# Patient Record
Sex: Female | Born: 1940
Health system: Southern US, Community
[De-identification: ages and names within clinical notes are randomized; demographics above are authoritative.]

## PROBLEM LIST (undated history)

## (undated) DIAGNOSIS — B259 Cytomegaloviral disease, unspecified: Secondary | ICD-10-CM

## (undated) DIAGNOSIS — K589 Irritable bowel syndrome without diarrhea: Secondary | ICD-10-CM

## (undated) DIAGNOSIS — K760 Fatty (change of) liver, not elsewhere classified: Secondary | ICD-10-CM

## (undated) DIAGNOSIS — E785 Hyperlipidemia, unspecified: Secondary | ICD-10-CM

## (undated) DIAGNOSIS — I499 Cardiac arrhythmia, unspecified: Secondary | ICD-10-CM

## (undated) DIAGNOSIS — K635 Polyp of colon: Secondary | ICD-10-CM

## (undated) DIAGNOSIS — E119 Type 2 diabetes mellitus without complications: Secondary | ICD-10-CM

## (undated) DIAGNOSIS — C4491 Basal cell carcinoma of skin, unspecified: Secondary | ICD-10-CM

## (undated) DIAGNOSIS — K859 Acute pancreatitis without necrosis or infection, unspecified: Secondary | ICD-10-CM

## (undated) DIAGNOSIS — K649 Unspecified hemorrhoids: Secondary | ICD-10-CM

## (undated) DIAGNOSIS — M810 Age-related osteoporosis without current pathological fracture: Secondary | ICD-10-CM

## (undated) DIAGNOSIS — Z83719 Family history of colon polyps, unspecified: Secondary | ICD-10-CM

## (undated) DIAGNOSIS — C801 Malignant (primary) neoplasm, unspecified: Secondary | ICD-10-CM

## (undated) DIAGNOSIS — Z8371 Family history of colonic polyps: Secondary | ICD-10-CM

## (undated) DIAGNOSIS — I10 Essential (primary) hypertension: Secondary | ICD-10-CM

## (undated) DIAGNOSIS — R011 Cardiac murmur, unspecified: Secondary | ICD-10-CM

## (undated) DIAGNOSIS — K2980 Duodenitis without bleeding: Secondary | ICD-10-CM

## (undated) DIAGNOSIS — K219 Gastro-esophageal reflux disease without esophagitis: Secondary | ICD-10-CM

## (undated) DIAGNOSIS — M1990 Unspecified osteoarthritis, unspecified site: Secondary | ICD-10-CM

## (undated) HISTORY — DX: Gastro-esophageal reflux disease without esophagitis: K21.9

## (undated) HISTORY — DX: Family history of colon polyps, unspecified: Z83.719

## (undated) HISTORY — PX: COLONOSCOPY: SHX174

## (undated) HISTORY — PX: ABDOMINAL HYSTERECTOMY: SHX81

## (undated) HISTORY — DX: Type 2 diabetes mellitus without complications: E11.9

## (undated) HISTORY — DX: Family history of colonic polyps: Z83.71

## (undated) HISTORY — DX: Irritable bowel syndrome, unspecified: K58.9

## (undated) HISTORY — PX: EYE SURGERY: SHX253

## (undated) HISTORY — DX: Hyperlipidemia, unspecified: E78.5

## (undated) HISTORY — PX: ESOPHAGOGASTRODUODENOSCOPY: SHX1529

## (undated) HISTORY — PX: OTHER SURGICAL HISTORY: SHX169

## (undated) HISTORY — DX: Basal cell carcinoma of skin, unspecified: C44.91

---

## 1976-11-10 HISTORY — PX: TUBAL LIGATION: SHX77

## 1978-11-10 HISTORY — PX: OTHER SURGICAL HISTORY: SHX169

## 1986-10-10 HISTORY — PX: OTHER SURGICAL HISTORY: SHX169

## 2004-12-20 ENCOUNTER — Ambulatory Visit: Payer: Self-pay | Admitting: Internal Medicine

## 2005-02-11 ENCOUNTER — Ambulatory Visit: Payer: Self-pay | Admitting: Internal Medicine

## 2005-02-17 ENCOUNTER — Ambulatory Visit: Payer: Self-pay | Admitting: Internal Medicine

## 2005-06-19 ENCOUNTER — Ambulatory Visit: Payer: Self-pay | Admitting: Internal Medicine

## 2005-10-21 ENCOUNTER — Ambulatory Visit: Payer: Self-pay | Admitting: Gastroenterology

## 2006-01-28 ENCOUNTER — Ambulatory Visit: Payer: Self-pay | Admitting: Internal Medicine

## 2006-02-18 ENCOUNTER — Ambulatory Visit: Payer: Self-pay | Admitting: Internal Medicine

## 2006-07-31 ENCOUNTER — Ambulatory Visit: Payer: Self-pay | Admitting: Internal Medicine

## 2006-09-15 ENCOUNTER — Ambulatory Visit: Payer: Self-pay | Admitting: Internal Medicine

## 2006-12-30 ENCOUNTER — Ambulatory Visit: Payer: Self-pay | Admitting: Internal Medicine

## 2006-12-31 LAB — CONVERTED CEMR LAB: Hgb A1c MFr Bld: 6.4 % — ABNORMAL HIGH (ref 4.6–6.0)

## 2007-01-09 HISTORY — PX: OTHER SURGICAL HISTORY: SHX169

## 2007-01-26 ENCOUNTER — Ambulatory Visit: Payer: Self-pay | Admitting: Internal Medicine

## 2007-01-26 LAB — CONVERTED CEMR LAB
Albumin: 4.1 g/dL (ref 3.5–5.2)
BUN: 12 mg/dL (ref 6–23)
Creatinine,U: 137.4 mg/dL
GFR calc non Af Amer: 89 mL/min
LDL Cholesterol: 90 mg/dL (ref 0–99)
Phosphorus: 4 mg/dL (ref 2.3–4.6)
Potassium: 3.9 meq/L (ref 3.5–5.1)
Total CHOL/HDL Ratio: 3.4

## 2007-01-27 ENCOUNTER — Ambulatory Visit: Payer: Self-pay

## 2007-02-24 ENCOUNTER — Ambulatory Visit: Payer: Self-pay | Admitting: Internal Medicine

## 2007-07-20 DIAGNOSIS — E119 Type 2 diabetes mellitus without complications: Secondary | ICD-10-CM | POA: Insufficient documentation

## 2007-07-20 DIAGNOSIS — E785 Hyperlipidemia, unspecified: Secondary | ICD-10-CM | POA: Insufficient documentation

## 2007-07-20 DIAGNOSIS — K589 Irritable bowel syndrome without diarrhea: Secondary | ICD-10-CM | POA: Insufficient documentation

## 2007-07-29 ENCOUNTER — Ambulatory Visit: Payer: Self-pay | Admitting: Internal Medicine

## 2007-07-29 LAB — CONVERTED CEMR LAB
Albumin: 4.2 g/dL (ref 3.5–5.2)
GFR calc Af Amer: 129 mL/min
GFR calc non Af Amer: 106 mL/min
LDL Cholesterol: 90 mg/dL (ref 0–99)
Phosphorus: 5.4 mg/dL — ABNORMAL HIGH (ref 2.3–4.6)
Sodium: 141 meq/L (ref 135–145)
Total CHOL/HDL Ratio: 3.3
Triglycerides: 142 mg/dL (ref 0–149)
VLDL: 28 mg/dL (ref 0–40)

## 2007-09-10 ENCOUNTER — Ambulatory Visit: Payer: Self-pay | Admitting: Internal Medicine

## 2007-12-14 ENCOUNTER — Ambulatory Visit: Payer: Self-pay | Admitting: Internal Medicine

## 2007-12-14 DIAGNOSIS — R002 Palpitations: Secondary | ICD-10-CM | POA: Insufficient documentation

## 2007-12-27 ENCOUNTER — Ambulatory Visit: Payer: Self-pay

## 2007-12-28 ENCOUNTER — Encounter: Payer: Self-pay | Admitting: Internal Medicine

## 2008-02-03 ENCOUNTER — Ambulatory Visit: Payer: Self-pay | Admitting: Internal Medicine

## 2008-02-04 LAB — CONVERTED CEMR LAB
AST: 32 units/L (ref 0–37)
Albumin: 4.3 g/dL (ref 3.5–5.2)
Alkaline Phosphatase: 56 units/L (ref 39–117)
BUN: 12 mg/dL (ref 6–23)
Basophils Absolute: 0.1 10*3/uL (ref 0.0–0.1)
Basophils Relative: 0.6 % (ref 0.0–1.0)
Bilirubin, Direct: 0.1 mg/dL (ref 0.0–0.3)
CO2: 31 meq/L (ref 19–32)
Calcium: 9.9 mg/dL (ref 8.4–10.5)
Cholesterol: 179 mg/dL (ref 0–200)
Creatinine, Ser: 0.7 mg/dL (ref 0.4–1.2)
Glucose, Bld: 186 mg/dL — ABNORMAL HIGH (ref 70–99)
LDL Cholesterol: 89 mg/dL (ref 0–99)
Lymphocytes Relative: 22.6 % (ref 12.0–46.0)
MCHC: 34.5 g/dL (ref 30.0–36.0)
Neutrophils Relative %: 68.1 % (ref 43.0–77.0)
RBC: 4.99 M/uL (ref 3.87–5.11)
Total Bilirubin: 1 mg/dL (ref 0.3–1.2)
VLDL: 39 mg/dL (ref 0–40)

## 2008-02-17 ENCOUNTER — Ambulatory Visit: Payer: Self-pay | Admitting: Internal Medicine

## 2008-02-25 ENCOUNTER — Encounter: Payer: Self-pay | Admitting: Internal Medicine

## 2008-07-10 ENCOUNTER — Ambulatory Visit: Payer: Self-pay | Admitting: Internal Medicine

## 2008-09-08 ENCOUNTER — Ambulatory Visit: Payer: Self-pay | Admitting: Internal Medicine

## 2008-09-08 DIAGNOSIS — K219 Gastro-esophageal reflux disease without esophagitis: Secondary | ICD-10-CM | POA: Insufficient documentation

## 2008-09-12 LAB — CONVERTED CEMR LAB
ALT: 40 units/L — ABNORMAL HIGH (ref 0–35)
AST: 44 units/L — ABNORMAL HIGH (ref 0–37)
Albumin: 4.4 g/dL (ref 3.5–5.2)
Alkaline Phosphatase: 57 units/L (ref 39–117)
BUN: 11 mg/dL (ref 6–23)
Bilirubin, Direct: 0.1 mg/dL (ref 0.0–0.3)
CO2: 31 meq/L (ref 19–32)
Calcium: 9.9 mg/dL (ref 8.4–10.5)
Chloride: 100 meq/L (ref 96–112)
Cholesterol: 171 mg/dL (ref 0–200)
Creatinine, Ser: 0.6 mg/dL (ref 0.4–1.2)
Creatinine,U: 40.2 mg/dL
GFR calc Af Amer: 128 mL/min
GFR calc non Af Amer: 106 mL/min
Glucose, Bld: 182 mg/dL — ABNORMAL HIGH (ref 70–99)
HDL: 47.6 mg/dL (ref >39.0)
Hgb A1c MFr Bld: 8.8 % — ABNORMAL HIGH (ref 4.6–6.0)
LDL Cholesterol: 90 mg/dL (ref 0–99)
Microalb Creat Ratio: 14.9 mg/g (ref 0.0–30.0)
Microalb, Ur: 0.6 mg/dL (ref 0.0–1.9)
Phosphorus: 5.3 mg/dL — ABNORMAL HIGH (ref 2.3–4.6)
Potassium: 4.2 meq/L (ref 3.5–5.1)
Sodium: 141 meq/L (ref 135–145)
Total Bilirubin: 1.1 mg/dL (ref 0.3–1.2)
Total CHOL/HDL Ratio: 3.6
Total Protein: 7.3 g/dL (ref 6.0–8.3)
Triglycerides: 168 mg/dL — ABNORMAL HIGH (ref 0–149)
VLDL: 34 mg/dL (ref 0–40)

## 2008-09-25 ENCOUNTER — Ambulatory Visit: Payer: Self-pay | Admitting: Internal Medicine

## 2009-02-12 ENCOUNTER — Ambulatory Visit: Payer: Self-pay | Admitting: Internal Medicine

## 2009-02-14 LAB — CONVERTED CEMR LAB
ALT: 16 units/L (ref 0–35)
BUN: 17 mg/dL (ref 6–23)
Basophils Absolute: 0 10*3/uL (ref 0.0–0.1)
Bilirubin, Direct: 0.1 mg/dL (ref 0.0–0.3)
CO2: 30 meq/L (ref 19–32)
Eosinophils Absolute: 0.2 10*3/uL (ref 0.0–0.7)
GFR calc non Af Amer: 88.5 mL/min (ref >60)
Glucose, Bld: 96 mg/dL (ref 70–99)
HCT: 44.4 % (ref 36.0–46.0)
Hemoglobin: 15.7 g/dL — ABNORMAL HIGH (ref 12.0–15.0)
Hgb A1c MFr Bld: 6.1 % (ref 4.6–6.5)
Lymphs Abs: 2.4 10*3/uL (ref 0.7–4.0)
MCHC: 35.3 g/dL (ref 30.0–36.0)
MCV: 92.4 fL (ref 78.0–100.0)
Monocytes Absolute: 0.7 10*3/uL (ref 0.1–1.0)
Monocytes Relative: 8.5 % (ref 3.0–12.0)
Neutro Abs: 5.4 10*3/uL (ref 1.4–7.7)
Platelets: 251 10*3/uL (ref 150.0–400.0)
Potassium: 4.8 meq/L (ref 3.5–5.1)
RDW: 12.8 % (ref 11.5–14.6)
Sodium: 141 meq/L (ref 135–145)
Total Bilirubin: 0.9 mg/dL (ref 0.3–1.2)
Total CHOL/HDL Ratio: 3
VLDL: 22.6 mg/dL (ref 0.0–40.0)

## 2009-03-01 ENCOUNTER — Encounter: Payer: Self-pay | Admitting: Internal Medicine

## 2009-03-07 ENCOUNTER — Encounter: Payer: Self-pay | Admitting: Internal Medicine

## 2009-04-03 ENCOUNTER — Ambulatory Visit: Payer: Self-pay | Admitting: Family Medicine

## 2009-04-03 DIAGNOSIS — M109 Gout, unspecified: Secondary | ICD-10-CM | POA: Insufficient documentation

## 2009-04-23 ENCOUNTER — Telehealth: Payer: Self-pay | Admitting: Family Medicine

## 2009-05-04 ENCOUNTER — Telehealth (INDEPENDENT_AMBULATORY_CARE_PROVIDER_SITE_OTHER): Payer: Self-pay | Admitting: Internal Medicine

## 2009-05-16 ENCOUNTER — Telehealth (INDEPENDENT_AMBULATORY_CARE_PROVIDER_SITE_OTHER): Payer: Self-pay | Admitting: Internal Medicine

## 2009-08-15 ENCOUNTER — Ambulatory Visit: Payer: Self-pay | Admitting: Internal Medicine

## 2009-08-16 LAB — CONVERTED CEMR LAB
Alkaline Phosphatase: 48 units/L (ref 39–117)
BUN: 14 mg/dL (ref 6–23)
Basophils Absolute: 0 10*3/uL (ref 0.0–0.1)
Bilirubin, Direct: 0 mg/dL (ref 0.0–0.3)
CO2: 27 meq/L (ref 19–32)
Chloride: 98 meq/L (ref 96–112)
Cholesterol: 157 mg/dL (ref 0–200)
Creatinine, Ser: 0.6 mg/dL (ref 0.4–1.2)
Eosinophils Relative: 1.8 % (ref 0.0–5.0)
GFR calc non Af Amer: 105.58 mL/min (ref >60)
HCT: 46.6 % — ABNORMAL HIGH (ref 36.0–46.0)
Hemoglobin: 16 g/dL — ABNORMAL HIGH (ref 12.0–15.0)
LDL Cholesterol: 73 mg/dL (ref 0–99)
Lymphs Abs: 2.4 10*3/uL (ref 0.7–4.0)
MCV: 92.4 fL (ref 78.0–100.0)
Monocytes Absolute: 0.7 10*3/uL (ref 0.1–1.0)
Monocytes Relative: 8.1 % (ref 3.0–12.0)
Neutro Abs: 5.7 10*3/uL (ref 1.4–7.7)
Platelets: 248 10*3/uL (ref 150.0–400.0)
Potassium: 4.3 meq/L (ref 3.5–5.1)
RDW: 12.7 % (ref 11.5–14.6)
Total Bilirubin: 0.7 mg/dL (ref 0.3–1.2)
Total CHOL/HDL Ratio: 3
Total Protein: 7.4 g/dL (ref 6.0–8.3)
Uric Acid, Serum: 6.5 mg/dL (ref 2.4–7.0)
VLDL: 33.8 mg/dL (ref 0.0–40.0)

## 2009-08-30 ENCOUNTER — Ambulatory Visit: Payer: Self-pay | Admitting: Internal Medicine

## 2009-08-30 DIAGNOSIS — J019 Acute sinusitis, unspecified: Secondary | ICD-10-CM | POA: Insufficient documentation

## 2009-08-30 DIAGNOSIS — G479 Sleep disorder, unspecified: Secondary | ICD-10-CM | POA: Insufficient documentation

## 2009-12-05 ENCOUNTER — Ambulatory Visit: Payer: Self-pay | Admitting: Family Medicine

## 2010-02-05 ENCOUNTER — Telehealth: Payer: Self-pay | Admitting: Family Medicine

## 2010-03-26 ENCOUNTER — Telehealth: Payer: Self-pay | Admitting: Family Medicine

## 2010-03-26 ENCOUNTER — Ambulatory Visit: Payer: Self-pay | Admitting: Family Medicine

## 2010-03-29 ENCOUNTER — Encounter: Payer: Self-pay | Admitting: Family Medicine

## 2010-04-10 ENCOUNTER — Encounter: Payer: Self-pay | Admitting: Family Medicine

## 2010-04-26 ENCOUNTER — Telehealth: Payer: Self-pay | Admitting: Family Medicine

## 2010-05-24 ENCOUNTER — Telehealth: Payer: Self-pay | Admitting: Family Medicine

## 2010-06-24 ENCOUNTER — Telehealth: Payer: Self-pay | Admitting: Family Medicine

## 2010-07-04 ENCOUNTER — Ambulatory Visit: Payer: Self-pay | Admitting: Internal Medicine

## 2010-07-05 ENCOUNTER — Ambulatory Visit: Payer: Self-pay | Admitting: Family Medicine

## 2010-07-22 ENCOUNTER — Telehealth: Payer: Self-pay | Admitting: Family Medicine

## 2010-07-24 ENCOUNTER — Ambulatory Visit: Payer: Self-pay | Admitting: Family Medicine

## 2010-07-29 ENCOUNTER — Telehealth: Payer: Self-pay | Admitting: Family Medicine

## 2010-08-21 ENCOUNTER — Telehealth: Payer: Self-pay | Admitting: Family Medicine

## 2010-09-23 ENCOUNTER — Telehealth: Payer: Self-pay | Admitting: Family Medicine

## 2010-10-01 ENCOUNTER — Ambulatory Visit: Payer: Self-pay | Admitting: Family Medicine

## 2010-10-01 DIAGNOSIS — M546 Pain in thoracic spine: Secondary | ICD-10-CM | POA: Insufficient documentation

## 2010-10-01 DIAGNOSIS — G8929 Other chronic pain: Secondary | ICD-10-CM | POA: Insufficient documentation

## 2010-10-16 ENCOUNTER — Telehealth: Payer: Self-pay | Admitting: Internal Medicine

## 2010-10-29 ENCOUNTER — Telehealth: Payer: Self-pay | Admitting: Family Medicine

## 2010-11-14 ENCOUNTER — Encounter: Payer: Self-pay | Admitting: Family Medicine

## 2010-11-26 ENCOUNTER — Telehealth: Payer: Self-pay | Admitting: Family Medicine

## 2010-11-28 ENCOUNTER — Telehealth: Payer: Self-pay | Admitting: Family Medicine

## 2010-12-10 NOTE — Assessment & Plan Note (Signed)
Summary: END OF NOV FOLLOW UP/[facility]   Vital Signs:  Patient profile:   70 year old female Height:      67 inches Weight:      180 pounds BMI:     28.29 Temp:     97.6 degrees F oral Pulse rate:   64 / minute Pulse rhythm:   regular BP sitting:   140 / 62  (right arm) Cuff size:   regular  Vitals Entered By: Linde Gillis CMA Duncan Dull) (October 01, 2010 9:46 AM) CC: follow up   History of Present Illness: 70 yo here to follow DM.    DM- CBGs have been much better..  has really been working on diet and exercise, going to the gym up to five times a week now.  a1c in August was 7.4 (improved from 7.8).  Fasting has not been above 120 lately.  Taking Metformin 1000 mg two times a day and Glipizide 10 mg daily ( increased from 5).  Denies any episodes of hypoglycemia.   Going to the gym with her daughter every day now until 3 weeks ago when shoulder blade started hurting. Pain is in mid upper back- radiates toward left shoulder blade.  Worse with movement. No problem lifting arm over head, no muscle weakness or radiculopathy. Unsure when last bone density scan was. No trauma.    Current Medications (verified): 1)  Lovastatin 40 Mg  Tabs (Lovastatin) .... Take One By Mouth Once A Day 2)  Multivitamins   Tabs (Multiple Vitamin) .... Take One By Mouth Once A Day 3)  Prilosec Otc 20 Mg  Tbec (Omeprazole Magnesium) .... Take One By Mouth Once A Day As Needed 4)  Fish Oil Concentrate 1000 Mg  Caps (Omega-3 Fatty Acids) .... Take 3 By Mouth Once Daily 5)  Caltrate 600+d 600-400 Mg-Unit  Tabs (Calcium Carbonate-Vitamin D) .... Take 3 By Mouth Once Daily 6)  Metoprolol Succinate 100 Mg Xr24h-Tab (Metoprolol Succinate) .... Take 1 By Mouth Once Daily 7)  Accu-Chek Softclix Lancet Dev  Misc (Lancet Devices) .... Use As Directed 8)  Accu-Chek Compact  Strp (Glucose Blood) .... Use As Directed 9)  Trazodone Hcl 50 Mg Tabs (Trazodone Hcl) .Marland Kitchen.. 1-2 Tabs At Bedtime To Help Sleep 10)  Glipizide Xl 10  Mg Tb24 (Glipizide) .Marland Kitchen.. 1 Tablet By Mouth Daily  Allergies: 1)  ! * Prevacid 2)  ! Codeine 3)  Sulfa  Past History:  Past Medical History: Last updated: 09/08/2008 Colonic polyps, hx of--adenomatous Diabetes mellitus, type II Hyperlipidemia Osteoporosis IBS GERD  Past Surgical History: Last updated: 08/17/07 Hysterectomy 1980 Tubal ligation 1978 Vaginal deliveries x 2 Cystocele, cystourethropexy 12/87 DEXA- increase T - 3.1 spine, normal femur 01/04 Carotids- mild only 03/08  Family History: Last updated: 08-17-2007 Father: Died at age 44, CHF, CAD Mother: Died at age 52, CHF, CAD (MI at 52) Siblings: One brother deceased ?? 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  Regular Exercise - no Drug Use - no  Risk Factors: Exercise: no (10/12/2009)  Risk Factors: Smoking Status: quit (10/12/2009)  Review of Systems      See HPI General:  Denies malaise. Eyes:  Denies blurring. CV:  Denies chest pain or discomfort. Resp:  Denies shortness of breath. GI:  Denies abdominal  pain and change in bowel habits. MS:  Complains of thoracic pain; denies loss of strength, low back pain, and muscle weakness.  Physical Exam  General:  alert.  NAD Mouth:  good dentition.   Lungs:  normal respiratory effort, normal breath sounds, no crackles, and no wheezes.   Heart:  normal rate, regular rhythm, no murmur, and no gallop.   Msk:  neg empty can. neg arch,left Psych:  normally interactive, good eye contact, not anxious appearing, and not depressed appearing.     Detailed Back/Spine Exam  Cervical Exam:  Inspection-deformity:    Normal Palpation-spinal tenderness:  Normal Range of Motion:    Forward Flexion:   60 degrees    Hyperextension:   75  degrees    Right Lat. Flexion:   45 degrees    Left Lat. Flexion:   45 degrees    Right Lat. Rotation:   80 degrees    Left Lat. Rotation:   80 degrees Spurling Maneuver:    negative Hoffman's Sign:    Right:  negative    Left:  negative  Thoracic Exam:  Inspection-deformity:    Normal Palpation-spinal tenderness:  Abnormal    some Tenderness of mid thoracic vertebra Sensory Exam/Pinprick:    Right:       T1:       normal       T2:       normal       T3:       normal       T4:       normal       T5:       normal       T6:       normal       T7:       normal       T8:       normal       T9:       normal       T10:      normal       T11:      normal       T12:      normal    Left:       T1:       normal       T2:       normal       T3:       normal       T4:       normal       T5:       normal       T6:       normal       T7:       normal       T8:       normal       T9:       normal       T10:      normal       T11:      normal       T12:      normal   Impression & Recommendations:  Problem # 1:  DIABETES MELLITUS, TYPE II (ICD-250.00) Assessment Unchanged recheck a1c today. The following medications were removed from the medication list:    Glucophage 1000 Mg Tabs (Metformin hcl) .Marland Kitchen... Take one by mouth two times a day Her updated medication list  for this problem includes:    Glipizide Xl 10 Mg Tb24 (Glipizide) .Marland Kitchen... 1 tablet by mouth daily    Metformin Hcl 1000 Mg Tabs (Metformin hcl) .Marland Kitchen... Take 1 tablet by mouth two times a day  Orders: Fingerstick (36416) TLB-A1C / Hgb A1C (Glycohemoglobin) (83036-A1C) Prescription Created Electronically [id number])  Problem # 2:  BACK PAIN, THORACIC REGION, LEFT (ICD-724.1) Assessment: New ?possible compression fracture.  Will get thoracic films today, if she does have a fracture, will get bone density scan. Orders: T-Thoracic Spine 2 Views [id number])  Complete Medication List: 1)  Lovastatin 40 Mg Tabs (Lovastatin)  .... Take one by mouth once a day 2)  Multivitamins Tabs (Multiple vitamin) .... Take one by mouth once a day 3)  Prilosec Otc 20 Mg Tbec (Omeprazole magnesium) .... Take one by mouth once a day as needed 4)  Fish Oil Concentrate 1000 Mg Caps (Omega-3 fatty acids) .... Take 3 by mouth once daily 5)  Caltrate 600+d 600-400 Mg-unit Tabs (Calcium carbonate-vitamin d) .... Take 3 by mouth once daily 6)  Metoprolol Succinate 100 Mg Xr24h-tab (Metoprolol succinate) .... Take 1 by mouth once daily 7)  Accu-chek Softclix Lancet Dev Misc (Lancet devices) .... Use as directed 8)  Accu-chek Compact Strp (Glucose blood) .... Use as directed 9)  Trazodone Hcl 50 Mg Tabs (Trazodone hcl) .Marland Kitchen.. 1-2 tabs at bedtime to help sleep 10)  Glipizide Xl 10 Mg Tb24 (Glipizide) .Marland Kitchen.. 1 tablet by mouth daily 11)  Metformin Hcl 1000 Mg Tabs (Metformin hcl) .... Take 1 tablet by mouth two times a day Prescriptions: METFORMIN HCL 1000 MG TABS (METFORMIN HCL) Take 1 tablet by mouth two times a day  #180 x 3   Entered and Authorized by:   Ruthe Mannan MD   Signed by:   Ruthe Mannan MD on 10/01/2010   Method used:   Electronically to        [facility]. [address]* (retail)       [address]       [place], [place]  [address]       Ph: [phone]       Fax: [phone]   RxID:   [id number]    Orders Added: 1)  Fingerstick [36416] 2)  TLB-A1C / Hgb A1C (Glycohemoglobin) [83036-A1C] 3)  T-Thoracic Spine 2 Views [72070TC] 4)  Prescription Created Electronically [[id number]] 5)  Est. Patient Level IV [56387]    Current Allergies (reviewed today): ! * PREVACID ! CODEINE SULFA

## 2010-12-10 NOTE — Progress Notes (Signed)
Summary: prior Berkley Harvey is needed for name brand glucophage  Phone Note From Pharmacy   Caller: [facility] [address]. #[id number]*/ [facility] Summary of Call: Prior Berkley Harvey is needed for name brand glucophage, form is on your desk. Initial call taken by: Lowella Petties CMA,  July 29, 2010 10:27 AM  Follow-up for Phone Call        In my box. Ruthe Mannan MD  July 29, 2010 10:31 AM   Additional Follow-up for Phone Call Additional follow up Details #1::        Form faxed. Additional Follow-up by: Lowella Petties CMA,  July 29, 2010 11:17 AM     Appended Document: prior Berkley Harvey is needed for name brand glucophage Prior auth given for glucophage, approval letter placed on doctors desk for signature and scanning.

## 2010-12-10 NOTE — Progress Notes (Signed)
Summary: Trazodone  Phone Note Refill Request Message from:  [facility] on April 26, 2010 8:19 AM  Refills Requested: Medication #1:  TRAZODONE HCL 50 MG TABS 1-2 tabs at bedtime to help sleep [facility] [address] #[id number]*   Last Fill Date:  03/26/2010   Pharmacy Phone:  [phone]   Method Requested: Telephone to Pharmacy Initial call taken by: Delilah Shan CMA (AAMA),  April 26, 2010 8:20 AM    Prescriptions: TRAZODONE HCL 50 MG TABS (TRAZODONE HCL) 1-2 tabs at bedtime to help sleep  #60.0 Each x 0   Entered and Authorized by:   Ruthe Mannan MD   Signed by:   Ruthe Mannan MD on 04/26/2010   Method used:   Electronically to        [facility]. [address] [phone]* (retail)       [address], [place]  [address]       Ph: [phone]       Fax: [phone]   RxID:   [id number]

## 2010-12-10 NOTE — Progress Notes (Signed)
Summary: Rx Trazodone  Phone Note Refill Request Call back at [phone] Message from:  [facility] on May 24, 2010 8:04 AM  Refills Requested: Medication #1:  TRAZODONE HCL 50 MG TABS 1-2 tabs at bedtime to help sleep   Last Refilled: 04/26/2010 Received E-script request please advise.   Method Requested: Telephone to Pharmacy Initial call taken by: Linde Gillis CMA Duncan Dull),  May 24, 2010 8:05 AM    Prescriptions: TRAZODONE HCL 50 MG TABS (TRAZODONE HCL) 1-2 tabs at bedtime to help sleep  #60.0 Each x 0   Entered and Authorized by:   Ruthe Mannan MD   Signed by:   Ruthe Mannan MD on 05/24/2010   Method used:   Electronically to        [facility]. [address] [phone]* (retail)       [address] [place]  [address]       Ph: [phone]       Fax: [phone]   RxID:   [id number]

## 2010-12-10 NOTE — Progress Notes (Signed)
Summary: needs order for mammogram  Phone Note Call from Patient Call back at Home Phone [phone]   Caller: Patient Summary of Call: Pt needs order for mammogram, last one was at [facility] 4/10. Initial call taken by: Lowella Petties CMA,  Mar 26, 2010 12:49 PM

## 2010-12-10 NOTE — Letter (Signed)
Summary: Generic Letter  [place] [facility]  [address] [place], [place] [address]   Phone: [phone]  Fax: [phone]    03/29/2010  Molly [name] [address] [place], [place]  [address]  Dear [name],    We have been unable to reach you by telephone.  Please call our office when you receive this letter and ask to speak with Dr. Elmer Sow nurse.            Sincerely,   Ruthe Mannan, MD

## 2010-12-10 NOTE — Assessment & Plan Note (Signed)
Summary: F/U PER DR ARON/CLE   Vital Signs:  Patient profile:   70 year old female Height:      67 inches Weight:      183 pounds BMI:     28.77 Temp:     97.6 degrees F oral Pulse rate:   67 / minute Pulse rhythm:   regular BP sitting:   124 / 60  (right arm) Cuff size:   regular  Vitals Entered By: Linde Gillis CMA Duncan Dull) (July 24, 2010 11:51 AM) CC: follow-up visit per Dr. Dayton Martes   History of Present Illness: 70 yo here to follow DM.    DM- CBGs at home have not been well controlled, checks once daily.  has really been working on diet and exercise, going to the gym up to five times a week now.  a1c last month was 7.4 (improved from 7.8).  Fasting has been as high as 200 lately.  Taking Glucophage 1000 mg two times a day and Glipizide 10 mg daily ( increased from 5).  Denies any episodes of hypoglycemia.  Last a1c in emr was in 10/10 was 7.0 (6.1 prior to that).   Current Medications (verified): 1)  Lovastatin 40 Mg  Tabs (Lovastatin) .... Take One By Mouth Once A Day 2)  Glucophage 1000 Mg  Tabs (Metformin Hcl) .... Take One By Mouth Two Times A Day 3)  Multivitamins   Tabs (Multiple Vitamin) .... Take One By Mouth Once A Day 4)  Prilosec Otc 20 Mg  Tbec (Omeprazole Magnesium) .... Take One By Mouth Once A Day As Needed 5)  Fish Oil Concentrate 1000 Mg  Caps (Omega-3 Fatty Acids) .... Take 3 By Mouth Once Daily 6)  Caltrate 600+d 600-400 Mg-Unit  Tabs (Calcium Carbonate-Vitamin D) .... Take 3 By Mouth Once Daily 7)  Metoprolol Succinate 100 Mg Xr24h-Tab (Metoprolol Succinate) .... Take 1 By Mouth Once Daily 8)  Accu-Chek Softclix Lancet Dev  Misc (Lancet Devices) .... Use As Directed 9)  Accu-Chek Compact  Strp (Glucose Blood) .... Use As Directed 10)  Trazodone Hcl 50 Mg Tabs (Trazodone Hcl) .Marland Kitchen.. 1-2 Tabs At Bedtime To Help Sleep 11)  Glipizide Xl 10 Mg Tb24 (Glipizide) .Marland Kitchen.. 1 Tablet By Mouth Daily  Allergies: 1)  ! * Prevacid 2)  ! Codeine 3)  Sulfa  Past  History:  Past Medical History: Last updated: 09/08/2008 Colonic polyps, hx of--adenomatous Diabetes mellitus, type II Hyperlipidemia Osteoporosis IBS GERD  Past Surgical History: Last updated: 08/06/2007 Hysterectomy 1980 Tubal ligation 1978 Vaginal deliveries x 2 Cystocele, cystourethropexy 12/87 DEXA- increase T - 3.1 spine, normal femur 01/04 Carotids- mild only 03/08  Family History: Last updated: 08-06-2007 Father: Died at age 46, CHF, CAD Mother: Died at age 65, CHF, CAD (MI at 66) Siblings: One brother deceased ?? 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  Regular Exercise - no Drug Use - no  Risk Factors: Exercise: no (10/12/2009)  Risk Factors: Smoking Status: quit (10/12/2009)  Review of Systems      See HPI General:  Denies malaise. Eyes:  Denies blurring. GI:  Denies nausea and vomiting. Endo:  Denies excessive thirst, excessive urination, and polyuria.  Physical Exam  General:  alert.  NAD Psych:  normally interactive, good eye contact, not anxious  appearing, and not depressed appearing.     Impression & Recommendations:  Problem # 1:  DIABETES MELLITUS, TYPE II (ICD-250.00) Assessment Unchanged  Time spent with patient 25 minutes, more than 50% of this time was spent counseling patient on didabetes.  Next step would be adding insulin but pt does not want to do that yet.  She wants to try trade name Glucophage and continue higher dose Glipizide with life style changes.  Will follow up in 2 months.    Her updated medication list for this problem includes:    Glucophage 1000 Mg Tabs (Metformin hcl) .Marland Kitchen... Take one by mouth two times a day    Glipizide Xl 10 Mg Tb24 (Glipizide) .Marland Kitchen... 1 tablet by mouth  daily  Orders: Prescription Created Electronically ([id number])  Complete Medication List: 1)  Lovastatin 40 Mg Tabs (Lovastatin) .... Take one by mouth once a day 2)  Glucophage 1000 Mg Tabs (Metformin hcl) .... Take one by mouth two times a day 3)  Multivitamins Tabs (Multiple vitamin) .... Take one by mouth once a day 4)  Prilosec Otc 20 Mg Tbec (Omeprazole magnesium) .... Take one by mouth once a day as needed 5)  Fish Oil Concentrate 1000 Mg Caps (Omega-3 fatty acids) .... Take 3 by mouth once daily 6)  Caltrate 600+d 600-400 Mg-unit Tabs (Calcium carbonate-vitamin d) .... Take 3 by mouth once daily 7)  Metoprolol Succinate 100 Mg Xr24h-tab (Metoprolol succinate) .... Take 1 by mouth once daily 8)  Accu-chek Softclix Lancet Dev Misc (Lancet devices) .... Use as directed 9)  Accu-chek Compact Strp (Glucose blood) .... Use as directed 10)  Trazodone Hcl 50 Mg Tabs (Trazodone hcl) .Marland Kitchen.. 1-2 tabs at bedtime to help sleep 11)  Glipizide Xl 10 Mg Tb24 (Glipizide) .Marland Kitchen.. 1 tablet by mouth daily  Patient Instructions: 1)  please come see me at end of November.   Prescriptions: GLUCOPHAGE 1000 MG  TABS (METFORMIN HCL) Take one by mouth two times a day  #60.0 Each x 11   Entered and Authorized by:   Ruthe Mannan MD   Signed by:   Ruthe Mannan MD on 07/24/2010   Method used:   Electronically to        [facility]. [address] [phone]* (retail)       [address] [place]  [address]       Ph: [phone]       Fax: [phone]   RxID:   [id number]   Current Allergies (reviewed today): ! * PREVACID ! CODEINE SULFA

## 2010-12-10 NOTE — Assessment & Plan Note (Signed)
Summary: 30 MIN F/U/CLE   Vital Signs:  Patient profile:   70 year old female Height:      67 inches Weight:      188.50 pounds BMI:     29.63 Temp:     98.0 degrees F oral Pulse rate:   76 / minute Pulse rhythm:   regular BP sitting:   140 / 70  (left arm) Cuff size:   regular  Vitals Entered By: Linde Gillis CMA Duncan Dull) (Mar 26, 2010 9:12 AM) CC: 30 minute follow up   History of Present Illness: 70 yo here to establish care with me Molly [name] pt).  DM- CBGs at home have not been well controlled, checks once daily.  Fasting has been as high as 200 lately.  Taking Glucophage 1000 mg two times a day and Glipizide 5 mg daily.  Denies any episodes of hypoglycemia.  Last a1c in emr was in 10/10 was 7.0 (6.1 prior to that). No increased stressors, feels like she is compliant with her diet. She and her daughter are joining the [employer]. Has lost 2 pounds since last office visit.  HTN- gets white coat HTN.  BP at home this morning was 128/72.  Prior BPs reviewed in emr.  She does have several normotensive readings as well. On metoprolol 100 mg daily.  No CP, SOB, LE edema, or blurred vision.  HLD- on lovastatin 40 mg with no issues.  Lipids well controlled at last check in 10/10.  Osteporosis- took fosamax for years, cannot remember when last DEXA scan was.  Taking Caltrate- 3 tabs daily.  Current Medications (verified): 1)  Lovastatin 40 Mg  Tabs (Lovastatin) .... Take One By Mouth Once A Day 2)  Glucophage 1000 Mg  Tabs (Metformin Hcl) .... Take One By Mouth Two Times A Day 3)  Multivitamins   Tabs (Multiple Vitamin) .... Take One By Mouth Once A Day 4)  Prilosec Otc 20 Mg  Tbec (Omeprazole Magnesium) .... Take One By Mouth Once A Day As Needed 5)  Fish Oil Concentrate 1000 Mg  Caps (Omega-3 Fatty Acids) .... Take 3 By Mouth Once Daily 6)  Caltrate 600+d 600-400 Mg-Unit  Tabs (Calcium Carbonate-Vitamin D) .... Take 3 By Mouth Once Daily 7)  Metoprolol Succinate 100 Mg Xr24h-Tab  (Metoprolol Succinate) .... Take 1 By Mouth Once Daily 8)  Glipizide 5 Mg Xr24h-Tab (Glipizide) .... Take 1 By Mouth Once Daily 9)  Accu-Chek Softclix Lancet Dev  Misc (Lancet Devices) .... Use As Directed 10)  Accu-Chek Compact  Strp (Glucose Blood) .... Use As Directed 11)  Trazodone Hcl 50 Mg Tabs (Trazodone Hcl) .Marland Kitchen.. 1-2 Tabs At Bedtime To Help Sleep  Allergies: 1)  ! * Prevacid 2)  ! Codeine 3)  Sulfa  Review of Systems      See HPI General:  Denies malaise. Eyes:  Denies discharge. ENT:  Denies difficulty swallowing. CV:  Denies chest pain or discomfort and difficulty breathing at night. Resp:  Denies shortness of breath. GI:  Denies abdominal pain and change in bowel habits. GU:  Denies incontinence and nocturia. MS:  Denies loss of strength. Derm:  Denies rash. Psych:  Denies anxiety and depression. Endo:  Denies cold intolerance, excessive hunger, excessive thirst, excessive urination, heat intolerance, and polyuria.  Physical Exam  General:  alert.  NAD Head:  normocephalic and atraumatic.   Mouth:  good dentition.   Lungs:  normal respiratory effort, normal breath sounds, no crackles, and no wheezes.   Heart:  normal  rate, regular rhythm, no murmur, and no gallop.   Abdomen:  soft and non-tender.   Extremities:  no edema Neurologic:  alert & oriented X3.   Psych:  normally interactive, good eye contact, not anxious appearing, and not depressed appearing.     Impression & Recommendations:  Problem # 1:  DIABETES MELLITUS, TYPE II (ICD-250.00) Assessment Deteriorated recheck a1c today.  Making good lifestyle changes, may need to increase change meds if a1c deteriorated. Her updated medication list for this problem includes:    Glucophage 1000 Mg Tabs (Metformin hcl) .Marland Kitchen... Take one by mouth two times a day    Glipizide 5 Mg Xr24h-tab (Glipizide) .Marland Kitchen... Take 1 by mouth once daily  Orders: Venipuncture (04540) TLB-A1C / Hgb A1C (Glycohemoglobin)  (83036-A1C)  Problem # 2:  OSTEOPOROSIS (SPINE ONLY) (ICD-733.00) Assessment: Unchanged Offered scheduling repeat DEXA today.  She wants to wait.  Will check Vit D today. Her updated medication list for this problem includes:    Caltrate 600+d 600-400 Mg-unit Tabs (Calcium carbonate-vitamin d) .Marland Kitchen... Take 3 by mouth once daily  Orders: T-Vitamin D (25-Hydroxy) ([id number])  Problem # 3:  HYPERLIPIDEMIA (ICD-272.4) Assessment: Unchanged Stable.  REcheck in October, continue current dose of Lovastatin. Her updated medication list for this problem includes:    Lovastatin 40 Mg Tabs (Lovastatin) .Marland Kitchen... Take one by mouth once a day  Complete Medication List: 1)  Lovastatin 40 Mg Tabs (Lovastatin) .... Take one by mouth once a day 2)  Glucophage 1000 Mg Tabs (Metformin hcl) .... Take one by mouth two times a day 3)  Multivitamins Tabs (Multiple vitamin) .... Take one by mouth once a day 4)  Prilosec Otc 20 Mg Tbec (Omeprazole magnesium) .... Take one by mouth once a day as needed 5)  Fish Oil Concentrate 1000 Mg Caps (Omega-3 fatty acids) .... Take 3 by mouth once daily 6)  Caltrate 600+d 600-400 Mg-unit Tabs (Calcium carbonate-vitamin d) .... Take 3 by mouth once daily 7)  Metoprolol Succinate 100 Mg Xr24h-tab (Metoprolol succinate) .... Take 1 by mouth once daily 8)  Glipizide 5 Mg Xr24h-tab (Glipizide) .... Take 1 by mouth once daily 9)  Accu-chek Softclix Lancet Dev Misc (Lancet devices) .... Use as directed 10)  Accu-chek Compact Strp (Glucose blood) .... Use as directed 11)  Trazodone Hcl 50 Mg Tabs (Trazodone hcl) .Marland Kitchen.. 1-2 tabs at bedtime to help sleep  Current Allergies (reviewed today): ! * PREVACID ! CODEINE SULFA  Appended Document: 30 MIN F/U/CLE

## 2010-12-10 NOTE — Progress Notes (Signed)
  Phone Note Call from Patient Call back at [facility] Phone [phone]   Caller: Patient Call For: Dr.Aron Summary of Call: Pt. would like to switch to you from Dr.Letvak.  She saw you at her last visit and she was very impressed. Initial call taken by: Beau Fanny,  February 05, 2010 9:11 AM  Follow-up for Phone Call        Please ask Dr. Alphonsus Sias. Follow-up by: Ruthe Mannan MD,  February 05, 2010 10:17 AM  Additional Follow-up for Phone Call Additional follow up Details #1::        that is fine with me Glad she is happy with the care Additional Follow-up by: Cindee Salt MD,  February 05, 2010 1:47 PM    Additional Follow-up for Phone Call Additional follow up Details #2::    Pt. scheduled 30 min. appt. w/ Dr.Aron on 03/26/10 @ 9:15. Follow-up by: Beau Fanny,  February 06, 2010 8:15 AM

## 2010-12-10 NOTE — Progress Notes (Signed)
Summary: Rx Trazodone  Phone Note Refill Request Call back at [phone] Message from:  [facility] on June 24, 2010 8:43 AM  Refills Requested: Medication #1:  TRAZODONE HCL 50 MG TABS 1-2 tabs at bedtime to help sleep   Last Refilled: 05/24/2010 Received E-script request please advise.   Method Requested: Telephone to Pharmacy Initial call taken by: Linde Gillis CMA Duncan Dull),  June 24, 2010 8:44 AM    Prescriptions: TRAZODONE HCL 50 MG TABS (TRAZODONE HCL) 1-2 tabs at bedtime to help sleep  #60.0 Each x 0   Entered and Authorized by:   Ruthe Mannan MD   Signed by:   Ruthe Mannan MD on 06/24/2010   Method used:   Electronically to        [facility]. [address] [phone]* (retail)       [address] [place]  [address]       Ph: [phone]       Fax: [phone]   RxID:   [id number]

## 2010-12-10 NOTE — Assessment & Plan Note (Signed)
Summary: F/U 1 YEAR      Allergies Added:   Visit Type:  Follow-up Primary Molly [name]:  Molly Mannan MD  CC:  c/o PVC's that lasted about two weeks but not had any more since.Marland Kitchen  History of Present Illness: Molly [name] returns today after a long absence from our EP clinic.  She is a pleasant 70 yo woman with a h/o atrial arrythmias and associated palpitations.  She has been controlled nicely with fairly high dose metoprolol (low dose did not work) over the past couple of years.  About 2 weeks ago she began to experience increasingly frequent palpitations and took some additional fruit and her symptoms resolved and have not come back.  She has otherwise been stable.    Current Medications (verified): 1)  Lovastatin 40 Mg  Tabs (Lovastatin) .... Take One By Mouth Once A Day 2)  Glucophage 1000 Mg  Tabs (Metformin Hcl) .... Take One By Mouth Two Times A Day 3)  Multivitamins   Tabs (Multiple Vitamin) .... Take One By Mouth Once A Day 4)  Prilosec Otc 20 Mg  Tbec (Omeprazole Magnesium) .... Take One By Mouth Once A Day As Needed 5)  Fish Oil Concentrate 1000 Mg  Caps (Omega-3 Fatty Acids) .... Take 3 By Mouth Once Daily 6)  Caltrate 600+d 600-400 Mg-Unit  Tabs (Calcium Carbonate-Vitamin D) .... Take 3 By Mouth Once Daily 7)  Metoprolol Succinate 100 Mg Xr24h-Tab (Metoprolol Succinate) .... Take 1 By Mouth Once Daily 8)  Accu-Chek Softclix Lancet Dev  Misc (Lancet Devices) .... Use As Directed 9)  Accu-Chek Compact  Strp (Glucose Blood) .... Use As Directed 10)  Trazodone Hcl 50 Mg Tabs (Trazodone Hcl) .Marland Kitchen.. 1-2 Tabs At Bedtime To Help Sleep 11)  Glipizide Xl 10 Mg Tb24 (Glipizide) .Marland Kitchen.. 1 Tablet By Mouth Daily  Allergies (verified): 1)  ! * Prevacid 2)  ! Codeine 3)  Sulfa  Past History:  Past Medical History: Last updated: 09/08/2008 Colonic polyps, hx of--adenomatous Diabetes mellitus, type II Hyperlipidemia Osteoporosis IBS GERD  Past Surgical History: Last updated:  August 17, 2007 Hysterectomy 1980 Tubal ligation 1978 Vaginal deliveries x 2 Cystocele, cystourethropexy 12/87 DEXA- increase T - 3.1 spine, normal femur 01/04 Carotids- mild only 03/08  Family History: Last updated: 08/17/07 Father: Died at age 36, CHF, CAD Mother: Died at age 70, CHF, CAD (MI at 79) Siblings: One brother deceased ?? 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  Regular Exercise - no Drug Use - no  Risk Factors: Exercise: no (10/12/2009)  Risk Factors: Smoking Status: quit (10/12/2009)  Review of Systems  The patient denies chest pain, syncope, dyspnea on exertion, and peripheral edema.    Vital Signs:  Patient profile:   70 year old female Height:      67 inches Weight:      183 pounds BMI:     28.77 Pulse rate:   67 / minute BP sitting:   126 / 62  (left arm) Cuff size:   large  Vitals Entered By: Bishop Dublin, CMA (July 04, 2010 11:29 AM)  Physical Exam  General:  alert.  NAD Head:  normocephalic and atraumatic.   Mouth:  good dentition.   Neck:  supple, no masses, and no cervical lymphadenopathy.   Lungs:  normal respiratory effort, normal breath sounds, no crackles, and no wheezes.   Heart:  normal rate, regular rhythm, no murmur, and no gallop.   Abdomen:  soft and non-tender.   Msk:  refuses to move R great toe Pulses:  1+ in feet Extremities:  no edema Neurologic:  alert & oriented X3.     EKG  Procedure date:  07/04/2010  Findings:      Normal sinus rhythm with rate of:  67.  Impression & Recommendations:  Problem # 1:  PALPITATIONS (ICD-785.1) Her symptoms appear to be well controlled.  I have asked that she continue with her current dose of beta blocker but if her symptoms return, then she could consider  taking an additional dose or increasing her potassium intake. Her updated medication list for this problem includes:    Metoprolol Succinate 100 Mg Xr24h-tab (Metoprolol succinate) .Marland Kitchen... Take 1 by mouth once daily  Orders: EKG w/ Interpretation (93000)  Problem # 2:  HYPERLIPIDEMIA (ICD-272.4) She will continue her current meds and maintain a low sodium diet. Her updated medication list for this problem includes:    Lovastatin 40 Mg Tabs (Lovastatin) .Marland Kitchen... Take one by mouth once a day  Patient Instructions: 1)  Your physician recommends that you continue on your current medications as directed. Please refer to the Current Medication list given to you today. 2)  Your physician wants you to follow-up in:   1 YEAR You will receive a reminder letter in the mail two months in advance. If you don't receive a letter, please call our office to schedule the follow-up appointment.

## 2010-12-10 NOTE — Progress Notes (Signed)
Summary: Rx Trazodone  Phone Note Refill Request Call back at [phone] Message from:  [facility] on August 21, 2010 10:07 AM  Refills Requested: Medication #1:  TRAZODONE HCL 50 MG TABS 1-2 tabs at bedtime to help sleep   Last Refilled: 07/22/2010 Received E-script request please advise.   Method Requested: Telephone to Pharmacy Initial call taken by: Linde Gillis CMA Duncan Dull),  August 21, 2010 10:07 AM    Prescriptions: TRAZODONE HCL 50 MG TABS (TRAZODONE HCL) 1-2 tabs at bedtime to help sleep  #60.0 Each x 0   Entered and Authorized by:   Ruthe Mannan MD   Signed by:   Ruthe Mannan MD on 08/21/2010   Method used:   Electronically to        [facility]. [address] (retail)       [address]       [place], [place]  [address]       Ph: [phone]       Fax: [phone]   RxID:   [id number]

## 2010-12-10 NOTE — Progress Notes (Signed)
Summary: refill request for trazodone  Phone Note Refill Request   Refills Requested: Medication #1:  TRAZODONE HCL 50 MG TABS 1-2 tabs at bedtime to help sleep   Last Refilled: 08/21/2010 [facility]ed [facility] from [facility] [address], [phone]  Initial call taken by: Lowella Petties CMA, AAMA,  September 23, 2010 10:46 AM    Prescriptions: TRAZODONE HCL 50 MG TABS (TRAZODONE HCL) 1-2 tabs at bedtime to help sleep  #60.0 Each x 0   Entered and Authorized by:   Ruthe Mannan MD   Signed by:   Ruthe Mannan MD on 09/23/2010   Method used:   Electronically to        [facility] [phone]* (retail)       [address] [place], [place]  [address]       Ph: [phone] or [phone]       Fax: [phone]   RxID:   [id number]

## 2010-12-10 NOTE — Progress Notes (Signed)
Summary: Rx Trazodone  Phone Note Refill Request Call back at [phone] Message from:  [facility] on July 22, 2010 8:21 AM  Refills Requested: Medication #1:  TRAZODONE HCL 50 MG TABS 1-2 tabs at bedtime to help sleep   Last Refilled: 06/24/2010 Received E-script request please advise.   Method Requested: Telephone to Pharmacy Initial call taken by: Linde Gillis CMA Duncan Dull),  July 22, 2010 8:22 AM    Prescriptions: TRAZODONE HCL 50 MG TABS (TRAZODONE HCL) 1-2 tabs at bedtime to help sleep  #60.0 Each x 0   Entered and Authorized by:   Ruthe Mannan MD   Signed by:   Ruthe Mannan MD on 07/22/2010   Method used:   Electronically to        [facility]. [address] [phone]* (retail)       [address], [place]  [address]       Ph: [phone]       Fax: [phone]   RxID:   [id number]

## 2010-12-10 NOTE — Assessment & Plan Note (Signed)
Summary: CONGESTION/CLE   Vital Signs:  Patient profile:   70 year old female Height:      67 inches Weight:      190.38 pounds BMI:     29.93 Temp:     97.5 degrees F oral Pulse rate:   76 / minute Pulse rhythm:   regular BP sitting:   142 / 70  (left arm) Cuff size:   large  Vitals Entered By: Delilah Shan CMA Duncan Dull) (December 05, 2009 9:11 AM) CC: Congestion   History of Present Illness: 70 yo female with over one month of nasal congestion ,sinus pressure. Felt she was getting better until last few days, started feeling more pressure, fevers, and chills. Mildy productive cough. No wheezing or shortness of breath. No sore throat or ear pain.  Had Augmentin over a month ago that did not seem to help.  Current Medications (verified): 1)  Lovastatin 40 Mg  Tabs (Lovastatin) .... Take One By Mouth Once A Day 2)  Glucophage 1000 Mg  Tabs (Metformin Hcl) .... Take One By Mouth Two Times A Day 3)  Multivitamins   Tabs (Multiple Vitamin) .... Take One By Mouth Once A Day 4)  Prilosec Otc 20 Mg  Tbec (Omeprazole Magnesium) .... Take One By Mouth Once A Day As Needed 5)  Fish Oil Concentrate 1000 Mg  Caps (Omega-3 Fatty Acids) .... Take 3 By Mouth Once Daily 6)  Caltrate 600+d 600-400 Mg-Unit  Tabs (Calcium Carbonate-Vitamin D) .... Take 3 By Mouth Once Daily 7)  Metoprolol Succinate 100 Mg Xr24h-Tab (Metoprolol Succinate) .... Take 1 By Mouth Once Daily 8)  Glipizide 5 Mg Xr24h-Tab (Glipizide) .... Take 1 By Mouth Once Daily 9)  Accu-Chek Softclix Lancet Dev  Misc (Lancet Devices) .... Use As Directed 10)  Accu-Chek Compact  Strp (Glucose Blood) .... Use As Directed 11)  Trazodone Hcl 50 Mg Tabs (Trazodone Hcl) .Marland Kitchen.. 1-2 Tabs At Bedtime To Help Sleep 12)  Azithromycin 250 Mg  Tabs (Azithromycin) .... 2 By  Mouth Today and Then 1 Daily For 4 Days  Allergies: 1)  ! * Prevacid 2)  ! Codeine 3)  Sulfa  Review of Systems      See HPI General:  Complains of chills; denies  fever. ENT:  Complains of nasal congestion, postnasal drainage, and sinus pressure; denies earache and sore throat. CV:  Denies chest pain or discomfort. Resp:  Complains of cough and sputum productive; denies shortness of breath and wheezing. GI:  Denies diarrhea, nausea, and vomiting.  Physical Exam  General:  alert.  NAD Ears:  clear fluid TMs bilaterally. Nose:  nasal dischargemucosal pallor.   +sinuses Mouth:  no erythema and no exudates.   Lungs:  normal respiratory effort, normal breath sounds, no crackles, and no wheezes.   Heart:  normal rate, regular rhythm, no murmur, and no gallop.   Psych:  normally interactive, good eye contact, not anxious appearing, and not depressed appearing.     Impression & Recommendations:  Problem # 1:  SINUSITIS - ACUTE-NOS (ICD-461.9) Assessment New Failed amoxicillin.  Likely bacterial at this point, will try Zpack. Cont supportive care.  See pt instructions. The following medications were removed from the medication list:    Amoxicillin 500 Mg Tabs (Amoxicillin) .Marland Kitchen... 2 tabs by mouth two times a day for sinus infection Her updated medication list for this problem includes:    Azithromycin 250 Mg Tabs (Azithromycin) .Marland Kitchen... 2 by  mouth today and then 1 daily for 4 days  Complete Medication List: 1)  Lovastatin 40 Mg Tabs (Lovastatin) .... Take one by mouth once a day 2)  Glucophage 1000 Mg Tabs (Metformin hcl) .... Take one by mouth two times a day 3)  Multivitamins Tabs (Multiple vitamin) .... Take one by mouth once a day 4)  Prilosec Otc 20 Mg Tbec (Omeprazole magnesium) .... Take one by mouth once a day as needed 5)  Fish Oil Concentrate 1000 Mg Caps (Omega-3 fatty acids) .... Take 3 by mouth once daily 6)  Caltrate 600+d 600-400 Mg-unit Tabs (Calcium carbonate-vitamin d) .... Take 3 by mouth once daily 7)  Metoprolol Succinate 100 Mg Xr24h-tab (Metoprolol succinate) .... Take 1 by mouth once daily 8)  Glipizide 5 Mg Xr24h-tab  (Glipizide) .... Take 1 by mouth once daily 9)  Accu-chek Softclix Lancet Dev Misc (Lancet devices) .... Use as directed 10)  Accu-chek Compact Strp (Glucose blood) .... Use as directed 11)  Trazodone Hcl 50 Mg Tabs (Trazodone hcl) .Marland Kitchen.. 1-2 tabs at bedtime to help sleep 12)  Azithromycin 250 Mg Tabs (Azithromycin) .... 2 by  mouth today and then 1 daily for 4 days  Patient Instructions: 1)  Take antibiotic as directed.  Drink lots of fluids.  Treat sympotmatically with Mucinex, nasal saline irrigation, and Tylenol/Ibuprofen. You can also try using warm compresses.  Cough suppressant at night. Call if not improving as expected in 5-7 days.  Prescriptions: AZITHROMYCIN 250 MG  TABS (AZITHROMYCIN) 2 by  mouth today and then 1 daily for 4 days  #6 x 0   Entered and Authorized by:   Ruthe Mannan MD   Signed by:   Ruthe Mannan MD on 12/05/2009   Method used:   Electronically to        [facility]. [address] [phone]* (retail)       [address], [place]  [address]       Ph: [phone]       Fax: [phone]   RxID:   [id number]   Current Allergies (reviewed today): ! * PREVACID ! CODEINE SULFA

## 2010-12-12 NOTE — Progress Notes (Signed)
Summary: trazodone  Phone Note Refill Request Message from:  Fax from Pharmacy on October 29, 2010 10:30 AM  Refills Requested: Medication #1:  TRAZODONE HCL 50 MG TABS 1-2 tabs at bedtime to help sleep   Last Refilled: 10/01/2010 [facility] [phone]  Initial call taken by: Melody Comas,  October 29, 2010 10:31 AM    Prescriptions: TRAZODONE HCL 50 MG TABS (TRAZODONE HCL) 1-2 tabs at bedtime to help sleep  #60.0 Each x 0   Entered and Authorized by:   Ruthe Mannan MD   Signed by:   Ruthe Mannan MD on 10/30/2010   Method used:   Electronically to        [facility]. [address]* (retail)       [address]       [place], [place]  [address]       Ph: [phone]       Fax: [phone]   RxID:   [id number]

## 2010-12-12 NOTE — Letter (Signed)
Summary: [facility]  [facility]   Imported By: Lanelle Bal 11/25/2010 14:15:26  _____________________________________________________________________  External Attachment:    Type:   Image     Comment:   External Document

## 2010-12-12 NOTE — Progress Notes (Signed)
Summary: Rx Trazodone  Phone Note Refill Request Call back at [phone] Message from:  [facility] on October 29, 2010 8:35 AM  Refills Requested: Medication #1:  TRAZODONE HCL 50 MG TABS 1-2 tabs at bedtime to help sleep   Last Refilled: 10/01/2010 Received E-script request please advise.   Method Requested: Telephone to Pharmacy Initial call taken by: Linde Gillis CMA Duncan Dull),  October 29, 2010 8:35 AM    Prescriptions: TRAZODONE HCL 50 MG TABS (TRAZODONE HCL) 1-2 tabs at bedtime to help sleep  #60.0 Each x 0   Entered and Authorized by:   Ruthe Mannan MD   Signed by:   Ruthe Mannan MD on 10/29/2010   Method used:   Electronically to        [facility]. [address] (retail)       [address]       [place], [place]  [address]       Ph: [phone]       Fax: [phone]   RxID:   [id number]

## 2010-12-12 NOTE — Progress Notes (Signed)
Summary: Rx Trazodone  Phone Note Refill Request Call back at [phone] Message from:  [facility] on November 28, 2010 8:47 AM  Refills Requested: Medication #1:  TRAZODONE HCL 50 MG TABS 1-2 tabs at bedtime to help sleep   Last Refilled: 10/29/2010 Received E-script request please advise.   Method Requested: Electronic Initial call taken by: Linde Gillis CMA Duncan Dull),  November 28, 2010 8:47 AM    Prescriptions: TRAZODONE HCL 50 MG TABS (TRAZODONE HCL) 1-2 tabs at bedtime to help sleep  #60.0 Each x 0   Entered and Authorized by:   Ruthe Mannan MD   Signed by:   Ruthe Mannan MD on 11/28/2010   Method used:   Electronically to        [facility]. [address]* (retail)       [address]       [place], [place]  [address]       Ph: [phone]       Fax: [phone]   RxID:   [id number]

## 2010-12-12 NOTE — Progress Notes (Signed)
Summary: insurance wont cover lovastatin  Phone Note From Pharmacy   Caller: [facility] [address] [phone] Summary of Call: Insurance will not cover lovastatin.  They prefer simvastatin.  Do you want to change or I can call for a prior auth form.                 Lowella Petties CMA, AAMA  November 26, 2010 8:04 AM   Follow-up for Phone Call        please call pt to see if she can tolerate simvastatin (has she tried it before). Ruthe Mannan MD  November 26, 2010 8:13 AM  Pt says she has never tried simvastatin and is willing to. Follow-up by: Lowella Petties CMA, AAMA,  November 26, 2010 8:57 AM    New/Updated Medications: SIMVASTATIN 10 MG TABS (SIMVASTATIN) 1 by mouth at bedtime Prescriptions: SIMVASTATIN 10 MG TABS (SIMVASTATIN) 1 by mouth at bedtime  #90 x 3   Entered and Authorized by:   Ruthe Mannan MD   Signed by:   Ruthe Mannan MD on 11/26/2010   Method used:   Electronically to        [facility]. [address]* (retail)       [address]       [place], [place]  [address]       Ph: [phone]       Fax: [phone]   RxID:   [id number]

## 2010-12-12 NOTE — Progress Notes (Signed)
Summary: Metoprolol XR rx  Medications Added METOPROLOL TARTRATE 50 MG TABS (METOPROLOL TARTRATE) Take one tablet by mouth twice a day       Phone Note Call from Patient   Caller: Patient Call For: Molly [name] Summary of Call: Pt called her Metoprolol XR rx copay has gone up to $41 per month, pt wants to know if there is a cheaper generic alternative she can be rx.  Please advise.  Thanks Initial call taken by: Lanny Hurst RN,  October 16, 2010 4:50 PM  Follow-up for Phone Call        ok to go to Metoprolol Tartrate 50mg  two times a day per Dr Russ Halo, RN, BSN  October 29, 2010 11:06 AM    Additional Follow-up for Phone Call Additional follow up Details #1::        pt notified Metoprolol Tartrate 50mg  two times a day called in to pharmacy. pt ok with this. Additional Follow-up by: Lanny Hurst RN,  October 29, 2010 4:39 PM    New/Updated Medications: METOPROLOL TARTRATE 50 MG TABS (METOPROLOL TARTRATE) Take one tablet by mouth twice a day Prescriptions: METOPROLOL TARTRATE 50 MG TABS (METOPROLOL TARTRATE) Take one tablet by mouth twice a day  #30 x 6   Entered by:   Lanny Hurst RN   Authorized by:   Laren Boom, MD, [facility]   Signed by:   Lanny Hurst RN on 10/29/2010   Method used:   Faxed to ...       [facility] (retail)       [address]       [place], [place]    [place]       Ph: [phone]       Fax: [phone]   RxID:   [id number]

## 2010-12-24 ENCOUNTER — Encounter: Payer: Self-pay | Admitting: Family Medicine

## 2010-12-24 ENCOUNTER — Ambulatory Visit: Payer: Self-pay | Admitting: Unknown Physician Specialty

## 2010-12-25 LAB — PATHOLOGY REPORT

## 2011-01-16 NOTE — Procedures (Signed)
Summary: Upper GI Endoscopy/[facility]  Upper GI Endoscopy/[facility]   Imported By: Sherian Rein 01/06/2011 09:12:51  _____________________________________________________________________  External Attachment:    Type:   Image     Comment:   External Document  Appended Document: Upper GI Endoscopy/[facility] hiatal hernia

## 2011-01-16 NOTE — Procedures (Signed)
Summary: Colonoscopy/[facility]  Colonoscopy/[facility]   Imported By: Sherian Rein 01/06/2011 09:12:16  _____________________________________________________________________  External Attachment:    Type:   Image     Comment:   External Document  Appended Document: Colonoscopy/[facility] polyps, awaiting path

## 2011-02-26 ENCOUNTER — Other Ambulatory Visit: Payer: Self-pay | Admitting: Internal Medicine

## 2011-03-23 ENCOUNTER — Other Ambulatory Visit: Payer: Self-pay | Admitting: Family Medicine

## 2011-03-25 NOTE — Progress Notes (Signed)
[facility] OFF[facility] NOTE   Molly, [name]                          MRN:          [id number]  DATE:09/25/2008                            DOB:          January 25, 1941    Molly [name] returns today for followup.  She is a very pleasant woman with  a history of palpitations and documented atrial arrhythmias, who I  initially saw in consultation after referral from Dr. Alphonsus Sias back in  April of this year.  At that time, we put on a low-dose beta-blocker as  her palpitations seem to have worsened and she had saw Dr. Alphonsus Sias about  1 month ago and her dose of beta-blocker was increased from 25 to 100 mg  daily.  She notes that since then her palpitations are much improved (it  did not get much better with just 25 a day of beta-blockers).  The  patient had no chest pain.  She notes that her blood pressure if  anything is better and she is sleeping a little bit better than usual.   MEDICINES:  1. Lovastatin 40 a day.  2. Glucophage 1 g twice daily.  3. Multivitamin.  4. Lopressor 100 a day (it is uncertain whether this is the long-      acting version or not).   PHYSICAL EXAMINATION:  GENERAL:  She is a pleasant, well-appearing woman  of distress.  VITAL SIGNS:  The blood pressure is 136/78, the pulse 58 and regular,  the respirations 18, and the weight is 181 pounds.  NECK:  No jugular venous distention.  LUNGS:  Clear bilaterally auscultation.  No wheezes, rales, or rhonchi  are present.  There is no increased work of breathing.  CARDIOVASCULAR:  Regular rate.  Bradycardia.  Normal S1 and S2.  ABDOMEN:  Soft and nontender.  EXTREMITIES:  No cyanosis, clubbing, or edema.  The pulses are 2+ and  symmetric.   EKG demonstrates sinus bradycardia.   IMPRESSION:  1. Symptomatic palpitations.  2. Hypertension.  3. Sinus bradycardia (asymptomatic).   DISCUSSION:  Overall, Molly [name] is stable.  Her palpitations  are  improved on the higher dose of beta-blocker.  Her blood pressure is also  improved on the higher dose of beta-blocker.  Her  sleep is also improved on the higher dose of beta-blocker.  I will plan  to see the patient back in the office in 1 year for followup, sooner  should she have any additional symptoms.     Doylene Canning. Ladona Ridgel, MD  Electronically Signed    GWT/MedQ  DD: 09/25/2008  DT: 09/26/2008  Job #: [id number]   cc:   Karie Schwalbe, MD

## 2011-03-25 NOTE — Progress Notes (Signed)
[facility] OFFICE NOTE   Molly [name], Molly [name]                          MRN:          [id number]  DATE:07/10/2008                            DOB:          10-09-1941    Molly [name] returns today for followup.  She is a very pleasant middle-  aged woman with a history of symptomatic palpitations who had been  referred initially by Dr. Alphonsus Sias for evaluation of these palpitations.  When I saw her back in April, she was just mildly symptomatic and was  not particularly interested in additional medical therapy except that  she had noted some improvement with Valium.  Since I saw her, she states  that she has only taken Valium 3 or 4 times, but has had worsening  palpitations and sensation of irregular heartbeats and heart racing.  She returns today for followup.   Her medications include lovastatin 40 a day, Glucophage 1 g twice daily,  multiple vitamins, fish oil 1 g 3 times daily, and Caltrate 600 mg  t.i.d.   PHYSICAL EXAMINATION:  GENERAL:  She is a pleasant well-appearing woman  in no distress.  VITAL SIGNS:  Blood pressure was 139/80, the pulse 79 and regular, the  respirations were 18, and the weight was 190 pounds.  NECK:  Revealed no jugular venous distention.  LUNGS:  Clear bilaterally to auscultation.  No wheezes, rales, or  rhonchi are present.  CARDIOVASCULAR:  Revealed a regular rate and rhythm with normal S1 and  S2.  ABDOMEN:  Soft and nontender.  EXTREMITIES:  Demonstrated no edema.   IMPRESSION:  1. Symptomatic palpitations with worsening symptoms.  2. Dyslipidemia.   DISCUSSION:  I have recommended the patient start taking beta-blockers  for her palpitations.  I have given her a prescription today for 25 mg  daily as needed of Lopressor.  If she has worsening symptoms, she can  increase the dose and I have instructed her on this.  As far as her  dyslipidemia goes, the patient also had  questions today about the use of  her fish oil.  She states that she has a hard time taking it 3 times a  day.  I have asked that she double up on her dose at lunch and then take  1 tablet in the evening.  I will plan to see her back in 3-4 months to  see how the Lopressor has done with control of her symptoms.  She may  ultimately require daily beta-blockers for control of her irregular  heartbeats.     Doylene Canning. Ladona Ridgel, MD  Electronically Signed   GWT/MedQ  DD: 07/10/2008  DT: 07/11/2008  Job #: [id number]   cc:   Karie Schwalbe, MD

## 2011-03-25 NOTE — Progress Notes (Signed)
[facility] OFFICE NOTE   Molly [name], Molly [name]                          MRN:          [id number]  DATE:02/17/2008                            DOB:          05/25/1941    Molly [name] is referred today by Dr. Alphonsus Sias for evaluation of irregular  heartbeats and PVCs and nonsustained atrial tachycardia.  The patient is  a very pleasant 70 year old woman who gives a history of symptomatic  palpitations nearly 20 years ago.  At that time evaluation was basically  unremarkable.  She notes that over the last several months, perhaps  longer, she has rare episodes of palpitations, feeling like her heart is  beating irregularly.  There is have been no sustained episodes but these  are bothersome to her and make her quite anxious.  Years ago the patient  was given a prescription for Valium and she noted that her symptoms  markedly improved if she was able to take Valium on a p.r.n. basis.  She  had no other specific complaints today.   Her medications include:  1. Lovastatin 40 a day.  2. Glucophage 1 gram twice daily.  3. Multiple vitamins.  4. Fish oil 1 gram three times a day.   FAMILY HISTORY:  Noncontributory.   SOCIAL HISTORY:  The patient denies tobacco or ethanol abuse at the  present.  She is married.   ADDITIONAL PAST MEDICAL HISTORY:  Notable for:  1. Dyslipidemia.  2. Diabetes type 2 history of colonic polyps.  3. History of irritable bowel syndrome.  4. Osteoporosis.   ADDITIONAL SURGICAL HISTORY:  Notable for:  1. Two spontaneous vaginal deliveries.  2. Hysterectomy in 1980.  3. Tubal ligation in 1978.  4. Repair of a cystocele in 1987.   REVIEW OF SYSTEMS:  Otherwise negative except as noted in the HPI.  She  has not had any syncope.   EXAMINATION:  She is a pleasant, well-appearing, middle-aged woman in no  acute distress.  The blood pressure was 156/60, the pulse was 75 and  regular, the  respirations were 18, the weight was 182 pounds.  HEENT:  Normocephalic, atraumatic.  She has some alopecia.  NECK:  Revealed no jugular venous distention, no thyromegaly.  Trachea  was midline.  The carotids were 2+ and symmetric.  LUNGS:  Clear bilaterally to auscultation.  No wheezes, rales or rhonchi  are present.  There is no increased work of breathing.  CARDIOVASCULAR EXAM:  Reveals a regular rate and rhythm with normal S1  and S2.  There are no skipped beats when I examined her.  PMI was not  enlarged or laterally displaced.  ABDOMINAL EXAM:  Obese, nontender, nondistended.  There was no  organomegaly.  The bowel sounds are present.  There is no rebound or  guarding.  EXTREMITIES:  Demonstrated no cyanosis, clubbing or edema.  The pulses  are 2+ and symmetric.  NEUROLOGIC:  Alert and oriented x3 with cranial nerves intact.  Strength  is 5/5 and symmetric.   The EKG demonstrates sinus rhythm with normal axis and intervals.  Prior  cardiac monitoring demonstrates  sinus rhythm with infrequent PVCs and  one episode of nonsustained atrial tachycardia.   IMPRESSION:  1. Symptomatic palpitations.  2. History of documented premature ventricular contractions and      nonsustained atrial tachycardia.  3. Hypertension.  4. Dyslipidemia.  5. Diabetes.   DISCUSSION:  Molly [name] is stable.  I have reassured her that her  palpitations are not secondary to anything dangerous.  She is still  quite anxious and has requested a prescription for Valium which I will  oblige her, though I have indicated this is not to be used for sleeping  problems and is only be used if her palpitations are really severe.  If  they become increasingly severe, then consideration of a beta blocker  would be warranted.  Will plan to see her back in several months.     Doylene Canning. Ladona Ridgel, MD  Electronically Signed    GWT/MedQ  DD: 02/17/2008  DT: 02/18/2008  Job #: [id number]   cc:   Karie Schwalbe, MD

## 2011-04-24 ENCOUNTER — Encounter: Payer: Self-pay | Admitting: Internal Medicine

## 2011-05-23 ENCOUNTER — Other Ambulatory Visit: Payer: Self-pay | Admitting: Internal Medicine

## 2011-05-23 ENCOUNTER — Other Ambulatory Visit: Payer: Self-pay | Admitting: Cardiovascular Disease

## 2011-08-20 ENCOUNTER — Other Ambulatory Visit: Payer: Self-pay | Admitting: Internal Medicine

## 2011-08-21 ENCOUNTER — Other Ambulatory Visit: Payer: Self-pay | Admitting: Family Medicine

## 2011-08-21 NOTE — Telephone Encounter (Signed)
Ok to refill but needs a physical.

## 2011-08-21 NOTE — Telephone Encounter (Signed)
Last OV was 09/2010 please advise.

## 2011-08-21 NOTE — Telephone Encounter (Signed)
Patient advised as instructed via telephone.  She will call back to schedule a CPX.

## 2011-11-18 ENCOUNTER — Other Ambulatory Visit: Payer: Self-pay | Admitting: Family Medicine

## 2011-12-16 ENCOUNTER — Other Ambulatory Visit: Payer: Self-pay | Admitting: *Deleted

## 2011-12-16 MED ORDER — SIMVASTATIN 10 MG PO TABS: 10.0000 mg | ORAL_TABLET | Freq: Every day | ORAL | Status: DC

## 2011-12-16 NOTE — Telephone Encounter (Signed)
Patient request refill but hasnt been seen in over 1 year

## 2012-01-12 ENCOUNTER — Other Ambulatory Visit: Payer: Self-pay | Admitting: *Deleted

## 2012-02-14 ENCOUNTER — Other Ambulatory Visit: Payer: Self-pay | Admitting: Internal Medicine

## 2012-04-14 ENCOUNTER — Ambulatory Visit: Payer: Self-pay | Admitting: Internal Medicine

## 2012-05-13 ENCOUNTER — Other Ambulatory Visit: Payer: Self-pay | Admitting: Internal Medicine

## 2012-06-20 ENCOUNTER — Other Ambulatory Visit: Payer: Self-pay | Admitting: Internal Medicine

## 2012-09-04 ENCOUNTER — Other Ambulatory Visit: Payer: Self-pay | Admitting: Family Medicine

## 2012-12-14 ENCOUNTER — Other Ambulatory Visit: Payer: Self-pay | Admitting: Internal Medicine

## 2012-12-15 ENCOUNTER — Other Ambulatory Visit: Payer: Self-pay | Admitting: Internal Medicine

## 2013-05-24 ENCOUNTER — Ambulatory Visit: Payer: Self-pay | Admitting: Internal Medicine

## 2014-05-18 ENCOUNTER — Ambulatory Visit: Payer: Self-pay | Admitting: Unknown Physician Specialty

## 2014-05-19 LAB — PATHOLOGY REPORT

## 2014-06-06 DIAGNOSIS — M8580 Other specified disorders of bone density and structure, unspecified site: Secondary | ICD-10-CM | POA: Insufficient documentation

## 2014-06-06 DIAGNOSIS — I10 Essential (primary) hypertension: Secondary | ICD-10-CM | POA: Insufficient documentation

## 2014-06-06 DIAGNOSIS — E785 Hyperlipidemia, unspecified: Secondary | ICD-10-CM | POA: Diagnosis present

## 2014-06-06 DIAGNOSIS — E119 Type 2 diabetes mellitus without complications: Secondary | ICD-10-CM

## 2014-07-04 ENCOUNTER — Ambulatory Visit: Payer: Self-pay | Admitting: Internal Medicine

## 2015-05-23 ENCOUNTER — Other Ambulatory Visit: Payer: Self-pay | Admitting: Internal Medicine

## 2015-05-23 DIAGNOSIS — Z1231 Encounter for screening mammogram for malignant neoplasm of breast: Secondary | ICD-10-CM

## 2015-05-28 ENCOUNTER — Other Ambulatory Visit: Payer: Self-pay | Admitting: Internal Medicine

## 2015-05-28 ENCOUNTER — Ambulatory Visit
Admission: RE | Admit: 2015-05-28 | Discharge: 2015-05-28 | Disposition: A | Discharge status: Home / Self Care | Payer: Medicare Other | Source: Ambulatory Visit | Attending: Internal Medicine | Admitting: Internal Medicine

## 2015-05-28 DIAGNOSIS — Z1231 Encounter for screening mammogram for malignant neoplasm of breast: Secondary | ICD-10-CM | POA: Insufficient documentation

## 2015-06-29 DIAGNOSIS — R7989 Other specified abnormal findings of blood chemistry: Secondary | ICD-10-CM | POA: Insufficient documentation

## 2016-05-01 ENCOUNTER — Other Ambulatory Visit: Payer: Self-pay | Admitting: Internal Medicine

## 2016-05-01 DIAGNOSIS — Z1231 Encounter for screening mammogram for malignant neoplasm of breast: Secondary | ICD-10-CM

## 2016-08-22 ENCOUNTER — Ambulatory Visit
Admission: RE | Admit: 2016-08-22 | Discharge: 2016-08-22 | Disposition: A | Discharge status: Home / Self Care | Payer: Medicare Other | Source: Ambulatory Visit | Attending: Internal Medicine | Admitting: Internal Medicine

## 2016-08-22 ENCOUNTER — Other Ambulatory Visit: Payer: Self-pay | Admitting: Internal Medicine

## 2016-08-22 DIAGNOSIS — Z1231 Encounter for screening mammogram for malignant neoplasm of breast: Secondary | ICD-10-CM

## 2016-12-24 DIAGNOSIS — E119 Type 2 diabetes mellitus without complications: Secondary | ICD-10-CM | POA: Insufficient documentation

## 2017-06-22 ENCOUNTER — Encounter: Payer: Self-pay | Admitting: *Deleted

## 2017-06-26 NOTE — Discharge Instructions (Signed)

## 2017-06-29 ENCOUNTER — Encounter
Admission: RE | Disposition: A | Discharge status: Home / Self Care | Payer: Self-pay | Source: Ambulatory Visit | Attending: Ophthalmology

## 2017-06-29 ENCOUNTER — Ambulatory Visit: Payer: Medicare Other | Admitting: Anesthesiology

## 2017-06-29 ENCOUNTER — Ambulatory Visit
Admission: RE | Admit: 2017-06-29 | Discharge: 2017-06-29 | Disposition: A | Discharge status: Home / Self Care | Payer: Medicare Other | Source: Ambulatory Visit | Attending: Ophthalmology | Admitting: Ophthalmology

## 2017-06-29 DIAGNOSIS — I10 Essential (primary) hypertension: Secondary | ICD-10-CM | POA: Diagnosis not present

## 2017-06-29 DIAGNOSIS — Z87891 Personal history of nicotine dependence: Secondary | ICD-10-CM | POA: Insufficient documentation

## 2017-06-29 DIAGNOSIS — E1136 Type 2 diabetes mellitus with diabetic cataract: Secondary | ICD-10-CM | POA: Insufficient documentation

## 2017-06-29 DIAGNOSIS — M109 Gout, unspecified: Secondary | ICD-10-CM | POA: Diagnosis not present

## 2017-06-29 DIAGNOSIS — K219 Gastro-esophageal reflux disease without esophagitis: Secondary | ICD-10-CM | POA: Insufficient documentation

## 2017-06-29 DIAGNOSIS — H52221 Regular astigmatism, right eye: Secondary | ICD-10-CM | POA: Insufficient documentation

## 2017-06-29 DIAGNOSIS — M1990 Unspecified osteoarthritis, unspecified site: Secondary | ICD-10-CM | POA: Diagnosis not present

## 2017-06-29 HISTORY — DX: Essential (primary) hypertension: I10

## 2017-06-29 HISTORY — DX: Cardiac murmur, unspecified: R01.1

## 2017-06-29 HISTORY — PX: CATARACT EXTRACTION W/PHACO: SHX586

## 2017-06-29 HISTORY — DX: Unspecified osteoarthritis, unspecified site: M19.90

## 2017-06-29 LAB — GLUCOSE, CAPILLARY
Glucose-Capillary: 151 mg/dL — ABNORMAL HIGH (ref 65–99)
Glucose-Capillary: 135 mg/dL — ABNORMAL HIGH (ref 65–99)

## 2017-06-29 SURGERY — PHACOEMULSIFICATION, CATARACT, WITH IOL INSERTION
Anesthesia: Monitor Anesthesia Care | Site: Eye | Laterality: Right | Wound class: Clean

## 2017-06-29 MED ORDER — ONDANSETRON HCL 4 MG/2ML IJ SOLN: 4.0000 mg | Freq: Once | INTRAMUSCULAR | Status: DC | PRN

## 2017-06-29 MED ORDER — SODIUM HYALURONATE 10 MG/ML IO SOLN
INTRAOCULAR | Status: DC | PRN
  Administered 2017-06-29: 0.55 mL via INTRAOCULAR

## 2017-06-29 MED ORDER — SODIUM HYALURONATE 23 MG/ML IO SOLN
INTRAOCULAR | Status: DC | PRN
  Administered 2017-06-29: 0.6 mL via INTRAOCULAR

## 2017-06-29 MED ORDER — MOXIFLOXACIN HCL 0.5 % OP SOLN
OPHTHALMIC | Status: DC | PRN
  Administered 2017-06-29: 0.2 mL via OPHTHALMIC

## 2017-06-29 MED ORDER — EPINEPHRINE PF 1 MG/ML IJ SOLN
INTRAOCULAR | Status: DC | PRN
  Administered 2017-06-29: 77 mL via OPHTHALMIC

## 2017-06-29 MED ORDER — ARMC OPHTHALMIC DILATING DROPS
1.0000 "application " | OPHTHALMIC | Status: DC | PRN
  Administered 2017-06-29 (×3): 1 via OPHTHALMIC

## 2017-06-29 MED ORDER — MIDAZOLAM HCL 2 MG/2ML IJ SOLN
INTRAMUSCULAR | Status: DC | PRN
  Administered 2017-06-29 (×2): 1 mg via INTRAVENOUS

## 2017-06-29 MED ORDER — FENTANYL CITRATE (PF) 100 MCG/2ML IJ SOLN
INTRAMUSCULAR | Status: DC | PRN
  Administered 2017-06-29: 25 ug via INTRAVENOUS
  Administered 2017-06-29: 50 ug via INTRAVENOUS
  Administered 2017-06-29: 25 ug via INTRAVENOUS

## 2017-06-29 MED ORDER — LIDOCAINE HCL (PF) 2 % IJ SOLN
INTRAMUSCULAR | Status: DC | PRN
  Administered 2017-06-29: .5 mL

## 2017-06-29 MED ORDER — LACTATED RINGERS IV SOLN: INTRAVENOUS | Status: DC

## 2017-06-29 SURGICAL SUPPLY — 17 items
ACRYSofIQ astigmatism IOL 19.5 D (Intraocular Lens) ×1 IMPLANT
CANNULA ANT/CHMB 27G (MISCELLANEOUS) ×1 IMPLANT
CANNULA ANT/CHMB 27GA (MISCELLANEOUS) ×2 IMPLANT
DISSECTOR HYDRO NUCLEUS 50X22 (MISCELLANEOUS) ×2 IMPLANT
GLOVE BIO SURGEON STRL SZ8 (GLOVE) ×2 IMPLANT
GLOVE SURG LX 7.5 STRW (GLOVE) ×1
GLOVE SURG LX STRL 7.5 STRW (GLOVE) ×1 IMPLANT
GOWN STRL REUS W/ TWL LRG LVL3 (GOWN DISPOSABLE) ×2 IMPLANT
GOWN STRL REUS W/TWL LRG LVL3 (GOWN DISPOSABLE) ×4
MARKER SKIN DUAL TIP RULER LAB (MISCELLANEOUS) ×2 IMPLANT
PACK CATARACT (MISCELLANEOUS) ×2 IMPLANT
PACK DR. KING ARMS (PACKS) ×2 IMPLANT
PACK EYE AFTER SURG (MISCELLANEOUS) ×2 IMPLANT
SYR 3ML LL SCALE MARK (SYRINGE) ×2 IMPLANT
SYR TB 1ML LUER SLIP (SYRINGE) ×2 IMPLANT
WATER STERILE IRR 500ML POUR (IV SOLUTION) ×2 IMPLANT
WIPE NON LINTING 3.25X3.25 (MISCELLANEOUS) ×2 IMPLANT

## 2017-06-29 NOTE — Transfer of Care (Signed)
Immediate Anesthesia Transfer of Care Note  Patient: Molly [name]  Procedure(s) Performed: Procedure(s) with comments: CATARACT EXTRACTION PHACO AND INTRAOCULAR LENS PLACEMENT (IOC)  Toric Diabetic Right (Right) - diabetic - oral meds  Patient Location: PACU  Anesthesia Type: MAC  Level of Consciousness: awake, alert  and patient cooperative  Airway and Oxygen Therapy: Patient Spontanous Breathing and Patient connected to supplemental oxygen  Post-op Assessment: Post-op Vital signs reviewed, Patient's Cardiovascular Status Stable, Respiratory Function Stable, Patent Airway and No signs of Nausea or vomiting  Post-op Vital Signs: Reviewed and stable  Complications: No apparent anesthesia complications

## 2017-06-29 NOTE — Anesthesia Procedure Notes (Signed)
Procedure Name: MAC Date/Time: 06/29/2017 8:59 AM Performed by: Cameron Ali Pre-anesthesia Checklist: Patient identified, Emergency Drugs available, Suction available, Timeout performed and Patient being monitored Patient Re-evaluated:Patient Re-evaluated prior to induction Oxygen Delivery Method: Nasal cannula Placement Confirmation: positive ETCO2

## 2017-06-29 NOTE — Anesthesia Postprocedure Evaluation (Signed)
Anesthesia Post Note  Patient: Molly [name]  Procedure(s) Performed: Procedure(s) (LRB): CATARACT EXTRACTION PHACO AND INTRAOCULAR LENS PLACEMENT (IOC)  Toric Diabetic Right (Right)  Patient location during evaluation: PACU Anesthesia Type: MAC Level of consciousness: awake and alert, oriented and patient cooperative Pain management: pain level controlled Vital Signs Assessment: post-procedure vital signs reviewed and stable Respiratory status: spontaneous breathing, nonlabored ventilation and respiratory function stable Cardiovascular status: blood pressure returned to baseline and stable Postop Assessment: adequate PO intake Anesthetic complications: no    Darrin Nipper

## 2017-06-29 NOTE — Anesthesia Preprocedure Evaluation (Signed)
Anesthesia Evaluation  Patient identified by MRN, date of birth, ID band Patient awake    Reviewed: Allergy & Precautions, NPO status , Patient's Chart, lab work & pertinent test results  History of Anesthesia Complications Negative for: history of anesthetic complications  Airway Mallampati: I  TM Distance: >3 FB Neck ROM: Full    Dental no notable dental hx.    Pulmonary former smoker (quit 1992),    Pulmonary exam normal breath sounds clear to auscultation       Cardiovascular Exercise Tolerance: Good hypertension, Normal cardiovascular exam Rhythm:Regular Rate:Normal     Neuro/Psych negative neurological ROS     GI/Hepatic GERD  Controlled,IBS   Endo/Other  diabetes, Type 2  Renal/GU negative Renal ROS     Musculoskeletal  (+) Arthritis , Osteoarthritis,  Gout    Abdominal   Peds  Hematology negative hematology ROS (+)   Anesthesia Other Findings   Reproductive/Obstetrics                             Anesthesia Physical Anesthesia Plan  ASA: II  Anesthesia Plan: MAC   Post-op Pain Management:    Induction: Intravenous  PONV Risk Score and Plan: 0  Airway Management Planned:   Additional Equipment:   Intra-op Plan:   Post-operative Plan:   Informed Consent: I have reviewed the patients History and Physical, chart, labs and discussed the procedure including the risks, benefits and alternatives for the proposed anesthesia with the patient or authorized representative who has indicated his/her understanding and acceptance.     Plan Discussed with: CRNA  Anesthesia Plan Comments:         Anesthesia Quick Evaluation

## 2017-06-29 NOTE — H&P (Signed)
The History and Physical notes are on paper, have been signed, and are to be scanned.   I have examined the patient and there are no changes to the H&P.   Molly [name] 06/29/2017 8:48 AM

## 2017-06-29 NOTE — Op Note (Signed)
OPERATIVE NOTE  Molly [name] [id number] 06/29/2017   PREOPERATIVE DIAGNOSIS:   1.  Nuclear sclerotic cataract right eye.  H25.11 2.  Regular corneal astigmatism, right eye.   POSTOPERATIVE DIAGNOSIS:    Nuclear sclerotic cataract right eye.     PROCEDURE:  Phacoemusification with posterior chamber intraocular lens placement of the right eye   LENS:   Implant Name Type Inv. Item Serial No. Manufacturer Lot No. LRB No. Used  ACRYSofIQ astigmatism IOL 19.5 D Intraocular Lens   [id number] ALCON   Right 1       SN6AT6 +19.5 (+3.75 D cyl) oriented to 086   ULTRASOUND TIME: 0 minutes 37 seconds.  CDE 5.64   SURGEON:  [name], MD, MPH  ANESTHESIOLOGIST: Anesthesiologist: Darrin Nipper, MD CRNA: Cameron Ali, CRNA   ANESTHESIA:  Topical with tetracaine drops augmented with 1% preservative-free intracameral lidocaine.  ESTIMATED BLOOD LOSS: less than 1 mL.   COMPLICATIONS:  None.   DESCRIPTION OF PROCEDURE:  The patient was identified in the holding room and transported to the operating room and placed in the supine position under the operating microscope.  The right eye was identified as the operative eye and it was prepped and draped in the usual sterile ophthalmic fashion.  The verion system was registered.   A 1.0 millimeter clear-corneal paracentesis was made at the 10:30 position. 0.5 ml of preservative-free 1% lidocaine with epinephrine was injected into the anterior chamber.  The anterior chamber was filled with Healon 5 viscoelastic.  A 2.4 millimeter keratome was used to make a near-clear corneal incision at the 8:00 position.  A curvilinear capsulorrhexis was made with a cystotome and capsulorrhexis forceps.  Balanced salt solution was used to hydrodissect and hydrodelineate the nucleus.   Phacoemulsification was then used in stop and chop fashion to remove the lens nucleus and epinucleus.  The remaining cortex was then removed using the irrigation and aspiration  handpiece. Healon was then placed into the capsular bag to distend it for lens placement.  A lens was then injected into the capsular bag and rotated into position.  The remaining viscoelastic was aspirated.  The rotation of the toric lens was fine tuned to 086 degrees and was well centered and stable.   Wounds were hydrated with balanced salt solution.  The anterior chamber was inflated to a physiologic pressure with balanced salt solution.    Intracameral vigamox 0.1 mL undiluted was injected into the eye and a drop placed onto the ocular surface.  No wound leaks were noted.  The patient was taken to the recovery room in stable condition without complications of anesthesia or surgery  [name] 06/29/2017, 9:25 AM

## 2017-06-30 ENCOUNTER — Encounter: Payer: Self-pay | Admitting: Ophthalmology

## 2017-07-21 ENCOUNTER — Encounter: Payer: Self-pay | Admitting: *Deleted

## 2017-07-22 NOTE — Discharge Instructions (Signed)

## 2017-07-26 NOTE — Anesthesia Preprocedure Evaluation (Addendum)
Anesthesia Evaluation  Patient identified by MRN, date of birth, ID band Patient awake    Reviewed: Allergy & Precautions, NPO status , Patient's Chart, lab work & pertinent test results  History of Anesthesia Complications Negative for: history of anesthetic complications  Airway Mallampati: I  TM Distance: >3 FB Neck ROM: Full    Dental no notable dental hx.    Pulmonary neg pulmonary ROS, former smoker,    Pulmonary exam normal breath sounds clear to auscultation       Cardiovascular Exercise Tolerance: Good hypertension, Normal cardiovascular exam Rhythm:Regular Rate:Normal     Neuro/Psych negative neurological ROS  negative psych ROS   GI/Hepatic Neg liver ROS, GERD  Medicated,IBS   Endo/Other  diabetes, Type 2  Renal/GU negative Renal ROS  negative genitourinary   Musculoskeletal  (+) Arthritis , Osteoarthritis,  Gout    Abdominal   Peds  Hematology negative hematology ROS (+)   Anesthesia Other Findings   Reproductive/Obstetrics                            Anesthesia Physical  Anesthesia Plan  ASA: II  Anesthesia Plan: MAC   Post-op Pain Management:    Induction: Intravenous  PONV Risk Score and Plan: 0 and Ondansetron, Dexamethasone and Treatment may vary due to age  Airway Management Planned:   Additional Equipment:   Intra-op Plan:   Post-operative Plan:   Informed Consent: I have reviewed the patients History and Physical, chart, labs and discussed the procedure including the risks, benefits and alternatives for the proposed anesthesia with the patient or authorized representative who has indicated his/her understanding and acceptance.     Plan Discussed with: CRNA  Anesthesia Plan Comments:        Anesthesia Quick Evaluation

## 2017-07-27 ENCOUNTER — Ambulatory Visit
Admission: RE | Admit: 2017-07-27 | Discharge: 2017-07-27 | Disposition: A | Discharge status: Home / Self Care | Payer: Medicare Other | Source: Ambulatory Visit | Attending: Ophthalmology | Admitting: Ophthalmology

## 2017-07-27 ENCOUNTER — Ambulatory Visit: Payer: Medicare Other | Admitting: Anesthesiology

## 2017-07-27 ENCOUNTER — Encounter
Admission: RE | Disposition: A | Discharge status: Home / Self Care | Payer: Self-pay | Source: Ambulatory Visit | Attending: Ophthalmology

## 2017-07-27 DIAGNOSIS — K219 Gastro-esophageal reflux disease without esophagitis: Secondary | ICD-10-CM | POA: Diagnosis not present

## 2017-07-27 DIAGNOSIS — R002 Palpitations: Secondary | ICD-10-CM | POA: Diagnosis not present

## 2017-07-27 DIAGNOSIS — I499 Cardiac arrhythmia, unspecified: Secondary | ICD-10-CM | POA: Insufficient documentation

## 2017-07-27 DIAGNOSIS — K449 Diaphragmatic hernia without obstruction or gangrene: Secondary | ICD-10-CM | POA: Diagnosis not present

## 2017-07-27 DIAGNOSIS — I10 Essential (primary) hypertension: Secondary | ICD-10-CM | POA: Insufficient documentation

## 2017-07-27 DIAGNOSIS — M1990 Unspecified osteoarthritis, unspecified site: Secondary | ICD-10-CM | POA: Insufficient documentation

## 2017-07-27 DIAGNOSIS — I340 Nonrheumatic mitral (valve) insufficiency: Secondary | ICD-10-CM | POA: Insufficient documentation

## 2017-07-27 DIAGNOSIS — Z882 Allergy status to sulfonamides status: Secondary | ICD-10-CM | POA: Diagnosis not present

## 2017-07-27 DIAGNOSIS — Z90710 Acquired absence of both cervix and uterus: Secondary | ICD-10-CM | POA: Diagnosis not present

## 2017-07-27 DIAGNOSIS — K589 Irritable bowel syndrome without diarrhea: Secondary | ICD-10-CM | POA: Diagnosis not present

## 2017-07-27 DIAGNOSIS — Z91040 Latex allergy status: Secondary | ICD-10-CM | POA: Diagnosis not present

## 2017-07-27 DIAGNOSIS — Z885 Allergy status to narcotic agent status: Secondary | ICD-10-CM | POA: Diagnosis not present

## 2017-07-27 DIAGNOSIS — M109 Gout, unspecified: Secondary | ICD-10-CM | POA: Diagnosis not present

## 2017-07-27 DIAGNOSIS — H2512 Age-related nuclear cataract, left eye: Secondary | ICD-10-CM | POA: Diagnosis present

## 2017-07-27 DIAGNOSIS — E1136 Type 2 diabetes mellitus with diabetic cataract: Secondary | ICD-10-CM | POA: Diagnosis not present

## 2017-07-27 DIAGNOSIS — Z9849 Cataract extraction status, unspecified eye: Secondary | ICD-10-CM | POA: Insufficient documentation

## 2017-07-27 DIAGNOSIS — Z87891 Personal history of nicotine dependence: Secondary | ICD-10-CM | POA: Diagnosis not present

## 2017-07-27 HISTORY — PX: CATARACT EXTRACTION W/PHACO: SHX586

## 2017-07-27 LAB — GLUCOSE, CAPILLARY
Glucose-Capillary: 125 mg/dL — ABNORMAL HIGH (ref 65–99)
Glucose-Capillary: 130 mg/dL — ABNORMAL HIGH (ref 65–99)

## 2017-07-27 SURGERY — PHACOEMULSIFICATION, CATARACT, WITH IOL INSERTION
Anesthesia: Monitor Anesthesia Care | Laterality: Right | Wound class: Clean

## 2017-07-27 MED ORDER — MOXIFLOXACIN HCL 0.5 % OP SOLN
OPHTHALMIC | Status: DC | PRN
  Administered 2017-07-27: 0.2 mL via OPHTHALMIC

## 2017-07-27 MED ORDER — EPINEPHRINE PF 1 MG/ML IJ SOLN
INTRAOCULAR | Status: DC | PRN
  Administered 2017-07-27: 71 mL via OPHTHALMIC

## 2017-07-27 MED ORDER — LIDOCAINE HCL (PF) 4 % IJ SOLN
INTRAOCULAR | Status: DC | PRN
  Administered 2017-07-27: 1 mL via OPHTHALMIC

## 2017-07-27 MED ORDER — ONDANSETRON HCL 4 MG/2ML IJ SOLN: 4.0000 mg | Freq: Once | INTRAMUSCULAR | Status: DC | PRN

## 2017-07-27 MED ORDER — FENTANYL CITRATE (PF) 100 MCG/2ML IJ SOLN
INTRAMUSCULAR | Status: DC | PRN
Start: 2017-07-27 — End: 2017-07-27
  Administered 2017-07-27: 50 ug via INTRAVENOUS

## 2017-07-27 MED ORDER — MIDAZOLAM HCL 2 MG/2ML IJ SOLN
INTRAMUSCULAR | Status: DC | PRN
  Administered 2017-07-27: 2 mg via INTRAVENOUS

## 2017-07-27 MED ORDER — SODIUM HYALURONATE 10 MG/ML IO SOLN
INTRAOCULAR | Status: DC | PRN
  Administered 2017-07-27: 0.55 mL via INTRAOCULAR

## 2017-07-27 MED ORDER — ARMC OPHTHALMIC DILATING DROPS
1.0000 "application " | OPHTHALMIC | Status: DC | PRN
  Administered 2017-07-27 (×4): 1 via OPHTHALMIC

## 2017-07-27 MED ORDER — OXYCODONE HCL 5 MG PO TABS: 5.0000 mg | ORAL_TABLET | Freq: Once | ORAL | Status: DC | PRN

## 2017-07-27 MED ORDER — OXYCODONE HCL 5 MG/5ML PO SOLN: 5.0000 mg | Freq: Once | ORAL | Status: DC | PRN

## 2017-07-27 MED ORDER — LACTATED RINGERS IV SOLN: INTRAVENOUS | Status: DC

## 2017-07-27 MED ORDER — ACETAMINOPHEN 325 MG PO TABS
325.0000 mg | ORAL_TABLET | ORAL | Status: DC | PRN
  Administered 2017-07-27: 650 mg via ORAL

## 2017-07-27 MED ORDER — LACTATED RINGERS IV SOLN: 10.0000 mL/h | INTRAVENOUS | Status: DC

## 2017-07-27 MED ORDER — ACETAMINOPHEN 160 MG/5ML PO SOLN: 325.0000 mg | ORAL | Status: DC | PRN

## 2017-07-27 MED ORDER — SODIUM HYALURONATE 23 MG/ML IO SOLN
INTRAOCULAR | Status: DC | PRN
  Administered 2017-07-27: 0.6 mL via INTRAOCULAR

## 2017-07-27 SURGICAL SUPPLY — 17 items
AcrySof IQ Toric IOL (Intraocular Lens) ×1 IMPLANT
CANNULA ANT/CHMB 27G (MISCELLANEOUS) ×1 IMPLANT
CANNULA ANT/CHMB 27GA (MISCELLANEOUS) ×2 IMPLANT
DISSECTOR HYDRO NUCLEUS 50X22 (MISCELLANEOUS) ×2 IMPLANT
GLOVE BIO SURGEON STRL SZ8 (GLOVE) ×2 IMPLANT
GLOVE SURG LX 7.5 STRW (GLOVE) ×1
GLOVE SURG LX STRL 7.5 STRW (GLOVE) ×1 IMPLANT
GOWN STRL REUS W/ TWL LRG LVL3 (GOWN DISPOSABLE) ×2 IMPLANT
GOWN STRL REUS W/TWL LRG LVL3 (GOWN DISPOSABLE) ×4
MARKER SKIN DUAL TIP RULER LAB (MISCELLANEOUS) ×2 IMPLANT
PACK CATARACT (MISCELLANEOUS) ×2 IMPLANT
PACK DR. KING ARMS (PACKS) ×2 IMPLANT
PACK EYE AFTER SURG (MISCELLANEOUS) ×2 IMPLANT
SYR 3ML LL SCALE MARK (SYRINGE) ×2 IMPLANT
SYR TB 1ML LUER SLIP (SYRINGE) ×2 IMPLANT
WATER STERILE IRR 500ML POUR (IV SOLUTION) ×2 IMPLANT
WIPE NON LINTING 3.25X3.25 (MISCELLANEOUS) ×2 IMPLANT

## 2017-07-27 NOTE — H&P (Signed)
The History and Physical notes are on paper, have been signed, and are to be scanned.   I have examined the patient and there are no changes to the H&P.   Molly [name] 07/27/2017 7:25 AM

## 2017-07-27 NOTE — Transfer of Care (Signed)
Immediate Anesthesia Transfer of Care Note  Patient: Molly [name]  Procedure(s) Performed: Procedure(s) with comments: CATARACT EXTRACTION PHACO AND INTRAOCULAR LENS PLACEMENT (IOC) LEFT DIABETIC TORIC (Right) - Diabetic - oral meds  Patient Location: PACU  Anesthesia Type: MAC  Level of Consciousness: awake, alert  and patient cooperative  Airway and Oxygen Therapy: Patient Spontanous Breathing and Patient connected to supplemental oxygen  Post-op Assessment: Post-op Vital signs reviewed, Patient's Cardiovascular Status Stable, Respiratory Function Stable, Patent Airway and No signs of Nausea or vomiting  Post-op Vital Signs: Reviewed and stable  Complications: No apparent anesthesia complications

## 2017-07-27 NOTE — Interval H&P Note (Signed)
History and Physical Interval Note:  There was a delay in starting the case, since the toric lens intended was not on hand and had to be procured.  07/27/2017 9:20 AM  Molly [name]  has presented today for surgery, with the diagnosis of H25.12 CATARACT  The various methods of treatment have been discussed with the patient and family. After consideration of risks, benefits and other options for treatment, the patient has consented to  Procedure(s) with comments: CATARACT EXTRACTION PHACO AND INTRAOCULAR LENS PLACEMENT (IOC) LEFT DIABETIC TORIC (Right) - Diabetic - oral meds as a surgical intervention .  The patient's history has been reviewed, patient examined, no change in status, stable for surgery.  I have reviewed the patient's chart and labs.  Questions were answered to the patient's satisfaction.     [name]

## 2017-07-27 NOTE — Anesthesia Postprocedure Evaluation (Signed)
Anesthesia Post Note  Patient: Molly [name]  Procedure(s) Performed: Procedure(s) (LRB): CATARACT EXTRACTION PHACO AND INTRAOCULAR LENS PLACEMENT (IOC) LEFT DIABETIC TORIC (Right)  Patient location during evaluation: PACU Anesthesia Type: MAC Level of consciousness: awake and alert Pain management: pain level controlled Vital Signs Assessment: post-procedure vital signs reviewed and stable Respiratory status: spontaneous breathing, nonlabored ventilation, respiratory function stable and patient connected to nasal cannula oxygen Cardiovascular status: stable and blood pressure returned to baseline Postop Assessment: no apparent nausea or vomiting Anesthetic complications: no    Zaydon Kinser

## 2017-07-27 NOTE — Op Note (Signed)
OPERATIVE NOTE  BRYAN OMURA [id number] 07/27/2017   PREOPERATIVE DIAGNOSIS:  Nuclear sclerotic cataract left eye.  H25.12   POSTOPERATIVE DIAGNOSIS:    Nuclear sclerotic cataract left eye.     PROCEDURE:  Phacoemusification with posterior chamber intraocular lens placement of the left eye   LENS:  * No implants in log *    SN6AT6 TORIC lens was not on hand, brought from [place].  SN6AT6 +19.0 D lens rotated to 093 degrees with aid of VERION system.   ULTRASOUND TIME: 0 minutes 39.3 seconds.  CDE 7.52   SURGEON:  [name], MD, MPH   ANESTHESIA:  Topical with tetracaine drops augmented with 1% preservative-free intracameral lidocaine.  ESTIMATED BLOOD LOSS: <1 mL   COMPLICATIONS:  None.   DESCRIPTION OF PROCEDURE:  The patient was identified in the holding room and transported to the operating room and placed in the supine position under the operating microscope.  The left eye was identified as the operative eye and it was prepped and draped in the usual sterile ophthalmic fashion.  The verion system was registered.   A 1.0 millimeter clear-corneal paracentesis was made at the 5:00 position. 0.5 ml of preservative-free 1% lidocaine with epinephrine was injected into the anterior chamber.  The anterior chamber was filled with Healon 5 viscoelastic.  A 2.4 millimeter keratome was used to make a near-clear corneal incision at the 2:00 position.  A curvilinear capsulorrhexis was made with a cystotome and capsulorrhexis forceps.  Balanced salt solution was used to hydrodissect and hydrodelineate the nucleus.   Phacoemulsification was then used in stop and chop fashion to remove the lens nucleus and epinucleus.  The remaining cortex was then removed using the irrigation and aspiration handpiece. Healon was then placed into the capsular bag to distend it for lens placement.  A lens was then injected into the capsular bag.  The verion system was used to rotate the lens to the  approximate position. The remaining viscoelastic was aspirated and then the lens was rotated to precisely 093 without difficulty.  Good centration and alignment was achieved.   Wounds were hydrated with balanced salt solution.  The anterior chamber was inflated to a physiologic pressure with balanced salt solution.  Intracameral vigamox 0.1 mL undiltued was injected into the eye and a drop placed onto the ocular surface. No wound leaks were noted.  The patient was taken to the recovery room in stable condition without complications of anesthesia or surgery  [name] 07/27/2017, 10:03 AM

## 2017-07-27 NOTE — Anesthesia Procedure Notes (Addendum)
Procedure Name: MAC Performed by: Lanora Reveron Pre-anesthesia Checklist: Patient identified, Emergency Drugs available, Suction available, Timeout performed and Patient being monitored Patient Re-evaluated:Patient Re-evaluated prior to inductionOxygen Delivery Method: Nasal cannula Placement Confirmation: positive ETCO2     

## 2017-07-28 ENCOUNTER — Encounter: Payer: Self-pay | Admitting: Ophthalmology

## 2017-08-07 ENCOUNTER — Other Ambulatory Visit: Payer: Self-pay | Admitting: Internal Medicine

## 2017-08-07 DIAGNOSIS — Z1231 Encounter for screening mammogram for malignant neoplasm of breast: Secondary | ICD-10-CM

## 2017-08-28 ENCOUNTER — Ambulatory Visit
Admission: RE | Admit: 2017-08-28 | Discharge: 2017-08-28 | Disposition: A | Discharge status: Home / Self Care | Payer: Medicare Other | Source: Ambulatory Visit | Attending: Internal Medicine | Admitting: Internal Medicine

## 2017-08-28 DIAGNOSIS — Z1231 Encounter for screening mammogram for malignant neoplasm of breast: Secondary | ICD-10-CM

## 2017-08-28 HISTORY — DX: Malignant (primary) neoplasm, unspecified: C80.1

## 2017-11-27 DIAGNOSIS — R69 Illness, unspecified: Secondary | ICD-10-CM | POA: Diagnosis not present

## 2017-12-31 DIAGNOSIS — L304 Erythema intertrigo: Secondary | ICD-10-CM | POA: Diagnosis not present

## 2017-12-31 DIAGNOSIS — L821 Other seborrheic keratosis: Secondary | ICD-10-CM | POA: Diagnosis not present

## 2017-12-31 DIAGNOSIS — L538 Other specified erythematous conditions: Secondary | ICD-10-CM | POA: Diagnosis not present

## 2017-12-31 DIAGNOSIS — D1801 Hemangioma of skin and subcutaneous tissue: Secondary | ICD-10-CM | POA: Diagnosis not present

## 2017-12-31 DIAGNOSIS — B078 Other viral warts: Secondary | ICD-10-CM | POA: Diagnosis not present

## 2017-12-31 DIAGNOSIS — Z85828 Personal history of other malignant neoplasm of skin: Secondary | ICD-10-CM | POA: Diagnosis not present

## 2018-01-05 DIAGNOSIS — E1129 Type 2 diabetes mellitus with other diabetic kidney complication: Secondary | ICD-10-CM | POA: Diagnosis not present

## 2018-01-05 DIAGNOSIS — M810 Age-related osteoporosis without current pathological fracture: Secondary | ICD-10-CM | POA: Diagnosis not present

## 2018-01-05 DIAGNOSIS — R809 Proteinuria, unspecified: Secondary | ICD-10-CM | POA: Diagnosis not present

## 2018-01-12 DIAGNOSIS — R809 Proteinuria, unspecified: Secondary | ICD-10-CM | POA: Diagnosis not present

## 2018-01-12 DIAGNOSIS — I10 Essential (primary) hypertension: Secondary | ICD-10-CM | POA: Diagnosis not present

## 2018-01-12 DIAGNOSIS — R7989 Other specified abnormal findings of blood chemistry: Secondary | ICD-10-CM | POA: Diagnosis not present

## 2018-01-12 DIAGNOSIS — E113293 Type 2 diabetes mellitus with mild nonproliferative diabetic retinopathy without macular edema, bilateral: Secondary | ICD-10-CM | POA: Diagnosis not present

## 2018-01-12 DIAGNOSIS — Z79899 Other long term (current) drug therapy: Secondary | ICD-10-CM | POA: Diagnosis not present

## 2018-01-12 DIAGNOSIS — E1121 Type 2 diabetes mellitus with diabetic nephropathy: Secondary | ICD-10-CM | POA: Diagnosis not present

## 2018-01-12 DIAGNOSIS — M810 Age-related osteoporosis without current pathological fracture: Secondary | ICD-10-CM | POA: Diagnosis not present

## 2018-01-12 DIAGNOSIS — E1129 Type 2 diabetes mellitus with other diabetic kidney complication: Secondary | ICD-10-CM | POA: Diagnosis not present

## 2018-01-12 DIAGNOSIS — Z0000 Encounter for general adult medical examination without abnormal findings: Secondary | ICD-10-CM | POA: Diagnosis not present

## 2018-01-12 DIAGNOSIS — E7800 Pure hypercholesterolemia, unspecified: Secondary | ICD-10-CM | POA: Diagnosis not present

## 2018-02-08 DIAGNOSIS — R69 Illness, unspecified: Secondary | ICD-10-CM | POA: Diagnosis not present

## 2018-02-10 DIAGNOSIS — M8588 Other specified disorders of bone density and structure, other site: Secondary | ICD-10-CM | POA: Diagnosis not present

## 2018-02-25 DIAGNOSIS — R69 Illness, unspecified: Secondary | ICD-10-CM | POA: Diagnosis not present

## 2018-03-18 ENCOUNTER — Encounter: Payer: Self-pay | Admitting: Emergency Medicine

## 2018-03-18 ENCOUNTER — Inpatient Hospital Stay
Admission: EM | Admit: 2018-03-18 | Discharge: 2018-03-22 | DRG: 440 | Disposition: A | Discharge status: Home Health | Payer: Medicare HMO | Attending: Internal Medicine | Admitting: Internal Medicine

## 2018-03-18 ENCOUNTER — Other Ambulatory Visit: Payer: Self-pay

## 2018-03-18 ENCOUNTER — Emergency Department: Payer: Medicare HMO

## 2018-03-18 DIAGNOSIS — T383X5A Adverse effect of insulin and oral hypoglycemic [antidiabetic] drugs, initial encounter: Secondary | ICD-10-CM | POA: Diagnosis present

## 2018-03-18 DIAGNOSIS — Z7982 Long term (current) use of aspirin: Secondary | ICD-10-CM

## 2018-03-18 DIAGNOSIS — Z91048 Other nonmedicinal substance allergy status: Secondary | ICD-10-CM | POA: Diagnosis not present

## 2018-03-18 DIAGNOSIS — K589 Irritable bowel syndrome without diarrhea: Secondary | ICD-10-CM | POA: Diagnosis present

## 2018-03-18 DIAGNOSIS — E119 Type 2 diabetes mellitus without complications: Secondary | ICD-10-CM | POA: Diagnosis present

## 2018-03-18 DIAGNOSIS — K859 Acute pancreatitis without necrosis or infection, unspecified: Principal | ICD-10-CM | POA: Diagnosis present

## 2018-03-18 DIAGNOSIS — Z885 Allergy status to narcotic agent status: Secondary | ICD-10-CM | POA: Diagnosis not present

## 2018-03-18 DIAGNOSIS — Z85828 Personal history of other malignant neoplasm of skin: Secondary | ICD-10-CM

## 2018-03-18 DIAGNOSIS — K2980 Duodenitis without bleeding: Secondary | ICD-10-CM | POA: Diagnosis not present

## 2018-03-18 DIAGNOSIS — D72829 Elevated white blood cell count, unspecified: Secondary | ICD-10-CM | POA: Diagnosis not present

## 2018-03-18 DIAGNOSIS — M1990 Unspecified osteoarthritis, unspecified site: Secondary | ICD-10-CM | POA: Diagnosis present

## 2018-03-18 DIAGNOSIS — M810 Age-related osteoporosis without current pathological fracture: Secondary | ICD-10-CM | POA: Diagnosis present

## 2018-03-18 DIAGNOSIS — R109 Unspecified abdominal pain: Secondary | ICD-10-CM | POA: Diagnosis not present

## 2018-03-18 DIAGNOSIS — R112 Nausea with vomiting, unspecified: Secondary | ICD-10-CM | POA: Diagnosis not present

## 2018-03-18 DIAGNOSIS — R011 Cardiac murmur, unspecified: Secondary | ICD-10-CM | POA: Diagnosis present

## 2018-03-18 DIAGNOSIS — Z87891 Personal history of nicotine dependence: Secondary | ICD-10-CM | POA: Diagnosis not present

## 2018-03-18 DIAGNOSIS — Z7984 Long term (current) use of oral hypoglycemic drugs: Secondary | ICD-10-CM | POA: Diagnosis not present

## 2018-03-18 DIAGNOSIS — K219 Gastro-esophageal reflux disease without esophagitis: Secondary | ICD-10-CM | POA: Diagnosis present

## 2018-03-18 DIAGNOSIS — R69 Illness, unspecified: Secondary | ICD-10-CM | POA: Diagnosis not present

## 2018-03-18 DIAGNOSIS — Z79899 Other long term (current) drug therapy: Secondary | ICD-10-CM | POA: Diagnosis not present

## 2018-03-18 DIAGNOSIS — Z888 Allergy status to other drugs, medicaments and biological substances status: Secondary | ICD-10-CM | POA: Diagnosis not present

## 2018-03-18 DIAGNOSIS — Z882 Allergy status to sulfonamides status: Secondary | ICD-10-CM

## 2018-03-18 DIAGNOSIS — D126 Benign neoplasm of colon, unspecified: Secondary | ICD-10-CM | POA: Diagnosis present

## 2018-03-18 DIAGNOSIS — I10 Essential (primary) hypertension: Secondary | ICD-10-CM | POA: Diagnosis present

## 2018-03-18 DIAGNOSIS — K853 Drug induced acute pancreatitis without necrosis or infection: Secondary | ICD-10-CM | POA: Diagnosis not present

## 2018-03-18 DIAGNOSIS — K760 Fatty (change of) liver, not elsewhere classified: Secondary | ICD-10-CM | POA: Diagnosis present

## 2018-03-18 DIAGNOSIS — E785 Hyperlipidemia, unspecified: Secondary | ICD-10-CM | POA: Diagnosis present

## 2018-03-18 DIAGNOSIS — R1110 Vomiting, unspecified: Secondary | ICD-10-CM | POA: Diagnosis not present

## 2018-03-18 LAB — URINALYSIS, COMPLETE (UACMP) WITH MICROSCOPIC
Bilirubin Urine: NEGATIVE
HGB URINE DIPSTICK: NEGATIVE
KETONES UR: 80 mg/dL — AB
LEUKOCYTES UA: NEGATIVE
NITRITE: NEGATIVE
PH: 5 (ref 5.0–8.0)
Protein, ur: NEGATIVE mg/dL
SQUAMOUS EPITHELIAL / LPF: NONE SEEN (ref 0–5)
Specific Gravity, Urine: 1.025 (ref 1.005–1.030)

## 2018-03-18 LAB — CBC
HEMATOCRIT: 44.1 % (ref 35.0–47.0)
HEMOGLOBIN: 14.3 g/dL (ref 12.0–16.0)
MCH: 26.2 pg (ref 26.0–34.0)
MCHC: 32.5 g/dL (ref 32.0–36.0)
MCV: 80.6 fL (ref 80.0–100.0)
Platelets: 303 10*3/uL (ref 150–440)
RBC: 5.47 MIL/uL — AB (ref 3.80–5.20)
RDW: 18.1 % — AB (ref 11.5–14.5)
WBC: 18.2 10*3/uL — AB (ref 3.6–11.0)

## 2018-03-18 LAB — COMPREHENSIVE METABOLIC PANEL
ALBUMIN: 4.6 g/dL (ref 3.5–5.0)
ALK PHOS: 36 U/L — AB (ref 38–126)
ALT: 15 U/L (ref 14–54)
ANION GAP: 13 (ref 5–15)
AST: 22 U/L (ref 15–41)
BUN: 16 mg/dL (ref 6–20)
CHLORIDE: 104 mmol/L (ref 101–111)
CO2: 24 mmol/L (ref 22–32)
Calcium: 9.4 mg/dL (ref 8.9–10.3)
Creatinine, Ser: 0.54 mg/dL (ref 0.44–1.00)
Glucose, Bld: 281 mg/dL — ABNORMAL HIGH (ref 65–99)
POTASSIUM: 4.2 mmol/L (ref 3.5–5.1)
SODIUM: 141 mmol/L (ref 135–145)
TOTAL PROTEIN: 7.7 g/dL (ref 6.5–8.1)
Total Bilirubin: 0.8 mg/dL (ref 0.3–1.2)

## 2018-03-18 LAB — LIPASE, BLOOD: Lipase: 1182 U/L — ABNORMAL HIGH (ref 11–51)

## 2018-03-18 LAB — GLUCOSE, CAPILLARY
GLUCOSE-CAPILLARY: 160 mg/dL — AB (ref 65–99)
Glucose-Capillary: 301 mg/dL — ABNORMAL HIGH (ref 65–99)

## 2018-03-18 MED ORDER — MORPHINE SULFATE (PF) 4 MG/ML IV SOLN
4.0000 mg | Freq: Once | INTRAVENOUS | Status: AC
  Administered 2018-03-18: 4 mg via INTRAVENOUS
  Filled 2018-03-18: qty 1

## 2018-03-18 MED ORDER — HYDROMORPHONE HCL 1 MG/ML IJ SOLN
1.0000 mg | INTRAMUSCULAR | Status: DC | PRN
  Administered 2018-03-18 – 2018-03-22 (×19): 1 mg via INTRAVENOUS
  Filled 2018-03-18 (×18): qty 1

## 2018-03-18 MED ORDER — FENTANYL CITRATE (PF) 100 MCG/2ML IJ SOLN
25.0000 ug | Freq: Once | INTRAMUSCULAR | Status: AC
  Administered 2018-03-18: 25 ug via INTRAVENOUS
  Filled 2018-03-18: qty 2

## 2018-03-18 MED ORDER — HYDROMORPHONE HCL 1 MG/ML IJ SOLN
1.0000 mg | INTRAMUSCULAR | Status: DC | PRN
  Administered 2018-03-18: 1 mg via INTRAVENOUS
  Filled 2018-03-18 (×2): qty 1

## 2018-03-18 MED ORDER — SODIUM CHLORIDE 0.9 % IV SOLN
Freq: Once | INTRAVENOUS | Status: AC
  Administered 2018-03-18: 14:00:00 via INTRAVENOUS

## 2018-03-18 MED ORDER — FAMOTIDINE IN NACL 20-0.9 MG/50ML-% IV SOLN
20.0000 mg | Freq: Two times a day (BID) | INTRAVENOUS | Status: DC
  Administered 2018-03-18 – 2018-03-22 (×8): 20 mg via INTRAVENOUS
  Filled 2018-03-18 (×8): qty 50

## 2018-03-18 MED ORDER — ENOXAPARIN SODIUM 40 MG/0.4ML ~~LOC~~ SOLN
40.0000 mg | SUBCUTANEOUS | Status: DC
  Administered 2018-03-18 – 2018-03-21 (×4): 40 mg via SUBCUTANEOUS
  Filled 2018-03-18 (×4): qty 0.4

## 2018-03-18 MED ORDER — ONDANSETRON HCL 4 MG PO TABS: 4.0000 mg | ORAL_TABLET | Freq: Four times a day (QID) | ORAL | Status: DC | PRN

## 2018-03-18 MED ORDER — ONDANSETRON HCL 4 MG/2ML IJ SOLN
4.0000 mg | Freq: Once | INTRAMUSCULAR | Status: AC
  Administered 2018-03-18: 4 mg via INTRAVENOUS
  Filled 2018-03-18: qty 2

## 2018-03-18 MED ORDER — ONDANSETRON HCL 4 MG/2ML IJ SOLN
4.0000 mg | Freq: Four times a day (QID) | INTRAMUSCULAR | Status: DC | PRN
  Administered 2018-03-18 – 2018-03-21 (×4): 4 mg via INTRAVENOUS
  Filled 2018-03-18 (×4): qty 2

## 2018-03-18 MED ORDER — ACETAMINOPHEN 325 MG PO TABS
650.0000 mg | ORAL_TABLET | Freq: Four times a day (QID) | ORAL | Status: DC | PRN
  Administered 2018-03-19: 650 mg via ORAL
  Filled 2018-03-18: qty 2

## 2018-03-18 MED ORDER — ONDANSETRON 4 MG PO TBDP
4.0000 mg | ORAL_TABLET | Freq: Once | ORAL | Status: AC | PRN
  Administered 2018-03-18: 4 mg via ORAL
  Filled 2018-03-18: qty 1

## 2018-03-18 MED ORDER — IOPAMIDOL (ISOVUE-370) INJECTION 76%
75.0000 mL | Freq: Once | INTRAVENOUS | Status: AC | PRN
  Administered 2018-03-18: 75 mL via INTRAVENOUS
  Filled 2018-03-18: qty 75

## 2018-03-18 MED ORDER — FENTANYL CITRATE (PF) 100 MCG/2ML IJ SOLN
50.0000 ug | Freq: Once | INTRAMUSCULAR | Status: AC
  Administered 2018-03-18: 50 ug via INTRAVENOUS
  Filled 2018-03-18: qty 2

## 2018-03-18 MED ORDER — ACETAMINOPHEN 650 MG RE SUPP: 650.0000 mg | Freq: Four times a day (QID) | RECTAL | Status: DC | PRN

## 2018-03-18 MED ORDER — LACTATED RINGERS IV SOLN
INTRAVENOUS | Status: DC
  Administered 2018-03-18 – 2018-03-20 (×5): via INTRAVENOUS

## 2018-03-18 MED ORDER — DEXTROSE 5 % AND 0.9 % NACL IV BOLUS
1000.0000 mL | Freq: Once | INTRAVENOUS | Status: AC
  Administered 2018-03-18: 1000 mL via INTRAVENOUS
  Filled 2018-03-18: qty 1000

## 2018-03-18 MED ORDER — SODIUM CHLORIDE 0.9 % IV BOLUS
1000.0000 mL | Freq: Once | INTRAVENOUS | Status: AC
Start: 2018-03-18 — End: 2018-03-18
  Administered 2018-03-18: 1000 mL via INTRAVENOUS

## 2018-03-18 MED ORDER — INSULIN ASPART 100 UNIT/ML ~~LOC~~ SOLN
0.0000 [IU] | Freq: Three times a day (TID) | SUBCUTANEOUS | Status: DC
  Administered 2018-03-18: 7 [IU] via SUBCUTANEOUS
  Administered 2018-03-21 – 2018-03-22 (×2): 2 [IU] via SUBCUTANEOUS
  Filled 2018-03-18 (×3): qty 1

## 2018-03-18 NOTE — ED Notes (Signed)
ED Provider at bedside. 

## 2018-03-18 NOTE — ED Provider Notes (Signed)
[facility] Emergency Department Provider Note  ____________________________________________  Time seen: Approximately 3:48 PM  I have reviewed the triage vital signs and the nursing notes.   HISTORY  Chief Complaint Abdominal Pain    HPI Molly [name] is a 77 y.o. female who is in her usual state of health until this morning around 8:00 AM when she developed rapid onset diffuse upper abdominal pain which is sharp, severe, constant, no aggravating or alleviating factors. Nonradiating. Associated with nausea and vomiting. Never had anything like this before. No history of gallstones, heavy drinking, trauma. No steroids use. No recent viral illness.  Patient does relate that she has had intermittent nausea after eating for the past few months but no other significant symptoms, has not needed workup previously.      Past Medical History:  Diagnosis Date  . Arthritis   . Cancer ([place])    skin  . Diabetes mellitus, type 2 ([place])   . FH: colonic polyps    hx of-adenomatous  . GERD (gastroesophageal reflux disease)   . Heart murmur    dx'd in 1960s. followed by PCP  . Hyperlipidemia   . Hypertension   . IBS (irritable bowel syndrome)   . Osteoporosis      Patient Active Problem List   Diagnosis Date Noted  . BACK PAIN, THORACIC REGION, LEFT 10/01/2010  . SINUSITIS - ACUTE-NOS 08/30/2009  . SLEEP DISORDER, CHRONIC 08/30/2009  . GOUT 04/03/2009  . GERD 09/08/2008  . PALPITATIONS 12/14/2007  . DIABETES MELLITUS, TYPE II 07/20/2007  . HYPERLIPIDEMIA 07/20/2007  . IBS 07/20/2007     Past Surgical History:  Procedure Laterality Date  . ABDOMINAL HYSTERECTOMY    . Carotids  3/08   mild only  . CATARACT EXTRACTION W/PHACO Right 06/29/2017   Procedure: CATARACT EXTRACTION PHACO AND INTRAOCULAR LENS PLACEMENT ([place])  Toric Diabetic Right;  Surgeon: Eulogio Bear, MD;  Location: [place];  Service: Ophthalmology;  Laterality: Right;   diabetic - oral meds  . CATARACT EXTRACTION W/PHACO Right 07/27/2017   Procedure: CATARACT EXTRACTION PHACO AND INTRAOCULAR LENS PLACEMENT ([place]) LEFT DIABETIC TORIC;  Surgeon: Eulogio Bear, MD;  Location: [place];  Service: Ophthalmology;  Laterality: Right;  Diabetic - oral meds  . cystocele, cystourethropexy  12/87  . dexa     increase T - 3.1 spine, normal femur 1/04  . hysterectomy (other)  1980  . TUBAL LIGATION  1978  . vaginal deliveries     x2     Prior to Admission medications   Medication Sig Start Date End Date Taking? Authorizing Provider  aspirin 81 MG tablet Take 81 mg by mouth daily.    [provider]  Biotin 2500 MCG CAPS Take by mouth 2 (two) times daily.    [provider]  CALCIUM PO Take 750 mg by mouth 2 (two) times daily.    [provider]  canagliflozin (INVOKANA) 300 MG TABS tablet Take 300 mg by mouth daily.    [provider]  Cholecalciferol (VITAMIN D PO) Take 500 Units by mouth 2 (two) times daily.    [provider]  glipiZIDE (GLUCOTROL) 10 MG 24 hr tablet TAKE 1 TABLET BY MOUTH EVERY DAY Patient taking differently: TAKE 1 TABLET BY MOUTH EVERY DAY (Pt takes BID) 03/23/11   Lucille Passy, MD  glucose blood (ACCU-CHEK COMPACT STRIPS) test strip 1 each by Other route as directed. Use as instructed     [provider]  Lancet Devices [facility]) lancets 1 each by Other route as needed. Use as instructed     [provider]  lisinopril (PRINIVIL,ZESTRIL) 10 MG tablet Take 10 mg by mouth daily.    [provider]  metFORMIN (GLUCOPHAGE) 1000 MG tablet TAKE 1 TABLET BY MOUTH TWICE DAILY 11/18/11   Lucille Passy, MD  metoprolol (LOPRESSOR) 50 MG tablet TAKE 1 TABLET BY MOUTH TWICE DAILY 12/14/12   Evans Lance, MD  Multiple Vitamins-Minerals (MULTIVITAL) tablet Take 1 tablet by mouth daily.      [provider]  Omega-3 Fatty Acids (FISH OIL CONCENTRATE) 1000  MG CAPS Take by mouth daily. Take 3     [provider]  omeprazole (PRILOSEC) 20 MG capsule TAKE ONE CAPSULE BY MOUTH EVERY DAY 02/26/11   Lucille Passy, MD  simvastatin (ZOCOR) 10 MG tablet Take 1 tablet (10 mg total) by mouth at bedtime. 12/16/11   Lucille Passy, MD  traZODone (DESYREL) 50 MG tablet Take 50 mg by mouth at bedtime. Take 1-2     [provider]  VITAMIN K PO Take 40 mcg by mouth 2 (two) times daily.    [provider]     Allergies Alendronate; Codeine; Lansoprazole; Sulfonamide derivatives; and Adhesive [tape]   Family History  Problem Relation Age of Onset  . Heart failure Mother   . Coronary artery disease Mother   . Heart attack Mother 41  . Diabetes Mother   . Heart failure Father   . Coronary artery disease Father   . Breast cancer Neg Hx     Social History Social History   Tobacco Use  . Smoking status: Former Smoker    Last attempt to quit: 1992    Years since quitting: 27.3  . Smokeless tobacco: Never Used  Substance Use Topics  . Alcohol use: Yes    Comment: Occ -1x/mo  . Drug use: No    Review of Systems  Constitutional:   No fever or chills.  ENT:   No sore throat. No rhinorrhea. Cardiovascular:   No chest pain or syncope. Respiratory:   No dyspnea or cough. Gastrointestinal:  severe upper abdominal pain as above with vomiting. No constipation. Musculoskeletal:   Negative for focal pain or swelling All other systems reviewed and are negative except as documented above in ROS and HPI.  ____________________________________________   PHYSICAL EXAM:  VITAL SIGNS: ED Triage Vitals  Enc Vitals Group     BP 03/18/18 1208 (!) 144/53     Pulse Rate 03/18/18 1208 65     Resp 03/18/18 1208 20     Temp 03/18/18 1208 97.6 F (36.4 C)     Temp Source 03/18/18 1208 Oral     SpO2 03/18/18 1208 98 %     Weight 03/18/18 1209 160 lb (72.6 kg)     Height 03/18/18 1209 5\' 6"  (1.676 m)     Head Circumference --      Peak  Flow --      Pain Score 03/18/18 1209 10     Pain Loc --      Pain Edu? --      Excl. in [place]? --     Vital signs reviewed, nursing assessments reviewed.   Constitutional:   Alert and oriented. ill-appearing, very uncomfortable not in distress. Eyes:   Conjunctivae are normal. EOMI. PERRL. ENT      Head:   Normocephalic and atraumatic.  Nose:   No congestion/rhinnorhea.       Mouth/Throat:   MMM, no pharyngeal erythema. No peritonsillar mass.       Neck:   No meningismus. Full ROM. Hematological/Lymphatic/Immunilogical:   No cervical lymphadenopathy. Cardiovascular:   RRR. Symmetric bilateral radial and DP pulses.  No murmurs.  Respiratory:   Normal respiratory effort without tachypnea/retractions. Breath sounds are clear and equal bilaterally. No wheezes/rales/rhonchi. Gastrointestinal:   Soft with diffuse upper abdominal tenderness. Negative Murphy sign. There is fullness in the epigastrium without pulsatile mass or bruit.. Non distended. There is no CVA tenderness.  No rebound, rigidity, or guarding.  Musculoskeletal:   Normal range of motion in all extremities. No joint effusions.  No lower extremity tenderness.  No edema. Neurologic:   Normal speech and language.  Motor grossly intact. No acute focal neurologic deficits are appreciated.  Skin:    Skin is warm, dry and intact. No rash noted.  No petechiae, purpura, or bullae.  ____________________________________________    LABS (pertinent positives/negatives) (all labs ordered are listed, but only abnormal results are displayed) Labs Reviewed  LIPASE, BLOOD - Abnormal; Notable for the following components:      Result Value   Lipase 1,182 (*)    All other components within normal limits  COMPREHENSIVE METABOLIC PANEL - Abnormal; Notable for the following components:   Glucose, Bld 281 (*)    Alkaline Phosphatase 36 (*)    All other components within normal limits  CBC - Abnormal; Notable for the following components:    WBC 18.2 (*)    RBC 5.47 (*)    RDW 18.1 (*)    All other components within normal limits  URINALYSIS, COMPLETE (UACMP) WITH MICROSCOPIC - Abnormal; Notable for the following components:   Color, Urine STRAW (*)    APPearance CLEAR (*)    Glucose, UA >=500 (*)    Ketones, ur 80 (*)    Bacteria, UA RARE (*)    All other components within normal limits   ____________________________________________   EKG  interpreted by me Normal sinus rhythm rate of 62, normal axis and intervals. Normal QRS ST segments and T waves. No acute ischemic changes.  ____________________________________________    RADIOLOGY  Ct Abdomen Pelvis W Contrast  Result Date: 03/18/2018 CLINICAL DATA:  Abdominal pain with nausea and vomiting. EXAM: CT ABDOMEN AND PELVIS WITH CONTRAST TECHNIQUE: Multidetector CT imaging of the abdomen and pelvis was performed using the standard protocol following bolus administration of intravenous contrast. CONTRAST:  69mL ISOVUE-370 IOPAMIDOL (ISOVUE-370) INJECTION 76% COMPARISON:  None. FINDINGS: Lower chest: Dependent atelectasis noted in the lung bases. Dense calcification evident in the mitral annulus. Hepatobiliary: No focal abnormality within the liver parenchyma. There is no evidence for gallstones, gallbladder wall thickening, or pericholecystic fluid. No intrahepatic or extrahepatic biliary dilation. Pancreas: Edema/inflammation is identified in the anterior pararenal space of the retroperitoneal tissues. This surrounds the pancreas diffusely. Pancreas enhances throughout. No dilatation of the main pancreatic duct. Spleen: No splenomegaly. No focal mass lesion. Adrenals/Urinary Tract: No adrenal nodule or mass. 2 cm cyst identified in the interpolar left kidney. Another 1.5 cm probable cyst is identified towards the lower pole left kidney. Additional tiny hypoattenuating lesions in each kidney are too small to characterize but are likely cysts. No evidence for hydroureter. The  urinary bladder appears normal for the degree of distention. Stomach/Bowel: Stomach is nondistended. No gastric wall thickening. No evidence of outlet obstruction. There is edema around the duodenum with a focal area of  proximal duodenal wall thickening (axial image 36/series 2 and visible on coronal image 37 / series 5). Duodenum otherwise unremarkable. No small bowel wall thickening. No small bowel dilatation. The terminal ileum is normal. The appendix is normal. Left colon is decompressed with minimal diverticular change. Vascular/Lymphatic: There is abdominal aortic atherosclerosis without aneurysm. There is no gastrohepatic or hepatoduodenal ligament lymphadenopathy. No intraperitoneal or retroperitoneal lymphadenopathy. 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 No pelvic sidewall lymphadenopathy. Reproductive: Uterus surgically absent.  There is no adnexal mass. Other: No intraperitoneal free fluid. Musculoskeletal: Bone windows reveal no worrisome lytic or sclerotic osseous lesions. IMPRESSION: 1. Diffuse edema/inflammation in the retroperitoneal space of the abdomen. This surrounds the pancreas and although the pancreas does not appear substantially edematous, imaging features are felt to be most likely related to pancreatitis given the distribution. Pancreatic parenchyma enhances throughout and there is no dilatation of the main pancreatic duct. 2. The edema/inflammation tracks up medially along the first and proximal second portions of the duodenum. There is a focal area of wall thickening along the lateral wall of the proximal duodenum which may represent a fold although ulcer or other duodenal lesion cannot be excluded. This does not appear to represent the epicenter of the edema/inflammation. Follow-up upper endoscopy may be warranted to further evaluate. 3. No evidence for gallstones by CT imaging. No intra or extrahepatic biliary duct dilatation. 4.  Aortic Atherosclerois  (ICD10-170.0) Electronically Signed   By: Misty Stanley M.D.   On: 03/18/2018 15:43    ____________________________________________   PROCEDURES Procedures  ____________________________________________  DIFFERENTIAL DIAGNOSIS   cholecystitis, choledocholithiasis, abdominal aortic aneurysm, pancreatitis, bowel perforation, bowel obstruction  CLINICAL IMPRESSION / ASSESSMENT AND PLAN / ED COURSE  Pertinent labs & imaging results that were available during my care of the patient were reviewed by me and considered in my medical decision making (see chart for details).      Clinical Course as of Mar 19 1547  Thu Mar 18, 2018  1458 Diffuse upper abd pain. Negative murphys.  Concern for cholecystitis vs AAA vs bowel perf or obstruction. Will get CT a/p to further evaluate. IV morphine and zofran for symptom control.   [PS]  5038 CT images reviewed. Negative for AAA, dissection or SMA occlusion. Good arterial phase of abd. Vasculature. Lipase resulted, severely high c/w pancreatitis.    [PS]    Clinical Course User Index [PS] Carrie Mew, MD     ----------------------------------------- 3:50 PM on 03/18/2018 -----------------------------------------  CT report confirms pancreatitis and no other identifiable pathology. I will discuss the case with the hospitalist for further management. She has ketones in her urine so even though she is hyperglycemic I will give her dextrose containing fluid. She may require insulin supplementation in the setting of acute pancreatic dysfunction.  ____________________________________________   FINAL CLINICAL IMPRESSION(S) / ED DIAGNOSES    Final diagnoses:  Acute pancreatitis without infection or necrosis, unspecified pancreatitis type     ED Discharge Orders    None      Portions of this note were generated with dragon dictation software. Dictation errors may occur despite best attempts at proofreading.    Carrie Mew,  MD 03/18/18 [phone]

## 2018-03-18 NOTE — ED Triage Notes (Signed)
Pt comes into the ED via POV c/o abdominal pain and nausea that started this morning at 8 am. Patient states she has vomited over 10 times and there is no relief with her abdominal pain after vomiting.  Patient denies any chest pain, shortness of breath, or fevers at home.  Patient denies being around anyone sick recently.  Patient in NAD at this time with even and unlabored respirations.

## 2018-03-18 NOTE — ED Notes (Signed)
Patient transported to CT 

## 2018-03-18 NOTE — Progress Notes (Signed)
Advanced care plan.  Purpose of the Encounter: CODE STATUS  Parties in Attendance:Patient and family Patient's Decision Capacity:Good Subjective/Patient's story: Presented to the emergency room with abdominal pain, nausea and vomiting Objective/Medical story Has acute pancreatitis and duodenitis Needs IV fluids, IV pain medication and gastroenterology evaluation Goals of care determination:  Advanced directives discussed with patient and family For now they want everything to be done which includes cardiac resuscitation, intubation and ventilator if the need arises CODE STATUS: Full code Time spent discussing advanced care planning: 16 minutes

## 2018-03-18 NOTE — ED Triage Notes (Signed)
FIRST NURSE NOTE-vomiting since 8 am this morning per family. Unable to keep anything down. Alert and this time. In wheelchair.

## 2018-03-18 NOTE — H&P (Signed)
[facility] [place] NAME: Molly [name]    MR#:  [id number]  DATE OF BIRTH:  21-Jun-1941  DATE OF ADMISSION:  03/18/2018  PRIMARY CARE PHYSICIAN: Idelle Crouch, MD   REQUESTING/REFERRING PHYSICIAN:   CHIEF COMPLAINT:   Chief Complaint  Patient presents with  . Abdominal Pain    HISTORY OF PRESENT ILLNESS: Molly [name]  is a 77 y.o. female with a known history of skin cancer, diabetes mellitus type 2, familial adenomatous polyposis of colon, GERD, hyperlipidemia, osteoporosis, irritable bowel syndrome presented to the emergency room with abdominal pain.  Abdominal pain started 8 AM this morning.  Is located in the epigastrium as well as extends like a band across the abdomen.  The pain initially was 10 out of 10 on a scale of 1-10.  Patient was evaluated in the emergency room her lipase level was elevated.  She never has history of pancreatitis.  Drinks alcohol occasionally.  She was worked up with CT abdomen in the emergency room which showed inflammation around the pancreas and also questionable duodenal ulcer with edema.  No vomiting of blood.  Had nausea and vomiting since morning.  Hospitalist service was consulted for further care.  PAST MEDICAL HISTORY:   Past Medical History:  Diagnosis Date  . Arthritis   . Cancer ([place])    skin  . Diabetes mellitus, type 2 ([place])   . FH: colonic polyps    hx of-adenomatous  . GERD (gastroesophageal reflux disease)   . Heart murmur    dx'd in 1960s. followed by PCP  . Hyperlipidemia   . Hypertension   . IBS (irritable bowel syndrome)   . Osteoporosis     PAST SURGICAL HISTORY:  Past Surgical History:  Procedure Laterality Date  . ABDOMINAL HYSTERECTOMY    . Carotids  3/08   mild only  . CATARACT EXTRACTION W/PHACO Right 06/29/2017   Procedure: CATARACT EXTRACTION PHACO AND INTRAOCULAR LENS PLACEMENT ([place])  Toric Diabetic Right;  Surgeon: Eulogio Bear, MD;  Location: [facility];  Service: Ophthalmology;  Laterality: Right;  diabetic - oral meds  . CATARACT EXTRACTION W/PHACO Right 07/27/2017   Procedure: CATARACT EXTRACTION PHACO AND INTRAOCULAR LENS PLACEMENT ([place]) LEFT DIABETIC TORIC;  Surgeon: Eulogio Bear, MD;  Location: [facility];  Service: Ophthalmology;  Laterality: Right;  Diabetic - oral meds  . cystocele, cystourethropexy  12/87  . dexa     increase T - 3.1 spine, normal femur 1/04  . hysterectomy (other)  1980  . TUBAL LIGATION  1978  . vaginal deliveries     x2    SOCIAL HISTORY:  Social History   Tobacco Use  . Smoking status: Former Smoker    Last attempt to quit: 1992    Years since quitting: 27.3  . Smokeless tobacco: Never Used  Substance Use Topics  . Alcohol use: Yes    Comment: Occ -1x/mo    FAMILY HISTORY:  Family History  Problem Relation Age of Onset  . Heart failure Mother   . Coronary artery disease Mother   . Heart attack Mother 98  . Diabetes Mother   . Heart failure Father   . Coronary artery disease Father   . Breast cancer Neg Hx     DRUG ALLERGIES:  Allergies  Allergen Reactions  . Alendronate Nausea Only  . Codeine Nausea Only  . Lansoprazole     REACTION: hurts stomach  . Sulfonamide Derivatives  Childhood reaction (doesn't remember)  . Adhesive [Tape] Rash    Blisters if on too long    REVIEW OF SYSTEMS:   CONSTITUTIONAL: No fever, fatigue or weakness.  EYES: No blurred or double vision.  EARS, NOSE, AND THROAT: No tinnitus or ear pain.  RESPIRATORY: No cough, shortness of breath, wheezing or hemoptysis.  CARDIOVASCULAR: No chest pain, orthopnea, edema.  GASTROINTESTINAL: Has nausea, vomiting, abdominal pain.  No diarrhea. GENITOURINARY: No dysuria, hematuria.  ENDOCRINE: No polyuria, nocturia,  HEMATOLOGY: No anemia, easy bruising or bleeding SKIN: No rash or lesion. MUSCULOSKELETAL: No joint pain or arthritis.   NEUROLOGIC: No tingling, numbness, weakness.   PSYCHIATRY: No anxiety or depression.   MEDICATIONS AT HOME:  Prior to Admission medications   Medication Sig Start Date End Date Taking? Authorizing Provider  aspirin 81 MG tablet Take 81 mg by mouth daily.    [provider]  Biotin 2500 MCG CAPS Take by mouth 2 (two) times daily.    [provider]  CALCIUM PO Take 750 mg by mouth 2 (two) times daily.    [provider]  canagliflozin (INVOKANA) 300 MG TABS tablet Take 300 mg by mouth daily.    [provider]  Cholecalciferol (VITAMIN D PO) Take 500 Units by mouth 2 (two) times daily.    [provider]  glipiZIDE (GLUCOTROL) 10 MG 24 hr tablet TAKE 1 TABLET BY MOUTH EVERY DAY Patient taking differently: TAKE 1 TABLET BY MOUTH EVERY DAY (Pt takes BID) 03/23/11   Lucille Passy, MD  glucose blood (ACCU-CHEK COMPACT STRIPS) test strip 1 each by Other route as directed. Use as instructed     [provider]  Lancet Devices [facility]) lancets 1 each by Other route as needed. Use as instructed     [provider]  lisinopril (PRINIVIL,ZESTRIL) 10 MG tablet Take 10 mg by mouth daily.    [provider]  metFORMIN (GLUCOPHAGE) 1000 MG tablet TAKE 1 TABLET BY MOUTH TWICE DAILY 11/18/11   Lucille Passy, MD  metoprolol (LOPRESSOR) 50 MG tablet TAKE 1 TABLET BY MOUTH TWICE DAILY 12/14/12   Evans Lance, MD  Multiple Vitamins-Minerals (MULTIVITAL) tablet Take 1 tablet by mouth daily.      [provider]  Omega-3 Fatty Acids (FISH OIL CONCENTRATE) 1000 MG CAPS Take by mouth daily. Take 3     [provider]  omeprazole (PRILOSEC) 20 MG capsule TAKE ONE CAPSULE BY MOUTH EVERY DAY 02/26/11   Lucille Passy, MD  simvastatin (ZOCOR) 10 MG tablet Take 1 tablet (10 mg total) by mouth at bedtime. 12/16/11   Lucille Passy, MD  traZODone (DESYREL) 50 MG tablet Take 50 mg by mouth at bedtime. Take 1-2     [provider]  VITAMIN K PO Take 40 mcg by mouth 2  (two) times daily.    [provider]      PHYSICAL EXAMINATION:   VITAL SIGNS: Blood pressure (!) 168/54, pulse 68, temperature 97.6 F (36.4 C), temperature source Oral, resp. rate 16, height 5\' 6"  (1.676 m), weight 72.6 kg (160 lb), SpO2 99 %.  GENERAL:  77 y.o.-year-old patient lying in the bed with no acute distress.  EYES: Pupils equal, round, reactive to light and accommodation. No scleral icterus. Extraocular muscles intact.  HEENT: Head atraumatic, normocephalic. Oropharynx dry and nasopharynx clear.  NECK:  Supple, no jugular venous distention. No thyroid enlargement, no tenderness.  LUNGS: Normal breath sounds bilaterally, no wheezing,  rales,rhonchi or crepitation. No use of accessory muscles of respiration.  CARDIOVASCULAR: S1, S2 normal. No murmurs, rubs, or gallops.  ABDOMEN: Soft, tenderness epigastrium and right upper quadrant, nondistended. Bowel sounds present. No organomegaly or mass.  EXTREMITIES: No pedal edema, cyanosis, or clubbing.  NEUROLOGIC: Cranial nerves II through XII are intact. Muscle strength 5/5 in all extremities. Sensation intact. Gait not checked.  PSYCHIATRIC: The patient is alert and oriented x 3.  SKIN: No obvious rash, lesion, or ulcer.   LABORATORY PANEL:   CBC Recent Labs  Lab 03/18/18 1210  WBC 18.2*  HGB 14.3  HCT 44.1  PLT 303  MCV 80.6  MCH 26.2  MCHC 32.5  RDW 18.1*   ------------------------------------------------------------------------------------------------------------------  Chemistries  Recent Labs  Lab 03/18/18 1210  NA 141  K 4.2  CL 104  CO2 24  GLUCOSE 281*  BUN 16  CREATININE 0.54  CALCIUM 9.4  AST 22  ALT 15  ALKPHOS 36*  BILITOT 0.8   ------------------------------------------------------------------------------------------------------------------ estimated creatinine clearance is 61 mL/min (by C-G formula based on SCr of 0.54  mg/dL). ------------------------------------------------------------------------------------------------------------------ No results for input(s): TSH, T4TOTAL, T3FREE, THYROIDAB in the last 72 hours.  Invalid input(s): FREET3   Coagulation profile No results for input(s): INR, PROTIME in the last 168 hours. ------------------------------------------------------------------------------------------------------------------- No results for input(s): DDIMER in the last 72 hours. -------------------------------------------------------------------------------------------------------------------  Cardiac Enzymes No results for input(s): CKMB, TROPONINI, MYOGLOBIN in the last 168 hours.  Invalid input(s): CK ------------------------------------------------------------------------------------------------------------------ Invalid input(s): POCBNP  ---------------------------------------------------------------------------------------------------------------  Urinalysis    Component Value Date/Time   COLORURINE STRAW (A) 03/18/2018 1450   APPEARANCEUR CLEAR (A) 03/18/2018 1450   LABSPEC 1.025 03/18/2018 1450   PHURINE 5.0 03/18/2018 1450   GLUCOSEU >=500 (A) 03/18/2018 1450   HGBUR NEGATIVE 03/18/2018 1450   BILIRUBINUR NEGATIVE 03/18/2018 1450   KETONESUR 80 (A) 03/18/2018 1450   PROTEINUR NEGATIVE 03/18/2018 1450   NITRITE NEGATIVE 03/18/2018 1450   LEUKOCYTESUR NEGATIVE 03/18/2018 1450     RADIOLOGY: Ct Abdomen Pelvis W Contrast  Result Date: 03/18/2018 CLINICAL DATA:  Abdominal pain with nausea and vomiting. EXAM: CT ABDOMEN AND PELVIS WITH CONTRAST TECHNIQUE: Multidetector CT imaging of the abdomen and pelvis was performed using the standard protocol following bolus administration of intravenous contrast. CONTRAST:  45mL ISOVUE-370 IOPAMIDOL (ISOVUE-370) INJECTION 76% COMPARISON:  None. FINDINGS: Lower chest: Dependent atelectasis noted in the lung bases. Dense calcification  evident in the mitral annulus. Hepatobiliary: No focal abnormality within the liver parenchyma. There is no evidence for gallstones, gallbladder wall thickening, or pericholecystic fluid. No intrahepatic or extrahepatic biliary dilation. Pancreas: Edema/inflammation is identified in the anterior pararenal space of the retroperitoneal tissues. This surrounds the pancreas diffusely. Pancreas enhances throughout. No dilatation of the main pancreatic duct. Spleen: No splenomegaly. No focal mass lesion. Adrenals/Urinary Tract: No adrenal nodule or mass. 2 cm cyst identified in the interpolar left kidney. Another 1.5 cm probable cyst is identified towards the lower pole left kidney. Additional tiny hypoattenuating lesions in each kidney are too small to characterize but are likely cysts. No evidence for hydroureter. The urinary bladder appears normal for the degree of distention. Stomach/Bowel: Stomach is nondistended. No gastric wall thickening. No evidence of outlet obstruction. There is edema around the duodenum with a focal area of proximal duodenal wall thickening (axial image 36/series 2 and visible on coronal image 37 / series 5). Duodenum otherwise unremarkable. No small bowel wall thickening. No small bowel dilatation. The terminal ileum is normal. The appendix is normal. Left colon is  decompressed with minimal diverticular change. Vascular/Lymphatic: There is abdominal aortic atherosclerosis without aneurysm. There is no gastrohepatic or hepatoduodenal ligament lymphadenopathy. No intraperitoneal or retroperitoneal lymphadenopathy. 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 No pelvic sidewall lymphadenopathy. Reproductive: Uterus surgically absent.  There is no adnexal mass. Other: No intraperitoneal free fluid. Musculoskeletal: Bone windows reveal no worrisome lytic or sclerotic osseous lesions. IMPRESSION: 1. Diffuse edema/inflammation in the retroperitoneal space of the abdomen. This  surrounds the pancreas and although the pancreas does not appear substantially edematous, imaging features are felt to be most likely related to pancreatitis given the distribution. Pancreatic parenchyma enhances throughout and there is no dilatation of the main pancreatic duct. 2. The edema/inflammation tracks up medially along the first and proximal second portions of the duodenum. There is a focal area of wall thickening along the lateral wall of the proximal duodenum which may represent a fold although ulcer or other duodenal lesion cannot be excluded. This does not appear to represent the epicenter of the edema/inflammation. Follow-up upper endoscopy may be warranted to further evaluate. 3. No evidence for gallstones by CT imaging. No intra or extrahepatic biliary duct dilatation. 4.  Aortic Atherosclerois (ICD10-170.0) Electronically Signed   By: Misty Stanley M.D.   On: 03/18/2018 15:43    EKG: Orders placed or performed during the hospital encounter of 03/18/18  . ED EKG  . ED EKG    IMPRESSION AND PLAN:  Molly [name]  is a 77 y.o. female with a known history of skin cancer, diabetes mellitus type 2, familial adenomatous polyposis of colon, GERD, hyperlipidemia, osteoporosis, irritable bowel syndrome presented to the emergency room with abdominal pain.  -Acute pancreatitis Keep patient n.p.o. IV fluid hydration with Ringer lactate Gastroenterology consultation Repeat lipase level Check triglyceride levels  -Acute duodenitis with ulceration IV Protonix 40 mg every 12 hourly Expert gastroenterology evaluation  -Retroperitoneal inflammation secondary to pancreatitis  -Acute abdominal pain secondary to pancreatitis Control pain with IV Dilaudid as needed  -DVT prophylaxis subcu Lovenox daily  -Diabetes mellitus type 2 Sliding scale coverage with insulin      All the records are reviewed and case discussed with ED provider. Management plans discussed with the patient, family and  they are in agreement.  CODE STATUS:Full code    TOTAL TIME TAKING CARE OF THIS PATIENT: 53 minutes.    Saundra Shelling M.D on 03/18/2018 at 4:32 PM  Between 7am to 6pm - Pager - [phone]  After 6pm go to [email or web address] - password [email or web address] [facility]  Office  [phone]  CC: Primary care physician; Idelle Crouch, MD

## 2018-03-19 DIAGNOSIS — K853 Drug induced acute pancreatitis without necrosis or infection: Secondary | ICD-10-CM

## 2018-03-19 LAB — LIPID PANEL
Cholesterol: 169 mg/dL (ref 0–200)
HDL: 59 mg/dL (ref >40)
LDL CALC: 93 mg/dL (ref 0–99)
Total CHOL/HDL Ratio: 2.9 RATIO
Triglycerides: 83 mg/dL (ref <150)
VLDL: 17 mg/dL (ref 0–40)

## 2018-03-19 LAB — COMPREHENSIVE METABOLIC PANEL
ALT: 13 U/L — AB (ref 14–54)
AST: 20 U/L (ref 15–41)
Albumin: 4 g/dL (ref 3.5–5.0)
Alkaline Phosphatase: 34 U/L — ABNORMAL LOW (ref 38–126)
Anion gap: 13 (ref 5–15)
BILIRUBIN TOTAL: 0.9 mg/dL (ref 0.3–1.2)
BUN: 15 mg/dL (ref 6–20)
CHLORIDE: 110 mmol/L (ref 101–111)
CO2: 22 mmol/L (ref 22–32)
CREATININE: 0.43 mg/dL — AB (ref 0.44–1.00)
Calcium: 8 mg/dL — ABNORMAL LOW (ref 8.9–10.3)
GFR calc Af Amer: >60 mL/min (ref >60)
GLUCOSE: 136 mg/dL — AB (ref 65–99)
Potassium: 3.9 mmol/L (ref 3.5–5.1)
Sodium: 145 mmol/L (ref 135–145)
TOTAL PROTEIN: 7.1 g/dL (ref 6.5–8.1)

## 2018-03-19 LAB — PROTIME-INR
INR: 1.13
Prothrombin Time: 14.4 seconds (ref 11.4–15.2)

## 2018-03-19 LAB — CBC
HCT: 42.9 % (ref 35.0–47.0)
Hemoglobin: 13.7 g/dL (ref 12.0–16.0)
MCH: 26 pg (ref 26.0–34.0)
MCHC: 32.1 g/dL (ref 32.0–36.0)
MCV: 81 fL (ref 80.0–100.0)
PLATELETS: 275 10*3/uL (ref 150–440)
RBC: 5.29 MIL/uL — ABNORMAL HIGH (ref 3.80–5.20)
RDW: 17.9 % — AB (ref 11.5–14.5)
WBC: 19.4 10*3/uL — ABNORMAL HIGH (ref 3.6–11.0)

## 2018-03-19 LAB — GLUCOSE, CAPILLARY
GLUCOSE-CAPILLARY: 123 mg/dL — AB (ref 65–99)
GLUCOSE-CAPILLARY: 113 mg/dL — AB (ref 65–99)
GLUCOSE-CAPILLARY: 134 mg/dL — AB (ref 65–99)
Glucose-Capillary: 99 mg/dL (ref 65–99)

## 2018-03-19 MED ORDER — SODIUM CHLORIDE 0.9 % IV SOLN: INTRAVENOUS | Status: DC

## 2018-03-19 MED ORDER — PROMETHAZINE HCL 25 MG/ML IJ SOLN
12.5000 mg | Freq: Four times a day (QID) | INTRAMUSCULAR | Status: DC | PRN
  Administered 2018-03-19 – 2018-03-20 (×3): 12.5 mg via INTRAVENOUS
  Filled 2018-03-19 (×3): qty 1

## 2018-03-19 NOTE — Progress Notes (Addendum)
[place] at [facility]                                                                                                                                                                                  Patient Demographics   Molly [name], is a 77 y.o. female, DOB - 1941/06/27, YBO:[id number]  Admit date - 03/18/2018   Admitting Physician Saundra Shelling, MD  Outpatient Primary MD for the patient is Idelle Crouch, MD   LOS - 1  Subjective: Patient admitted with acute pancreatitis, He continues to have a lot of epigastric pain On further questioning patient reports that she used to be on invokana and she was recently changed to [place] in the past 1 month. Patient otherwise denies any alcohol use  Review of Systems:   CONSTITUTIONAL: No documented fever. No fatigue, weakness. No weight gain, no weight loss.  EYES: No blurry or double vision.  ENT: No tinnitus. No postnasal drip. No redness of the oropharynx.  RESPIRATORY: No cough, no wheeze, no hemoptysis. No dyspnea.  CARDIOVASCULAR: No chest pain. No orthopnea. No palpitations. No syncope.  GASTROINTESTINAL: No nausea, no vomiting or diarrhea.  Positive abdominal pain. No melena or hematochezia.  GENITOURINARY: No dysuria or hematuria.  ENDOCRINE: No polyuria or nocturia. No heat or cold intolerance.  HEMATOLOGY: No anemia. No bruising. No bleeding.  INTEGUMENTARY: No rashes. No lesions.  MUSCULOSKELETAL: No arthritis. No swelling. No gout.  NEUROLOGIC: No numbness, tingling, or ataxia. No seizure-type activity.  PSYCHIATRIC: No anxiety. No insomnia. No ADD.    Vitals:   Vitals:   03/18/18 1755 03/18/18 1950 03/19/18 0545 03/19/18 1247  BP: (!) 165/62 (!) 150/61 (!) 146/56 (!) 162/63  Pulse: 68 66 79 80  Resp:  20 18 10   Temp: 97.7 F (36.5 C) 98.5 F (36.9 C) 98.9 F (37.2 C) 97.9 F (36.6 C)  TempSrc: Oral Oral Oral Oral  SpO2: 95% 95% 95% 96%  Weight:      Height:        Wt Readings  from Last 3 Encounters:  03/18/18 72.6 kg (160 lb)  07/27/17 73.9 kg (163 lb)  06/29/17 73.5 kg (162 lb)     Intake/Output Summary (Last 24 hours) at 03/19/2018 1450 Last data filed at 03/19/2018 1300 Gross per 24 hour  Intake 1881.25 ml  Output 1525 ml  Net 356.25 ml    Physical Exam:   GENERAL: Pleasant-appearing in no apparent distress.  HEAD, EYES, EARS, NOSE AND THROAT: Atraumatic, normocephalic. Extraocular muscles are intact. Pupils equal and reactive to light. Sclerae anicteric. No conjunctival injection. No oro-pharyngeal erythema.  NECK: Supple. There is no jugular venous distention. No  bruits, no lymphadenopathy, no thyromegaly.  HEART: Regular rate and rhythm,. No murmurs, no rubs, no clicks.  LUNGS: Clear to auscultation bilaterally. No rales or rhonchi. No wheezes.  ABDOMEN: Soft, flat, epigastric tenderness, nondistended. Has good bowel sounds. No hepatosplenomegaly appreciated.  EXTREMITIES: No evidence of any cyanosis, clubbing, or peripheral edema.  +2 pedal and radial pulses bilaterally.  NEUROLOGIC: The patient is alert, awake, and oriented x3 with no focal motor or sensory deficits appreciated bilaterally.  SKIN: Moist and warm with no rashes appreciated.  Psych: Not anxious, depressed LN: No inguinal LN enlargement    Antibiotics   Anti-infectives (From admission, onward)   None      Medications   Scheduled Meds: . enoxaparin (LOVENOX) injection  40 mg Subcutaneous Q24H  . insulin aspart  0-9 Units Subcutaneous TID WC   Continuous Infusions: . famotidine (PEPCID) IV Stopped (03/19/18 0930)  . lactated ringers 125 mL/hr at 03/19/18 0215   PRN Meds:.acetaminophen **OR** acetaminophen, HYDROmorphone (DILAUDID) injection, ondansetron **OR** ondansetron (ZOFRAN) IV, promethazine   Data Review:   Micro Results No results found for this or any previous visit (from the past 240 hour(s)).  Radiology Reports Ct Abdomen Pelvis W Contrast  Result  Date: 03/18/2018 CLINICAL DATA:  Abdominal pain with nausea and vomiting. EXAM: CT ABDOMEN AND PELVIS WITH CONTRAST TECHNIQUE: Multidetector CT imaging of the abdomen and pelvis was performed using the standard protocol following bolus administration of intravenous contrast. CONTRAST:  51mL ISOVUE-370 IOPAMIDOL (ISOVUE-370) INJECTION 76% COMPARISON:  None. FINDINGS: Lower chest: Dependent atelectasis noted in the lung bases. Dense calcification evident in the mitral annulus. Hepatobiliary: No focal abnormality within the liver parenchyma. There is no evidence for gallstones, gallbladder wall thickening, or pericholecystic fluid. No intrahepatic or extrahepatic biliary dilation. Pancreas: Edema/inflammation is identified in the anterior pararenal space of the retroperitoneal tissues. This surrounds the pancreas diffusely. Pancreas enhances throughout. No dilatation of the main pancreatic duct. Spleen: No splenomegaly. No focal mass lesion. Adrenals/Urinary Tract: No adrenal nodule or mass. 2 cm cyst identified in the interpolar left kidney. Another 1.5 cm probable cyst is identified towards the lower pole left kidney. Additional tiny hypoattenuating lesions in each kidney are too small to characterize but are likely cysts. No evidence for hydroureter. The urinary bladder appears normal for the degree of distention. Stomach/Bowel: Stomach is nondistended. No gastric wall thickening. No evidence of outlet obstruction. There is edema around the duodenum with a focal area of proximal duodenal wall thickening (axial image 36/series 2 and visible on coronal image 37 / series 5). Duodenum otherwise unremarkable. No small bowel wall thickening. No small bowel dilatation. The terminal ileum is normal. The appendix is normal. Left colon is decompressed with minimal diverticular change. Vascular/Lymphatic: There is abdominal aortic atherosclerosis without aneurysm. There is no gastrohepatic or hepatoduodenal ligament  lymphadenopathy. No intraperitoneal or retroperitoneal lymphadenopathy. 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 No pelvic sidewall lymphadenopathy. Reproductive: Uterus surgically absent.  There is no adnexal mass. Other: No intraperitoneal free fluid. Musculoskeletal: Bone windows reveal no worrisome lytic or sclerotic osseous lesions. IMPRESSION: 1. Diffuse edema/inflammation in the retroperitoneal space of the abdomen. This surrounds the pancreas and although the pancreas does not appear substantially edematous, imaging features are felt to be most likely related to pancreatitis given the distribution. Pancreatic parenchyma enhances throughout and there is no dilatation of the main pancreatic duct. 2. The edema/inflammation tracks up medially along the first and proximal second portions of the duodenum. There is a focal  area of wall thickening along the lateral wall of the proximal duodenum which may represent a fold although ulcer or other duodenal lesion cannot be excluded. This does not appear to represent the epicenter of the edema/inflammation. Follow-up upper endoscopy may be warranted to further evaluate. 3. No evidence for gallstones by CT imaging. No intra or extrahepatic biliary duct dilatation. 4.  Aortic Atherosclerois (ICD10-170.0) Electronically Signed   By: Misty Stanley M.D.   On: 03/18/2018 15:43     CBC Recent Labs  Lab 03/18/18 1210 03/19/18 0352  WBC 18.2* 19.4*  HGB 14.3 13.7  HCT 44.1 42.9  PLT 303 275  MCV 80.6 81.0  MCH 26.2 26.0  MCHC 32.5 32.1  RDW 18.1* 17.9*    Chemistries  Recent Labs  Lab 03/18/18 1210 03/19/18 0352  NA 141 145  K 4.2 3.9  CL 104 110  CO2 24 22  GLUCOSE 281* 136*  BUN 16 15  CREATININE 0.54 0.43*  CALCIUM 9.4 8.0*  AST 22 20  ALT 15 13*  ALKPHOS 36* 34*  BILITOT 0.8 0.9   ------------------------------------------------------------------------------------------------------------------ estimated creatinine  clearance is 61 mL/min (A) (by C-G formula based on SCr of 0.43 mg/dL (L)). ------------------------------------------------------------------------------------------------------------------ No results for input(s): HGBA1C in the last 72 hours. ------------------------------------------------------------------------------------------------------------------ Recent Labs    03/19/18 0352  CHOL 169  HDL 59  LDLCALC 93  TRIG 83  CHOLHDL 2.9   ------------------------------------------------------------------------------------------------------------------ No results for input(s): TSH, T4TOTAL, T3FREE, THYROIDAB in the last 72 hours.  Invalid input(s): FREET3 ------------------------------------------------------------------------------------------------------------------ No results for input(s): VITAMINB12, FOLATE, FERRITIN, TIBC, IRON, RETICCTPCT in the last 72 hours.  Coagulation profile Recent Labs  Lab 03/19/18 0352  INR 1.13    No results for input(s): DDIMER in the last 72 hours.  Cardiac Enzymes No results for input(s): CKMB, TROPONINI, MYOGLOBIN in the last 168 hours.  Invalid input(s): CK ------------------------------------------------------------------------------------------------------------------ Invalid input(s): POCBNP    Assessment & Plan   IMPRESSION AND PLAN:  Molly [name]  is a 77 y.o. female with a known history of skin cancer, diabetes mellitus type 2, familial adenomatous polyposis of colon, GERD, hyperlipidemia, osteoporosis, irritable bowel syndrome presented to the emergency room with abdominal pain.  -Acute pancreatitis This is related to [place],  She does not drink LFTs are normal and common bile duct size is normal all suggestive of gallstone is not being the cause Keep patient n.p.o. IV fluid hydration with Ringer lactate Appreciate gastroenterology consultation Repeat lipase level in the a.m. Check triglyceride levels  -Acute duodenitis  suspect related to acute pancreatitis continue V Protonix 40 mg every 12 hourly Expert gastroenterology evaluation  -Leukocytosis due to acute pancreatitis continue to follow WBC  -Acute abdominal pain secondary to pancreatitis Continue IV Dilaudid as needed  -Diabetes mellitus type 2 Sliding scale coverage with insulin Patient will need alternative medication then Jardiance        Code Status Orders  (From admission, onward)        Start     Ordered   03/18/18 1747  Full code  Continuous     03/18/18 1746    Code Status History    This patient has a current code status but no historical code status.    Advance Directive Documentation     Most Recent Value  Type of Advance Directive  Healthcare Power of Attorney, Living will  Pre-existing out of facility DNR order (yellow form or pink MOST form)  -  "MOST" Form in Place?  -  Consults gastroenterology  DVT Prophylaxis  Lovenox  Lab Results  Component Value Date   PLT 275 03/19/2018     Time Spent in minutes  93min  Greater than 50% of time spent in care coordination and counseling patient regarding the condition and plan of care.   Dustin Flock M.D on 03/19/2018 at 2:50 PM  Between 7am to 6pm - Pager - [phone]  After 6pm go to [email or web address] - Proofreader  [employer]   Office  [phone]

## 2018-03-19 NOTE — Consult Note (Signed)
Molly [name] , MD [address] [place], [place], [place], [address] [id number] [place], [place], [place], [address] Phone: [phone]  Fax: [phone]  Consultation  Referring Provider: DR Posey Pronto  Primary Care Physician:  Molly Crouch, MD Primary Gastroenterologist: None          Reason for Consultation:     Pancreatitis   Date of Admission:  03/18/2018 Date of Consultation:  03/19/2018         HPI:   Molly [name] is a 77 y.o. female diabetes, familial adenomatous polyposis of the colon, GERD, hyperlipidemia, osteoporosis presented emergency room yesterday with abdominal pain.  No history of prior pancreatitis.  In the emergency room she underwent some lab work which demonstrated a lipase of 1182.  Her liver function tests were completely normal.  She had an elevated white cell count of 18,000 and a hemoglobin of 14.3 g.  Her triglycerides on admission were only 83.  She had a CT scan of the abdomen which demonstrated diffuse edema and inflammation in the retroperitoneal space, surrounding the pancreas felt likely secondary to pancreatitis.  There is no dilation of the pancreatic duct.  There was inflammation along the first and second portions of the duodenum.  No evidence of gallstones seen on CT scan.  She says the abdominal pain began all of a sudden yesterday with central abdominal pain , nausea and vomiting . Denies any alcohol use, no issues with gall stones in the past . No family history of gall stones. Only new medication has been invokana started a few months back .    Past Medical History:  Diagnosis Date  . Arthritis   . Cancer ([place])    skin  . Diabetes mellitus, type 2 ([place])   . FH: colonic polyps    hx of-adenomatous  . GERD (gastroesophageal reflux disease)   . Heart murmur    dx'd in 1960s. followed by PCP  . Hyperlipidemia   . Hypertension   . IBS (irritable bowel syndrome)   . Osteoporosis     Past Surgical History:  Procedure Laterality Date    . ABDOMINAL HYSTERECTOMY    . Carotids  3/08   mild only  . CATARACT EXTRACTION W/PHACO Right 06/29/2017   Procedure: CATARACT EXTRACTION PHACO AND INTRAOCULAR LENS PLACEMENT ([place])  [place] Diabetic Right;  Surgeon: Eulogio Bear, MD;  Location: [facility];  Service: Ophthalmology;  Laterality: Right;  diabetic - oral meds  . CATARACT EXTRACTION W/PHACO Right 07/27/2017   Procedure: CATARACT EXTRACTION PHACO AND INTRAOCULAR LENS PLACEMENT ([place]) LEFT DIABETIC TORIC;  Surgeon: Eulogio Bear, MD;  Location: [facility];  Service: Ophthalmology;  Laterality: Right;  Diabetic - oral meds  . cystocele, cystourethropexy  12/87  . dexa     increase T - 3.1 spine, normal femur 1/04  . hysterectomy (other)  1980  . TUBAL LIGATION  1978  . vaginal deliveries     x2    Prior to Admission medications   Medication Sig Start Date End Date Taking? Authorizing Provider  aspirin 81 MG tablet Take 81 mg by mouth daily.   Yes [provider]  Biotin 2500 MCG CAPS Take by mouth 2 (two) times daily.   Yes [provider]  CALCIUM PO Take 750 mg by mouth 2 (two) times daily.   Yes [provider]  Cholecalciferol (VITAMIN D PO) Take 500 Units by mouth 2 (two) times daily.   Yes [provider]  glipiZIDE (GLUCOTROL) 10 MG 24 hr tablet TAKE 1 TABLET BY MOUTH EVERY DAY Patient taking differently: TAKE 1 TABLET BY MOUTH EVERY DAY (Pt takes BID) 03/23/11  Yes Molly Passy, MD  JARDIANCE 25 MG TABS tablet Take 25 mg by mouth daily. 02/26/18  Yes [provider]  lisinopril (PRINIVIL,ZESTRIL) 10 MG tablet Take 10 mg by mouth daily.   Yes [provider]  metFORMIN (GLUCOPHAGE) 1000 MG tablet TAKE 1 TABLET BY MOUTH TWICE DAILY 11/18/11  Yes Molly Passy, MD  metFORMIN (GLUCOPHAGE-XR) 500 MG 24 hr tablet Take 1,000 mg by mouth 2 (two) times daily. 02/25/18  Yes [provider]  metoprolol (LOPRESSOR) 50 MG tablet TAKE 1 TABLET BY MOUTH  TWICE DAILY 12/14/12  Yes Evans Lance, MD  Multiple Vitamins-Minerals (MULTIVITAL) tablet Take 1 tablet by mouth daily.     Yes [provider]  Omega-3 Fatty Acids (FISH OIL CONCENTRATE) 1000 MG CAPS Take 2 capsules by mouth daily.    Yes [provider]  omeprazole (PRILOSEC) 20 MG capsule TAKE ONE CAPSULE BY MOUTH EVERY DAY 02/26/11  Yes Molly Passy, MD  simvastatin (ZOCOR) 10 MG tablet Take 1 tablet (10 mg total) by mouth at bedtime. 12/16/11  Yes Molly Passy, MD  VITAMIN K PO Take 40 mcg by mouth 2 (two) times daily.   Yes [provider]  glucose blood (ACCU-CHEK COMPACT STRIPS) test strip 1 each by Other route as directed. Use as instructed     [provider]  Lancet Devices [facility]) lancets 1 each by Other route as needed. Use as instructed     [provider]    Family History  Problem Relation Age of Onset  . Heart failure Mother   . Coronary artery disease Mother   . Heart attack Mother 75  . Diabetes Mother   . Heart failure Father   . Coronary artery disease Father   . Breast cancer Neg Hx      Social History   Tobacco Use  . Smoking status: Former Smoker    Last attempt to quit: 1992    Years since quitting: 27.3  . Smokeless tobacco: Never Used  Substance Use Topics  . Alcohol use: Yes    Comment: Occ -1x/mo  . Drug use: No    Allergies as of 03/18/2018 - Review Complete 03/18/2018  Allergen Reaction Noted  . Alendronate Nausea Only 06/22/2017  . Codeine Nausea Only   . Lansoprazole  09/08/2008  . Sulfonamide derivatives    . Adhesive [tape] Rash 06/22/2017    Review of Systems:    All systems reviewed and negative except where noted in HPI.   Physical Exam:  Vital signs in last 24 hours: Temp:  [97.7 F (36.5 C)-98.9 F (37.2 C)] 97.9 F (36.6 C) (05/10 1247) Pulse Rate:  [66-80] 80 (05/10 1247) Resp:  [10-20] 10 (05/10 1247) BP: (146-168)/(54-63) 162/63 (05/10 1247) SpO2:  [95 %-99 %]  96 % (05/10 1247) Last BM Date: 03/18/18 General:   Pleasant, cooperative in NAD Head:  Normocephalic and atraumatic. Eyes:   No icterus.   Conjunctiva pink. PERRLA. Ears:  Normal auditory acuity. Neck:  Supple; no masses or thyroidomegaly Lungs: Respirations even and unlabored. Lungs clear to auscultation bilaterally.   No wheezes, crackles, or rhonchi.  Heart:  Regular rate and rhythm;  Without murmur, clicks, rubs or gallops Abdomen:  Mild distension , mild epigastric tenderness, no organomegaly, no guarding or rigidity BS+ Neurologic:  Alert and oriented x3;  grossly normal neurologically. Skin:  Intact without significant lesions or rashes. Cervical Nodes:  No significant cervical adenopathy. Psych:  Alert and cooperative. Normal affect.  LAB RESULTS: Recent Labs    03/18/18 1210 03/19/18 0352  WBC 18.2* 19.4*  HGB 14.3 13.7  HCT 44.1 42.9  PLT 303 275   BMET Recent Labs    03/18/18 1210 03/19/18 0352  NA 141 145  K 4.2 3.9  CL 104 110  CO2 24 22  GLUCOSE 281* 136*  BUN 16 15  CREATININE 0.54 0.43*  CALCIUM 9.4 8.0*   LFT Recent Labs    03/19/18 0352  PROT 7.1  ALBUMIN 4.0  AST 20  ALT 13*  ALKPHOS 34*  BILITOT 0.9   PT/INR Recent Labs    03/19/18 0352  LABPROT 14.4  INR 1.13    STUDIES: Ct Abdomen Pelvis W Contrast  Result Date: 03/18/2018 CLINICAL DATA:  Abdominal pain with nausea and vomiting. EXAM: CT ABDOMEN AND PELVIS WITH CONTRAST TECHNIQUE: Multidetector CT imaging of the abdomen and pelvis was performed using the standard protocol following bolus administration of intravenous contrast. CONTRAST:  5mL ISOVUE-370 IOPAMIDOL (ISOVUE-370) INJECTION 76% COMPARISON:  None. FINDINGS: Lower chest: Dependent atelectasis noted in the lung bases. Dense calcification evident in the mitral annulus. Hepatobiliary: No focal abnormality within the liver parenchyma. There is no evidence for gallstones, gallbladder wall thickening, or pericholecystic fluid. No  intrahepatic or extrahepatic biliary dilation. Pancreas: Edema/inflammation is identified in the anterior pararenal space of the retroperitoneal tissues. This surrounds the pancreas diffusely. Pancreas enhances throughout. No dilatation of the main pancreatic duct. Spleen: No splenomegaly. No focal mass lesion. Adrenals/Urinary Tract: No adrenal nodule or mass. 2 cm cyst identified in the interpolar left kidney. Another 1.5 cm probable cyst is identified towards the lower pole left kidney. Additional tiny hypoattenuating lesions in each kidney are too small to characterize but are likely cysts. No evidence for hydroureter. The urinary bladder appears normal for the degree of distention. Stomach/Bowel: Stomach is nondistended. No gastric wall thickening. No evidence of outlet obstruction. There is edema around the duodenum with a focal area of proximal duodenal wall thickening (axial image 36/series 2 and visible on coronal image 37 / series 5). Duodenum otherwise unremarkable. No small bowel wall thickening. No small bowel dilatation. The terminal ileum is normal. The appendix is normal. Left colon is decompressed with minimal diverticular change. Vascular/Lymphatic: There is abdominal aortic atherosclerosis without aneurysm. There is no gastrohepatic or hepatoduodenal ligament lymphadenopathy. No intraperitoneal or retroperitoneal lymphadenopathy. 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 No pelvic sidewall lymphadenopathy. Reproductive: Uterus surgically absent.  There is no adnexal mass. Other: No intraperitoneal free fluid. Musculoskeletal: Bone windows reveal no worrisome lytic or sclerotic osseous lesions. IMPRESSION: 1. Diffuse edema/inflammation in the retroperitoneal space of the abdomen. This surrounds the pancreas and although the pancreas does not appear substantially edematous, imaging features are felt to be most likely related to pancreatitis given the distribution. Pancreatic  parenchyma enhances throughout and there is no dilatation of the main pancreatic duct. 2. The edema/inflammation tracks up medially along the first and proximal second portions of the duodenum. There is a focal area of wall thickening along the lateral wall of the proximal duodenum which may represent a fold although ulcer or other duodenal lesion cannot be excluded. This does not appear to represent the epicenter of the edema/inflammation. Follow-up upper endoscopy may be warranted to further evaluate. 3. No evidence for gallstones  by CT imaging. No intra or extrahepatic biliary duct dilatation. 4.  Aortic Atherosclerois (ICD10-170.0) Electronically Signed   By: Misty Stanley M.D.   On: 03/18/2018 15:43      Impression / Plan:   MALAINA MORTELLARO is a 77 y.o. y/o female admitted with acute pancreatitis.  There is no evidence of biliary pancreatitis based on the lab work.  Is no prior history of alcohol abuse which he can attribute to worse the pancreatitis.  Triglyceride levels are not elevated.  CT scan does not demonstrate any abnormal lesions in the pancreas that could have precipitated the pancreatitis.  She has started on Invokana recently which is a new medication and was rightly pointed out by Dr. Posey Pronto that it could have been a cause for her acute pancreatitis. It has particularly been associated with pancreatitis when used with metformin .   Plan 1.  Stop Invokana 2.  IV fluids recommend Ringer lactate.  She will require a few liters of fluid to help treat the pancreatitis acutely.PResently still appears a bit dry over her mucus membranes.  3.  We can plan to commence her on clears when she feels up to it.  And gradually advance her diet as she can tolerate.  Early feeding has been shown to improve outcomes and acute pancreatitis. 4.  As an outpatient may want to consider an MRI of her abdomen to rule out any pancreatic lesions once all the inflammation has subsided as a precipitant for her  pancreatitis.   Thank you for involving me in the care of this patient.      LOS: 1 day   Molly Bellows, MD  03/19/2018, 1:12 PM

## 2018-03-20 ENCOUNTER — Inpatient Hospital Stay: Payer: Medicare HMO

## 2018-03-20 LAB — BASIC METABOLIC PANEL
Anion gap: 10 (ref 5–15)
BUN: 15 mg/dL (ref 6–20)
CHLORIDE: 108 mmol/L (ref 101–111)
CO2: 20 mmol/L — AB (ref 22–32)
Calcium: 7.4 mg/dL — ABNORMAL LOW (ref 8.9–10.3)
Creatinine, Ser: 0.45 mg/dL (ref 0.44–1.00)
GFR calc non Af Amer: >60 mL/min (ref >60)
Glucose, Bld: 106 mg/dL — ABNORMAL HIGH (ref 65–99)
Potassium: 3.2 mmol/L — ABNORMAL LOW (ref 3.5–5.1)
Sodium: 138 mmol/L (ref 135–145)

## 2018-03-20 LAB — CBC
HEMATOCRIT: 37.6 % (ref 35.0–47.0)
Hemoglobin: 12.1 g/dL (ref 12.0–16.0)
MCH: 26 pg (ref 26.0–34.0)
MCHC: 32.2 g/dL (ref 32.0–36.0)
MCV: 80.7 fL (ref 80.0–100.0)
PLATELETS: 228 10*3/uL (ref 150–440)
RBC: 4.66 MIL/uL (ref 3.80–5.20)
RDW: 17.8 % — ABNORMAL HIGH (ref 11.5–14.5)
WBC: 17.5 10*3/uL — ABNORMAL HIGH (ref 3.6–11.0)

## 2018-03-20 LAB — LIPASE, BLOOD: Lipase: 63 U/L — ABNORMAL HIGH (ref 11–51)

## 2018-03-20 LAB — GLUCOSE, CAPILLARY
Glucose-Capillary: 79 mg/dL (ref 65–99)
Glucose-Capillary: 72 mg/dL (ref 65–99)
Glucose-Capillary: 103 mg/dL — ABNORMAL HIGH (ref 65–99)
Glucose-Capillary: 122 mg/dL — ABNORMAL HIGH (ref 65–99)

## 2018-03-20 MED ORDER — LACTATED RINGERS IV SOLN
INTRAVENOUS | Status: DC
  Administered 2018-03-21: 03:00:00 via INTRAVENOUS

## 2018-03-20 MED ORDER — POTASSIUM CHLORIDE CRYS ER 20 MEQ PO TBCR
40.0000 meq | EXTENDED_RELEASE_TABLET | Freq: Once | ORAL | Status: AC
Start: 2018-03-20 — End: 2018-03-20
  Administered 2018-03-20: 40 meq via ORAL
  Filled 2018-03-20: qty 2

## 2018-03-20 NOTE — Progress Notes (Signed)
Patient ID: Molly [name], female   DOB: July 01, 1941, 77 y.o.   MRN: [id number]  [facility] PROGRESS NOTE  LY WASS PYK:[id number] DOB: [birth year] DOA: 03/18/2018 PCP: Idelle Crouch, MD  HPI/Subjective: Patient still having a lot of pain in her abdomen.  Hardly able to sit up on her own without pain.  Still taking pain medications.  Lipase has come down quite a bit.  Objective: Vitals:   03/20/18 0510 03/20/18 1448  BP: (!) 136/51 (!) 151/61  Pulse: 73 80  Resp: 20 14  Temp: 98.8 F (37.1 C) 98.5 F (36.9 C)  SpO2: 94% 92%    Filed Weights   03/18/18 1209  Weight: 72.6 kg (160 lb)    ROS: Review of Systems  Constitutional: Negative for chills and fever.  Eyes: Negative for blurred vision.  Respiratory: Negative for cough and shortness of breath.   Cardiovascular: Negative for chest pain.  Gastrointestinal: Positive for abdominal pain. Negative for constipation, diarrhea, nausea and vomiting.  Genitourinary: Negative for dysuria.  Musculoskeletal: Negative for joint pain.  Neurological: Negative for dizziness and headaches.   Exam: Physical Exam  HENT:  Nose: No mucosal edema.  Mouth/Throat: No oropharyngeal exudate or posterior oropharyngeal edema.  Eyes: Pupils are equal, round, and reactive to light. Conjunctivae, EOM and lids are normal.  Neck: No JVD present. Carotid bruit is not present. No edema present. No thyroid mass and no thyromegaly present.  Cardiovascular: S1 normal and S2 normal. Exam reveals no gallop.  No murmur heard. Pulses:      Dorsalis pedis pulses are 2+ on the right side, and 2+ on the left side.  Respiratory: No respiratory distress. She has no wheezes. She has no rhonchi. She has no rales.  GI: Soft. Bowel sounds are normal. There is tenderness in the right upper quadrant and epigastric area.  Musculoskeletal:       Right ankle: She exhibits no swelling.       Left ankle: She exhibits no swelling.  Lymphadenopathy:    She has no  cervical adenopathy.  Neurological: She is alert. No cranial nerve deficit.  Skin: Skin is warm. No rash noted. Nails show no clubbing.  Psychiatric: She has a normal mood and affect.      Data Reviewed: Basic Metabolic Panel: Recent Labs  Lab 03/18/18 1210 03/19/18 0352 03/20/18 0553  NA 141 145 138  K 4.2 3.9 3.2*  CL 104 110 108  CO2 24 22 20*  GLUCOSE 281* 136* 106*  BUN 16 15 15   CREATININE 0.54 0.43* 0.45  CALCIUM 9.4 8.0* 7.4*   Liver Function Tests: Recent Labs  Lab 03/18/18 1210 03/19/18 0352  AST 22 20  ALT 15 13*  ALKPHOS 36* 34*  BILITOT 0.8 0.9  PROT 7.7 7.1  ALBUMIN 4.6 4.0   Recent Labs  Lab 03/18/18 1210 03/20/18 0553  LIPASE 1,182* 63*   CBC: Recent Labs  Lab 03/18/18 1210 03/19/18 0352 03/20/18 0553  WBC 18.2* 19.4* 17.5*  HGB 14.3 13.7 12.1  HCT 44.1 42.9 37.6  MCV 80.6 81.0 80.7  PLT 303 275 228    CBG: Recent Labs  Lab 03/19/18 1213 03/19/18 1705 03/19/18 2139 03/20/18 0800 03/20/18 1159  GLUCAP 113* 99 134* 79 72     Studies: Ct Abdomen Pelvis W Contrast  Result Date: 03/18/2018 CLINICAL DATA:  Abdominal pain with nausea and vomiting. EXAM: CT ABDOMEN AND PELVIS WITH CONTRAST TECHNIQUE: Multidetector CT imaging of the abdomen and pelvis was performed  using the standard protocol following bolus administration of intravenous contrast. CONTRAST:  12mL ISOVUE-370 IOPAMIDOL (ISOVUE-370) INJECTION 76% COMPARISON:  None. FINDINGS: Lower chest: Dependent atelectasis noted in the lung bases. Dense calcification evident in the mitral annulus. Hepatobiliary: No focal abnormality within the liver parenchyma. There is no evidence for gallstones, gallbladder wall thickening, or pericholecystic fluid. No intrahepatic or extrahepatic biliary dilation. Pancreas: Edema/inflammation is identified in the anterior pararenal space of the retroperitoneal tissues. This surrounds the pancreas diffusely. Pancreas enhances throughout. No dilatation of  the main pancreatic duct. Spleen: No splenomegaly. No focal mass lesion. Adrenals/Urinary Tract: No adrenal nodule or mass. 2 cm cyst identified in the interpolar left kidney. Another 1.5 cm probable cyst is identified towards the lower pole left kidney. Additional tiny hypoattenuating lesions in each kidney are too small to characterize but are likely cysts. No evidence for hydroureter. The urinary bladder appears normal for the degree of distention. Stomach/Bowel: Stomach is nondistended. No gastric wall thickening. No evidence of outlet obstruction. There is edema around the duodenum with a focal area of proximal duodenal wall thickening (axial image 36/series 2 and visible on coronal image 37 / series 5). Duodenum otherwise unremarkable. No small bowel wall thickening. No small bowel dilatation. The terminal ileum is normal. The appendix is normal. Left colon is decompressed with minimal diverticular change. Vascular/Lymphatic: There is abdominal aortic atherosclerosis without aneurysm. There is no gastrohepatic or hepatoduodenal ligament lymphadenopathy. No intraperitoneal or retroperitoneal lymphadenopathy. 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 No pelvic sidewall lymphadenopathy. Reproductive: Uterus surgically absent.  There is no adnexal mass. Other: No intraperitoneal free fluid. Musculoskeletal: Bone windows reveal no worrisome lytic or sclerotic osseous lesions. IMPRESSION: 1. Diffuse edema/inflammation in the retroperitoneal space of the abdomen. This surrounds the pancreas and although the pancreas does not appear substantially edematous, imaging features are felt to be most likely related to pancreatitis given the distribution. Pancreatic parenchyma enhances throughout and there is no dilatation of the main pancreatic duct. 2. The edema/inflammation tracks up medially along the first and proximal second portions of the duodenum. There is a focal area of wall thickening along the  lateral wall of the proximal duodenum which may represent a fold although ulcer or other duodenal lesion cannot be excluded. This does not appear to represent the epicenter of the edema/inflammation. Follow-up upper endoscopy may be warranted to further evaluate. 3. No evidence for gallstones by CT imaging. No intra or extrahepatic biliary duct dilatation. 4.  Aortic Atherosclerois (ICD10-170.0) Electronically Signed   By: Misty Stanley M.D.   On: 03/18/2018 15:43   US Abdomen Limited Ruq  Result Date: 03/20/2018 CLINICAL DATA:  Pancreatitis.  Assess gallbladder. EXAM: ULTRASOUND ABDOMEN LIMITED RIGHT UPPER QUADRANT COMPARISON:  None. FINDINGS: Gallbladder: No gallstones or wall thickening visualized. No sonographic Murphy sign noted by sonographer. Common bile duct: Diameter: 4.7 mm Liver: No focal lesion identified. There is diffuse increased echotexture of the liver. Portal vein is patent on color Doppler imaging with normal direction of blood flow towards the liver. Trace ascites is identified. IMPRESSION: Normal gallbladder. Fatty infiltration of liver. Electronically Signed   By: Abelardo Diesel M.D.   On: 03/20/2018 12:39    Scheduled Meds: . enoxaparin (LOVENOX) injection  40 mg Subcutaneous Q24H  . insulin aspart  0-9 Units Subcutaneous TID WC   Continuous Infusions: . famotidine (PEPCID) IV Stopped (03/20/18 0955)  . lactated ringers 50 mL/hr at 03/20/18 0935    Assessment/Plan:  1. Acute pancreatitis likely related  to [place].  I ordered an ultrasound of the abdomen this morning to make sure it is not the gallbladder.  Gallbladder looks normal.  Advance to full liquid diet.  Monitor another day and see how she does tomorrow. 2. Acute duodenitis likely related to the pancreatitis on Protonix 3. Leukocytosis secondary to pancreatitis 4. Type 2 diabetes mellitus on sliding scale 5. Hypertension holding oral medications 6. Hyperlipidemia unspecified holding Zocor  Code Status:      Code Status Orders  (From admission, onward)        Start     Ordered   03/18/18 1747  Full code  Continuous     03/18/18 1746    Code Status History    This patient has a current code status but no historical code status.    Advance Directive Documentation     Most Recent Value  Type of Advance Directive  Healthcare Power of Attorney, Living will  Pre-existing out of facility DNR order (yellow form or pink MOST form)  -  "MOST" Form in Place?  -     Disposition Plan: Home once pain-free and tolerating solid food  Consultants:  Gastroenterology  Time spent: 28 minutes  [place]

## 2018-03-20 NOTE — Progress Notes (Signed)
Lucilla Lame, MD [facility]   [address], [place], [place] [address] Phone: [phone] Fax : [phone]   Subjective: This patient was admitted with pancreatitis and had a significantly elevated lipase of 1182 that came down to 63 today.  The patient had a CT scan consistent with pancreatitis.  The patient continues to have abdominal pain and requires pain medication.  Since the patient was having continued abdominal pain the patient was set up for a ultrasound for today.  The patient's white cell count had gone down from 19.4 yesterday to 17.5 today.   Objective: Vital signs in last 24 hours: Vitals:   03/19/18 0545 03/19/18 1247 03/19/18 2130 03/20/18 0510  BP: (!) 146/56 (!) 162/63 (!) 142/62 (!) 136/51  Pulse: 79 80 92 73  Resp: 18 10 20 20   Temp: 98.9 F (37.2 C) 97.9 F (36.6 C) 98.7 F (37.1 C) 98.8 F (37.1 C)  TempSrc: Oral Oral Oral Oral  SpO2: 95% 96% 93% 94%  Weight:      Height:       Weight change:   Intake/Output Summary (Last 24 hours) at 03/20/2018 1237 Last data filed at 03/20/2018 1036 Gross per 24 hour  Intake 2198.08 ml  Output 1700 ml  Net 498.08 ml     Exam: Heart:: Regular rate and rhythm, S1S2 present or without murmur or extra heart sounds Lungs: normal and clear to auscultation and percussion Abdomen: Diffuse abdominal tenderness more pronounced in the epigastric area   Lab Results: @LABTEST2 @ Micro Results: No results found for this or any previous visit (from the past 240 hour(s)). Studies/Results: Ct Abdomen Pelvis W Contrast  Result Date: 03/18/2018 CLINICAL DATA:  Abdominal pain with nausea and vomiting. EXAM: CT ABDOMEN AND PELVIS WITH CONTRAST TECHNIQUE: Multidetector CT imaging of the abdomen and pelvis was performed using the standard protocol following bolus administration of intravenous contrast. CONTRAST:  67mL ISOVUE-370 IOPAMIDOL (ISOVUE-370) INJECTION 76% COMPARISON:  None. FINDINGS: Lower chest: Dependent atelectasis  noted in the lung bases. Dense calcification evident in the mitral annulus. Hepatobiliary: No focal abnormality within the liver parenchyma. There is no evidence for gallstones, gallbladder wall thickening, or pericholecystic fluid. No intrahepatic or extrahepatic biliary dilation. Pancreas: Edema/inflammation is identified in the anterior pararenal space of the retroperitoneal tissues. This surrounds the pancreas diffusely. Pancreas enhances throughout. No dilatation of the main pancreatic duct. Spleen: No splenomegaly. No focal mass lesion. Adrenals/Urinary Tract: No adrenal nodule or mass. 2 cm cyst identified in the interpolar left kidney. Another 1.5 cm probable cyst is identified towards the lower pole left kidney. Additional tiny hypoattenuating lesions in each kidney are too small to characterize but are likely cysts. No evidence for hydroureter. The urinary bladder appears normal for the degree of distention. Stomach/Bowel: Stomach is nondistended. No gastric wall thickening. No evidence of outlet obstruction. There is edema around the duodenum with a focal area of proximal duodenal wall thickening (axial image 36/series 2 and visible on coronal image 37 / series 5). Duodenum otherwise unremarkable. No small bowel wall thickening. No small bowel dilatation. The terminal ileum is normal. The appendix is normal. Left colon is decompressed with minimal diverticular change. Vascular/Lymphatic: There is abdominal aortic atherosclerosis without aneurysm. There is no gastrohepatic or hepatoduodenal ligament lymphadenopathy. No intraperitoneal or retroperitoneal lymphadenopathy. 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 No pelvic sidewall lymphadenopathy. Reproductive: Uterus surgically absent.  There is no adnexal mass. Other: No intraperitoneal free fluid. Musculoskeletal: Bone windows reveal no worrisome lytic or  sclerotic osseous lesions. IMPRESSION: 1. Diffuse edema/inflammation in the  retroperitoneal space of the abdomen. This surrounds the pancreas and although the pancreas does not appear substantially edematous, imaging features are felt to be most likely related to pancreatitis given the distribution. Pancreatic parenchyma enhances throughout and there is no dilatation of the main pancreatic duct. 2. The edema/inflammation tracks up medially along the first and proximal second portions of the duodenum. There is a focal area of wall thickening along the lateral wall of the proximal duodenum which may represent a fold although ulcer or other duodenal lesion cannot be excluded. This does not appear to represent the epicenter of the edema/inflammation. Follow-up upper endoscopy may be warranted to further evaluate. 3. No evidence for gallstones by CT imaging. No intra or extrahepatic biliary duct dilatation. 4.  Aortic Atherosclerois (ICD10-170.0) Electronically Signed   By: Misty Stanley M.D.   On: 03/18/2018 15:43   Medications: I have reviewed the patient's current medications. Scheduled Meds: . enoxaparin (LOVENOX) injection  40 mg Subcutaneous Q24H  . insulin aspart  0-9 Units Subcutaneous TID WC   Continuous Infusions: . famotidine (PEPCID) IV Stopped (03/20/18 0955)  . lactated ringers 50 mL/hr at 03/20/18 0935   PRN Meds:.acetaminophen **OR** acetaminophen, HYDROmorphone (DILAUDID) injection, ondansetron **OR** ondansetron (ZOFRAN) IV, promethazine   Assessment: Active Problems:   Acute pancreatitis    Plan: This patient has acute pancreatitis that has improved biochemically but continues to cause the patient to have abdominal discomfort.  The patient has been requiring pain medication.  The patient has a ultrasound of the abdomen pending for today.  The results are not back.  There is nothing further to do from a GI point of view at this point.  The ultrasound showed the patient to have a normal gallbladder with some fatty liver.  I will sign off.  Please call if  any further GI concerns or questions.  We would like to thank you for the opportunity to participate in the care of Molly [name].    LOS: 2 days   Lucilla Lame 03/20/2018, 12:37 PM

## 2018-03-21 LAB — GLUCOSE, CAPILLARY
GLUCOSE-CAPILLARY: 112 mg/dL — AB (ref 65–99)
GLUCOSE-CAPILLARY: 172 mg/dL — AB (ref 65–99)
GLUCOSE-CAPILLARY: 108 mg/dL — AB (ref 65–99)
GLUCOSE-CAPILLARY: 99 mg/dL (ref 65–99)

## 2018-03-21 MED ORDER — METOPROLOL TARTRATE 25 MG PO TABS
25.0000 mg | ORAL_TABLET | Freq: Two times a day (BID) | ORAL | Status: DC
  Administered 2018-03-21 – 2018-03-22 (×3): 25 mg via ORAL
  Filled 2018-03-21 (×3): qty 1

## 2018-03-21 MED ORDER — POLYETHYLENE GLYCOL 3350 17 G PO PACK
17.0000 g | PACK | Freq: Every day | ORAL | Status: DC
  Administered 2018-03-21 – 2018-03-22 (×2): 17 g via ORAL
  Filled 2018-03-21 (×2): qty 1

## 2018-03-21 NOTE — Progress Notes (Signed)
Patient ID: Molly [name], female   DOB: Jul 21, 1941, 77 y.o.   MRN: [id number]  [facility] PROGRESS NOTE  Molly [name] RCV:[id number] DOB: Jun 23, 1941 DOA: 03/18/2018 PCP: Molly Crouch, MD  HPI/Subjective: Patient feeling abdominal pain 6 out of 10 in intensity.  Some constipation.  She is feeling better than yesterday but still not well enough at this point.  She is interested in trying solid food.  Objective: Vitals:   03/21/18 0438 03/21/18 1348  BP: (!) 150/61 (!) 156/63  Pulse: 80 80  Resp: 16 16  Temp: 97.9 F (36.6 C) 98.4 F (36.9 C)  SpO2: 94% 94%    Filed Weights   03/18/18 1209  Weight: 72.6 kg (160 lb)    ROS: Review of Systems  Constitutional: Negative for chills and fever.  Eyes: Negative for blurred vision.  Respiratory: Negative for cough and shortness of breath.   Cardiovascular: Negative for chest pain.  Gastrointestinal: Positive for abdominal pain and constipation. Negative for diarrhea, nausea and vomiting.  Genitourinary: Negative for dysuria.  Musculoskeletal: Negative for joint pain.  Neurological: Negative for dizziness and headaches.   Exam: Physical Exam  HENT:  Nose: No mucosal edema.  Mouth/Throat: No oropharyngeal exudate or posterior oropharyngeal edema.  Eyes: Pupils are equal, round, and reactive to light. Conjunctivae, EOM and lids are normal.  Neck: No JVD present. Carotid bruit is not present. No edema present. No thyroid mass and no thyromegaly present.  Cardiovascular: S1 normal and S2 normal. Exam reveals no gallop.  No murmur heard. Pulses:      Dorsalis pedis pulses are 2+ on the right side, and 2+ on the left side.  Respiratory: No respiratory distress. She has no wheezes. She has no rhonchi. She has no rales.  GI: Soft. Bowel sounds are normal. There is tenderness in the right upper quadrant and epigastric area.  Musculoskeletal:       Right ankle: She exhibits no swelling.       Left ankle: She exhibits no swelling.   Lymphadenopathy:    She has no cervical adenopathy.  Neurological: She is alert. No cranial nerve deficit.  Skin: Skin is warm. No rash noted. Nails show no clubbing.  Psychiatric: She has a normal mood and affect.      Data Reviewed: Basic Metabolic Panel: Recent Labs  Lab 03/18/18 1210 03/19/18 0352 03/20/18 0553  NA 141 145 138  K 4.2 3.9 3.2*  CL 104 110 108  CO2 24 22 20*  GLUCOSE 281* 136* 106*  BUN 16 15 15   CREATININE 0.54 0.43* 0.45  CALCIUM 9.4 8.0* 7.4*   Liver Function Tests: Recent Labs  Lab 03/18/18 1210 03/19/18 0352  AST 22 20  ALT 15 13*  ALKPHOS 36* 34*  BILITOT 0.8 0.9  PROT 7.7 7.1  ALBUMIN 4.6 4.0   Recent Labs  Lab 03/18/18 1210 03/20/18 0553  LIPASE 1,182* 63*   CBC: Recent Labs  Lab 03/18/18 1210 03/19/18 0352 03/20/18 0553  WBC 18.2* 19.4* 17.5*  HGB 14.3 13.7 12.1  HCT 44.1 42.9 37.6  MCV 80.6 81.0 80.7  PLT 303 275 228    CBG: Recent Labs  Lab 03/20/18 1159 03/20/18 1713 03/20/18 2107 03/21/18 0756 03/21/18 1140  GLUCAP 72 103* 122* 112* 172*     Studies: US Abdomen Limited Ruq  Result Date: 03/20/2018 CLINICAL DATA:  Pancreatitis.  Assess gallbladder. EXAM: ULTRASOUND ABDOMEN LIMITED RIGHT UPPER QUADRANT COMPARISON:  None. FINDINGS: Gallbladder: No gallstones or wall thickening visualized.  No sonographic Murphy sign noted by sonographer. Common bile duct: Diameter: 4.7 mm Liver: No focal lesion identified. There is diffuse increased echotexture of the liver. Portal vein is patent on color Doppler imaging with normal direction of blood flow towards the liver. Trace ascites is identified. IMPRESSION: Normal gallbladder. Fatty infiltration of liver. Electronically Signed   By: Abelardo Diesel M.D.   On: 03/20/2018 12:39    Scheduled Meds: . enoxaparin (LOVENOX) injection  40 mg Subcutaneous Q24H  . insulin aspart  0-9 Units Subcutaneous TID WC  . polyethylene glycol  17 g Oral Daily   Continuous Infusions: .  famotidine (PEPCID) IV Stopped (03/21/18 1000)  . lactated ringers 30 mL/hr at 03/21/18 0900    Assessment/Plan:  1. Acute pancreatitis likely related to [place].  Gallbladder looks normal on CT scan and ultrasound.  Advance to low-fat today.  Monitor another day and see how she does tomorrow.  Recheck labs tomorrow. 2. Acute duodenitis likely related to the pancreatitis on Protonix 3. Leukocytosis secondary to pancreatitis 4. Type 2 diabetes mellitus on sliding scale 5. Hypertension.  Can restart metoprolol 6. Hyperlipidemia unspecified holding Zocor  Code Status:     Code Status Orders  (From admission, onward)        Start     Ordered   03/18/18 1747  Full code  Continuous     03/18/18 1746    Code Status History    This patient has a current code status but no historical code status.    Advance Directive Documentation     Most Recent Value  Type of Advance Directive  Healthcare Power of Attorney, Living will  Pre-existing out of facility DNR order (yellow form or pink MOST form)  -  "MOST" Form in Place?  -     Disposition Plan: Home once pain-free and tolerating solid food  Consultants:  Gastroenterology  Time spent: 27 minutes.  Spoke with husband at the bedside  [employer]

## 2018-03-22 LAB — COMPREHENSIVE METABOLIC PANEL
ALT: 10 U/L — AB (ref 14–54)
AST: 13 U/L — ABNORMAL LOW (ref 15–41)
Albumin: 2.7 g/dL — ABNORMAL LOW (ref 3.5–5.0)
Alkaline Phosphatase: 48 U/L (ref 38–126)
Anion gap: 11 (ref 5–15)
BUN: 10 mg/dL (ref 6–20)
CHLORIDE: 106 mmol/L (ref 101–111)
CO2: 18 mmol/L — ABNORMAL LOW (ref 22–32)
Calcium: 7.5 mg/dL — ABNORMAL LOW (ref 8.9–10.3)
Glucose, Bld: 112 mg/dL — ABNORMAL HIGH (ref 65–99)
POTASSIUM: 3.1 mmol/L — AB (ref 3.5–5.1)
SODIUM: 135 mmol/L (ref 135–145)
Total Bilirubin: 1.2 mg/dL (ref 0.3–1.2)
Total Protein: 5.7 g/dL — ABNORMAL LOW (ref 6.5–8.1)

## 2018-03-22 LAB — GLUCOSE, CAPILLARY
GLUCOSE-CAPILLARY: 96 mg/dL (ref 65–99)
GLUCOSE-CAPILLARY: 162 mg/dL — AB (ref 65–99)

## 2018-03-22 LAB — LIPASE, BLOOD: LIPASE: 21 U/L (ref 11–51)

## 2018-03-22 MED ORDER — POLYETHYLENE GLYCOL 3350 17 G PO PACK: 17.0000 g | PACK | Freq: Every day | ORAL | 0 refills | Status: DC | PRN

## 2018-03-22 MED ORDER — ACETAMINOPHEN 325 MG PO TABS: 650.0000 mg | ORAL_TABLET | Freq: Four times a day (QID) | ORAL | Status: DC | PRN

## 2018-03-22 MED ORDER — HYDROMORPHONE HCL 2 MG PO TABS: 2.0000 mg | ORAL_TABLET | Freq: Four times a day (QID) | ORAL | 0 refills | Status: AC | PRN

## 2018-03-22 MED ORDER — POTASSIUM CHLORIDE CRYS ER 20 MEQ PO TBCR
40.0000 meq | EXTENDED_RELEASE_TABLET | Freq: Once | ORAL | Status: AC
  Administered 2018-03-22: 40 meq via ORAL
  Filled 2018-03-22: qty 2

## 2018-03-22 MED ORDER — METOPROLOL TARTRATE 25 MG PO TABS: 25.0000 mg | ORAL_TABLET | Freq: Two times a day (BID) | ORAL | 0 refills | Status: DC

## 2018-03-22 NOTE — Care Management Note (Signed)
Case Management Note  Patient Details  Name: Molly [name] MRN: [id number] Date of Birth: [birth year]   Patient to discharge home today.  PT has assessed patient and recommended home health. Patient agreeable to services and did not have a preference of agency.  Referral was made to [facility] with [facility].  Rw was delivered to room, however patient declined as she did not want to pay co pay   Subjective/Objective:                    Action/Plan:   Expected Discharge Date:  03/22/18               Expected Discharge Plan:  [place]  In-House Referral:     Discharge planning Services  CM Consult  Post Acute Care Choice:  Home Health Choice offered to:  Patient  DME Arranged:    DME Agency:     HH Arranged:  PT [place]:  [place]  Status of Service:  Completed, signed off  If discussed at [place] of Stay Meetings, dates discussed:    Additional Comments:  Beverly Sessions, RN 03/22/2018, 11:30 AM

## 2018-03-22 NOTE — Discharge Instructions (Signed)
Acute Pancreatitis Acute pancreatitis is a condition in which the pancreas suddenly gets irritated and swollen (has inflammation). The pancreas is a large gland behind the stomach. It makes enzymes that help to digest food. The pancreas also makes hormones that help to control your blood sugar. Acute pancreatitis happens when the enzymes attack the pancreas and damage it. Most attacks last a couple of days and can cause serious problems. Follow these instructions at home: Eating and drinking  Follow instructions from your doctor about diet. You may need to: ? Avoid alcohol. ? Limit how much fat is in your diet.  Eat small meals often. Avoid eating big meals.  Drink enough fluid to keep your pee (urine) clear or pale yellow.  Do not drink alcohol if it caused your condition. General instructions  Take over-the-counter and prescription medicines only as told by your doctor.  Do not use any tobacco products. These include cigarettes, chewing tobacco, and e-cigarettes. If you need help quitting, ask your doctor.  Get plenty of rest.  If directed, check your blood sugar at home as told by your doctor.  Keep all follow-up visits as told by your doctor. This is important. Contact a doctor if:  You do not get better as quickly as expected.  You have new symptoms.  Your symptoms get worse.  You have lasting pain or weakness.  You continue to feel sick to your stomach (nauseous).  You get better and then you have another pain attack.  You have a fever. Get help right away if:  You cannot eat or keep fluids down.  Your pain becomes very bad.  Your skin or the white part of your eyes turns yellow (jaundice).  You throw up (vomit).  You feel dizzy or you pass out (faint).  Your blood sugar is high (over 300 mg/dL). This information is not intended to replace advice given to you by your health care provider. Make sure you discuss any questions you have with your health care  provider. Document Released: 04/14/2008 Document Revised: 04/03/2016 Document Reviewed: 07/31/2015 Elsevier Interactive Patient Education  2018 [employer]  

## 2018-03-22 NOTE — Discharge Summary (Signed)
[facility] NAME: Molly [name]    MR#:  [id number]  DATE OF BIRTH:  17-May-1941  DATE OF ADMISSION:  03/18/2018 ADMITTING PHYSICIAN: Saundra Shelling, MD  DATE OF DISCHARGE: 03/22/2018  1:06 PM  PRIMARY CARE PHYSICIAN: Idelle Crouch, MD    ADMISSION DIAGNOSIS:  Acute pancreatitis without infection or necrosis, unspecified pancreatitis type [K85.90]  DISCHARGE DIAGNOSIS:  Active Problems:   Acute pancreatitis   SECONDARY DIAGNOSIS:   Past Medical History:  Diagnosis Date  . Arthritis   . Cancer ([place])    skin  . Diabetes mellitus, type 2 ([place])   . FH: colonic polyps    hx of-adenomatous  . GERD (gastroesophageal reflux disease)   . Heart murmur    dx'd in 1960s. followed by PCP  . Hyperlipidemia   . Hypertension   . IBS (irritable bowel syndrome)   . Osteoporosis     HOSPITAL COURSE:   1.  Acute pancreatitis likely related to Jardiance.  Gallbladder looks normal on CT scan and ultrasound.  The patient does not drink alcohol.  Patient's triglycerides were normal.  The patient's lipase when she came in was 1182.  This has normalized to 21 upon discharge home.  She was tolerating solid food.  She was complaining about more with her back pain upon discharge rather than any abdominal pain.  She did have quite a bit of abdominal pain when she came into the hospital. 2.  Acute duodenitis could be related to the pancreatitis.  Patient was given Protonix while here in the hospital and can go back on her omeprazole as outpatient 3.  Leukocytosis likely secondary to pancreatitis.  Recheck CBC as outpatient 4.  Type 2 diabetes mellitus on sliding scale while here in the hospital.  Jardiance was discontinued secondary to pancreatitis.  Since all of her sugars have been good here and her last hemoglobin A1c was 7.3.  I am nervous about prescribing glipizide at this time.  Can go back on her metformin. 5.  Essential hypertension.  All of her  blood pressure medications were held initially.  Can restart metoprolol at the lower dose.  Recheck blood pressure as outpatient and consider restarting medications at that time. 6.   hyperlipidemia unspecified on Zocor as outpatient 7.  Physical therapy recommended home with home health and rolling walker.  DISCHARGE CONDITIONS:   Satisfactory  CONSULTS OBTAINED:  None  DRUG ALLERGIES:   Allergies  Allergen Reactions  . Alendronate Nausea Only  . Codeine Nausea Only  . Lansoprazole     REACTION: hurts stomach  . Sulfonamide Derivatives     Childhood reaction (doesn't remember)  . Adhesive [Tape] Rash    Blisters if on too long    DISCHARGE MEDICATIONS:   Allergies as of 03/22/2018      Reactions   Alendronate Nausea Only   Codeine Nausea Only   Lansoprazole    REACTION: hurts stomach   Sulfonamide Derivatives    Childhood reaction (doesn't remember)   Adhesive [tape] Rash   Blisters if on too long      Medication List    STOP taking these medications   aspirin 81 MG tablet   FISH OIL CONCENTRATE 1000 MG Caps   glipiZIDE 10 MG 24 hr tablet Commonly known as:  GLUCOTROL XL   JARDIANCE 25 MG Tabs tablet Generic drug:  empagliflozin   lisinopril 10 MG tablet Commonly known as:  PRINIVIL,ZESTRIL   VITAMIN K PO  TAKE these medications   ACCU-CHEK COMPACT STRIPS test strip Generic drug:  glucose blood 1 each by Other route as directed. Use as instructed   accu-chek softclix lancets 1 each by Other route as needed. Use as instructed   acetaminophen 325 MG tablet Commonly known as:  TYLENOL Take 2 tablets (650 mg total) by mouth every 6 (six) hours as needed for mild pain or moderate pain (or Fever >/= 101).   Biotin 2500 MCG Caps Take by mouth 2 (two) times daily.   CALCIUM PO Take 750 mg by mouth 2 (two) times daily.   HYDROmorphone 2 MG tablet Commonly known as:  DILAUDID Take 1 tablet (2 mg total) by mouth every 6 (six) hours as needed for  up to 3 days for severe pain.   metFORMIN 500 MG 24 hr tablet Commonly known as:  GLUCOPHAGE-XR Take 1,000 mg by mouth 2 (two) times daily. What changed:  Another medication with the same name was removed. Continue taking this medication, and follow the directions you see here.   metoprolol tartrate 25 MG tablet Commonly known as:  LOPRESSOR Take 1 tablet (25 mg total) by mouth 2 (two) times daily. What changed:    medication strength  how much to take   MULTIVITAL tablet Take 1 tablet by mouth daily.   omeprazole 20 MG capsule Commonly known as:  PRILOSEC TAKE ONE CAPSULE BY MOUTH EVERY DAY   polyethylene glycol packet Commonly known as:  MIRALAX / GLYCOLAX Take 17 g by mouth daily as needed.   simvastatin 10 MG tablet Commonly known as:  ZOCOR Take 1 tablet (10 mg total) by mouth at bedtime.   VITAMIN D PO Take 500 Units by mouth 2 (two) times daily.        DISCHARGE INSTRUCTIONS:   Follow-up PMD 6 days  If you experience worsening of your admission symptoms, develop shortness of breath, life threatening emergency, suicidal or homicidal thoughts you must seek medical attention immediately by calling 911 or calling your MD immediately  if symptoms less severe.  You Must read complete instructions/literature along with all the possible adverse reactions/side effects for all the Medicines you take and that have been prescribed to you. Take any new Medicines after you have completely understood and accept all the possible adverse reactions/side effects.   Please note  You were cared for by a hospitalist during your hospital stay. If you have any questions about your discharge medications or the care you received while you were in the hospital after you are discharged, you can call the unit and asked to speak with the hospitalist on call if the hospitalist that took care of you is not available. Once you are discharged, your primary care physician will handle any further  medical issues. Please note that NO REFILLS for any discharge medications will be authorized once you are discharged, as it is imperative that you return to your primary care physician (or establish a relationship with a primary care physician if you do not have one) for your aftercare needs so that they can reassess your need for medications and monitor your lab values.    Today   CHIEF COMPLAINT:   Chief Complaint  Patient presents with  . Abdominal Pain    HISTORY OF PRESENT ILLNESS:  Molly [name]  is a 77 y.o. female  came in with abdominal pain and found to have pancreatitis   VITAL SIGNS:  Blood pressure (!) 143/56, pulse 84, temperature 98.4 F (36.9 C),  temperature source Oral, resp. rate 16, height 5\' 6"  (1.676 m), weight 72.6 kg (160 lb), SpO2 95 %.    PHYSICAL EXAMINATION:  GENERAL:  77 y.o.-year-old patient lying in the bed with no acute distress.  EYES: Pupils equal, round, reactive to light and accommodation. No scleral icterus. Extraocular muscles intact.  HEENT: Head atraumatic, normocephalic. Oropharynx and nasopharynx clear.  NECK:  Supple, no jugular venous distention. No thyroid enlargement, no tenderness.  LUNGS: Normal breath sounds bilaterally, no wheezing, rales,rhonchi or crepitation. No use of accessory muscles of respiration.  CARDIOVASCULAR: S1, S2 normal. No murmurs, rubs, or gallops.  ABDOMEN: Soft, non-tender, non-distended. Bowel sounds present. No organomegaly or mass.  EXTREMITIES: No pedal edema, cyanosis, or clubbing.  NEUROLOGIC: Cranial nerves II through XII are intact. Muscle strength 5/5 in all extremities. Sensation intact. Gait not checked.  PSYCHIATRIC: The patient is alert and oriented x 3.  SKIN: No obvious rash, lesion, or ulcer.   DATA REVIEW:   CBC Recent Labs  Lab 03/20/18 0553  WBC 17.5*  HGB 12.1  HCT 37.6  PLT 228    Chemistries  Recent Labs  Lab 03/22/18 0503  NA 135  K 3.1*  CL 106  CO2 18*  GLUCOSE 112*  BUN  10  CREATININE <0.30*  CALCIUM 7.5*  AST 13*  ALT 10*  ALKPHOS 48  BILITOT 1.2      Management plans discussed with the patient, family and they are in agreement.  CODE STATUS:  Code Status History    Date Active Date Inactive Code Status Order ID Comments User Context   03/18/2018 1746 03/22/2018 1611 Full Code [id number]  Saundra Shelling, MD Inpatient    Advance Directive Documentation     Most Recent Value  Type of Advance Directive  Healthcare Power of [place], Living will  Pre-existing out of facility DNR order (yellow form or pink MOST form)  -  "MOST" Form in Place?  -      TOTAL TIME TAKING CARE OF THIS PATIENT: 35 minutes.    Loletha Grayer M.D on 03/22/2018 at 4:45 PM  Between 7am to 6pm - Pager - [phone]  After 6pm go to [email or web address] - password [email or web address] [facility] Office  [phone]  CC: Primary care physician; Idelle Crouch, MD

## 2018-03-22 NOTE — Progress Notes (Signed)
Discharge instructions and medication details reviewed with patient and husband. All questions answered, patient verbalized understanding. Printed prescriptions and printed AVS given to patient. IV removed. Pt escorted out via wheelchair.

## 2018-03-22 NOTE — Evaluation (Signed)
Physical Therapy Evaluation Patient Details Name: Molly [name] MRN: [id number] DOB: 1941/01/02 Today's Date: 03/22/2018   History of Present Illness  Pt is a76 y.o.femalewith a known history of skin cancer, diabetes mellitus type 2, familial adenomatous polyposis of colon, GERD, hyperlipidemia, osteoporosis, irritable bowel syndrome presented to the emergency room with abdominal pain.Pain was located in the epigastrium as well as extendedlike a band across the abdomen.Patient was evaluated in the emergency room her lipase level was elevated.She was worked up with CT abdomen in the emergency room which showed inflammation around the pancreas and also questionable duodenal ulcer with edema.Hospitalist service was consulted for further care.  Assessment includes: acute pancreatitis, acute duodenitis, leukocytosis, DM II, and HTN.     Clinical Impression  Pt presents with deficits in strength, gait, balance, and activity tolerance.  Of note pt's Ka was last measured at 3.1 with nursing stating pt had received Ka replacement since that reading was taken and OK to see pt.  Pt was able to amb 1 x 80' with a RW and 1 x 120' without an AD with noted decrease in stability when walking without an AD.  With a RW pt was steady without any signs of instability but when ambulating without the walker the pt drifted moderately from L to R with significant variations in cadence as well as frequent reaching out to the desk or rail for support.  Pt's SpO2 and HR were WNL during the session with no adverse symptoms reported by the pt.  Pt will benefit from HHPT services upon discharge to safely address the above deficits for decreased caregiver assistance and eventual return to PLOF.      Follow Up Recommendations Home health PT;Supervision for mobility/OOB    Equipment Recommendations  Rolling walker with 5" wheels    Recommendations for Other Services       Precautions / Restrictions  Precautions Precautions: Fall Restrictions Weight Bearing Restrictions: No      Mobility  Bed Mobility Overal bed mobility: Independent                Transfers Overall transfer level: Independent Equipment used: None             General transfer comment: Good eccentric and concentric control and good stability upon initial stand  Ambulation/Gait Ambulation/Gait assistance: Min guard Ambulation Distance (Feet): 120 Feet Assistive device: None;Rolling walker (2 wheeled) Gait Pattern/deviations: Step-through pattern;Decreased step length - right;Decreased step length - left;Drifts right/left Gait velocity: Decreased   General Gait Details: Pt ambulated 1 x 80' with RW and 1 x 120' without AD with good stability with the RW but with min to mod instability without AD.  Stairs            Wheelchair Mobility    Modified Rankin (Stroke Patients Only)       Balance Overall balance assessment: Needs assistance   Sitting balance-Leahy Scale: Normal     Standing balance support: No upper extremity supported;During functional activity Standing balance-Leahy Scale: Poor Standing balance comment: Pt with min to mod instability during amb without AD that improved significantly with a RW                             Pertinent Vitals/Pain Pain Assessment: 0-10 Pain Score: 2  Pain Location: Mid back Pain Descriptors / Indicators: Sore Pain Intervention(s): Premedicated before session;Monitored during session    Home Living Family/patient expects to be discharged to::  Private residence Living Arrangements: Spouse/significant other Available Help at Discharge: Family;Available 24 hours/day Type of Home: House Home Access: Stairs to enter Entrance Stairs-Rails: Right;Left;Can reach both Entrance Stairs-Number of Steps: 2 Home Layout: One level Home Equipment: Cane - single point      Prior Function Level of Independence: Independent          Comments: Ind amb community distances without AD, one fall in the last year slipping on wet sandal, Ind with ADLs     Hand Dominance   Dominant Hand: Right    Extremity/Trunk Assessment   Upper Extremity Assessment Upper Extremity Assessment: Overall WFL for tasks assessed    Lower Extremity Assessment Lower Extremity Assessment: Generalized weakness       Communication   Communication: No difficulties  Cognition Arousal/Alertness: Awake/alert Behavior During Therapy: WFL for tasks assessed/performed Overall Cognitive Status: Within Functional Limits for tasks assessed                                        General Comments      Exercises Total Joint Exercises Ankle Circles/Pumps: Strengthening;Both;10 reps Quad Sets: Strengthening;Both;10 reps Gluteal Sets: Strengthening;Both;10 reps Hip ABduction/ADduction: AROM;Both;5 reps Straight Leg Raises: AROM;Both;5 reps Long Arc Quad: AROM;Both;10 reps Knee Flexion: AROM;Both;10 reps Marching in Standing: AROM;Both;10 reps;Seated;Standing   Assessment/Plan    PT Assessment Patient needs continued PT services  PT Problem List Decreased strength;Decreased activity tolerance;Decreased balance;Decreased knowledge of use of DME       PT Treatment Interventions DME instruction;Gait training;Stair training;Balance training;Therapeutic exercise;Therapeutic activities;Patient/family education    PT Goals (Current goals can be found in the Care Plan section)  Acute Rehab PT Goals Patient Stated Goal: To get back to swimming for exercise again PT Goal Formulation: With patient Time For Goal Achievement: 04/04/18 Potential to Achieve Goals: Good    Frequency Min 2X/week   Barriers to discharge        Co-evaluation               AM-PAC PT "6 Clicks" Daily Activity  Outcome Measure Difficulty turning over in bed (including adjusting bedclothes, sheets and blankets)?: None Difficulty moving from  lying on back to sitting on the side of the bed? : None Difficulty sitting down on and standing up from a chair with arms (e.g., wheelchair, bedside commode, etc,.)?: None Help needed moving to and from a bed to chair (including a wheelchair)?: None Help needed walking in hospital room?: A Little Help needed climbing 3-5 steps with a railing? : A Little 6 Click Score: 22    End of Session Equipment Utilized During Treatment: Gait belt Activity Tolerance: Patient tolerated treatment well Patient left: in bed;with bed alarm set;with family/visitor present;with call bell/phone within reach Nurse Communication: Mobility status PT Visit Diagnosis: Unsteadiness on feet (R26.81);Muscle weakness (generalized) (M62.81);Difficulty in walking, not elsewhere classified (R26.2)    Time: 1610-9604 PT Time Calculation (min) (ACUTE ONLY): 31 min   Charges:   PT Evaluation $PT Eval Low Complexity: 1 Low PT Treatments $Therapeutic Exercise: 8-22 mins   PT G Codes:        DRoyetta Asal PT, DPT 03/22/18, 11:23 AM

## 2018-03-22 NOTE — Care Management Important Message (Signed)
Copy of signed IM left in patient's room.    

## 2018-03-25 DIAGNOSIS — E1129 Type 2 diabetes mellitus with other diabetic kidney complication: Secondary | ICD-10-CM | POA: Diagnosis not present

## 2018-03-25 DIAGNOSIS — R809 Proteinuria, unspecified: Secondary | ICD-10-CM | POA: Diagnosis not present

## 2018-03-25 DIAGNOSIS — K858 Other acute pancreatitis without necrosis or infection: Secondary | ICD-10-CM | POA: Diagnosis not present

## 2018-03-25 DIAGNOSIS — I10 Essential (primary) hypertension: Secondary | ICD-10-CM | POA: Diagnosis not present

## 2018-03-25 DIAGNOSIS — Z79899 Other long term (current) drug therapy: Secondary | ICD-10-CM | POA: Diagnosis not present

## 2018-03-26 DIAGNOSIS — I10 Essential (primary) hypertension: Secondary | ICD-10-CM | POA: Diagnosis not present

## 2018-03-26 DIAGNOSIS — R69 Illness, unspecified: Secondary | ICD-10-CM | POA: Diagnosis not present

## 2018-03-26 DIAGNOSIS — K2980 Duodenitis without bleeding: Secondary | ICD-10-CM | POA: Diagnosis not present

## 2018-03-26 DIAGNOSIS — R809 Proteinuria, unspecified: Secondary | ICD-10-CM | POA: Diagnosis not present

## 2018-03-26 DIAGNOSIS — K853 Drug induced acute pancreatitis without necrosis or infection: Secondary | ICD-10-CM | POA: Insufficient documentation

## 2018-03-26 DIAGNOSIS — E1129 Type 2 diabetes mellitus with other diabetic kidney complication: Secondary | ICD-10-CM | POA: Diagnosis not present

## 2018-03-26 DIAGNOSIS — K858 Other acute pancreatitis without necrosis or infection: Secondary | ICD-10-CM | POA: Diagnosis not present

## 2018-03-26 DIAGNOSIS — Z79899 Other long term (current) drug therapy: Secondary | ICD-10-CM | POA: Diagnosis not present

## 2018-03-29 ENCOUNTER — Telehealth: Payer: Self-pay

## 2018-03-29 NOTE — Telephone Encounter (Signed)
EMMI Follow-up: Noted on the report that patient had other questions/problems.  I talked with Molly [name] and she does not concerns at this time.

## 2018-03-31 DIAGNOSIS — E113293 Type 2 diabetes mellitus with mild nonproliferative diabetic retinopathy without macular edema, bilateral: Secondary | ICD-10-CM | POA: Diagnosis not present

## 2018-03-31 DIAGNOSIS — Z8719 Personal history of other diseases of the digestive system: Secondary | ICD-10-CM | POA: Diagnosis not present

## 2018-03-31 DIAGNOSIS — R809 Proteinuria, unspecified: Secondary | ICD-10-CM | POA: Diagnosis not present

## 2018-03-31 DIAGNOSIS — M810 Age-related osteoporosis without current pathological fracture: Secondary | ICD-10-CM | POA: Diagnosis not present

## 2018-03-31 DIAGNOSIS — E1165 Type 2 diabetes mellitus with hyperglycemia: Secondary | ICD-10-CM | POA: Diagnosis not present

## 2018-03-31 DIAGNOSIS — Z9289 Personal history of other medical treatment: Secondary | ICD-10-CM | POA: Diagnosis not present

## 2018-03-31 DIAGNOSIS — E1129 Type 2 diabetes mellitus with other diabetic kidney complication: Secondary | ICD-10-CM | POA: Diagnosis not present

## 2018-04-01 DIAGNOSIS — R197 Diarrhea, unspecified: Secondary | ICD-10-CM | POA: Diagnosis not present

## 2018-04-01 DIAGNOSIS — Z8719 Personal history of other diseases of the digestive system: Secondary | ICD-10-CM | POA: Insufficient documentation

## 2018-04-01 DIAGNOSIS — K219 Gastro-esophageal reflux disease without esophagitis: Secondary | ICD-10-CM | POA: Diagnosis not present

## 2018-04-01 DIAGNOSIS — K2980 Duodenitis without bleeding: Secondary | ICD-10-CM | POA: Diagnosis not present

## 2018-04-15 ENCOUNTER — Encounter: Payer: Self-pay | Admitting: *Deleted

## 2018-04-16 ENCOUNTER — Encounter: Payer: Self-pay | Admitting: Anesthesiology

## 2018-04-16 ENCOUNTER — Ambulatory Visit: Payer: Medicare HMO | Admitting: Anesthesiology

## 2018-04-16 ENCOUNTER — Encounter
Admission: RE | Disposition: A | Discharge status: Home / Self Care | Payer: Self-pay | Source: Ambulatory Visit | Attending: Unknown Physician Specialty

## 2018-04-16 ENCOUNTER — Ambulatory Visit
Admission: RE | Admit: 2018-04-16 | Discharge: 2018-04-16 | Disposition: A | Discharge status: Home / Self Care | Payer: Medicare HMO | Source: Ambulatory Visit | Attending: Unknown Physician Specialty | Admitting: Unknown Physician Specialty

## 2018-04-16 DIAGNOSIS — Z87891 Personal history of nicotine dependence: Secondary | ICD-10-CM | POA: Insufficient documentation

## 2018-04-16 DIAGNOSIS — R69 Illness, unspecified: Secondary | ICD-10-CM | POA: Diagnosis not present

## 2018-04-16 DIAGNOSIS — K219 Gastro-esophageal reflux disease without esophagitis: Secondary | ICD-10-CM | POA: Insufficient documentation

## 2018-04-16 DIAGNOSIS — Z85828 Personal history of other malignant neoplasm of skin: Secondary | ICD-10-CM | POA: Diagnosis not present

## 2018-04-16 DIAGNOSIS — Z885 Allergy status to narcotic agent status: Secondary | ICD-10-CM | POA: Diagnosis not present

## 2018-04-16 DIAGNOSIS — K859 Acute pancreatitis without necrosis or infection, unspecified: Secondary | ICD-10-CM | POA: Insufficient documentation

## 2018-04-16 DIAGNOSIS — Z86010 Personal history of colonic polyps: Secondary | ICD-10-CM | POA: Insufficient documentation

## 2018-04-16 DIAGNOSIS — Z79899 Other long term (current) drug therapy: Secondary | ICD-10-CM | POA: Diagnosis not present

## 2018-04-16 DIAGNOSIS — K589 Irritable bowel syndrome without diarrhea: Secondary | ICD-10-CM | POA: Insufficient documentation

## 2018-04-16 DIAGNOSIS — E785 Hyperlipidemia, unspecified: Secondary | ICD-10-CM | POA: Insufficient documentation

## 2018-04-16 DIAGNOSIS — R1013 Epigastric pain: Secondary | ICD-10-CM | POA: Diagnosis not present

## 2018-04-16 DIAGNOSIS — Z794 Long term (current) use of insulin: Secondary | ICD-10-CM | POA: Diagnosis not present

## 2018-04-16 DIAGNOSIS — Z882 Allergy status to sulfonamides status: Secondary | ICD-10-CM | POA: Diagnosis not present

## 2018-04-16 DIAGNOSIS — I10 Essential (primary) hypertension: Secondary | ICD-10-CM | POA: Diagnosis not present

## 2018-04-16 DIAGNOSIS — E119 Type 2 diabetes mellitus without complications: Secondary | ICD-10-CM | POA: Diagnosis not present

## 2018-04-16 DIAGNOSIS — Z888 Allergy status to other drugs, medicaments and biological substances status: Secondary | ICD-10-CM | POA: Diagnosis not present

## 2018-04-16 HISTORY — DX: Cytomegaloviral disease, unspecified: B25.9

## 2018-04-16 HISTORY — DX: Duodenitis without bleeding: K29.80

## 2018-04-16 HISTORY — PX: ESOPHAGOGASTRODUODENOSCOPY (EGD) WITH PROPOFOL: SHX5813

## 2018-04-16 HISTORY — DX: Unspecified hemorrhoids: K64.9

## 2018-04-16 HISTORY — DX: Age-related osteoporosis without current pathological fracture: M81.0

## 2018-04-16 HISTORY — DX: Cardiac arrhythmia, unspecified: I49.9

## 2018-04-16 HISTORY — DX: Acute pancreatitis without necrosis or infection, unspecified: K85.90

## 2018-04-16 HISTORY — DX: Polyp of colon: K63.5

## 2018-04-16 LAB — GLUCOSE, CAPILLARY: GLUCOSE-CAPILLARY: 148 mg/dL — AB (ref 65–99)

## 2018-04-16 SURGERY — ESOPHAGOGASTRODUODENOSCOPY (EGD) WITH PROPOFOL
Anesthesia: General

## 2018-04-16 MED ORDER — LIDOCAINE 2% (20 MG/ML) 5 ML SYRINGE
INTRAMUSCULAR | Status: DC | PRN
  Administered 2018-04-16: 30 mg via INTRAVENOUS

## 2018-04-16 MED ORDER — PROPOFOL 10 MG/ML IV BOLUS
INTRAVENOUS | Status: AC
  Filled 2018-04-16: qty 20

## 2018-04-16 MED ORDER — SODIUM CHLORIDE 0.9 % IV SOLN
INTRAVENOUS | Status: DC | PRN
  Administered 2018-04-16: 07:00:00 via INTRAVENOUS

## 2018-04-16 MED ORDER — PHENYLEPHRINE HCL 10 MG/ML IJ SOLN
INTRAMUSCULAR | Status: AC
  Filled 2018-04-16: qty 1

## 2018-04-16 MED ORDER — FENTANYL CITRATE (PF) 100 MCG/2ML IJ SOLN
INTRAMUSCULAR | Status: AC
  Filled 2018-04-16: qty 2

## 2018-04-16 MED ORDER — PROPOFOL 500 MG/50ML IV EMUL
INTRAVENOUS | Status: AC
  Filled 2018-04-16: qty 50

## 2018-04-16 MED ORDER — PROPOFOL 10 MG/ML IV BOLUS
INTRAVENOUS | Status: DC | PRN
  Administered 2018-04-16: 100 mg via INTRAVENOUS

## 2018-04-16 MED ORDER — PROPOFOL 500 MG/50ML IV EMUL
INTRAVENOUS | Status: DC | PRN
  Administered 2018-04-16: 140 ug/kg/min via INTRAVENOUS

## 2018-04-16 MED ORDER — LIDOCAINE HCL (PF) 2 % IJ SOLN
INTRAMUSCULAR | Status: AC
  Filled 2018-04-16: qty 10

## 2018-04-16 MED ORDER — LIDOCAINE HCL (PF) 1 % IJ SOLN: 2.0000 mL | Freq: Once | INTRAMUSCULAR | Status: DC

## 2018-04-16 MED ORDER — EPHEDRINE SULFATE 50 MG/ML IJ SOLN
INTRAMUSCULAR | Status: AC
  Filled 2018-04-16: qty 1

## 2018-04-16 MED ORDER — SODIUM CHLORIDE 0.9 % IV SOLN: INTRAVENOUS | Status: DC

## 2018-04-16 MED ORDER — LIDOCAINE HCL (PF) 1 % IJ SOLN
INTRAMUSCULAR | Status: AC
  Filled 2018-04-16: qty 2

## 2018-04-16 MED ORDER — FENTANYL CITRATE (PF) 100 MCG/2ML IJ SOLN
INTRAMUSCULAR | Status: DC | PRN
  Administered 2018-04-16: 50 ug via INTRAVENOUS

## 2018-04-16 MED ORDER — SODIUM CHLORIDE 0.9 % IJ SOLN
INTRAMUSCULAR | Status: AC
  Filled 2018-04-16: qty 10

## 2018-04-16 NOTE — Anesthesia Preprocedure Evaluation (Signed)
Anesthesia Evaluation  Patient identified by MRN, date of birth, ID band Patient awake    Reviewed: Allergy & Precautions, H&P , NPO status , Patient's Chart, lab work & pertinent test results, reviewed documented beta blocker date and time   History of Anesthesia Complications Negative for: history of anesthetic complications  Airway Mallampati: II  TM Distance: >3 FB Neck ROM: full    Dental  (+) Caps, Dental Advidsory Given, Teeth Intact   Pulmonary neg pulmonary ROS, former smoker,           Cardiovascular Exercise Tolerance: Good hypertension, (-) angina(-) CAD, (-) Past MI, (-) Cardiac Stents and (-) CABG + dysrhythmias + Valvular Problems/Murmurs      Neuro/Psych negative neurological ROS  negative psych ROS   GI/Hepatic Neg liver ROS, GERD  ,  Endo/Other  diabetes  Renal/GU negative Renal ROS  negative genitourinary   Musculoskeletal   Abdominal   Peds  Hematology negative hematology ROS (+)   Anesthesia Other Findings Past Medical History: No date: Arrhythmia No date: Arthritis No date: Cancer ([place])     Comment:  skin No date: CMV (cytomegalovirus infection) ([place]) No date: Colon polyps No date: Diabetes mellitus, type 2 ([facility]) No date: Duodenitis No date: FH: colonic polyps     Comment:  hx of-adenomatous No date: GERD (gastroesophageal reflux disease) No date: Heart murmur     Comment:  dx'd in 24s. followed by PCP .: Hemorrhoid No date: Hyperlipidemia No date: Hypertension No date: IBS (irritable bowel syndrome) No date: Osteoporosis No date: Osteoporosis No date: Pancreatitis     Comment:  drug induced   Reproductive/Obstetrics negative OB ROS                             Anesthesia Physical Anesthesia Plan  ASA: III  Anesthesia Plan: General   Post-op Pain Management:    Induction: Intravenous  PONV Risk Score and Plan: 3 and Propofol infusion  Airway  Management Planned: Nasal Cannula  Additional Equipment:   Intra-op Plan:   Post-operative Plan:   Informed Consent: I have reviewed the patients History and Physical, chart, labs and discussed the procedure including the risks, benefits and alternatives for the proposed anesthesia with the patient or authorized representative who has indicated his/her understanding and acceptance.   Dental Advisory Given  Plan Discussed with: Anesthesiologist, CRNA and Surgeon  Anesthesia Plan Comments:         Anesthesia Quick Evaluation

## 2018-04-16 NOTE — Op Note (Signed)
[facility] Patient Name: Molly [name] Procedure Date: 04/16/2018 7:36 AM MRN: [id number] Account #: [id number] Date of Birth: 19-Mar-1941 Admit Type: Outpatient Age: 77 Room: [facility] ENDO ROOM 1 Gender: Female Note Status: Finalized Procedure:            Upper GI endoscopy Indications:          Epigastric abdominal pain Providers:            Manya Silvas, MD Referring MD:         Leonie Douglas. Doy Hutching, MD (Referring MD) Medicines:            Propofol per Anesthesia Complications:        No immediate complications. Procedure:            Pre-Anesthesia Assessment:                       - After reviewing the risks and benefits, the patient                        was deemed in satisfactory condition to undergo the                        procedure.                       After obtaining informed consent, the endoscope was                        passed under direct vision. Throughout the procedure,                        the patient's blood pressure, pulse, and oxygen                        saturations were monitored continuously. The                        Colonoscope was introduced through the mouth, and                        advanced to the second part of duodenum. The upper GI                        endoscopy was accomplished without difficulty. The                        patient tolerated the procedure. Findings:      The examined esophagus was normal. GEJ 39-40cm from teeth.      The stomach was normal.      The examined duodenum was normal. Impression:           - Normal esophagus.                       - Normal stomach.                       - Normal examined duodenum.                       - No specimens collected. Recommendation:       - The findings and recommendations were discussed with  the patient's family. Manya Silvas, MD 04/16/2018 7:46:05 AM This report has been signed electronically. Number of Addenda:  0 Note Initiated On: 04/16/2018 7:36 AM      [facility]

## 2018-04-16 NOTE — Transfer of Care (Signed)
Immediate Anesthesia Transfer of Care Note  Patient: Molly [name]  Procedure(s) Performed: ESOPHAGOGASTRODUODENOSCOPY (EGD) WITH PROPOFOL (N/A )  Patient Location: PACU and Endoscopy Unit  Anesthesia Type:General  Level of Consciousness: awake and patient cooperative  Airway & Oxygen Therapy: Patient Spontanous Breathing and Patient connected to nasal cannula oxygen  Post-op Assessment: Report given to RN and Post -op Vital signs reviewed and stable  Post vital signs: Reviewed and stable  Last Vitals:  Vitals Value Taken Time  BP    Temp    Pulse    Resp    SpO2      Last Pain:  Vitals:   04/16/18 0720  TempSrc: Tympanic  PainSc: 0-No pain         Complications: No apparent anesthesia complications

## 2018-04-16 NOTE — Anesthesia Post-op Follow-up Note (Signed)
Anesthesia QCDR form completed.        

## 2018-04-16 NOTE — H&P (Signed)
Primary Care Physician:  Idelle Crouch, MD Primary Gastroenterologist:  Dr. Vira Agar  Pre-Procedure History & Physical: HPI:  Molly [name] is a 77 y.o. female is here for an endoscopy.  Done for evaluation of epigastric abd pain.   Past Medical History:  Diagnosis Date  . Arrhythmia   . Arthritis   . Cancer ([place])    skin  . CMV (cytomegalovirus infection) ([place])   . Colon polyps   . Diabetes mellitus, type 2 ([place])   . Duodenitis   . FH: colonic polyps    hx of-adenomatous  . GERD (gastroesophageal reflux disease)   . Heart murmur    dx'd in 1960s. followed by PCP  . Hemorrhoid .  Marland Kitchen Hyperlipidemia   . Hypertension   . IBS (irritable bowel syndrome)   . Osteoporosis   . Osteoporosis   . Pancreatitis    drug induced    Past Surgical History:  Procedure Laterality Date  . ABDOMINAL HYSTERECTOMY    . Carotids  3/08   mild only  . CATARACT EXTRACTION W/PHACO Right 06/29/2017   Procedure: CATARACT EXTRACTION PHACO AND INTRAOCULAR LENS PLACEMENT ([place])  Toric Diabetic Right;  Surgeon: Eulogio Bear, MD;  Location: [facility];  Service: Ophthalmology;  Laterality: Right;  diabetic - oral meds  . CATARACT EXTRACTION W/PHACO Right 07/27/2017   Procedure: CATARACT EXTRACTION PHACO AND INTRAOCULAR LENS PLACEMENT ([place]) LEFT DIABETIC TORIC;  Surgeon: Eulogio Bear, MD;  Location: [facility];  Service: Ophthalmology;  Laterality: Right;  Diabetic - oral meds  . COLONOSCOPY    . cystocele, cystourethropexy  12/87  . dexa     increase T - 3.1 spine, normal femur 1/04  . ESOPHAGOGASTRODUODENOSCOPY    . hysterectomy (other)  1980  . TUBAL LIGATION  1978  . vaginal deliveries     x2    Prior to Admission medications   Medication Sig Start Date End Date Taking? Authorizing Provider  glipiZIDE (GLUCOTROL) 10 MG tablet Take 10 mg by mouth 2 (two) times daily before a meal.   Yes [provider]  insulin glargine (LANTUS) 100 UNIT/ML injection  Inject 14 Units into the skin daily.   Yes [provider]  potassium chloride (KLOR-CON) 20 MEQ packet Take by mouth 2 (two) times daily.   Yes [provider]  acetaminophen (TYLENOL) 325 MG tablet Take 2 tablets (650 mg total) by mouth every 6 (six) hours as needed for mild pain or moderate pain (or Fever >/= 101). 03/22/18   Loletha Grayer, MD  Biotin 2500 MCG CAPS Take by mouth 2 (two) times daily.    [provider]  CALCIUM PO Take 750 mg by mouth 2 (two) times daily.    [provider]  Cholecalciferol (VITAMIN D PO) Take 500 Units by mouth 2 (two) times daily.    [provider]  glucose blood (ACCU-CHEK COMPACT STRIPS) test strip 1 each by Other route as directed. Use as instructed     [provider]  Lancet Devices [facility]) lancets 1 each by Other route as needed. Use as instructed     [provider]  metFORMIN (GLUCOPHAGE-XR) 500 MG 24 hr tablet Take 1,000 mg by mouth 2 (two) times daily. 02/25/18   [provider]  metoprolol tartrate (LOPRESSOR) 25 MG tablet Take 1 tablet (25 mg total) by mouth 2 (two) times daily. 03/22/18   Loletha Grayer, MD  Multiple Vitamins-Minerals (MULTIVITAL) tablet Take 1 tablet by mouth  daily.      [provider]  omeprazole (PRILOSEC) 20 MG capsule TAKE ONE CAPSULE BY MOUTH EVERY DAY 02/26/11   Lucille Passy, MD  polyethylene glycol [facility] / GLYCOLAX) packet Take 17 g by mouth daily as needed. 03/22/18   Loletha Grayer, MD  simvastatin (ZOCOR) 10 MG tablet Take 1 tablet (10 mg total) by mouth at bedtime. 12/16/11   Lucille Passy, MD    Allergies as of 04/15/2018 - Review Complete 03/18/2018  Allergen Reaction Noted  . Alendronate Nausea Only 06/22/2017  . Codeine Nausea Only   . Lansoprazole  09/08/2008  . Sulfonamide derivatives    . Adhesive [tape] Rash 06/22/2017    Family History  Problem Relation Age of Onset  . Heart failure Mother   . Coronary  artery disease Mother   . Heart attack Mother 73  . Diabetes Mother   . Heart failure Father   . Coronary artery disease Father   . Breast cancer Neg Hx     Social History   Socioeconomic History  . Marital status: Married    Spouse name: Not on file  . Number of children: Not on file  . Years of education: Not on file  . Highest education level: Not on file  Occupational History  . Occupation: retired  Scientific laboratory technician  . Financial resource strain: Not on file  . Food insecurity:    Worry: Not on file    Inability: Not on file  . Transportation needs:    Medical: Not on file    Non-medical: Not on file  Tobacco Use  . Smoking status: Former Smoker    Last attempt to quit: 1992    Years since quitting: 27.4  . Smokeless tobacco: Never Used  Substance and Sexual Activity  . Alcohol use: Yes    Comment: Occ -1x/mo  . Drug use: No  . Sexual activity: Not on file  Lifestyle  . Physical activity:    Days per week: Not on file    Minutes per session: Not on file  . Stress: Not on file  Relationships  . Social connections:    Talks on phone: Not on file    Gets together: Not on file    Attends religious service: Not on file    Active member of club or organization: Not on file    Attends meetings of clubs or organizations: Not on file    Relationship status: Not on file  . Intimate partner violence:    Fear of current or ex partner: Not on file    Emotionally abused: Not on file    Physically abused: Not on file    Forced sexual activity: Not on file  Other Topics Concern  . Not on file  Social History Narrative  . Not on file    Review of Systems: See HPI, otherwise negative ROS  Physical Exam: BP (!) 153/58   Pulse 63   Temp (!) 96.3 F (35.7 C) (Tympanic)   Resp 17   Ht 5\' 6"  (1.676 m)   Wt 68 kg (150 lb)   SpO2 100%   BMI 24.21 kg/m  General:   Alert,  pleasant and cooperative in NAD Head:  Normocephalic and atraumatic. Neck:  Supple; no masses or  thyromegaly. Lungs:  Clear throughout to auscultation.    Heart:  Regular rate and rhythm. Abdomen:  Soft, nontender and nondistended. Normal bowel sounds, without guarding, and without rebound.   Neurologic:  Alert  and  oriented x4;  grossly normal neurologically.  Impression/Plan: Molly [name] is here for an endoscopy to be performed for epigastric abd pain.  Risks, benefits, limitations, and alternatives regarding  endoscopy have been reviewed with the patient.  Questions have been answered.  All parties agreeable.   Gaylyn Cheers, MD  04/16/2018, 7:33 AM

## 2018-04-19 ENCOUNTER — Encounter: Payer: Self-pay | Admitting: Unknown Physician Specialty

## 2018-04-19 NOTE — Anesthesia Postprocedure Evaluation (Signed)
Anesthesia Post Note  Patient: Molly [name]  Procedure(s) Performed: ESOPHAGOGASTRODUODENOSCOPY (EGD) WITH PROPOFOL (N/A )  Patient location during evaluation: Endoscopy Anesthesia Type: General Level of consciousness: awake and alert Pain management: pain level controlled Vital Signs Assessment: post-procedure vital signs reviewed and stable Respiratory status: spontaneous breathing, nonlabored ventilation, respiratory function stable and patient connected to nasal cannula oxygen Cardiovascular status: blood pressure returned to baseline and stable Postop Assessment: no apparent nausea or vomiting Anesthetic complications: no     Last Vitals:  Vitals:   04/16/18 0800 04/16/18 0810  BP: 138/64 (!) 155/67  Pulse: 64 63  Resp: 18 17  Temp:    SpO2: 100% 99%    Last Pain:  Vitals:   04/17/18 1103  TempSrc:   PainSc: 0-No pain                 Martha Clan

## 2018-04-21 DIAGNOSIS — K2980 Duodenitis without bleeding: Secondary | ICD-10-CM | POA: Diagnosis not present

## 2018-04-21 DIAGNOSIS — K219 Gastro-esophageal reflux disease without esophagitis: Secondary | ICD-10-CM | POA: Diagnosis not present

## 2018-04-21 DIAGNOSIS — Z8719 Personal history of other diseases of the digestive system: Secondary | ICD-10-CM | POA: Diagnosis not present

## 2018-04-21 DIAGNOSIS — D649 Anemia, unspecified: Secondary | ICD-10-CM | POA: Diagnosis not present

## 2018-04-28 ENCOUNTER — Ambulatory Visit: Admit: 2018-04-28 | Payer: Medicare HMO | Admitting: Internal Medicine

## 2018-05-04 DIAGNOSIS — E1129 Type 2 diabetes mellitus with other diabetic kidney complication: Secondary | ICD-10-CM | POA: Diagnosis not present

## 2018-05-04 DIAGNOSIS — R809 Proteinuria, unspecified: Secondary | ICD-10-CM | POA: Diagnosis not present

## 2018-05-04 DIAGNOSIS — M810 Age-related osteoporosis without current pathological fracture: Secondary | ICD-10-CM | POA: Diagnosis not present

## 2018-05-04 DIAGNOSIS — E113293 Type 2 diabetes mellitus with mild nonproliferative diabetic retinopathy without macular edema, bilateral: Secondary | ICD-10-CM | POA: Diagnosis not present

## 2018-05-10 DIAGNOSIS — R69 Illness, unspecified: Secondary | ICD-10-CM | POA: Diagnosis not present

## 2018-05-27 DIAGNOSIS — R7989 Other specified abnormal findings of blood chemistry: Secondary | ICD-10-CM | POA: Diagnosis not present

## 2018-05-27 DIAGNOSIS — I10 Essential (primary) hypertension: Secondary | ICD-10-CM | POA: Diagnosis not present

## 2018-05-27 DIAGNOSIS — E1129 Type 2 diabetes mellitus with other diabetic kidney complication: Secondary | ICD-10-CM | POA: Diagnosis not present

## 2018-05-27 DIAGNOSIS — E7800 Pure hypercholesterolemia, unspecified: Secondary | ICD-10-CM | POA: Diagnosis not present

## 2018-05-27 DIAGNOSIS — Z79899 Other long term (current) drug therapy: Secondary | ICD-10-CM | POA: Diagnosis not present

## 2018-05-27 DIAGNOSIS — M810 Age-related osteoporosis without current pathological fracture: Secondary | ICD-10-CM | POA: Diagnosis not present

## 2018-05-27 DIAGNOSIS — R809 Proteinuria, unspecified: Secondary | ICD-10-CM | POA: Diagnosis not present

## 2018-06-04 DIAGNOSIS — K858 Other acute pancreatitis without necrosis or infection: Secondary | ICD-10-CM | POA: Diagnosis not present

## 2018-06-04 DIAGNOSIS — R69 Illness, unspecified: Secondary | ICD-10-CM | POA: Diagnosis not present

## 2018-06-04 DIAGNOSIS — E1129 Type 2 diabetes mellitus with other diabetic kidney complication: Secondary | ICD-10-CM | POA: Diagnosis not present

## 2018-06-04 DIAGNOSIS — E113293 Type 2 diabetes mellitus with mild nonproliferative diabetic retinopathy without macular edema, bilateral: Secondary | ICD-10-CM | POA: Diagnosis not present

## 2018-06-04 DIAGNOSIS — M810 Age-related osteoporosis without current pathological fracture: Secondary | ICD-10-CM | POA: Diagnosis not present

## 2018-06-04 DIAGNOSIS — I10 Essential (primary) hypertension: Secondary | ICD-10-CM | POA: Diagnosis not present

## 2018-06-04 DIAGNOSIS — Z79899 Other long term (current) drug therapy: Secondary | ICD-10-CM | POA: Diagnosis not present

## 2018-06-04 DIAGNOSIS — E7800 Pure hypercholesterolemia, unspecified: Secondary | ICD-10-CM | POA: Diagnosis not present

## 2018-06-04 DIAGNOSIS — R011 Cardiac murmur, unspecified: Secondary | ICD-10-CM | POA: Diagnosis not present

## 2018-06-04 DIAGNOSIS — Z1239 Encounter for other screening for malignant neoplasm of breast: Secondary | ICD-10-CM | POA: Diagnosis not present

## 2018-06-04 DIAGNOSIS — R809 Proteinuria, unspecified: Secondary | ICD-10-CM | POA: Diagnosis not present

## 2018-06-04 DIAGNOSIS — Z0000 Encounter for general adult medical examination without abnormal findings: Secondary | ICD-10-CM | POA: Diagnosis not present

## 2018-06-22 DIAGNOSIS — R011 Cardiac murmur, unspecified: Secondary | ICD-10-CM | POA: Diagnosis not present

## 2018-08-21 DIAGNOSIS — R69 Illness, unspecified: Secondary | ICD-10-CM | POA: Diagnosis not present

## 2018-08-23 ENCOUNTER — Other Ambulatory Visit: Payer: Self-pay | Admitting: Internal Medicine

## 2018-08-23 DIAGNOSIS — Z1231 Encounter for screening mammogram for malignant neoplasm of breast: Secondary | ICD-10-CM

## 2018-09-07 DIAGNOSIS — Z79899 Other long term (current) drug therapy: Secondary | ICD-10-CM | POA: Diagnosis not present

## 2018-09-07 DIAGNOSIS — R809 Proteinuria, unspecified: Secondary | ICD-10-CM | POA: Diagnosis not present

## 2018-09-07 DIAGNOSIS — K858 Other acute pancreatitis without necrosis or infection: Secondary | ICD-10-CM | POA: Diagnosis not present

## 2018-09-07 DIAGNOSIS — I10 Essential (primary) hypertension: Secondary | ICD-10-CM | POA: Diagnosis not present

## 2018-09-07 DIAGNOSIS — E1129 Type 2 diabetes mellitus with other diabetic kidney complication: Secondary | ICD-10-CM | POA: Diagnosis not present

## 2018-09-07 DIAGNOSIS — E7800 Pure hypercholesterolemia, unspecified: Secondary | ICD-10-CM | POA: Diagnosis not present

## 2018-09-14 DIAGNOSIS — M810 Age-related osteoporosis without current pathological fracture: Secondary | ICD-10-CM | POA: Diagnosis not present

## 2018-09-14 DIAGNOSIS — R809 Proteinuria, unspecified: Secondary | ICD-10-CM | POA: Diagnosis not present

## 2018-09-14 DIAGNOSIS — E7800 Pure hypercholesterolemia, unspecified: Secondary | ICD-10-CM | POA: Diagnosis not present

## 2018-09-14 DIAGNOSIS — E1129 Type 2 diabetes mellitus with other diabetic kidney complication: Secondary | ICD-10-CM | POA: Diagnosis not present

## 2018-09-14 DIAGNOSIS — E113293 Type 2 diabetes mellitus with mild nonproliferative diabetic retinopathy without macular edema, bilateral: Secondary | ICD-10-CM | POA: Diagnosis not present

## 2018-09-14 DIAGNOSIS — Z8719 Personal history of other diseases of the digestive system: Secondary | ICD-10-CM | POA: Diagnosis not present

## 2018-09-14 DIAGNOSIS — I10 Essential (primary) hypertension: Secondary | ICD-10-CM | POA: Diagnosis not present

## 2018-09-16 ENCOUNTER — Ambulatory Visit
Admission: RE | Admit: 2018-09-16 | Discharge: 2018-09-16 | Disposition: A | Discharge status: Home / Self Care | Payer: Medicare HMO | Source: Ambulatory Visit | Attending: Internal Medicine | Admitting: Internal Medicine

## 2018-09-16 DIAGNOSIS — Z1231 Encounter for screening mammogram for malignant neoplasm of breast: Secondary | ICD-10-CM | POA: Insufficient documentation

## 2018-09-20 ENCOUNTER — Other Ambulatory Visit: Payer: Self-pay | Admitting: Internal Medicine

## 2018-09-20 DIAGNOSIS — R928 Other abnormal and inconclusive findings on diagnostic imaging of breast: Secondary | ICD-10-CM

## 2018-09-27 ENCOUNTER — Ambulatory Visit
Admission: RE | Admit: 2018-09-27 | Discharge: 2018-09-27 | Disposition: A | Discharge status: Home / Self Care | Payer: Medicare HMO | Source: Ambulatory Visit | Attending: Internal Medicine | Admitting: Internal Medicine

## 2018-09-27 DIAGNOSIS — R928 Other abnormal and inconclusive findings on diagnostic imaging of breast: Secondary | ICD-10-CM | POA: Insufficient documentation

## 2019-05-16 DIAGNOSIS — Z86010 Personal history of colonic polyps: Secondary | ICD-10-CM | POA: Insufficient documentation

## 2019-05-16 DIAGNOSIS — M810 Age-related osteoporosis without current pathological fracture: Secondary | ICD-10-CM | POA: Insufficient documentation

## 2019-05-18 DIAGNOSIS — K760 Fatty (change of) liver, not elsewhere classified: Secondary | ICD-10-CM | POA: Diagnosis present

## 2019-07-15 ENCOUNTER — Other Ambulatory Visit: Payer: Self-pay

## 2019-07-15 ENCOUNTER — Other Ambulatory Visit
Admission: RE | Admit: 2019-07-15 | Discharge: 2019-07-15 | Disposition: A | Discharge status: Home / Self Care | Payer: Medicare HMO | Source: Ambulatory Visit | Attending: Internal Medicine | Admitting: Internal Medicine

## 2019-07-15 DIAGNOSIS — Z20828 Contact with and (suspected) exposure to other viral communicable diseases: Secondary | ICD-10-CM | POA: Diagnosis not present

## 2019-07-15 DIAGNOSIS — K219 Gastro-esophageal reflux disease without esophagitis: Secondary | ICD-10-CM | POA: Insufficient documentation

## 2019-07-15 DIAGNOSIS — Z01812 Encounter for preprocedural laboratory examination: Secondary | ICD-10-CM | POA: Diagnosis present

## 2019-07-15 DIAGNOSIS — Z86010 Personal history of colonic polyps: Secondary | ICD-10-CM | POA: Insufficient documentation

## 2019-07-16 LAB — SARS CORONAVIRUS 2 (TAT 6-24 HRS): SARS Coronavirus 2: NEGATIVE

## 2019-07-19 ENCOUNTER — Encounter: Payer: Self-pay | Admitting: *Deleted

## 2019-07-20 ENCOUNTER — Ambulatory Visit
Admission: RE | Admit: 2019-07-20 | Discharge: 2019-07-20 | Disposition: A | Discharge status: Home / Self Care | Payer: Medicare HMO | Attending: Internal Medicine | Admitting: Internal Medicine

## 2019-07-20 ENCOUNTER — Ambulatory Visit: Payer: Medicare HMO | Admitting: Anesthesiology

## 2019-07-20 ENCOUNTER — Encounter
Admission: RE | Disposition: A | Discharge status: Home / Self Care | Payer: Self-pay | Source: Home / Self Care | Attending: Internal Medicine

## 2019-07-20 ENCOUNTER — Other Ambulatory Visit: Payer: Self-pay

## 2019-07-20 ENCOUNTER — Encounter: Payer: Self-pay | Admitting: Emergency Medicine

## 2019-07-20 DIAGNOSIS — D125 Benign neoplasm of sigmoid colon: Secondary | ICD-10-CM | POA: Insufficient documentation

## 2019-07-20 DIAGNOSIS — R933 Abnormal findings on diagnostic imaging of other parts of digestive tract: Secondary | ICD-10-CM | POA: Insufficient documentation

## 2019-07-20 DIAGNOSIS — Z885 Allergy status to narcotic agent status: Secondary | ICD-10-CM | POA: Insufficient documentation

## 2019-07-20 DIAGNOSIS — R1013 Epigastric pain: Secondary | ICD-10-CM | POA: Insufficient documentation

## 2019-07-20 DIAGNOSIS — Z86010 Personal history of colonic polyps: Secondary | ICD-10-CM | POA: Insufficient documentation

## 2019-07-20 DIAGNOSIS — K2950 Unspecified chronic gastritis without bleeding: Secondary | ICD-10-CM | POA: Insufficient documentation

## 2019-07-20 DIAGNOSIS — E785 Hyperlipidemia, unspecified: Secondary | ICD-10-CM | POA: Diagnosis not present

## 2019-07-20 DIAGNOSIS — M1990 Unspecified osteoarthritis, unspecified site: Secondary | ICD-10-CM | POA: Diagnosis not present

## 2019-07-20 DIAGNOSIS — K449 Diaphragmatic hernia without obstruction or gangrene: Secondary | ICD-10-CM | POA: Insufficient documentation

## 2019-07-20 DIAGNOSIS — K219 Gastro-esophageal reflux disease without esophagitis: Secondary | ICD-10-CM | POA: Diagnosis not present

## 2019-07-20 DIAGNOSIS — K640 First degree hemorrhoids: Secondary | ICD-10-CM | POA: Insufficient documentation

## 2019-07-20 DIAGNOSIS — Z79899 Other long term (current) drug therapy: Secondary | ICD-10-CM | POA: Diagnosis not present

## 2019-07-20 DIAGNOSIS — Z1211 Encounter for screening for malignant neoplasm of colon: Secondary | ICD-10-CM | POA: Diagnosis not present

## 2019-07-20 DIAGNOSIS — E119 Type 2 diabetes mellitus without complications: Secondary | ICD-10-CM | POA: Diagnosis not present

## 2019-07-20 DIAGNOSIS — I10 Essential (primary) hypertension: Secondary | ICD-10-CM | POA: Insufficient documentation

## 2019-07-20 DIAGNOSIS — Z794 Long term (current) use of insulin: Secondary | ICD-10-CM | POA: Insufficient documentation

## 2019-07-20 DIAGNOSIS — Z882 Allergy status to sulfonamides status: Secondary | ICD-10-CM | POA: Diagnosis not present

## 2019-07-20 DIAGNOSIS — K317 Polyp of stomach and duodenum: Secondary | ICD-10-CM | POA: Insufficient documentation

## 2019-07-20 DIAGNOSIS — Z888 Allergy status to other drugs, medicaments and biological substances status: Secondary | ICD-10-CM | POA: Insufficient documentation

## 2019-07-20 HISTORY — PX: COLONOSCOPY WITH PROPOFOL: SHX5780

## 2019-07-20 HISTORY — DX: Cardiac arrhythmia, unspecified: I49.9

## 2019-07-20 HISTORY — PX: ESOPHAGOGASTRODUODENOSCOPY (EGD) WITH PROPOFOL: SHX5813

## 2019-07-20 HISTORY — DX: Fatty (change of) liver, not elsewhere classified: K76.0

## 2019-07-20 LAB — GLUCOSE, CAPILLARY: Glucose-Capillary: 189 mg/dL — ABNORMAL HIGH (ref 70–99)

## 2019-07-20 SURGERY — ESOPHAGOGASTRODUODENOSCOPY (EGD) WITH PROPOFOL
Anesthesia: General

## 2019-07-20 MED ORDER — LIDOCAINE HCL (CARDIAC) PF 100 MG/5ML IV SOSY
PREFILLED_SYRINGE | INTRAVENOUS | Status: DC | PRN
  Administered 2019-07-20: 100 mg via INTRAVENOUS

## 2019-07-20 MED ORDER — PROPOFOL 10 MG/ML IV BOLUS
INTRAVENOUS | Status: AC
  Filled 2019-07-20: qty 80

## 2019-07-20 MED ORDER — SODIUM CHLORIDE 0.9 % IV SOLN
INTRAVENOUS | Status: DC
  Administered 2019-07-20: 1000 mL via INTRAVENOUS

## 2019-07-20 MED ORDER — PROPOFOL 10 MG/ML IV BOLUS
INTRAVENOUS | Status: DC | PRN
  Administered 2019-07-20 (×2): 30 mg via INTRAVENOUS
  Administered 2019-07-20: 70 mg via INTRAVENOUS
  Administered 2019-07-20 (×2): 30 mg via INTRAVENOUS

## 2019-07-20 MED ORDER — PROPOFOL 500 MG/50ML IV EMUL
INTRAVENOUS | Status: DC | PRN
  Administered 2019-07-20: 100 ug/kg/min via INTRAVENOUS

## 2019-07-20 NOTE — Transfer of Care (Signed)
Immediate Anesthesia Transfer of Care Note  Patient: Molly [name]  Procedure(s) Performed: ESOPHAGOGASTRODUODENOSCOPY (EGD) WITH PROPOFOL (N/A ) COLONOSCOPY WITH PROPOFOL (N/A )  Patient Location: PACU and Endoscopy Unit  Anesthesia Type:General  Level of Consciousness: awake, oriented, drowsy and patient cooperative  Airway & Oxygen Therapy: Patient Spontanous Breathing  Post-op Assessment: Report given to RN, Post -op Vital signs reviewed and stable and Patient moving all extremities  Post vital signs: Reviewed and stable  Last Vitals:  Vitals Value Taken Time  BP 131/87 07/20/19 1031  Temp    Pulse 76 07/20/19 1031  Resp 19 07/20/19 1031  SpO2 96 % 07/20/19 1031  Vitals shown include unvalidated device data.  Last Pain:  Vitals:   07/20/19 1031  TempSrc:   PainSc: 0-No pain         Complications: No apparent anesthesia complications

## 2019-07-20 NOTE — Anesthesia Post-op Follow-up Note (Signed)
Anesthesia QCDR form completed.        

## 2019-07-20 NOTE — Interval H&P Note (Signed)
History and Physical Interval Note:  07/20/2019 9:24 AM  Molly [name]  has presented today for surgery, with the diagnosis of ph polyps gerd.  The various methods of treatment have been discussed with the patient and family. After consideration of risks, benefits and other options for treatment, the patient has consented to  Procedure(s): ESOPHAGOGASTRODUODENOSCOPY (EGD) WITH PROPOFOL (N/A) COLONOSCOPY WITH PROPOFOL (N/A) as a surgical intervention.  The patient's history has been reviewed, patient examined, no change in status, stable for surgery.  I have reviewed the patient's chart and labs.  Questions were answered to the patient's satisfaction.     [place], [place]

## 2019-07-20 NOTE — H&P (Signed)
Outpatient short stay form Pre-procedure 07/20/2019 9:23 AM Amil Moseman K. Alice Reichert, M.D.  Primary Physician: Fulton Reek, M.D.  Reason for visit:  Epigastric pain, abnormal CT abdomen, personal hx of colon polyps.  History of present illness:  Patient with intermittent epigastric pain had duodenitis on CT scan. Last EGD in 03/2018 showed no abnormalities.                           Patient presents for colonoscopy for a personal hx of colon polyps. The patient denies abdominal pain, abnormal weight loss or rectal bleeding.     Current Facility-Administered Medications:  .  0.9 %  sodium chloride infusion, , Intravenous, Continuous, [place], Benay Pike, MD, Last Rate: 20 mL/hr at 07/20/19 0912, 1,000 mL at 07/20/19 0912  Medications Prior to Admission  Medication Sig Dispense Refill Last Dose  . acetaminophen (TYLENOL) 325 MG tablet Take 2 tablets (650 mg total) by mouth every 6 (six) hours as needed for mild pain or moderate pain (or Fever >/= 101).   Past Week at Unknown time  . Biotin 2500 MCG CAPS Take by mouth 2 (two) times daily.   Past Week at Unknown time  . CALCIUM PO Take 750 mg by mouth 2 (two) times daily.   Past Week at Unknown time  . Cholecalciferol (VITAMIN D PO) Take 500 Units by mouth 2 (two) times daily.   Past Week at Unknown time  . glipiZIDE (GLUCOTROL) 10 MG tablet Take 10 mg by mouth 2 (two) times daily before a meal.   07/19/2019 at Unknown time  . glucose blood (ACCU-CHEK COMPACT STRIPS) test strip 1 each by Other route as directed. Use as instructed    Past Week at Unknown time  . insulin glargine (LANTUS) 100 UNIT/ML injection Inject 14 Units into the skin daily.   07/19/2019 at Unknown time  . Lancet Devices (ACCU-CHEK SOFTCLIX) lancets 1 each by Other route as needed. Use as instructed    Past Week at Unknown time  . lisinopril (ZESTRIL) 5 MG tablet Take 5 mg by mouth daily.   07/19/2019 at Unknown time  . metFORMIN (GLUCOPHAGE-XR) 500 MG 24 hr tablet Take 1,000 mg by mouth 2  (two) times daily.  1 07/19/2019 at Unknown time  . metoprolol tartrate (LOPRESSOR) 25 MG tablet Take 1 tablet (25 mg total) by mouth 2 (two) times daily. 60 tablet 0 07/20/2019 at Unknown time  . Multiple Vitamins-Minerals (MULTIVITAL) tablet Take 1 tablet by mouth daily.     07/19/2019 at Unknown time  . omeprazole (PRILOSEC) 20 MG capsule TAKE ONE CAPSULE BY MOUTH EVERY DAY 30 capsule 1 07/19/2019 at Unknown time  . polyethylene glycol (MIRALAX / GLYCOLAX) packet Take 17 g by mouth daily as needed. 14 each 0 07/19/2019 at Unknown time  . potassium chloride (KLOR-CON) 20 MEQ packet Take by mouth 2 (two) times daily.   07/19/2019 at Unknown time  . simvastatin (ZOCOR) 10 MG tablet Take 1 tablet (10 mg total) by mouth at bedtime. 30 tablet 0 07/19/2019 at Unknown time     Allergies  Allergen Reactions  . Alendronate Nausea Only  . Codeine Nausea Only  . Lansoprazole     REACTION: hurts stomach  . Sulfonamide Derivatives     Childhood reaction (doesn't remember)  . Adhesive [Tape] Rash    Blisters if on too long     Past Medical History:  Diagnosis Date  . Arrhythmia   . Arthritis   .  Cancer ([place])    skin  . CMV (cytomegalovirus infection) ([place])   . Colon polyps   . Diabetes mellitus, type 2 ([place])   . Duodenitis   . Dysrhythmia   . Fatty liver disease, nonalcoholic   . FH: colonic polyps    hx of-adenomatous  . GERD (gastroesophageal reflux disease)   . Heart murmur    dx'd in 1960s. followed by PCP  . Hemorrhoid .  Marland Kitchen Hyperlipidemia   . Hypertension   . IBS (irritable bowel syndrome)   . Osteoporosis   . Osteoporosis   . Pancreatitis    drug induced    Review of systems:  Otherwise negative.    Physical Exam  Gen: Alert, oriented. Appears stated age.  HEENT: [place]/AT. PERRLA. Lungs: CTA, no wheezes. CV: RR nl S1, S2. Abd: soft, benign, no masses. BS+ Ext: No edema. Pulses 2+    Planned procedures: Proceed with EGD and colonoscopy. The patient understands the nature of  the planned procedure, indications, risks, alternatives and potential complications including but not limited to bleeding, infection, perforation, damage to internal organs and possible oversedation/side effects from anesthesia. The patient agrees and gives consent to proceed.  Please refer to procedure notes for findings, recommendations and patient disposition/instructions.     Jewelle Whitner K. Alice Reichert, M.D. Gastroenterology 07/20/2019  9:23 AM

## 2019-07-20 NOTE — Anesthesia Preprocedure Evaluation (Signed)
Anesthesia Evaluation  Patient identified by MRN, date of birth, ID band Patient awake    Reviewed: Allergy & Precautions, H&P , NPO status , Patient's Chart, lab work & pertinent test results, reviewed documented beta blocker date and time   Airway Mallampati: II   Neck ROM: full    Dental  (+) Poor Dentition   Pulmonary neg pulmonary ROS, former smoker,    Pulmonary exam normal        Cardiovascular Exercise Tolerance: Poor hypertension, On Medications Normal cardiovascular exam+ dysrhythmias + Valvular Problems/Murmurs  Rhythm:regular Rate:Normal     Neuro/Psych negative neurological ROS  negative psych ROS   GI/Hepatic Neg liver ROS, GERD  ,  Endo/Other  negative endocrine ROSdiabetes, Well Controlled, Type 2, Oral Hypoglycemic Agents, Insulin Dependent  Renal/GU negative Renal ROS  negative genitourinary   Musculoskeletal   Abdominal   Peds  Hematology negative hematology ROS (+)   Anesthesia Other Findings Past Medical History: No date: Arrhythmia No date: Arthritis No date: Cancer ([place])     Comment:  skin No date: CMV (cytomegalovirus infection) ([place]) No date: Colon polyps No date: Diabetes mellitus, type 2 ([facility]) No date: Duodenitis No date: Dysrhythmia No date: Fatty liver disease, nonalcoholic No date: FH: colonic polyps     Comment:  hx of-adenomatous No date: GERD (gastroesophageal reflux disease) No date: Heart murmur     Comment:  dx'd in 50s. followed by PCP .: Hemorrhoid No date: Hyperlipidemia No date: Hypertension No date: IBS (irritable bowel syndrome) No date: Osteoporosis No date: Osteoporosis No date: Pancreatitis     Comment:  drug induced Past Surgical History: No date: ABDOMINAL HYSTERECTOMY 3/08: Carotids     Comment:  mild only 06/29/2017: CATARACT EXTRACTION W/PHACO; Right     Comment:  Procedure: CATARACT EXTRACTION PHACO AND INTRAOCULAR               LENS PLACEMENT  ([place])  Toric Diabetic Right;  Surgeon:               Eulogio Bear, MD;  Location: [facility];                Service: Ophthalmology;  Laterality: Right;  diabetic -               oral meds 07/27/2017: CATARACT EXTRACTION W/PHACO; Right     Comment:  Procedure: CATARACT EXTRACTION PHACO AND INTRAOCULAR               LENS PLACEMENT ([place]) LEFT DIABETIC TORIC;  Surgeon: Eulogio Bear, MD;  Location: [facility];                Service: Ophthalmology;  Laterality: Right;  Diabetic -               oral meds No date: COLONOSCOPY 12/87: cystocele, cystourethropexy No date: dexa     Comment:  increase T - 3.1 spine, normal femur 1/04 No date: ESOPHAGOGASTRODUODENOSCOPY 04/16/2018: ESOPHAGOGASTRODUODENOSCOPY (EGD) WITH PROPOFOL; N/A     Comment:  Procedure: ESOPHAGOGASTRODUODENOSCOPY (EGD) WITH               PROPOFOL;  Surgeon: Manya Silvas, MD;  Location[facility]               [facility] ENDOSCOPY;  Service: Endoscopy;  Laterality: N/A; No date: EYE SURGERY 1980: hysterectomy (other) 1978: TUBAL LIGATION No date: vaginal deliveries  Comment:  x2 BMI    Body Mass Index: 27.96 kg/m     Reproductive/Obstetrics negative OB ROS                             Anesthesia Physical Anesthesia Plan  ASA: III  Anesthesia Plan: General   Post-op Pain Management:    Induction:   PONV Risk Score and Plan:   Airway Management Planned:   Additional Equipment:   Intra-op Plan:   Post-operative Plan:   Informed Consent: I have reviewed the patients History and Physical, chart, labs and discussed the procedure including the risks, benefits and alternatives for the proposed anesthesia with the patient or authorized representative who has indicated his/her understanding and acceptance.     Dental Advisory Given  Plan Discussed with: CRNA  Anesthesia Plan Comments:         Anesthesia Quick Evaluation

## 2019-07-20 NOTE — Op Note (Signed)
[facility] Gastroenterology Patient Name: Molly [name] Procedure Date: 07/20/2019 9:47 AM MRN: [id number] Account #: [id number] Date of Birth: 02/13/1941 Admit Type: Outpatient Age: 78 Room: [facility] ENDO ROOM 3 Gender: Female Note Status: Finalized Procedure:            Colonoscopy Indications:          Surveillance: Personal history of adenomatous polyps on                        last colonoscopy > 5 years ago Providers:            Lorie Apley K. [place] MD, MD Medicines:            Propofol per Anesthesia Complications:        No immediate complications. Procedure:            Pre-Anesthesia Assessment:                       - The risks and benefits of the procedure and the                        sedation options and risks were discussed with the                        patient. All questions were answered and informed                        consent was obtained.                       - Patient identification and proposed procedure were                        verified prior to the procedure by the nurse. The                        procedure was verified in the procedure room.                       - ASA Grade Assessment: III - A patient with severe                        systemic disease.                       - After reviewing the risks and benefits, the patient                        was deemed in satisfactory condition to undergo the                        procedure.                       After obtaining informed consent, the colonoscope was                        passed under direct vision. Throughout the procedure,                        the patient's blood pressure, pulse, and oxygen  saturations were monitored continuously. The                        Colonoscope was introduced through the anus and                        advanced to the the cecum, identified by appendiceal                        orifice and ileocecal valve. The patient tolerated  the                        procedure well. The quality of the bowel preparation                        was good. The ileocecal valve, appendiceal orifice, and                        rectum were photographed. The colonoscopy was                        technically difficult and complex due to restricted                        mobility of the colon. Successful completion of the                        procedure was aided by withdrawing the scope and                        replacing with the pediatric endoscope. The patient                        tolerated the procedure well. The quality of the bowel                        preparation was good. Findings:      The perianal and digital rectal examinations were normal. Pertinent       negatives include normal sphincter tone and no palpable rectal lesions.      A 5 mm polyp was found in the sigmoid colon. The polyp was       semi-pedunculated. The polyp was removed with a jumbo cold forceps.       Resection and retrieval were complete.      Non-bleeding internal hemorrhoids were found during retroflexion. The       hemorrhoids were Grade I (internal hemorrhoids that do not prolapse). Impression:           - One 5 mm polyp in the sigmoid colon, removed with a                        jumbo cold forceps. Resected and retrieved.                       - Non-bleeding internal hemorrhoids. Recommendation:       - Await pathology results.                       - Await pathology results from EGD, also performed  today. Procedure Code(s):    --- Professional ---                       [phone], Colonoscopy, flexible; with biopsy, single or                        multiple CPT copyright 2019 American Medical Association. All rights reserved. The codes documented in this report are preliminary and upon coder review may  be revised to meet current compliance requirements. Efrain Sella MD, MD 07/20/2019 10:33:01 AM This report has been  signed electronically. Number of Addenda: 0 Note Initiated On: 07/20/2019 9:47 AM Scope Withdrawal Time: 0 hours 7 minutes 31 seconds  Total Procedure Duration: 0 hours 19 minutes 20 seconds  Estimated Blood Loss: Estimated blood loss: none.      [facility]

## 2019-07-20 NOTE — Op Note (Signed)
[facility] Gastroenterology Patient Name: Molly [name] Procedure Date: 07/20/2019 9:47 AM MRN: [id number] Account #: [id number] Date of Birth: 08-30-1941 Admit Type: Outpatient Age: 78 Room: [facility] ENDO ROOM 3 Gender: Female Note Status: Finalized Procedure:            Upper GI endoscopy Indications:          Epigastric abdominal pain, Abnormal CT of the GI tract Providers:            Boykin Nearing. Jac Romulus MD, MD Medicines:            Propofol per Anesthesia Complications:        No immediate complications. Procedure:            Pre-Anesthesia Assessment:                       - The risks and benefits of the procedure and the                        sedation options and risks were discussed with the                        patient. All questions were answered and informed                        consent was obtained.                       - Patient identification and proposed procedure were                        verified prior to the procedure by the nurse. The                        procedure was verified in the procedure room.                       - ASA Grade Assessment: III - A patient with severe                        systemic disease.                       - After reviewing the risks and benefits, the patient                        was deemed in satisfactory condition to undergo the                        procedure.                       After obtaining informed consent, the endoscope was                        passed under direct vision. Throughout the procedure,                        the patient's blood pressure, pulse, and oxygen                        saturations were monitored continuously. The Endoscope  was introduced through the mouth, and advanced to the                        third part of duodenum. The upper GI endoscopy was                        accomplished without difficulty. The patient tolerated                        the  procedure well. Findings:      The esophagus was normal.      Patchy mild inflammation characterized by erythema was found in the       gastric antrum. Biopsies were taken with a cold forceps for Helicobacter       pylori testing.      A single 8 mm pedunculated polyp with no bleeding and no stigmata of       recent bleeding was found in the gastric antrum. Biopsies were taken       with a cold forceps for histology.      A 1 cm hiatal hernia was present.      The examined duodenum was normal.      The exam was otherwise without abnormality. Impression:           - Normal esophagus.                       - Gastritis. Biopsied.                       - A single gastric polyp. Biopsied.                       - 1 cm hiatal hernia.                       - Normal examined duodenum.                       - The examination was otherwise normal. Recommendation:       - Await pathology results.                       - Proceed with colonoscopy Procedure Code(s):    --- Professional ---                       [id number], Esophagogastroduodenoscopy, flexible, transoral;                        with biopsy, single or multiple Diagnosis Code(s):    --- Professional ---                       R93.3, Abnormal findings on diagnostic imaging of other                        parts of digestive tract                       R10.13, Epigastric pain                       K44.9, Diaphragmatic hernia without obstruction or  gangrene                       K31.7, Polyp of stomach and duodenum                       K29.70, Gastritis, unspecified, without bleeding CPT copyright 2019 American Medical Association. All rights reserved. The codes documented in this report are preliminary and upon coder review may  be revised to meet current compliance requirements. Stanton Kidney MD, MD 07/20/2019 10:04:57 AM This report has been signed electronically. Number of Addenda: 0 Note Initiated On: 07/20/2019  9:47 AM Estimated Blood Loss: Estimated blood loss: none.      [facility]

## 2019-07-21 ENCOUNTER — Encounter: Payer: Self-pay | Admitting: Internal Medicine

## 2019-07-22 LAB — SURGICAL PATHOLOGY

## 2019-07-22 NOTE — Anesthesia Postprocedure Evaluation (Signed)
Anesthesia Post Note  Patient: Molly [name]  Procedure(s) Performed: ESOPHAGOGASTRODUODENOSCOPY (EGD) WITH PROPOFOL (N/A ) COLONOSCOPY WITH PROPOFOL (N/A )  Patient location during evaluation: PACU Anesthesia Type: General Level of consciousness: awake and alert Pain management: pain level controlled Vital Signs Assessment: post-procedure vital signs reviewed and stable Respiratory status: spontaneous breathing, nonlabored ventilation, respiratory function stable and patient connected to nasal cannula oxygen Cardiovascular status: blood pressure returned to baseline and stable Postop Assessment: no apparent nausea or vomiting Anesthetic complications: no     Last Vitals:  Vitals:   07/20/19 1051 07/20/19 1101  BP: (!) 146/72 (!) 162/78  Pulse: 69 65  Resp: 16 16  Temp:    SpO2: 98% 100%    Last Pain:  Vitals:   07/21/19 0719  TempSrc:   PainSc: 0-No pain                 [name]

## 2019-08-09 ENCOUNTER — Inpatient Hospital Stay: Payer: Medicare HMO

## 2019-08-09 ENCOUNTER — Emergency Department: Payer: Medicare HMO

## 2019-08-09 ENCOUNTER — Other Ambulatory Visit: Payer: Self-pay

## 2019-08-09 ENCOUNTER — Encounter: Payer: Self-pay | Admitting: Emergency Medicine

## 2019-08-09 ENCOUNTER — Inpatient Hospital Stay
Admission: EM | Admit: 2019-08-09 | Discharge: 2019-08-11 | DRG: 440 | Disposition: A | Discharge status: Home / Self Care | Payer: Medicare HMO | Attending: Internal Medicine | Admitting: Internal Medicine

## 2019-08-09 DIAGNOSIS — Z794 Long term (current) use of insulin: Secondary | ICD-10-CM

## 2019-08-09 DIAGNOSIS — M109 Gout, unspecified: Secondary | ICD-10-CM | POA: Diagnosis present

## 2019-08-09 DIAGNOSIS — Z833 Family history of diabetes mellitus: Secondary | ICD-10-CM

## 2019-08-09 DIAGNOSIS — R1033 Periumbilical pain: Secondary | ICD-10-CM | POA: Diagnosis present

## 2019-08-09 DIAGNOSIS — Z20828 Contact with and (suspected) exposure to other viral communicable diseases: Secondary | ICD-10-CM | POA: Diagnosis present

## 2019-08-09 DIAGNOSIS — Z91048 Other nonmedicinal substance allergy status: Secondary | ICD-10-CM | POA: Diagnosis not present

## 2019-08-09 DIAGNOSIS — R0902 Hypoxemia: Secondary | ICD-10-CM

## 2019-08-09 DIAGNOSIS — Z87891 Personal history of nicotine dependence: Secondary | ICD-10-CM

## 2019-08-09 DIAGNOSIS — Z8249 Family history of ischemic heart disease and other diseases of the circulatory system: Secondary | ICD-10-CM

## 2019-08-09 DIAGNOSIS — Z7984 Long term (current) use of oral hypoglycemic drugs: Secondary | ICD-10-CM

## 2019-08-09 DIAGNOSIS — Z8262 Family history of osteoporosis: Secondary | ICD-10-CM

## 2019-08-09 DIAGNOSIS — K760 Fatty (change of) liver, not elsewhere classified: Secondary | ICD-10-CM | POA: Diagnosis present

## 2019-08-09 DIAGNOSIS — Z888 Allergy status to other drugs, medicaments and biological substances status: Secondary | ICD-10-CM

## 2019-08-09 DIAGNOSIS — K219 Gastro-esophageal reflux disease without esophagitis: Secondary | ICD-10-CM | POA: Diagnosis present

## 2019-08-09 DIAGNOSIS — E119 Type 2 diabetes mellitus without complications: Secondary | ICD-10-CM | POA: Diagnosis present

## 2019-08-09 DIAGNOSIS — M1990 Unspecified osteoarthritis, unspecified site: Secondary | ICD-10-CM | POA: Diagnosis present

## 2019-08-09 DIAGNOSIS — Z882 Allergy status to sulfonamides status: Secondary | ICD-10-CM

## 2019-08-09 DIAGNOSIS — Z885 Allergy status to narcotic agent status: Secondary | ICD-10-CM | POA: Diagnosis not present

## 2019-08-09 DIAGNOSIS — E785 Hyperlipidemia, unspecified: Secondary | ICD-10-CM | POA: Diagnosis present

## 2019-08-09 DIAGNOSIS — Z85828 Personal history of other malignant neoplasm of skin: Secondary | ICD-10-CM

## 2019-08-09 DIAGNOSIS — I10 Essential (primary) hypertension: Secondary | ICD-10-CM | POA: Diagnosis present

## 2019-08-09 DIAGNOSIS — K859 Acute pancreatitis without necrosis or infection, unspecified: Secondary | ICD-10-CM | POA: Diagnosis present

## 2019-08-09 LAB — HEMOGLOBIN A1C
Hgb A1c MFr Bld: 7.5 % — ABNORMAL HIGH (ref 4.8–5.6)
Mean Plasma Glucose: 168.55 mg/dL

## 2019-08-09 LAB — BLOOD GAS, ARTERIAL
Acid-Base Excess: 2 mmol/L (ref 0.0–2.0)
Bicarbonate: 25.6 mmol/L (ref 20.0–28.0)
FIO2: 0.28
O2 Saturation: 97.8 %
Patient temperature: 37
pCO2 arterial: 36 mmHg (ref 32.0–48.0)
pH, Arterial: 7.46 — ABNORMAL HIGH (ref 7.350–7.450)
pO2, Arterial: 95 mmHg (ref 83.0–108.0)

## 2019-08-09 LAB — COMPREHENSIVE METABOLIC PANEL
ALT: 16 U/L (ref 0–44)
AST: 26 U/L (ref 15–41)
Albumin: 4.1 g/dL (ref 3.5–5.0)
Alkaline Phosphatase: 37 U/L — ABNORMAL LOW (ref 38–126)
Anion gap: 15 (ref 5–15)
BUN: 16 mg/dL (ref 8–23)
CO2: 20 mmol/L — ABNORMAL LOW (ref 22–32)
Calcium: 9.5 mg/dL (ref 8.9–10.3)
Chloride: 101 mmol/L (ref 98–111)
Creatinine, Ser: 0.63 mg/dL (ref 0.44–1.00)
GFR calc Af Amer: >60 mL/min (ref >60)
GFR calc non Af Amer: >60 mL/min (ref >60)
Glucose, Bld: 257 mg/dL — ABNORMAL HIGH (ref 70–99)
Potassium: 4.2 mmol/L (ref 3.5–5.1)
Sodium: 136 mmol/L (ref 135–145)
Total Bilirubin: 0.7 mg/dL (ref 0.3–1.2)
Total Protein: 7 g/dL (ref 6.5–8.1)

## 2019-08-09 LAB — CBC
HCT: 41.3 % (ref 36.0–46.0)
Hemoglobin: 13 g/dL (ref 12.0–15.0)
MCH: 26.2 pg (ref 26.0–34.0)
MCHC: 31.5 g/dL (ref 30.0–36.0)
MCV: 83.3 fL (ref 80.0–100.0)
Platelets: 332 10*3/uL (ref 150–400)
RBC: 4.96 MIL/uL (ref 3.87–5.11)
RDW: 16.3 % — ABNORMAL HIGH (ref 11.5–15.5)
WBC: 12.3 10*3/uL — ABNORMAL HIGH (ref 4.0–10.5)
nRBC: 0 % (ref 0.0–0.2)

## 2019-08-09 LAB — GLUCOSE, CAPILLARY
Glucose-Capillary: 93 mg/dL (ref 70–99)
Glucose-Capillary: 74 mg/dL (ref 70–99)

## 2019-08-09 LAB — LIPASE, BLOOD: Lipase: 897 U/L — ABNORMAL HIGH (ref 11–51)

## 2019-08-09 MED ORDER — SODIUM CHLORIDE 0.9 % IV SOLN
INTRAVENOUS | Status: DC
  Administered 2019-08-09 – 2019-08-11 (×5): via INTRAVENOUS

## 2019-08-09 MED ORDER — SODIUM CHLORIDE 0.9 % IV BOLUS
500.0000 mL | Freq: Once | INTRAVENOUS | Status: AC
  Administered 2019-08-09: 14:00:00 500 mL via INTRAVENOUS

## 2019-08-09 MED ORDER — IOHEXOL 300 MG/ML  SOLN
100.0000 mL | Freq: Once | INTRAMUSCULAR | Status: AC | PRN
  Administered 2019-08-09: 100 mL via INTRAVENOUS

## 2019-08-09 MED ORDER — ONDANSETRON HCL 4 MG/2ML IJ SOLN
4.0000 mg | Freq: Once | INTRAMUSCULAR | Status: AC
  Administered 2019-08-09: 4 mg via INTRAVENOUS
  Filled 2019-08-09: qty 2

## 2019-08-09 MED ORDER — SODIUM CHLORIDE 0.9% FLUSH: 3.0000 mL | Freq: Once | INTRAVENOUS | Status: DC

## 2019-08-09 MED ORDER — HYDROMORPHONE HCL 1 MG/ML IJ SOLN
0.5000 mg | INTRAMUSCULAR | Status: DC | PRN
  Administered 2019-08-10 – 2019-08-11 (×3): 0.5 mg via INTRAVENOUS
  Filled 2019-08-09 (×3): qty 0.5

## 2019-08-09 MED ORDER — HYDROMORPHONE HCL 1 MG/ML IJ SOLN
0.5000 mg | INTRAMUSCULAR | Status: AC
  Administered 2019-08-09: 0.5 mg via INTRAVENOUS
  Filled 2019-08-09: qty 1

## 2019-08-09 MED ORDER — ONDANSETRON HCL 4 MG/2ML IJ SOLN
4.0000 mg | Freq: Four times a day (QID) | INTRAMUSCULAR | Status: DC | PRN
  Administered 2019-08-10 – 2019-08-11 (×3): 4 mg via INTRAVENOUS
  Filled 2019-08-09 (×3): qty 2

## 2019-08-09 MED ORDER — INSULIN ASPART 100 UNIT/ML ~~LOC~~ SOLN: 0.0000 [IU] | Freq: Every day | SUBCUTANEOUS | Status: DC

## 2019-08-09 MED ORDER — ENOXAPARIN SODIUM 40 MG/0.4ML ~~LOC~~ SOLN
40.0000 mg | SUBCUTANEOUS | Status: DC
  Administered 2019-08-09 – 2019-08-10 (×2): 40 mg via SUBCUTANEOUS
  Filled 2019-08-09 (×2): qty 0.4

## 2019-08-09 MED ORDER — INSULIN ASPART 100 UNIT/ML ~~LOC~~ SOLN
0.0000 [IU] | Freq: Three times a day (TID) | SUBCUTANEOUS | Status: DC
  Administered 2019-08-11: 2 [IU] via SUBCUTANEOUS
  Filled 2019-08-09 (×2): qty 1

## 2019-08-09 NOTE — ED Notes (Signed)
Report given to 2C RN 

## 2019-08-09 NOTE — ED Provider Notes (Signed)
[facility] Emergency Department Provider Note  ____________________________________________   First MD Initiated Contact with Patient 08/09/19 1239     (approximate)  I have reviewed the triage vital signs and the nursing notes.   HISTORY  Chief Complaint Abdominal Pain    HPI Molly [name] is a 78 y.o. female here for evaluation of "pancreatitis pain"  Patient reports she is had a couple episodes of pancreatitis most recently hospitalized at [facility] in around June.  She reports its idiopathic.  This morning about 2 AM began experiencing pain severe in her mid abdomen with nausea.  No vomiting.  No diarrhea.  She reports the pains feels the same as previous pancreatitis for which they have never identified a cause  No chest pain or shortness of breath.  No fevers or chills.  No exposure to COVID.  Reports she was tested for COVID a few weeks ago and it was negative she has not been around anyone with it.   Past Medical History:  Diagnosis Date  . Arrhythmia   . Arthritis   . Cancer ([place])    skin  . CMV (cytomegalovirus infection) ([place])   . Colon polyps   . Diabetes mellitus, type 2 ([place])   . Duodenitis   . Dysrhythmia   . Fatty liver disease, nonalcoholic   . FH: colonic polyps    hx of-adenomatous  . GERD (gastroesophageal reflux disease)   . Heart murmur    dx'd in 1960s. followed by PCP  . Hemorrhoid .  Marland Kitchen Hyperlipidemia   . Hypertension   . IBS (irritable bowel syndrome)   . Osteoporosis   . Osteoporosis   . Pancreatitis    drug induced    Patient Active Problem List   Diagnosis Date Noted  . Acute pancreatitis 03/18/2018  . BACK PAIN, THORACIC REGION, LEFT 10/01/2010  . SINUSITIS - ACUTE-NOS 08/30/2009  . SLEEP DISORDER, CHRONIC 08/30/2009  . GOUT 04/03/2009  . GERD 09/08/2008  . PALPITATIONS 12/14/2007  . DIABETES MELLITUS, TYPE II 07/20/2007  . HYPERLIPIDEMIA 07/20/2007  . IBS 07/20/2007    Past Surgical History:   Procedure Laterality Date  . ABDOMINAL HYSTERECTOMY    . Carotids  3/08   mild only  . CATARACT EXTRACTION W/PHACO Right 06/29/2017   Procedure: CATARACT EXTRACTION PHACO AND INTRAOCULAR LENS PLACEMENT ([place])  Toric Diabetic Right;  Surgeon: Eulogio Bear, MD;  Location: [place];  Service: Ophthalmology;  Laterality: Right;  diabetic - oral meds  . CATARACT EXTRACTION W/PHACO Right 07/27/2017   Procedure: CATARACT EXTRACTION PHACO AND INTRAOCULAR LENS PLACEMENT ([place]) LEFT DIABETIC TORIC;  Surgeon: Eulogio Bear, MD;  Location: [place];  Service: Ophthalmology;  Laterality: Right;  Diabetic - oral meds  . COLONOSCOPY    . COLONOSCOPY WITH PROPOFOL N/A 07/20/2019   Procedure: COLONOSCOPY WITH PROPOFOL;  Surgeon: Toledo, Benay Pike, MD;  Location: [facility] ENDOSCOPY;  Service: Gastroenterology;  Laterality: N/A;  . cystocele, cystourethropexy  12/87  . dexa     increase T - 3.1 spine, normal femur 1/04  . ESOPHAGOGASTRODUODENOSCOPY    . ESOPHAGOGASTRODUODENOSCOPY (EGD) WITH PROPOFOL N/A 04/16/2018   Procedure: ESOPHAGOGASTRODUODENOSCOPY (EGD) WITH PROPOFOL;  Surgeon: Manya Silvas, MD;  Location: [facility] ENDOSCOPY;  Service: Endoscopy;  Laterality: N/A;  . ESOPHAGOGASTRODUODENOSCOPY (EGD) WITH PROPOFOL N/A 07/20/2019   Procedure: ESOPHAGOGASTRODUODENOSCOPY (EGD) WITH PROPOFOL;  Surgeon: Toledo, Benay Pike, MD;  Location: [facility] ENDOSCOPY;  Service: Gastroenterology;  Laterality: N/A;  . EYE SURGERY    .  hysterectomy (other)  1980  . TUBAL LIGATION  1978  . vaginal deliveries     x2    Prior to Admission medications   Medication Sig Start Date End Date Taking? Authorizing Provider  acetaminophen (TYLENOL) 325 MG tablet Take 2 tablets (650 mg total) by mouth every 6 (six) hours as needed for mild pain or moderate pain (or Fever >/= 101). 03/22/18   Loletha Grayer, MD  Biotin 2500 MCG CAPS Take by mouth 2 (two) times daily.    [provider]  CALCIUM PO Take 750  mg by mouth 2 (two) times daily.    [provider]  Cholecalciferol (VITAMIN D PO) Take 500 Units by mouth 2 (two) times daily.    [provider]  glipiZIDE (GLUCOTROL) 10 MG tablet Take 10 mg by mouth 2 (two) times daily before a meal.    [provider]  glucose blood (ACCU-CHEK COMPACT STRIPS) test strip 1 each by Other route as directed. Use as instructed     [provider]  insulin glargine (LANTUS) 100 UNIT/ML injection Inject 14 Units into the skin daily.    [provider]  Lancet [employer]) lancets 1 each by Other route as needed. Use as instructed     [provider]  lisinopril (ZESTRIL) 5 MG tablet Take 5 mg by mouth daily.    [provider]  metFORMIN (GLUCOPHAGE-XR) 500 MG 24 hr tablet Take 1,000 mg by mouth 2 (two) times daily. 02/25/18   [provider]  metoprolol tartrate (LOPRESSOR) 25 MG tablet Take 1 tablet (25 mg total) by mouth 2 (two) times daily. 03/22/18   Loletha Grayer, MD  Multiple Vitamins-Minerals (MULTIVITAL) tablet Take 1 tablet by mouth daily.      [provider]  omeprazole (PRILOSEC) 20 MG capsule TAKE ONE CAPSULE BY MOUTH EVERY DAY 02/26/11   Lucille Passy, MD  polyethylene glycol [employer] / GLYCOLAX) packet Take 17 g by mouth daily as needed. 03/22/18   Loletha Grayer, MD  potassium chloride (KLOR-CON) 20 MEQ packet Take by mouth 2 (two) times daily.    [provider]  simvastatin (ZOCOR) 10 MG tablet Take 1 tablet (10 mg total) by mouth at bedtime. 12/16/11   Lucille Passy, MD    Allergies Alendronate, Codeine, Lansoprazole, Sulfonamide derivatives, and Adhesive [tape]  Family History  Problem Relation Age of Onset  . Heart failure Mother   . Coronary artery disease Mother   . Heart attack Mother 44  . Diabetes Mother   . Heart failure Father   . Coronary artery disease Father   . Breast cancer Neg Hx     Social History Social History    Tobacco Use  . Smoking status: Former Smoker    Years: 20.00    Quit date: 1992    Years since quitting: 28.7  . Smokeless tobacco: Never Used  Substance Use Topics  . Alcohol use: Not Currently    Comment: Occ -1x/mo  . Drug use: No    Review of Systems Constitutional: No fever/chills Eyes: No visual changes. ENT: No sore throat. Cardiovascular: Denies chest pain. Respiratory: Denies shortness of breath. Gastrointestinal see HPI Genitourinary: Negative for dysuria. Musculoskeletal: Negative for back pain. Skin: Negative for rash. Neurological: Negative for headaches, areas of focal weakness or numbness.    ____________________________________________   PHYSICAL EXAM:  VITAL SIGNS: ED Triage Vitals  Enc Vitals Group     BP 08/09/19 0943 (!) 155/53  Pulse Rate 08/09/19 0943 82     Resp 08/09/19 1231 18     Temp 08/09/19 0943 97.9 F (36.6 C)     Temp Source 08/09/19 0943 Oral     SpO2 08/09/19 0943 95 %     Weight 08/09/19 0944 168 lb (76.2 kg)     Height 08/09/19 0944 5\' 5"  (1.651 m)     Head Circumference --      Peak Flow --      Pain Score 08/09/19 1231 8     Pain Loc --      Pain Edu? --      Excl. in [place]? --     Constitutional: Alert and oriented.  Appears in moderate pain, wincing.  Reports pain in the mid to upper abdomen. Eyes: Conjunctivae are normal. Head: Atraumatic. Nose: No congestion/rhinnorhea. Mouth/Throat: Mucous membranes are moist. Neck: No stridor.  Cardiovascular: Normal rate, regular rhythm. Grossly normal heart sounds.  Good peripheral circulation. Respiratory: Normal respiratory effort.  No retractions. Lungs CTAB. Gastrointestinal: Soft and tender in the mid to epigastric region, no rebound or guarding. No distention. Musculoskeletal: No lower extremity tenderness nor edema. Neurologic:  Normal speech and language. No gross focal neurologic deficits are appreciated.  Skin:  Skin is warm, dry and intact. No rash noted.  Psychiatric: Mood and affect are normal. Speech and behavior are normal.  ____________________________________________   LABS (all labs ordered are listed, but only abnormal results are displayed)  Labs Reviewed  LIPASE, BLOOD - Abnormal; Notable for the following components:      Result Value   Lipase 897 (*)    All other components within normal limits  COMPREHENSIVE METABOLIC PANEL - Abnormal; Notable for the following components:   CO2 20 (*)    Glucose, Bld 257 (*)    Alkaline Phosphatase 37 (*)    All other components within normal limits  CBC - Abnormal; Notable for the following components:   WBC 12.3 (*)    RDW 16.3 (*)    All other components within normal limits  SARS CORONAVIRUS 2 (TAT 6-24 HRS)  URINALYSIS, COMPLETE (UACMP) WITH MICROSCOPIC   ____________________________________________  EKG   ____________________________________________  RADIOLOGY  Ct Abdomen Pelvis W Contrast  Result Date: 08/09/2019 CLINICAL DATA:  Abdominal pain, nausea, and diarrhea beginning this morning. Previous pancreatitis. EXAM: CT ABDOMEN AND PELVIS WITH CONTRAST TECHNIQUE: Multidetector CT imaging of the abdomen and pelvis was performed using the standard protocol following bolus administration of intravenous contrast. CONTRAST:  150mL OMNIPAQUE IOHEXOL 300 MG/ML  SOLN COMPARISON:  03/18/2018 FINDINGS: Lower Chest: No acute findings. Hepatobiliary: No hepatic masses identified. Gallbladder is unremarkable. No evidence of biliary ductal dilatation. Pancreas: Mild peripancreatic inflammatory changes are seen, consistent with acute pancreatitis. No evidence of pancreatic mass or necrosis. No evidence of pancreatic ductal dilatation or pseudocysts. Spleen: Within normal limits in size and appearance. Adrenals/Urinary Tract: No masses identified. Stable small left renal cysts. No evidence of hydronephrosis. Stomach/Bowel: No evidence of obstruction, inflammatory process or abnormal fluid  collections. Normal appendix visualized. Vascular/Lymphatic: No pathologically enlarged lymph nodes. No abdominal aortic aneurysm. Aortic atherosclerosis. Reproductive: Prior hysterectomy noted. Adnexal regions are unremarkable in appearance. Other:  None. Musculoskeletal:  No suspicious bone lesions identified. IMPRESSION: Mild acute pancreatitis. No evidence of pancreatic necrosis, pseudocyst, or other complication. Aortic Atherosclerosis (ICD10-I70.0). Electronically Signed   By: Marlaine Hind M.D.   On: 08/09/2019 14:22    CT scan reviewed with evidence of acute pancreatitis without complication ____________________________________________  PROCEDURES  Procedure(s) performed: None  Procedures  Critical Care performed: No  ____________________________________________   INITIAL IMPRESSION / ASSESSMENT AND PLAN / ED COURSE  Pertinent labs & imaging results that were available during my care of the patient were reviewed by me and considered in my medical decision making (see chart for details).   Differential diagnosis includes but is not limited to, abdominal perforation, aortic dissection, cholecystitis, appendicitis, diverticulitis, colitis, esophagitis/gastritis, kidney stone, pyelonephritis, urinary tract infection, aortic aneurysm. All are considered in decision and treatment plan. Based upon the patient's presentation and risk factors, suspect likely recurrent pancreatitis.  Given the patient's age and elevated lipase, will proceed with CT scan.  She does not appear to have any chest pain or cardiac or pulmonary symptoms.  Doubt acute vascular etiology.   Clinical Course as of Aug 08 1448  Tue Aug 09, 2019  1428 Pain improved.  She is resting comfortably awaiting CT scan   [MQ]    Clinical Course User Index [MQ] Delman Kitten, MD   ----------------------------------------- 2:49 PM on 08/09/2019 -----------------------------------------  CT reviewed, evidence of acute  pancreatitis.  No complication noted.  Considering patient age, presentation amount of pain in her history discussed and will admit for bowel rest and further work-up under the hospitalist service.  Discussed case with [place].  Patient understanding agreeable with plan  ____________________________________________   FINAL CLINICAL IMPRESSION(S) / ED DIAGNOSES  Final diagnoses:  Acute pancreatitis, unspecified complication status, unspecified pancreatitis type        Note:  This document was prepared using Dragon voice recognition software and may include unintentional dictation errors       Delman Kitten, MD 08/09/19 1524

## 2019-08-09 NOTE — ED Triage Notes (Signed)
Says upper abd pain since 2am.  Feels like pancreas

## 2019-08-09 NOTE — ED Notes (Addendum)
Pt states she believes she has pancreatitis d/t pain in mid abdomen, nausea and diarrhea that started at 0200 this morning . Hx of pancreatitis in June. Denies fevers. Reports taking dilaudid this morning for a total of 8 mg with no relief.

## 2019-08-09 NOTE — Progress Notes (Signed)
Care Alignment Note  Advanced Directives Documents (Living Will, Power of Attorney) currently in the EHR no advanced directives documents available .  Has the patient discussed their wishes with their family/healthcare power of attorney yes. How much does the family or healthcare power of attorney know about their wishes. Patient's husband at bedside understands patient's medical conditions including diagnosis of acute pancreatitis.  Diabetes mellitus.  Agrees with patient's decision to be full code  What does the patient/decision maker understand about their medical condition and the natural course of their disease.  Acute pancreatitis.  Diabetes mellitus.  Hypoxia.  What is the patient/decision maker's biggest fear or concern for the future pain and suffering   What is the most important goal for this patient should their health condition worsen maintenance of function.  Current   Code Status: Full Code  Current code status has been reviewed/updated.  Time spent:17 minutes

## 2019-08-09 NOTE — ED Notes (Signed)
Pt desatted to 84%, placed on 2 L [place]. Came up to 96%. Will continue to monitor.

## 2019-08-09 NOTE — ED Notes (Signed)
X-ray at bedside

## 2019-08-09 NOTE — H&P (Signed)
[facility] [place] at [facility]   PATIENT NAME: Molly [name]    MR#:  [id number]  DATE OF BIRTH:  28-Jul-1941  DATE OF ADMISSION:  08/09/2019  PRIMARY CARE PHYSICIAN: Marguarite Arbour, MD   REQUESTING/REFERRING PHYSICIAN: Sharyn Creamer  CHIEF COMPLAINT:   Chief Complaint  Patient presents with  . Abdominal Pain  Nausea and vomiting  HISTORY OF PRESENT ILLNESS:  Molly [name]  is a 78 y.o. female with a known history of diabetes mellitus type 2, prior history of pancreatitis and nonalcoholic fatty liver disease who presented today emergency room with complaints of abdominal pains, nausea and vomiting that started today.  Abdominal pain is located in the epigastric region.  Denies any fevers.  No chest pain.  Patient was evaluated in the emergency room and noted to have had a transient episode of decreased oxygen saturation to 85%.  2 L of oxygen was applied.  Laboratory studies revealed elevated lipase of 897.  Mild leukocytosis with white count of 12.3.  CT scan of the abdomen and pelvis with contrast done revealed mild acute pancreatitis.  No evidence of pancreatic necrosis.  Gallbladder exam was unremarkable.  No evidence of biliary ductal dilatation.  Patient diagnosed with acute pancreatitis.  Medical service called to admit patient for further evaluation and management.  PAST MEDICAL HISTORY:   Past Medical History:  Diagnosis Date  . Arrhythmia   . Arthritis   . Cancer (HCC)    skin  . CMV (cytomegalovirus infection) (HCC)   . Colon polyps   . Diabetes mellitus, type 2 (HCC)   . Duodenitis   . Dysrhythmia   . Fatty liver disease, nonalcoholic   . FH: colonic polyps    hx of-adenomatous  . GERD (gastroesophageal reflux disease)   . Heart murmur    dx'd in 1960s. followed by PCP  . Hemorrhoid .  Marland Kitchen Hyperlipidemia   . Hypertension   . IBS (irritable bowel syndrome)   . Osteoporosis   . Osteoporosis   . Pancreatitis    drug induced    PAST SURGICAL  HISTORY:   Past Surgical History:  Procedure Laterality Date  . ABDOMINAL HYSTERECTOMY    . Carotids  3/08   mild only  . CATARACT EXTRACTION W/PHACO Right 06/29/2017   Procedure: CATARACT EXTRACTION PHACO AND INTRAOCULAR LENS PLACEMENT (IOC)  Toric Diabetic Right;  Surgeon: Nevada Crane, MD;  Location: [facility];  Service: Ophthalmology;  Laterality: Right;  diabetic - oral meds  . CATARACT EXTRACTION W/PHACO Right 07/27/2017   Procedure: CATARACT EXTRACTION PHACO AND INTRAOCULAR LENS PLACEMENT (IOC) LEFT DIABETIC TORIC;  Surgeon: Nevada Crane, MD;  Location: [facility];  Service: Ophthalmology;  Laterality: Right;  Diabetic - oral meds  . COLONOSCOPY    . COLONOSCOPY WITH PROPOFOL N/A 07/20/2019   Procedure: COLONOSCOPY WITH PROPOFOL;  Surgeon: Toledo, Boykin Nearing, MD;  Location: [facility] ENDOSCOPY;  Service: Gastroenterology;  Laterality: N/A;  . cystocele, cystourethropexy  12/87  . dexa     increase T - 3.1 spine, normal femur 1/04  . ESOPHAGOGASTRODUODENOSCOPY    . ESOPHAGOGASTRODUODENOSCOPY (EGD) WITH PROPOFOL N/A 04/16/2018   Procedure: ESOPHAGOGASTRODUODENOSCOPY (EGD) WITH PROPOFOL;  Surgeon: Scot Jun, MD;  Location: [facility] END[facility];  Service: Endoscopy;  Laterality: N/A;  . ESOPHAGOGASTRODUODENOSCOPY (EGD) WITH PROPOFOL N/A 07/20/2019   Procedure: ESOPHAGOGASTRODUODENOSCOPY (EGD) WITH PROPOFOL;  Surgeon: Toledo, Boykin Nearing, MD;  Location: [facility] ENDOSCOPY;  Service: Gastroenterology;  Laterality: N/A;  . EYE SURGERY    .  hysterectomy (other)  1980  . TUBAL LIGATION  1978  . vaginal deliveries     x2    SOCIAL HISTORY:   Social History   Tobacco Use  . Smoking status: Former Smoker    Years: 20.00    Quit date: 1992    Years since quitting: 28.7  . Smokeless tobacco: Never Used  Substance Use Topics  . Alcohol use: Not Currently    Comment: Occ -1x/mo    FAMILY HISTORY:   Family History  Problem Relation Age of Onset  . Heart failure  Mother   . Coronary artery disease Mother   . Heart attack Mother 51  . Diabetes Mother   . Heart failure Father   . Coronary artery disease Father   . Breast cancer Neg Hx     DRUG ALLERGIES:   Allergies  Allergen Reactions  . Alendronate Nausea Only  . Codeine Nausea Only  . Lansoprazole     REACTION: hurts stomach  . Sulfonamide Derivatives     Childhood reaction (doesn't remember)  . Adhesive [Tape] Rash    Blisters if on too long    REVIEW OF SYSTEMS:   Review of Systems  Constitutional: Negative for chills and fever.  HENT: Negative for hearing loss and tinnitus.   Eyes: Negative for blurred vision and double vision.  Respiratory: Negative for cough and shortness of breath.   Cardiovascular: Negative for chest pain.  Gastrointestinal: Positive for abdominal pain, nausea and vomiting. Negative for diarrhea and heartburn.  Genitourinary: Negative for dysuria and urgency.  Musculoskeletal: Negative for myalgias and neck pain.  Skin: Negative for itching and rash.  Neurological: Negative for dizziness and headaches.  Psychiatric/Behavioral: Negative for depression and hallucinations.    MEDICATIONS AT HOME:   Prior to Admission medications   Medication Sig Start Date End Date Taking? Authorizing Provider  acetaminophen (TYLENOL) 325 MG tablet Take 2 tablets (650 mg total) by mouth every 6 (six) hours as needed for mild pain or moderate pain (or Fever >/= 101). 03/22/18   Alford Highland, MD  Biotin 2500 MCG CAPS Take by mouth 2 (two) times daily.    [provider]  CALCIUM PO Take 750 mg by mouth 2 (two) times daily.    [provider]  Cholecalciferol (VITAMIN D PO) Take 500 Units by mouth 2 (two) times daily.    [provider]  glipiZIDE (GLUCOTROL) 10 MG tablet Take 10 mg by mouth 2 (two) times daily before a meal.    [provider]  glucose blood (ACCU-CHEK COMPACT STRIPS) test strip 1 each by Other route as directed. Use as  instructed     [provider]  insulin glargine (LANTUS) 100 UNIT/ML injection Inject 14 Units into the skin daily.    [provider]  Lancet Devices [facility]) lancets 1 each by Other route as needed. Use as instructed     [provider]  lisinopril (ZESTRIL) 5 MG tablet Take 5 mg by mouth daily.    [provider]  metFORMIN (GLUCOPHAGE-XR) 500 MG 24 hr tablet Take 1,000 mg by mouth 2 (two) times daily. 02/25/18   [provider]  metoprolol tartrate (LOPRESSOR) 25 MG tablet Take 1 tablet (25 mg total) by mouth 2 (two) times daily. 03/22/18   Alford Highland, MD  Multiple Vitamins-Minerals (MULTIVITAL) tablet Take 1 tablet by mouth daily.      [provider]  omeprazole (PRILOSEC) 20 MG capsule TAKE ONE CAPSULE BY MOUTH  EVERY DAY 02/26/11   Dianne Dun, MD  polyethylene glycol [facility] Ethelene Hal) packet Take 17 g by mouth daily as needed. 03/22/18   Alford Highland, MD  potassium chloride (KLOR-CON) 20 MEQ packet Take by mouth 2 (two) times daily.    [provider]  simvastatin (ZOCOR) 10 MG tablet Take 1 tablet (10 mg total) by mouth at bedtime. 12/16/11   Dianne Dun, MD      VITAL SIGNS:  Blood pressure (!) 157/55, pulse 66, temperature 97.9 F (36.6 C), temperature source Oral, resp. rate 14, height 5\' 5"  (1.651 m), weight 76.2 kg, SpO2 97 %.  PHYSICAL EXAMINATION:  Physical Exam  GENERAL:  78 y.o.-year-old patient lying in the bed with no acute distress.  EYES: Pupils equal, round, reactive to light and accommodation. No scleral icterus. Extraocular muscles intact.  HEENT: Head atraumatic, normocephalic. Oropharynx and nasopharynx clear.  NECK:  Supple, no jugular venous distention. No thyroid enlargement, no tenderness.  LUNGS: Normal breath sounds bilaterally, no wheezing, rales,rhonchi or crepitation. No use of accessory muscles of respiration.  CARDIOVASCULAR: S1, S2 normal. No murmurs, rubs, or  gallops.  ABDOMEN: Soft, mild epigastric tenderness.  No rebound or guarding.  Bowel sounds positive.   EXTREMITIES: No pedal edema, cyanosis, or clubbing.  NEUROLOGIC: Cranial nerves II through XII are intact. Muscle strength 5/5 in all extremities. Sensation intact. Gait not checked.  PSYCHIATRIC: The patient is alert and oriented x 3.  SKIN: No obvious rash, lesion, or ulcer.   LABORATORY PANEL:   CBC Recent Labs  Lab 08/09/19 0944  WBC 12.3*  HGB 13.0  HCT 41.3  PLT 332   ------------------------------------------------------------------------------------------------------------------  Chemistries  Recent Labs  Lab 08/09/19 0944  NA 136  K 4.2  CL 101  CO2 20*  GLUCOSE 257*  BUN 16  CREATININE 0.63  CALCIUM 9.5  AST 26  ALT 16  ALKPHOS 37*  BILITOT 0.7   ------------------------------------------------------------------------------------------------------------------  Cardiac Enzymes No results for input(s): TROPONINI in the last 168 hours. ------------------------------------------------------------------------------------------------------------------  RADIOLOGY:  Ct Abdomen Pelvis W Contrast  Result Date: 08/09/2019 CLINICAL DATA:  Abdominal pain, nausea, and diarrhea beginning this morning. Previous pancreatitis. EXAM: CT ABDOMEN AND PELVIS WITH CONTRAST TECHNIQUE: Multidetector CT imaging of the abdomen and pelvis was performed using the standard protocol following bolus administration of intravenous contrast. CONTRAST:  OMNIPAQUE IOHEXOL 300 MG/ML  SOLN COMPARISON:  03/18/2018 FINDINGS: Lower Chest: No acute findings. Hepatobiliary: No hepatic masses identified. Gallbladder is unremarkable. No evidence of biliary ductal dilatation. Pancreas: Mild peripancreatic inflammatory changes are seen, consistent with acute pancreatitis. No evidence of pancreatic mass or necrosis. No evidence of pancreatic ductal dilatation or pseudocysts. Spleen: Within normal  limits in size and appearance. Adrenals/Urinary Tract: No masses identified. Stable small left renal cysts. No evidence of hydronephrosis. Stomach/Bowel: No evidence of obstruction, inflammatory process or abnormal fluid collections. Normal appendix visualized. Vascular/Lymphatic: No pathologically enlarged lymph nodes. No abdominal aortic aneurysm. Aortic atherosclerosis. Reproductive: Prior hysterectomy noted. Adnexal regions are unremarkable in appearance. Other:  None. Musculoskeletal:  No suspicious bone lesions identified. IMPRESSION: Mild acute pancreatitis. No evidence of pancreatic necrosis, pseudocyst, or other complication. Aortic Atherosclerosis (ICD10-I70.0). Electronically Signed   By: Danae Orleans M.D.   On: 08/09/2019 14:22      IMPRESSION AND PLAN:  Patient is a 78 year old female with history of diabetes mellitus and prior diagnosis of pancreatitis being admitted for management of acute pancreatitis.  1.  Acute pancreatitis Patient presented with nausea and vomiting  and epigastric pain.  Elevated lipase level with evidence of mild pancreatitis on CT scan. Gallbladder exam unremarkable on CT scan.  Patient denies any history of alcohol use. Requested for lipid panel in a.m. N.p.o. for now with plans to initiate clear liquid diet in a.m and advance as tolerated. Antiemetics.  Pain control with PRN Dilaudid.  IV fluid hydration. Lipase level in a.m. If worsening in a.m., may consider GI consult in a.m.  2.  Diabetes mellitus type 2 Resume home meds after medication reconciliation is done. Placed on sliding scale insulin coverage.  Glycosylated hemoglobin level in a.m.  3.  Hypoxia Patient noted to have had a transient period of hypoxia with oxygen saturation down to 85 on room air. Patient placed on supplemental oxygen. Requested for stat chest x-ray and ABG. Monitor clinically  4.  Mild leukocytosis with white count of 13.3 Likely reactive.  Follow-up on CBC in a.m.  DVT  prophylaxis; Lovenox   All the records are reviewed and case discussed with ED provider. Management plans discussed with the patient, family and they are in agreement. Updated husband present at bedside on treatment plans.  CODE STATUS: Full code  TOTAL TIME TAKING CARE OF THIS PATIENT: 59 minutes.    Jackie Russman M.D on 08/09/2019 at 3:59 PM  Between 7am to 6pm - Pager - [phone]  After 6pm go to [email or web address] - Social research officer, government  [facility] [place] [facility]  Office  [phone]  CC: Primary care physician; Marguarite Arbour, MD   Note: This dictation was prepared with Dragon dictation along with smaller phrase technology. Any transcriptional errors that result from this process are unintentional.

## 2019-08-10 LAB — GLUCOSE, CAPILLARY
Glucose-Capillary: 80 mg/dL (ref 70–99)
Glucose-Capillary: 85 mg/dL (ref 70–99)
Glucose-Capillary: 79 mg/dL (ref 70–99)
Glucose-Capillary: 64 mg/dL — ABNORMAL LOW (ref 70–99)
Glucose-Capillary: 76 mg/dL (ref 70–99)
Glucose-Capillary: 88 mg/dL (ref 70–99)

## 2019-08-10 LAB — LIPID PANEL
Cholesterol: 156 mg/dL (ref 0–200)
HDL: 47 mg/dL (ref >40)
LDL Cholesterol: 67 mg/dL (ref 0–99)
Total CHOL/HDL Ratio: 3.3 RATIO
Triglycerides: 209 mg/dL — ABNORMAL HIGH (ref <150)
VLDL: 42 mg/dL — ABNORMAL HIGH (ref 0–40)

## 2019-08-10 LAB — URINALYSIS, COMPLETE (UACMP) WITH MICROSCOPIC
Bacteria, UA: NONE SEEN
Bilirubin Urine: NEGATIVE
Glucose, UA: NEGATIVE mg/dL
Hgb urine dipstick: NEGATIVE
Ketones, ur: NEGATIVE mg/dL
Nitrite: NEGATIVE
Protein, ur: NEGATIVE mg/dL
Specific Gravity, Urine: 1.012 (ref 1.005–1.030)
Squamous Epithelial / HPF: NONE SEEN (ref 0–5)
pH: 5 (ref 5.0–8.0)

## 2019-08-10 LAB — HEMOGLOBIN A1C
Hgb A1c MFr Bld: 7.4 % — ABNORMAL HIGH (ref 4.8–5.6)
Mean Plasma Glucose: 165.68 mg/dL

## 2019-08-10 LAB — CBC
HCT: 37.2 % (ref 36.0–46.0)
Hemoglobin: 11.6 g/dL — ABNORMAL LOW (ref 12.0–15.0)
MCH: 26.3 pg (ref 26.0–34.0)
MCHC: 31.2 g/dL (ref 30.0–36.0)
MCV: 84.4 fL (ref 80.0–100.0)
Platelets: 266 10*3/uL (ref 150–400)
RBC: 4.41 MIL/uL (ref 3.87–5.11)
RDW: 16.4 % — ABNORMAL HIGH (ref 11.5–15.5)
WBC: 9.3 10*3/uL (ref 4.0–10.5)
nRBC: 0 % (ref 0.0–0.2)

## 2019-08-10 LAB — PHOSPHORUS: Phosphorus: 5.7 mg/dL — ABNORMAL HIGH (ref 2.5–4.6)

## 2019-08-10 LAB — LIPASE, BLOOD: Lipase: 152 U/L — ABNORMAL HIGH (ref 11–51)

## 2019-08-10 LAB — SARS CORONAVIRUS 2 (TAT 6-24 HRS): SARS Coronavirus 2: NEGATIVE

## 2019-08-10 LAB — MAGNESIUM: Magnesium: 1.8 mg/dL (ref 1.7–2.4)

## 2019-08-10 MED ORDER — ATORVASTATIN CALCIUM 20 MG PO TABS
40.0000 mg | ORAL_TABLET | Freq: Every day | ORAL | Status: DC
  Filled 2019-08-10: qty 2

## 2019-08-10 MED ORDER — SIMVASTATIN 20 MG PO TABS
20.0000 mg | ORAL_TABLET | Freq: Every day | ORAL | Status: DC
  Administered 2019-08-10: 20:00:00 20 mg via ORAL
  Filled 2019-08-10 (×2): qty 1

## 2019-08-10 NOTE — Progress Notes (Signed)
[facility] - [place] at [facility]   PATIENT NAME: Molly [name]    MR#:  [id number]  DATE OF BIRTH:  1941/09/20  SUBJECTIVE:   Patient states that she is feeling a little bit better this morning.  Her abdominal pain has improved from yesterday.  She denies any additional episodes of vomiting.  No diarrhea.  She would like to try to drink some fluids today and see how she does.  REVIEW OF SYSTEMS:  Review of Systems  Constitutional: Negative for chills and fever.  HENT: Negative for congestion and sore throat.   Eyes: Negative for blurred vision and double vision.  Respiratory: Negative for cough and shortness of breath.   Cardiovascular: Negative for chest pain and palpitations.  Gastrointestinal: Positive for abdominal pain and nausea. Negative for vomiting.  Genitourinary: Negative for dysuria and urgency.  Musculoskeletal: Negative for back pain and neck pain.  Neurological: Negative for dizziness and headaches.  Psychiatric/Behavioral: Negative for depression. The patient is not nervous/anxious.     DRUG ALLERGIES:   Allergies  Allergen Reactions  . Alendronate Nausea Only  . Codeine Nausea Only  . Lansoprazole     REACTION: hurts stomach  . Sulfonamide Derivatives     Childhood reaction (doesn't remember)  . Adhesive [Tape] Rash    Blisters if on too long   VITALS:  Blood pressure (!) 136/59, pulse 66, temperature 97.8 F (36.6 C), temperature source Oral, resp. rate 16, height 5\' 5"  (1.651 m), weight 76.2 kg, SpO2 99 %. PHYSICAL EXAMINATION:  Physical Exam  GENERAL:  Laying in the bed with no acute distress.  HEENT: Head atraumatic, normocephalic. Pupils equal, round, reactive to light and accommodation. No scleral icterus. Extraocular muscles intact. Oropharynx and nasopharynx clear.  NECK:  Supple, no jugular venous distention. No thyroid enlargement. LUNGS: Lungs are clear to auscultation bilaterally. No wheezes, crackles, rhonchi. No use of  accessory muscles of respiration.  CARDIOVASCULAR: RRR, S1, S2 normal. No murmurs, rubs, or gallops.  ABDOMEN: Soft, nondistended. Bowel sounds present. + Mild diffuse tenderness to palpation, no rebound or guarding. EXTREMITIES: No pedal edema, cyanosis, or clubbing.  NEUROLOGIC: CN 2-12 intact, no focal deficits. 5/5 muscle strength throughout all extremities. Sensation intact throughout. Gait not checked.  PSYCHIATRIC: The patient is alert and oriented x 3.  SKIN: No obvious rash, lesion, or ulcer.  LABORATORY PANEL:  Female CBC Recent Labs  Lab 08/10/19 0441  WBC 9.3  HGB 11.6*  HCT 37.2  PLT 266   ------------------------------------------------------------------------------------------------------------------ Chemistries  Recent Labs  Lab 08/09/19 0944 08/10/19 0441  NA 136  --   K 4.2  --   CL 101  --   CO2 20*  --   GLUCOSE 257*  --   BUN 16  --   CREATININE 0.63  --   CALCIUM 9.5  --   MG  --  1.8  AST 26  --   ALT 16  --   ALKPHOS 37*  --   BILITOT 0.7  --    RADIOLOGY:  Dg Chest 1 View  Result Date: 08/09/2019 CLINICAL DATA:  Hypoxia EXAM: CHEST  1 VIEW COMPARISON:  None. FINDINGS: Heart is normal size. Mitral valve annular calcifications noted. No confluent opacities or effusions. No acute bony abnormality. IMPRESSION: No active disease. Electronically Signed   By: Charlett Nose M.D.   On: 08/09/2019 16:27   Ct Abdomen Pelvis W Contrast  Result Date: 08/09/2019 CLINICAL DATA:  Abdominal pain, nausea, and diarrhea beginning  this morning. Previous pancreatitis. EXAM: CT ABDOMEN AND PELVIS WITH CONTRAST TECHNIQUE: Multidetector CT imaging of the abdomen and pelvis was performed using the standard protocol following bolus administration of intravenous contrast. CONTRAST:  OMNIPAQUE IOHEXOL 300 MG/ML  SOLN COMPARISON:  03/18/2018 FINDINGS: Lower Chest: No acute findings. Hepatobiliary: No hepatic masses identified. Gallbladder is unremarkable. No evidence of  biliary ductal dilatation. Pancreas: Mild peripancreatic inflammatory changes are seen, consistent with acute pancreatitis. No evidence of pancreatic mass or necrosis. No evidence of pancreatic ductal dilatation or pseudocysts. Spleen: Within normal limits in size and appearance. Adrenals/Urinary Tract: No masses identified. Stable small left renal cysts. No evidence of hydronephrosis. Stomach/Bowel: No evidence of obstruction, inflammatory process or abnormal fluid collections. Normal appendix visualized. Vascular/Lymphatic: No pathologically enlarged lymph nodes. No abdominal aortic aneurysm. Aortic atherosclerosis. Reproductive: Prior hysterectomy noted. Adnexal regions are unremarkable in appearance. Other:  None. Musculoskeletal:  No suspicious bone lesions identified. IMPRESSION: Mild acute pancreatitis. No evidence of pancreatic necrosis, pseudocyst, or other complication. Aortic Atherosclerosis (ICD10-I70.0). Electronically Signed   By: Danae Orleans M.D.   On: 08/09/2019 14:22   ASSESSMENT AND PLAN:   Acute pancreatitis- improved from yesterday.  No history of alcohol use. -CT scan with unremarkable gallbladder -TG elevated to 209- switch from zocor to lipitor -Start clear liquid diet today and advance as tolerated -Continue IV fluids -Pain control  Hypertension- BP has been low-normal. -Holding home lisinopril and metoprolol, will restart as able  Type 2 diabetes- blood sugars have been well controlled  -Continue SSI -Takes Lantus at home- will restart when patient is tolerating a diet  Acute hypoxic respiratory failure- resolved.  Patient is stable on room air this morning. -Chest x-ray was unremarkable -Monitor  Leukocytosis-resolved.  WBC is normal this morning.  Likely reactive. -Monitor  Hyperlipidemia -Lipid panel with LDL 67, triglycerides 209 -Switch Zocor to Lipitor 40 mg daily  DVT prophylaxis- Lovenox  All the records are reviewed and case discussed with Care  Management/Social Worker. Management plans discussed with the patient, family and they are in agreement.  CODE STATUS: Full Code  TOTAL TIME TAKING CARE OF THIS PATIENT: 38 minutes.   More than 50% of the time was spent in counseling/coordination of care: YES  POSSIBLE D/C IN 1-2 DAYS, DEPENDING ON CLINICAL CONDITION.   Jinny Blossom Madge Therrien M.D on 08/10/2019 at 12:32 PM  Between 7am to 6pm - Pager [phone]  After 6pm go to [email or web address] - Social research officer, government  [facility] [place] [facility]  Office  [phone]  CC: Primary care physician; Marguarite Arbour, MD  Note: This dictation was prepared with Dragon dictation along with smaller phrase technology. Any transcriptional errors that result from this process are unintentional.

## 2019-08-11 LAB — GLUCOSE, CAPILLARY
Glucose-Capillary: 98 mg/dL (ref 70–99)
Glucose-Capillary: 162 mg/dL — ABNORMAL HIGH (ref 70–99)

## 2019-08-11 LAB — BASIC METABOLIC PANEL
Anion gap: 6 (ref 5–15)
BUN: 6 mg/dL — ABNORMAL LOW (ref 8–23)
CO2: 25 mmol/L (ref 22–32)
Calcium: 7.8 mg/dL — ABNORMAL LOW (ref 8.9–10.3)
Chloride: 111 mmol/L (ref 98–111)
Creatinine, Ser: 0.46 mg/dL (ref 0.44–1.00)
GFR calc Af Amer: >60 mL/min (ref >60)
GFR calc non Af Amer: >60 mL/min (ref >60)
Glucose, Bld: 108 mg/dL — ABNORMAL HIGH (ref 70–99)
Potassium: 3.5 mmol/L (ref 3.5–5.1)
Sodium: 142 mmol/L (ref 135–145)

## 2019-08-11 LAB — CBC
HCT: 34 % — ABNORMAL LOW (ref 36.0–46.0)
Hemoglobin: 10.5 g/dL — ABNORMAL LOW (ref 12.0–15.0)
MCH: 26.2 pg (ref 26.0–34.0)
MCHC: 30.9 g/dL (ref 30.0–36.0)
MCV: 84.8 fL (ref 80.0–100.0)
Platelets: 230 10*3/uL (ref 150–400)
RBC: 4.01 MIL/uL (ref 3.87–5.11)
RDW: 16.4 % — ABNORMAL HIGH (ref 11.5–15.5)
WBC: 7.1 10*3/uL (ref 4.0–10.5)
nRBC: 0 % (ref 0.0–0.2)

## 2019-08-11 MED ORDER — POTASSIUM CHLORIDE CRYS ER 20 MEQ PO TBCR
40.0000 meq | EXTENDED_RELEASE_TABLET | Freq: Once | ORAL | Status: AC
  Administered 2019-08-11: 40 meq via ORAL
  Filled 2019-08-11: qty 2

## 2019-08-11 MED ORDER — SIMVASTATIN 20 MG PO TABS: 10.0000 mg | ORAL_TABLET | Freq: Every day | ORAL | 0 refills | Status: DC

## 2019-08-11 NOTE — Discharge Summary (Signed)
[facility] NAME: Molly [name]    MR#:  [id number]  DATE OF BIRTH:  [birth year]  DATE OF ADMISSION:  08/09/2019 ADMITTING PHYSICIAN: Otila Back, MD  DATE OF DISCHARGE: 08/11/2019  PRIMARY CARE PHYSICIAN: Idelle Crouch, MD   ADMISSION DIAGNOSIS:  Hypoxia [R09.02] Acute pancreatitis, unspecified complication status, unspecified pancreatitis type [K85.90]  DISCHARGE DIAGNOSIS:  Active Problems:   Acute pancreatitis Leukocytosis Abdominal pain Hypertension Type 2 diabetes mellitus Hypoxia  SECONDARY DIAGNOSIS:   Past Medical History:  Diagnosis Date  . Arrhythmia   . Arthritis   . Cancer ([place])    skin  . CMV (cytomegalovirus infection) ([place])   . Colon polyps   . Diabetes mellitus, type 2 ([place])   . Duodenitis   . Dysrhythmia   . Fatty liver disease, nonalcoholic   . FH: colonic polyps    hx of-adenomatous  . GERD (gastroesophageal reflux disease)   . Heart murmur    dx'd in 1960s. followed by PCP  . Hemorrhoid .  [place] Kitchen Hyperlipidemia   . Hypertension   . IBS (irritable bowel syndrome)   . Osteoporosis   . Osteoporosis   . Pancreatitis    drug induced     ADMITTING HISTORY Molly [name]  is a 78 y.o. female with a known history of diabetes mellitus type 2, prior history of pancreatitis and nonalcoholic fatty liver disease who presented today emergency room with complaints of abdominal pains, nausea and vomiting that started today.  Abdominal pain is located in the epigastric region.  Denies any fevers.  No chest pain.  Patient was evaluated in the emergency room and noted to have had a transient episode of decreased oxygen saturation to 85%.  2 L of oxygen was applied.  Laboratory studies revealed elevated lipase of 897.  Mild leukocytosis with white count of 12.3.  CT scan of the abdomen and pelvis with contrast done revealed mild acute pancreatitis.  No evidence of pancreatic necrosis.  Gallbladder exam was unremarkable.  No  evidence of biliary ductal dilatation.  Patient diagnosed with acute pancreatitis.  Medical service called to admit patient for further evaluation and management.  HOSPITAL COURSE:  Patient was admitted for pancreatitis with elevated lipase level.  Initially she was kept n.p.o. given IV fluids pain was controlled with Dilaudid.  Lipase levels came down she.  Was worked up with CT abdomen.  Hypoxia also improved and leukocytosis completely resolved.  Patient tolerated liquid and solid diet well.  Her abdominal pain completely resolved.  She will be discharged home.  CONSULTS OBTAINED:    DRUG ALLERGIES:   Allergies  Allergen Reactions  . Alendronate Nausea Only  . Codeine Nausea Only  . Lansoprazole     REACTION: hurts stomach  . Sulfonamide Derivatives     Childhood reaction (doesn't remember)  . Adhesive [Tape] Rash    Blisters if on too long    DISCHARGE MEDICATIONS:   Allergies as of 08/11/2019      Reactions   Alendronate Nausea Only   Codeine Nausea Only   Lansoprazole    REACTION: hurts stomach   Sulfonamide Derivatives    Childhood reaction (doesn't remember)   Adhesive [tape] Rash   Blisters if on too long      Medication List    TAKE these medications   ACCU-CHEK COMPACT STRIPS test strip Generic drug: glucose blood 1 each by Other route as directed. Use as instructed   accu-chek softclix lancets 1  each by Other route as needed. Use as instructed   acetaminophen 325 MG tablet Commonly known as: TYLENOL Take 2 tablets (650 mg total) by mouth every 6 (six) hours as needed for mild pain or moderate pain (or Fever >/= 101).   Biotin 2500 MCG Caps Take by mouth 2 (two) times daily.   CALCIUM PO Take 750 mg by mouth 2 (two) times daily.   glipiZIDE 10 MG 24 hr tablet Commonly known as: GLUCOTROL XL Take 10 mg by mouth 2 (two) times daily.   Lantus SoloStar 100 UNIT/ML Solostar Pen Generic drug: Insulin Glargine Inject 38 Units into the skin every  morning.   lisinopril 5 MG tablet Commonly known as: ZESTRIL Take 5 mg by mouth daily.   metFORMIN 500 MG 24 hr tablet Commonly known as: GLUCOPHAGE-XR Take 1,000 mg by mouth 2 (two) times daily.   metoprolol tartrate 50 MG tablet Commonly known as: LOPRESSOR Take 50 mg by mouth 2 (two) times daily.   Multivital tablet Take 1 tablet by mouth daily.   omeprazole 20 MG capsule Commonly known as: PRILOSEC TAKE ONE CAPSULE BY MOUTH EVERY DAY   polyethylene glycol 17 g packet Commonly known as: MIRALAX / GLYCOLAX Take 17 g by mouth daily as needed.   potassium chloride 20 MEQ packet Commonly known as: KLOR-CON Take by mouth 2 (two) times daily.   simvastatin 20 MG tablet Commonly known as: ZOCOR Take 0.5 tablets (10 mg total) by mouth at bedtime. What changed: medication strength   VITAMIN D PO Take 500 Units by mouth 2 (two) times daily.       Today  Patient seen and evaluated today No abdominal pain Tolerated diet well Hemodynamically stable VITAL SIGNS:  Blood pressure (!) 127/52, pulse (!) 58, temperature 98 F (36.7 C), temperature source Oral, resp. rate 14, height 5\' 5"  (1.651 m), weight 82.4 kg, SpO2 98 %.  I/O:    Intake/Output Summary (Last 24 hours) at 08/11/2019 1228 Last data filed at 08/11/2019 1158 Gross per 24 hour  Intake 5413.48 ml  Output 1300 ml  Net 4113.48 ml    PHYSICAL EXAMINATION:  Physical Exam  GENERAL:  78 y.o.-year-old patient lying in the bed with no acute distress.  LUNGS: Normal breath sounds bilaterally, no wheezing, rales,rhonchi or crepitation. No use of accessory muscles of respiration.  CARDIOVASCULAR: S1, S2 normal. No murmurs, rubs, or gallops.  ABDOMEN: Soft, non-tender, non-distended. Bowel sounds present. No organomegaly or mass.  NEUROLOGIC: Moves all 4 extremities. PSYCHIATRIC: The patient is alert and oriented x 3.  SKIN: No obvious rash, lesion, or ulcer.   DATA REVIEW:   CBC Recent Labs  Lab  08/11/19 0326  WBC 7.1  HGB 10.5*  HCT 34.0*  PLT 230    Chemistries  Recent Labs  Lab 08/09/19 0944 08/10/19 0441 08/11/19 0326  NA 136  --  142  K 4.2  --  3.5  CL 101  --  111  CO2 20*  --  25  GLUCOSE 257*  --  108*  BUN 16  --  6*  CREATININE 0.63  --  0.46  CALCIUM 9.5  --  7.8*  MG  --  1.8  --   AST 26  --   --   ALT 16  --   --   ALKPHOS 37*  --   --   BILITOT 0.7  --   --     Cardiac Enzymes No results for input(s): TROPONINI in the  last 168 hours.  Microbiology Results  Results for orders placed or performed during the hospital encounter of 08/09/19  SARS CORONAVIRUS 2 (TAT 6-24 HRS) Nasopharyngeal Nasopharyngeal Swab     Status: None   Collection Time: 08/09/19  2:24 PM   Specimen: Nasopharyngeal Swab  Result Value Ref Range Status   SARS Coronavirus 2 NEGATIVE NEGATIVE Final    Comment: (NOTE) SARS-CoV-2 target nucleic acids are NOT DETECTED. The SARS-CoV-2 RNA is generally detectable in upper and lower respiratory specimens during the acute phase of infection. Negative results do not preclude SARS-CoV-2 infection, do not rule out co-infections with other pathogens, and should not be used as the sole basis for treatment or other patient management decisions. Negative results must be combined with clinical observations, patient history, and epidemiological information. The expected result is Negative. Fact Sheet for Patients: SugarRoll.be Fact Sheet for Healthcare Providers: https://www.woods-mathews.com/ This test is not yet approved or cleared by the [place] FDA and  has been authorized for detection and/or diagnosis of SARS-CoV-2 by FDA under an Emergency Use Authorization (EUA). This EUA will remain  in effect (meaning this test can be used) for the duration of the COVID-19 declaration under Section 56 4(b)(1) of the Act, 21 U.S.C. section 360bbb-3(b)(1), unless the authorization is terminated  or revoked sooner. Performed at [facility], [place] [address] [place], [place] [address]     RADIOLOGY:  Dg Chest 1 View  Result Date: 08/09/2019 CLINICAL DATA:  Hypoxia EXAM: CHEST  1 VIEW COMPARISON:  None. FINDINGS: Heart is normal size. Mitral valve annular calcifications noted. No confluent opacities or effusions. No acute bony abnormality. IMPRESSION: No active disease. Electronically Signed   By: Rolm Baptise M.D.   On: 08/09/2019 16:27   Ct Abdomen Pelvis W Contrast  Result Date: 08/09/2019 CLINICAL DATA:  Abdominal pain, nausea, and diarrhea beginning this morning. Previous pancreatitis. EXAM: CT ABDOMEN AND PELVIS WITH CONTRAST TECHNIQUE: Multidetector CT imaging of the abdomen and pelvis was performed using the standard protocol following bolus administration of intravenous contrast. CONTRAST:  148mL OMNIPAQUE IOHEXOL 300 MG/ML  SOLN COMPARISON:  03/18/2018 FINDINGS: Lower Chest: No acute findings. Hepatobiliary: No hepatic masses identified. Gallbladder is unremarkable. No evidence of biliary ductal dilatation. Pancreas: Mild peripancreatic inflammatory changes are seen, consistent with acute pancreatitis. No evidence of pancreatic mass or necrosis. No evidence of pancreatic ductal dilatation or pseudocysts. Spleen: Within normal limits in size and appearance. Adrenals/Urinary Tract: No masses identified. Stable small left renal cysts. No evidence of hydronephrosis. Stomach/Bowel: No evidence of obstruction, inflammatory process or abnormal fluid collections. Normal appendix visualized. Vascular/Lymphatic: No pathologically enlarged lymph nodes. No abdominal aortic aneurysm. Aortic atherosclerosis. Reproductive: Prior hysterectomy noted. Adnexal regions are unremarkable in appearance. Other:  None. Musculoskeletal:  No suspicious bone lesions identified. IMPRESSION: Mild acute pancreatitis. No evidence of pancreatic necrosis, pseudocyst, or other complication. Aortic Atherosclerosis  (ICD10-I70.0). Electronically Signed   By: Marlaine Hind M.D.   On: 08/09/2019 14:22    Follow up with PCP in 1 week.  Management plans discussed with the patient, family and they are in agreement.  CODE STATUS: Full code    Code Status Orders  (From admission, onward)         Start     Ordered   08/09/19 1552  Full code  Continuous     08/09/19 1551        Code Status History    Date Active Date Inactive Code Status Order ID Comments User Context  03/18/2018 1746 03/22/2018 1611 Full Code [id number]  Saundra Shelling, MD Inpatient   Advance Care Planning Activity    Advance Directive Documentation     Most Recent Value  Type of Advance Directive  Healthcare Power of Attorney, Living will  Pre-existing out of facility DNR order (yellow form or pink MOST form)  -  "MOST" Form in Place?  -      TOTAL TIME TAKING CARE OF THIS PATIENT ON DAY OF DISCHARGE: more than 35 minutes.   Saundra Shelling M.D on 08/11/2019 at 12:28 PM  Between 7am to 6pm - Pager - [phone]  After 6pm go to [email or web address] - password [email or web address] [facility]  Office  [phone]  CC: Primary care physician; Idelle Crouch, MD  Note: This dictation was prepared with Dragon dictation along with smaller phrase technology. Any transcriptional errors that result from this process are unintentional.

## 2019-08-11 NOTE — Care Management Important Message (Signed)
Important Message  Patient Details  Name: OGECHUKWU TESSMANN MRN: [id number] Date of Birth: 1941-11-09   Medicare Important Message Given:  Yes  Initial Medicare IM given by Patient Access Associate on 08/09/2019 at 4:25pm.     Dannette Barbara 08/11/2019, 12:04 PM

## 2019-08-11 NOTE — Discharge Instructions (Signed)
Acute Pancreatitis  Acute pancreatitis happens when the pancreas gets swollen. The pancreas is a large gland in the body that helps to control blood sugar. It also makes enzymes that help to digest food. This condition can last a few days and cause serious problems. The lungs, heart, and kidneys may stop working. What are the causes? Causes include:  Alcohol abuse.  Drug abuse.  Gallstones.  A tumor in the pancreas. Other causes include:  Some medicines.  Some chemicals.  Diabetes.  An infection.  Damage caused by an accident.  The poison (venom) from a scorpion bite.  Belly (abdominal) surgery.  The body's defense system (immune system) attacking the pancreas (autoimmune pancreatitis).  Genes that are passed from parent to child (inherited). In some cases, the cause is not known. What are the signs or symptoms?  Pain in the upper belly that may be felt in the back. The pain may be very bad.  Swelling of the belly.  Feeling sick to your stomach (nauseous) and throwing up (vomiting).  Fever. How is this treated? You will likely have to stay in the hospital. Treatment may include:  Pain medicine.  Fluid through an IV tube.  Placing a tube in the stomach to take out the stomach contents. This may help you stop throwing up.  Not eating for 3-4 days.  Antibiotic medicines, if you have an infection.  Treating any other problems that may be the cause.  Steroid medicines, if your problem is caused by your defense system attacking your body's own tissues.  Surgery. Follow these instructions at home: Eating and drinking   Follow instructions from your doctor about what to eat and drink.  Eat foods that do not have a lot of fat in them.  Eat small meals often. Do not eat big meals.  Drink enough fluid to keep your pee (urine) pale yellow.  Do not drink alcohol if it caused your condition. Medicines  Take over-the-counter and prescription medicines only  as told by your doctor.  Ask your doctor if the medicine prescribed to you: ? Requires you to avoid driving or using heavy machinery. ? Can cause trouble pooping (constipation). You may need to take steps to prevent or treat trouble pooping:  Take over-the-counter or prescription medicines.  Eat foods that are high in fiber. These include beans, whole grains, and fresh fruits and vegetables.  Limit foods that are high in fat and sugar. These include fried or sweet foods. General instructions  Do not use any products that contain nicotine or tobacco, such as cigarettes, e-cigarettes, and chewing tobacco. If you need help quitting, ask your doctor.  Get plenty of rest.  Check your blood sugar at home as told by your doctor.  Keep all follow-up visits as told by your doctor. This is important. Contact a doctor if:  You do not get better as quickly as expected.  You have new symptoms.  Your symptoms get worse.  You have pain or weakness that lasts a long time.  You keep feeling sick to your stomach.  You get better and then you have pain again.  You have a fever. Get help right away if:  You cannot eat or keep fluids down.  Your pain gets very bad.  Your skin or the white part of your eyes turns yellow.  You have sudden swelling in your belly.  You throw up.  You feel dizzy or you pass out (faint).  Your blood sugar is high (over 300  mg/dL). °Summary °· Acute pancreatitis happens when the pancreas gets swollen. °· This condition is often caused by alcohol abuse, drug abuse, or gallstones. °· You will likely have to stay in the hospital for treatment. °This information is not intended to replace advice given to you by your health care provider. Make sure you discuss any questions you have with your health care provider. °Document Released: 04/14/2008 Document Revised: 08/16/2018 Document Reviewed: 08/16/2018 °Elsevier Patient Education © 2020 Elsevier Inc. ° °

## 2019-08-11 NOTE — Progress Notes (Signed)
Molly [name] to be D/C'd Home per MD order.  Discussed prescriptions and follow up appointments with the patient. Prescriptions given to patient, medication list explained in detail. Pt verbalized understanding.  Allergies as of 08/11/2019      Reactions   Alendronate Nausea Only   Codeine Nausea Only   Lansoprazole    REACTION: hurts stomach   Sulfonamide Derivatives    Childhood reaction (doesn't remember)   Adhesive [tape] Rash   Blisters if on too long      Medication List    TAKE these medications   ACCU-CHEK COMPACT STRIPS test strip Generic drug: glucose blood 1 each by Other route as directed. Use as instructed   accu-chek softclix lancets 1 each by Other route as needed. Use as instructed   acetaminophen 325 MG tablet Commonly known as: TYLENOL Take 2 tablets (650 mg total) by mouth every 6 (six) hours as needed for mild pain or moderate pain (or Fever >/= 101).   Biotin 2500 MCG Caps Take by mouth 2 (two) times daily.   CALCIUM PO Take 750 mg by mouth 2 (two) times daily.   glipiZIDE 10 MG 24 hr tablet Commonly known as: GLUCOTROL XL Take 10 mg by mouth 2 (two) times daily.   Lantus SoloStar 100 UNIT/ML Solostar Pen Generic drug: Insulin Glargine Inject 38 Units into the skin every morning.   lisinopril 5 MG tablet Commonly known as: ZESTRIL Take 5 mg by mouth daily.   metFORMIN 500 MG 24 hr tablet Commonly known as: GLUCOPHAGE-XR Take 1,000 mg by mouth 2 (two) times daily.   metoprolol tartrate 50 MG tablet Commonly known as: LOPRESSOR Take 50 mg by mouth 2 (two) times daily.   Multivital tablet Take 1 tablet by mouth daily.   omeprazole 20 MG capsule Commonly known as: PRILOSEC TAKE ONE CAPSULE BY MOUTH EVERY DAY   polyethylene glycol 17 g packet Commonly known as: MIRALAX / GLYCOLAX Take 17 g by mouth daily as needed.   potassium chloride 20 MEQ packet Commonly known as: KLOR-CON Take by mouth 2 (two) times daily.   simvastatin 20 MG  tablet Commonly known as: ZOCOR Take 0.5 tablets (10 mg total) by mouth at bedtime. What changed: medication strength   VITAMIN D PO Take 500 Units by mouth 2 (two) times daily.       Vitals:   08/10/19 1958 08/11/19 0419  BP: (!) 147/66 (!) 127/52  Pulse: 74 (!) 58  Resp: 16 14  Temp: 97.7 F (36.5 C) 98 F (36.7 C)  SpO2: 98% 98%    Skin clean, dry and intact without evidence of skin break down, no evidence of skin tears noted. IV catheter discontinued intact. Site without signs and symptoms of complications. Dressing and pressure applied. Pt denies pain at this time. No complaints noted.  An After Visit Summary was printed and given to the patient. Patient escorted via [place], and D/C home via private auto.  Fuller Mandril, RN

## 2019-08-23 ENCOUNTER — Other Ambulatory Visit: Payer: Self-pay | Admitting: Internal Medicine

## 2019-08-23 DIAGNOSIS — Z1231 Encounter for screening mammogram for malignant neoplasm of breast: Secondary | ICD-10-CM

## 2019-09-27 ENCOUNTER — Other Ambulatory Visit: Payer: Self-pay | Admitting: Internal Medicine

## 2019-09-27 DIAGNOSIS — Z8719 Personal history of other diseases of the digestive system: Secondary | ICD-10-CM

## 2019-09-29 ENCOUNTER — Ambulatory Visit
Admission: RE | Admit: 2019-09-29 | Discharge: 2019-09-29 | Disposition: A | Discharge status: Home / Self Care | Payer: Medicare HMO | Source: Ambulatory Visit | Attending: Internal Medicine | Admitting: Internal Medicine

## 2019-09-29 DIAGNOSIS — Z1231 Encounter for screening mammogram for malignant neoplasm of breast: Secondary | ICD-10-CM | POA: Diagnosis not present

## 2019-10-04 ENCOUNTER — Ambulatory Visit
Admission: RE | Admit: 2019-10-04 | Discharge: 2019-10-04 | Disposition: A | Discharge status: Home / Self Care | Payer: Medicare HMO | Source: Ambulatory Visit | Attending: Internal Medicine | Admitting: Internal Medicine

## 2019-10-04 ENCOUNTER — Other Ambulatory Visit: Payer: Self-pay

## 2019-10-04 DIAGNOSIS — Z8719 Personal history of other diseases of the digestive system: Secondary | ICD-10-CM | POA: Diagnosis not present

## 2019-11-24 IMAGING — MG DIGITAL SCREENING BILAT W/ TOMO W/ CAD
6 of 10 series · 6 of 30 positions shown · non-contrast
Comparison: Previous exam(s).

CLINICAL DATA: Screening.

EXAM:
DIGITAL SCREENING BILATERAL MAMMOGRAM WITH TOMO AND CAD

[R CC synth-2D]
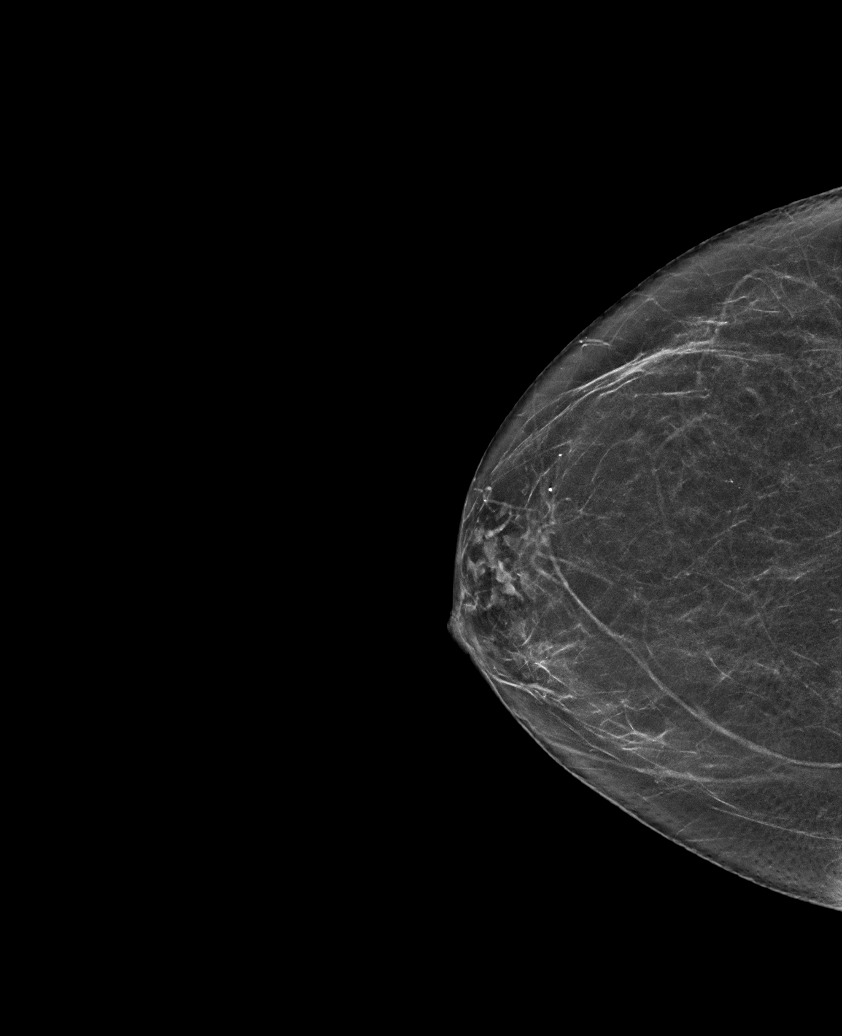

[R MLO synth-2D]
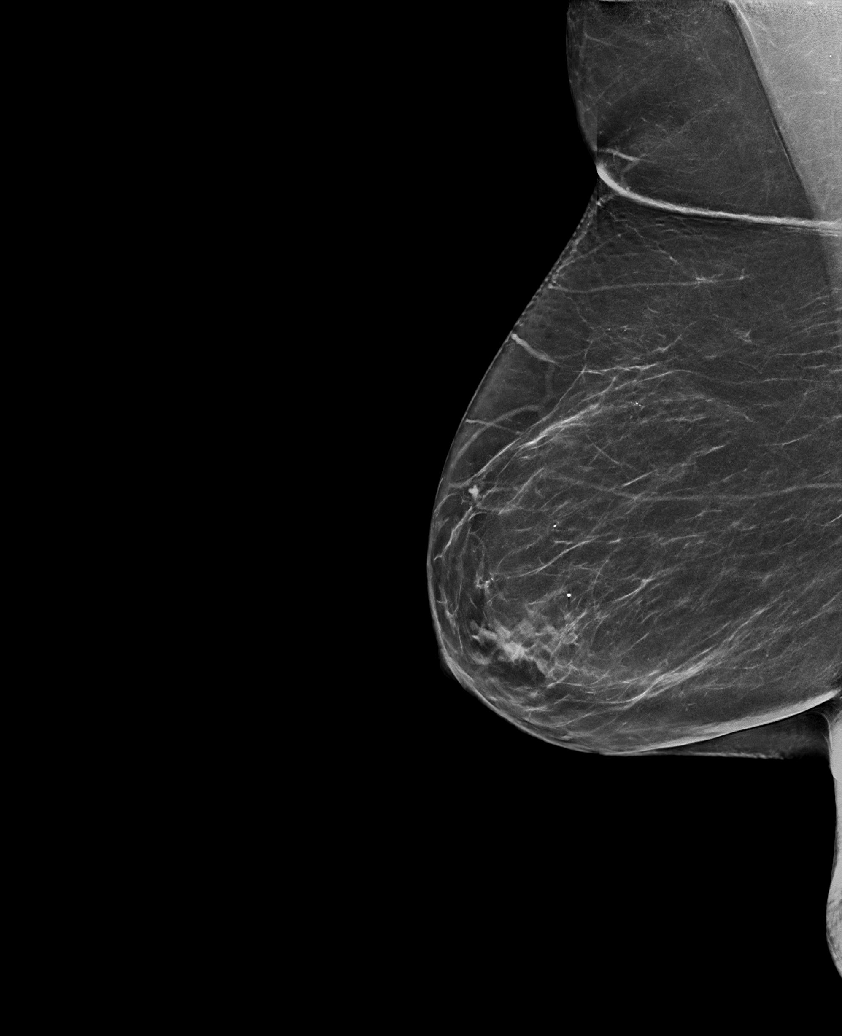

[L CC synth-2D]
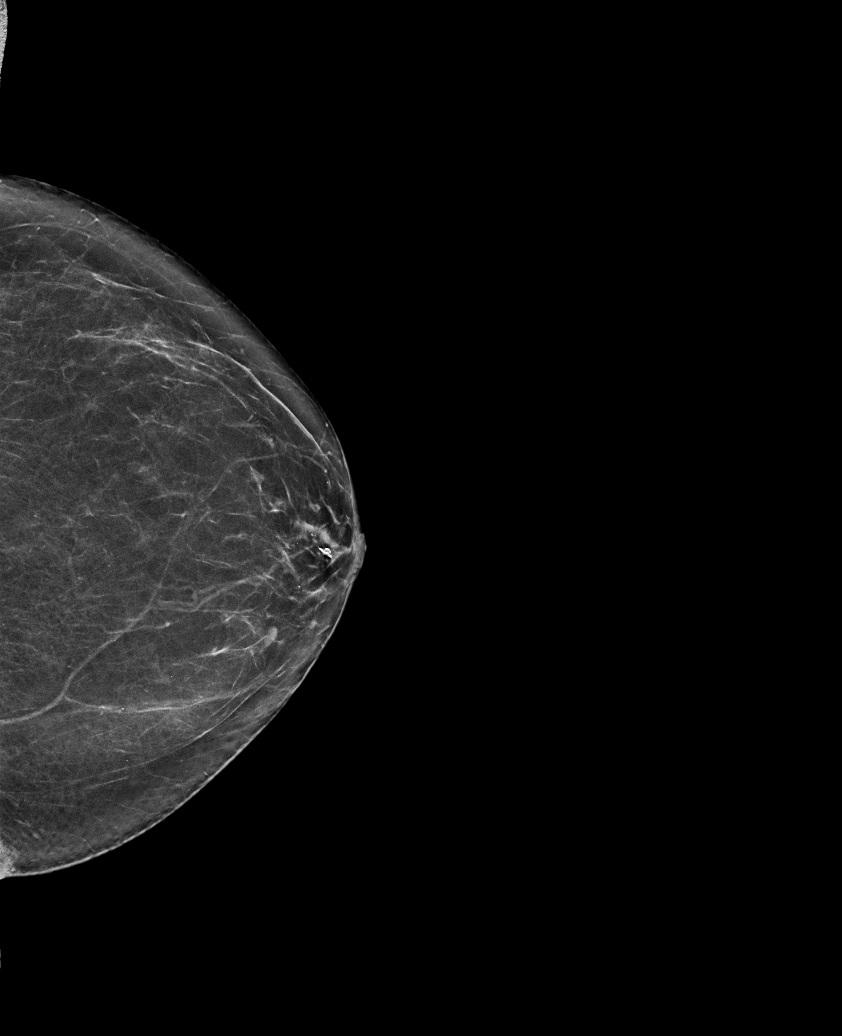

[L MLO synth-2D (1 of 2)]
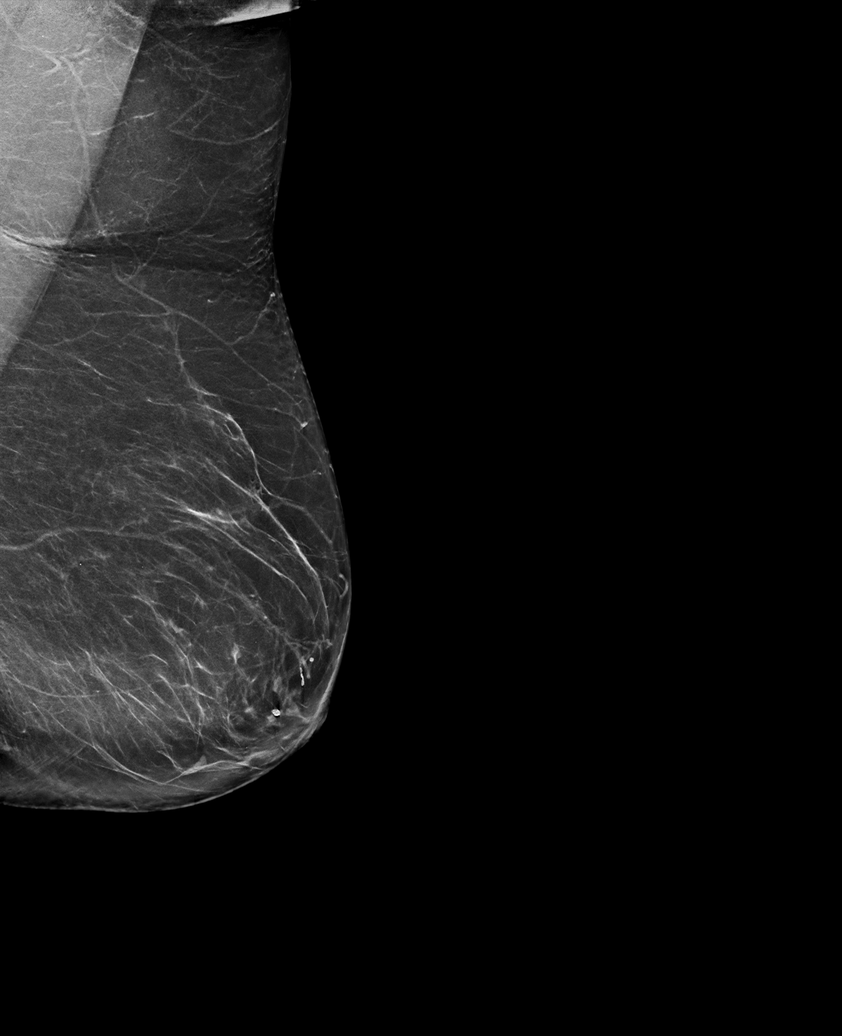

[L MLO synth-2D (2 of 2)]
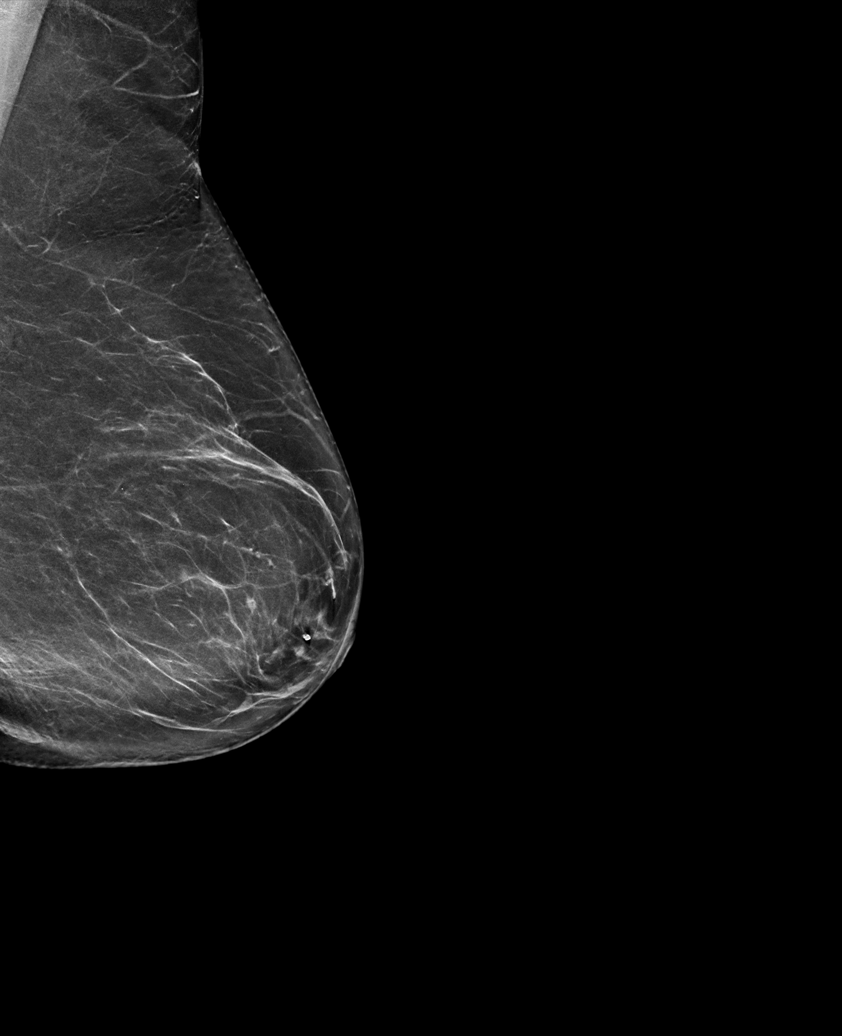

[L MLO tomo · tomo slice 37/74.0]
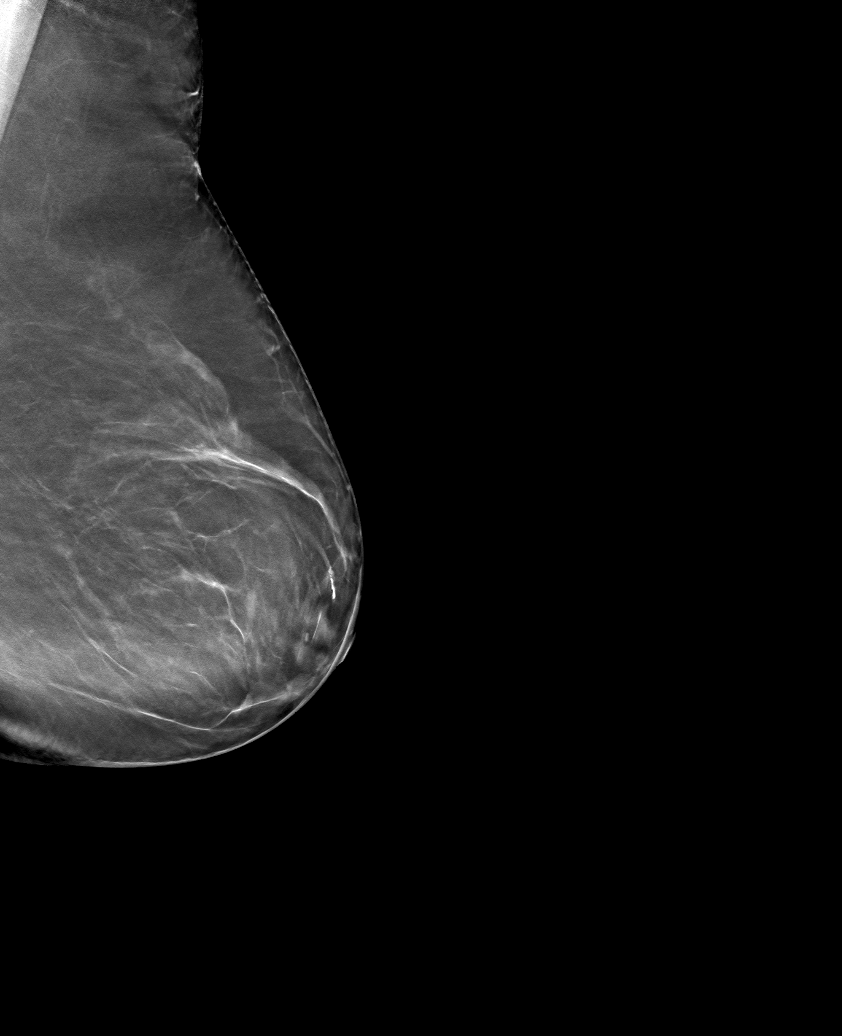

[6 of 30 positions shown; findings below may reference images not displayed]

ACR Breast Density Category b: There are scattered areas of
fibroglandular density.
FINDINGS: There are no findings suspicious for malignancy. Images were
processed with CAD.
IMPRESSION: No mammographic evidence of malignancy. A result letter of this
screening mammogram will be mailed directly to the patient.

RECOMMENDATION:
Screening mammogram in one year. (Code:[id number])

BI-RADS CATEGORY  1: Negative.

## 2020-08-23 ENCOUNTER — Other Ambulatory Visit: Payer: Self-pay | Admitting: Internal Medicine

## 2020-08-23 DIAGNOSIS — R1010 Upper abdominal pain, unspecified: Secondary | ICD-10-CM

## 2020-08-24 ENCOUNTER — Ambulatory Visit
Admission: RE | Admit: 2020-08-24 | Discharge: 2020-08-24 | Disposition: A | Discharge status: Home / Self Care | Payer: Medicare HMO | Source: Ambulatory Visit | Attending: Internal Medicine | Admitting: Internal Medicine

## 2020-08-24 ENCOUNTER — Other Ambulatory Visit: Payer: Self-pay

## 2020-08-24 DIAGNOSIS — R1010 Upper abdominal pain, unspecified: Secondary | ICD-10-CM

## 2020-11-12 ENCOUNTER — Other Ambulatory Visit: Payer: Self-pay | Admitting: Internal Medicine

## 2020-11-12 DIAGNOSIS — Z1231 Encounter for screening mammogram for malignant neoplasm of breast: Secondary | ICD-10-CM

## 2020-11-23 DIAGNOSIS — D485 Neoplasm of uncertain behavior of skin: Secondary | ICD-10-CM | POA: Diagnosis not present

## 2020-11-23 DIAGNOSIS — Z85828 Personal history of other malignant neoplasm of skin: Secondary | ICD-10-CM | POA: Diagnosis not present

## 2020-11-23 DIAGNOSIS — L821 Other seborrheic keratosis: Secondary | ICD-10-CM | POA: Diagnosis not present

## 2020-11-23 DIAGNOSIS — D0439 Carcinoma in situ of skin of other parts of face: Secondary | ICD-10-CM | POA: Diagnosis not present

## 2020-11-23 DIAGNOSIS — D2262 Melanocytic nevi of left upper limb, including shoulder: Secondary | ICD-10-CM | POA: Diagnosis not present

## 2020-11-23 DIAGNOSIS — D2261 Melanocytic nevi of right upper limb, including shoulder: Secondary | ICD-10-CM | POA: Diagnosis not present

## 2020-11-23 DIAGNOSIS — D225 Melanocytic nevi of trunk: Secondary | ICD-10-CM | POA: Diagnosis not present

## 2020-11-23 DIAGNOSIS — X32XXXA Exposure to sunlight, initial encounter: Secondary | ICD-10-CM | POA: Diagnosis not present

## 2020-11-23 DIAGNOSIS — L570 Actinic keratosis: Secondary | ICD-10-CM | POA: Diagnosis not present

## 2020-12-05 ENCOUNTER — Ambulatory Visit
Admission: RE | Admit: 2020-12-05 | Discharge: 2020-12-05 | Disposition: A | Discharge status: Home / Self Care | Payer: HMO | Source: Ambulatory Visit | Attending: Internal Medicine | Admitting: Internal Medicine

## 2020-12-05 ENCOUNTER — Other Ambulatory Visit: Payer: Self-pay

## 2020-12-05 DIAGNOSIS — Z1231 Encounter for screening mammogram for malignant neoplasm of breast: Secondary | ICD-10-CM | POA: Insufficient documentation

## 2020-12-21 DIAGNOSIS — E113293 Type 2 diabetes mellitus with mild nonproliferative diabetic retinopathy without macular edema, bilateral: Secondary | ICD-10-CM | POA: Diagnosis not present

## 2020-12-28 DIAGNOSIS — R5383 Other fatigue: Secondary | ICD-10-CM | POA: Diagnosis not present

## 2020-12-28 DIAGNOSIS — Z794 Long term (current) use of insulin: Secondary | ICD-10-CM | POA: Diagnosis not present

## 2020-12-28 DIAGNOSIS — E118 Type 2 diabetes mellitus with unspecified complications: Secondary | ICD-10-CM | POA: Diagnosis not present

## 2020-12-28 DIAGNOSIS — E7800 Pure hypercholesterolemia, unspecified: Secondary | ICD-10-CM | POA: Diagnosis not present

## 2020-12-28 DIAGNOSIS — R5381 Other malaise: Secondary | ICD-10-CM | POA: Diagnosis not present

## 2020-12-28 DIAGNOSIS — R809 Proteinuria, unspecified: Secondary | ICD-10-CM | POA: Diagnosis not present

## 2020-12-28 DIAGNOSIS — I10 Essential (primary) hypertension: Secondary | ICD-10-CM | POA: Diagnosis not present

## 2020-12-28 DIAGNOSIS — Z0000 Encounter for general adult medical examination without abnormal findings: Secondary | ICD-10-CM | POA: Diagnosis not present

## 2020-12-28 DIAGNOSIS — M810 Age-related osteoporosis without current pathological fracture: Secondary | ICD-10-CM | POA: Diagnosis not present

## 2020-12-28 DIAGNOSIS — R002 Palpitations: Secondary | ICD-10-CM | POA: Diagnosis not present

## 2020-12-28 DIAGNOSIS — Z79899 Other long term (current) drug therapy: Secondary | ICD-10-CM | POA: Diagnosis not present

## 2020-12-28 DIAGNOSIS — E1129 Type 2 diabetes mellitus with other diabetic kidney complication: Secondary | ICD-10-CM | POA: Diagnosis not present

## 2021-01-25 ENCOUNTER — Emergency Department: Payer: HMO

## 2021-01-25 ENCOUNTER — Other Ambulatory Visit: Payer: Self-pay

## 2021-01-25 ENCOUNTER — Inpatient Hospital Stay
Admission: EM | Admit: 2021-01-25 | Discharge: 2021-01-30 | DRG: 438 | Disposition: A | Discharge status: Home Health | Payer: HMO | Attending: Internal Medicine | Admitting: Internal Medicine

## 2021-01-25 DIAGNOSIS — E872 Acidosis, unspecified: Secondary | ICD-10-CM

## 2021-01-25 DIAGNOSIS — E785 Hyperlipidemia, unspecified: Secondary | ICD-10-CM | POA: Diagnosis present

## 2021-01-25 DIAGNOSIS — I10 Essential (primary) hypertension: Secondary | ICD-10-CM

## 2021-01-25 DIAGNOSIS — K8500 Idiopathic acute pancreatitis without necrosis or infection: Principal | ICD-10-CM | POA: Diagnosis present

## 2021-01-25 DIAGNOSIS — J811 Chronic pulmonary edema: Secondary | ICD-10-CM | POA: Diagnosis not present

## 2021-01-25 DIAGNOSIS — IMO0002 Reserved for concepts with insufficient information to code with codable children: Secondary | ICD-10-CM

## 2021-01-25 DIAGNOSIS — K859 Acute pancreatitis without necrosis or infection, unspecified: Secondary | ICD-10-CM | POA: Diagnosis present

## 2021-01-25 DIAGNOSIS — E876 Hypokalemia: Secondary | ICD-10-CM | POA: Diagnosis not present

## 2021-01-25 DIAGNOSIS — Z79899 Other long term (current) drug therapy: Secondary | ICD-10-CM | POA: Diagnosis not present

## 2021-01-25 DIAGNOSIS — R0602 Shortness of breath: Secondary | ICD-10-CM | POA: Diagnosis not present

## 2021-01-25 DIAGNOSIS — D72829 Elevated white blood cell count, unspecified: Secondary | ICD-10-CM | POA: Diagnosis not present

## 2021-01-25 DIAGNOSIS — Z8249 Family history of ischemic heart disease and other diseases of the circulatory system: Secondary | ICD-10-CM

## 2021-01-25 DIAGNOSIS — R739 Hyperglycemia, unspecified: Secondary | ICD-10-CM | POA: Diagnosis not present

## 2021-01-25 DIAGNOSIS — Z833 Family history of diabetes mellitus: Secondary | ICD-10-CM | POA: Diagnosis not present

## 2021-01-25 DIAGNOSIS — Z7984 Long term (current) use of oral hypoglycemic drugs: Secondary | ICD-10-CM | POA: Diagnosis not present

## 2021-01-25 DIAGNOSIS — K219 Gastro-esophageal reflux disease without esophagitis: Secondary | ICD-10-CM | POA: Diagnosis present

## 2021-01-25 DIAGNOSIS — J9601 Acute respiratory failure with hypoxia: Secondary | ICD-10-CM

## 2021-01-25 DIAGNOSIS — Z20822 Contact with and (suspected) exposure to covid-19: Secondary | ICD-10-CM | POA: Diagnosis present

## 2021-01-25 DIAGNOSIS — E119 Type 2 diabetes mellitus without complications: Secondary | ICD-10-CM | POA: Diagnosis present

## 2021-01-25 DIAGNOSIS — Z794 Long term (current) use of insulin: Secondary | ICD-10-CM

## 2021-01-25 DIAGNOSIS — K5900 Constipation, unspecified: Secondary | ICD-10-CM | POA: Diagnosis present

## 2021-01-25 DIAGNOSIS — R7989 Other specified abnormal findings of blood chemistry: Secondary | ICD-10-CM | POA: Diagnosis present

## 2021-01-25 DIAGNOSIS — R1110 Vomiting, unspecified: Secondary | ICD-10-CM | POA: Diagnosis not present

## 2021-01-25 DIAGNOSIS — R109 Unspecified abdominal pain: Secondary | ICD-10-CM | POA: Diagnosis not present

## 2021-01-25 DIAGNOSIS — K760 Fatty (change of) liver, not elsewhere classified: Secondary | ICD-10-CM | POA: Diagnosis present

## 2021-01-25 DIAGNOSIS — I517 Cardiomegaly: Secondary | ICD-10-CM | POA: Diagnosis not present

## 2021-01-25 DIAGNOSIS — R1084 Generalized abdominal pain: Secondary | ICD-10-CM | POA: Diagnosis not present

## 2021-01-25 LAB — URINALYSIS, COMPLETE (UACMP) WITH MICROSCOPIC
Bacteria, UA: NONE SEEN
Bilirubin Urine: NEGATIVE
Glucose, UA: 150 mg/dL — AB
Hgb urine dipstick: NEGATIVE
Ketones, ur: 5 mg/dL — AB
Nitrite: NEGATIVE
Protein, ur: NEGATIVE mg/dL
Specific Gravity, Urine: 1.013 (ref 1.005–1.030)
pH: 5 (ref 5.0–8.0)

## 2021-01-25 LAB — GLUCOSE, CAPILLARY
Glucose-Capillary: 129 mg/dL — ABNORMAL HIGH (ref 70–99)
Glucose-Capillary: 124 mg/dL — ABNORMAL HIGH (ref 70–99)

## 2021-01-25 LAB — CBC
HCT: 37.3 % (ref 36.0–46.0)
Hemoglobin: 12.1 g/dL (ref 12.0–15.0)
MCH: 26.4 pg (ref 26.0–34.0)
MCHC: 32.4 g/dL (ref 30.0–36.0)
MCV: 81.4 fL (ref 80.0–100.0)
Platelets: 244 10*3/uL (ref 150–400)
RBC: 4.58 MIL/uL (ref 3.87–5.11)
RDW: 17.3 % — ABNORMAL HIGH (ref 11.5–15.5)
WBC: 12.2 10*3/uL — ABNORMAL HIGH (ref 4.0–10.5)
nRBC: 0 % (ref 0.0–0.2)

## 2021-01-25 LAB — LIPID PANEL
Cholesterol: 148 mg/dL (ref 0–200)
HDL: 56 mg/dL (ref >40)
LDL Cholesterol: 78 mg/dL (ref 0–99)
Total CHOL/HDL Ratio: 2.6 RATIO
Triglycerides: 70 mg/dL (ref <150)
VLDL: 14 mg/dL (ref 0–40)

## 2021-01-25 LAB — HEMOGLOBIN A1C
Hgb A1c MFr Bld: 8.1 % — ABNORMAL HIGH (ref 4.8–5.6)
Mean Plasma Glucose: 185.77 mg/dL

## 2021-01-25 LAB — CBC WITH DIFFERENTIAL/PLATELET
Abs Immature Granulocytes: 0.09 10*3/uL — ABNORMAL HIGH (ref 0.00–0.07)
Basophils Absolute: 0.1 10*3/uL (ref 0.0–0.1)
Basophils Relative: 0 %
Eosinophils Absolute: 0.2 10*3/uL (ref 0.0–0.5)
Eosinophils Relative: 2 %
HCT: 38.7 % (ref 36.0–46.0)
Hemoglobin: 12 g/dL (ref 12.0–15.0)
Immature Granulocytes: 1 %
Lymphocytes Relative: 19 %
Lymphs Abs: 2.8 10*3/uL (ref 0.7–4.0)
MCH: 26 pg (ref 26.0–34.0)
MCHC: 31 g/dL (ref 30.0–36.0)
MCV: 83.8 fL (ref 80.0–100.0)
Monocytes Absolute: 1.1 10*3/uL — ABNORMAL HIGH (ref 0.1–1.0)
Monocytes Relative: 7 %
Neutro Abs: 10.4 10*3/uL — ABNORMAL HIGH (ref 1.7–7.7)
Neutrophils Relative %: 71 %
Platelets: 302 10*3/uL (ref 150–400)
RBC: 4.62 MIL/uL (ref 3.87–5.11)
RDW: 17.6 % — ABNORMAL HIGH (ref 11.5–15.5)
WBC: 14.6 10*3/uL — ABNORMAL HIGH (ref 4.0–10.5)
nRBC: 0 % (ref 0.0–0.2)

## 2021-01-25 LAB — COMPREHENSIVE METABOLIC PANEL
ALT: 20 U/L (ref 0–44)
AST: 30 U/L (ref 15–41)
Albumin: 4 g/dL (ref 3.5–5.0)
Alkaline Phosphatase: 33 U/L — ABNORMAL LOW (ref 38–126)
Anion gap: 13 (ref 5–15)
BUN: 19 mg/dL (ref 8–23)
CO2: 19 mmol/L — ABNORMAL LOW (ref 22–32)
Calcium: 9.1 mg/dL (ref 8.9–10.3)
Chloride: 108 mmol/L (ref 98–111)
Creatinine, Ser: 0.81 mg/dL (ref 0.44–1.00)
GFR, Estimated: >60 mL/min (ref >60)
Glucose, Bld: 218 mg/dL — ABNORMAL HIGH (ref 70–99)
Potassium: 3.7 mmol/L (ref 3.5–5.1)
Sodium: 140 mmol/L (ref 135–145)
Total Bilirubin: 0.6 mg/dL (ref 0.3–1.2)
Total Protein: 6.9 g/dL (ref 6.5–8.1)

## 2021-01-25 LAB — LACTIC ACID, PLASMA
Lactic Acid, Venous: 3.7 mmol/L (ref 0.5–1.9)
Lactic Acid, Venous: 3.3 mmol/L (ref 0.5–1.9)
Lactic Acid, Venous: 3.1 mmol/L (ref 0.5–1.9)

## 2021-01-25 LAB — CREATININE, SERUM
Creatinine, Ser: 0.61 mg/dL (ref 0.44–1.00)
GFR, Estimated: >60 mL/min (ref >60)

## 2021-01-25 LAB — RESP PANEL BY RT-PCR (FLU A&B, COVID) ARPGX2
Influenza A by PCR: NEGATIVE
Influenza B by PCR: NEGATIVE
SARS Coronavirus 2 by RT PCR: NEGATIVE

## 2021-01-25 LAB — LIPASE, BLOOD: Lipase: 1225 U/L — ABNORMAL HIGH (ref 11–51)

## 2021-01-25 LAB — CBG MONITORING, ED
Glucose-Capillary: 293 mg/dL — ABNORMAL HIGH (ref 70–99)
Glucose-Capillary: 212 mg/dL — ABNORMAL HIGH (ref 70–99)

## 2021-01-25 MED ORDER — LABETALOL HCL 5 MG/ML IV SOLN: 10.0000 mg | INTRAVENOUS | Status: DC | PRN

## 2021-01-25 MED ORDER — HYDRALAZINE HCL 20 MG/ML IJ SOLN: 10.0000 mg | INTRAMUSCULAR | Status: DC | PRN

## 2021-01-25 MED ORDER — ONDANSETRON HCL 4 MG/2ML IJ SOLN
4.0000 mg | INTRAMUSCULAR | Status: AC
  Administered 2021-01-25: 4 mg via INTRAVENOUS
  Filled 2021-01-25: qty 2

## 2021-01-25 MED ORDER — METOPROLOL TARTRATE 50 MG PO TABS
50.0000 mg | ORAL_TABLET | Freq: Two times a day (BID) | ORAL | Status: DC
  Administered 2021-01-25 – 2021-01-30 (×11): 50 mg via ORAL
  Filled 2021-01-25 (×11): qty 1

## 2021-01-25 MED ORDER — SODIUM CHLORIDE 0.9 % IV SOLN
12.5000 mg | Freq: Four times a day (QID) | INTRAVENOUS | Status: DC | PRN
  Administered 2021-01-25: 12.5 mg via INTRAVENOUS
  Filled 2021-01-25: qty 0.5

## 2021-01-25 MED ORDER — INSULIN ASPART 100 UNIT/ML ~~LOC~~ SOLN: 0.0000 [IU] | Freq: Every day | SUBCUTANEOUS | Status: DC

## 2021-01-25 MED ORDER — INSULIN ASPART 100 UNIT/ML ~~LOC~~ SOLN
0.0000 [IU] | SUBCUTANEOUS | Status: DC
  Administered 2021-01-25: 5 [IU] via SUBCUTANEOUS
  Administered 2021-01-26 (×2): 2 [IU] via SUBCUTANEOUS
  Administered 2021-01-27: 3 [IU] via SUBCUTANEOUS
  Administered 2021-01-27 – 2021-01-28 (×2): 2 [IU] via SUBCUTANEOUS
  Filled 2021-01-25 (×8): qty 1

## 2021-01-25 MED ORDER — LACTATED RINGERS IV BOLUS
1000.0000 mL | Freq: Once | INTRAVENOUS | Status: AC
  Administered 2021-01-25: 1000 mL via INTRAVENOUS

## 2021-01-25 MED ORDER — LISINOPRIL 10 MG PO TABS: 20.0000 mg | ORAL_TABLET | Freq: Every day | ORAL | Status: DC

## 2021-01-25 MED ORDER — LACTATED RINGERS IV BOLUS
500.0000 mL | Freq: Once | INTRAVENOUS | Status: AC
  Administered 2021-01-25: 500 mL via INTRAVENOUS

## 2021-01-25 MED ORDER — HYDROMORPHONE HCL 1 MG/ML IJ SOLN
1.0000 mg | INTRAMUSCULAR | Status: AC
Start: 2021-01-25 — End: 2021-01-25
  Administered 2021-01-25: 1 mg via INTRAVENOUS
  Filled 2021-01-25: qty 1

## 2021-01-25 MED ORDER — ACETAMINOPHEN 650 MG RE SUPP: 650.0000 mg | Freq: Four times a day (QID) | RECTAL | Status: DC | PRN

## 2021-01-25 MED ORDER — IOHEXOL 300 MG/ML  SOLN
100.0000 mL | Freq: Once | INTRAMUSCULAR | Status: AC | PRN
  Administered 2021-01-25: 100 mL via INTRAVENOUS

## 2021-01-25 MED ORDER — LACTATED RINGERS IV BOLUS (SEPSIS)
500.0000 mL | Freq: Once | INTRAVENOUS | Status: AC
  Administered 2021-01-25: 500 mL via INTRAVENOUS

## 2021-01-25 MED ORDER — ONDANSETRON HCL 4 MG PO TABS
4.0000 mg | ORAL_TABLET | Freq: Four times a day (QID) | ORAL | Status: DC | PRN
  Administered 2021-01-28: 4 mg via ORAL
  Filled 2021-01-25: qty 1

## 2021-01-25 MED ORDER — MORPHINE SULFATE (PF) 4 MG/ML IV SOLN
4.0000 mg | Freq: Once | INTRAVENOUS | Status: AC
  Administered 2021-01-25: 4 mg via INTRAVENOUS
  Filled 2021-01-25: qty 1

## 2021-01-25 MED ORDER — LISINOPRIL 20 MG PO TABS
20.0000 mg | ORAL_TABLET | Freq: Every day | ORAL | Status: DC
  Administered 2021-01-25 – 2021-01-30 (×6): 20 mg via ORAL
  Filled 2021-01-25: qty 2
  Filled 2021-01-25 (×2): qty 1
  Filled 2021-01-25: qty 2
  Filled 2021-01-25 (×4): qty 1

## 2021-01-25 MED ORDER — HYDROMORPHONE HCL 1 MG/ML IJ SOLN
0.5000 mg | INTRAMUSCULAR | Status: DC | PRN
Start: 2021-01-25 — End: 2021-01-27
  Administered 2021-01-25 – 2021-01-27 (×7): 1 mg via INTRAVENOUS
  Filled 2021-01-25 (×7): qty 1

## 2021-01-25 MED ORDER — ACETAMINOPHEN 325 MG PO TABS: 650.0000 mg | ORAL_TABLET | Freq: Four times a day (QID) | ORAL | Status: DC | PRN

## 2021-01-25 MED ORDER — SODIUM CHLORIDE 0.9 % IV SOLN: INTRAVENOUS | Status: DC

## 2021-01-25 MED ORDER — ONDANSETRON HCL 4 MG/2ML IJ SOLN
4.0000 mg | Freq: Four times a day (QID) | INTRAMUSCULAR | Status: DC | PRN
  Administered 2021-01-25 – 2021-01-28 (×8): 4 mg via INTRAVENOUS
  Filled 2021-01-25 (×8): qty 2

## 2021-01-25 MED ORDER — ENOXAPARIN SODIUM 40 MG/0.4ML ~~LOC~~ SOLN
40.0000 mg | SUBCUTANEOUS | Status: DC
  Administered 2021-01-25 – 2021-01-30 (×6): 40 mg via SUBCUTANEOUS
  Filled 2021-01-25 (×6): qty 0.4

## 2021-01-25 MED ORDER — INSULIN ASPART 100 UNIT/ML ~~LOC~~ SOLN
0.0000 [IU] | Freq: Three times a day (TID) | SUBCUTANEOUS | Status: DC
  Administered 2021-01-25: 8 [IU] via SUBCUTANEOUS
  Filled 2021-01-25: qty 1

## 2021-01-25 NOTE — ED Provider Notes (Signed)
[facility] Emergency Department Provider Note  ____________________________________________   Event Date/Time   First MD Initiated Contact with Patient 01/25/21 0021     (approximate)  I have reviewed the triage vital signs and the nursing notes.   HISTORY  Chief Complaint Abdominal Pain    HPI Molly [name] is a 80 y.o. female with medical history as listed below which notably includes a history of nonalcoholic fatty liver disease and idiopathic pancreatitis.  She presents tonight by EMS for evaluation of acute onset and severe sharp stabbing upper abdominal pain that radiates around both sides and is accompanied with nausea and at least one if not several episodes of vomiting.   She reports no recent changes to her medications.  No alcohol use.  She said it feels similar to prior episodes with pancreatitis.  No history of gallbladder issues.  No recent fever or chest pain.  Some shortness of breath that seems to be associated with the pain in her upper abdomen because it hurts worse when she takes a deep breath.  No history of blood clots in the legs of the lungs.  No recent injuries.  Nothing in particular makes the pain better or worse and it is severe.        Past Medical History:  Diagnosis Date   Arrhythmia    Arthritis    Cancer ([place])    skin   CMV (cytomegalovirus infection) ([place])    Colon polyps    Diabetes mellitus, type 2 ([place])    Duodenitis    Dysrhythmia    Fatty liver disease, nonalcoholic    FH: colonic polyps    hx of-adenomatous   GERD (gastroesophageal reflux disease)    Heart murmur    dx'd in 1960s. followed by PCP   Hemorrhoid .   Hyperlipidemia    Hypertension    IBS (irritable bowel syndrome)    Osteoporosis    Osteoporosis    Pancreatitis    drug induced    Patient Active Problem List   Diagnosis Date Noted   Pancreatitis, recurrent 01/25/2021   Elevated lactic acid level 01/25/2021   Fatty  liver disease, nonalcoholic 48/18/5631   Acute pancreatitis 03/18/2018   HTN (hypertension) 06/06/2014   Diabetes ([place]) 06/06/2014   Hyperlipidemia 06/06/2014   BACK PAIN, THORACIC REGION, LEFT 10/01/2010   SINUSITIS - ACUTE-NOS 08/30/2009   SLEEP DISORDER, CHRONIC 08/30/2009   GOUT 04/03/2009   GERD 09/08/2008   PALPITATIONS 12/14/2007   DIABETES MELLITUS, TYPE II 07/20/2007   HYPERLIPIDEMIA 07/20/2007   IBS 07/20/2007    Past Surgical History:  Procedure Laterality Date   ABDOMINAL HYSTERECTOMY     Carotids  3/08   mild only   CATARACT EXTRACTION W/PHACO Right 06/29/2017   Procedure: CATARACT EXTRACTION PHACO AND INTRAOCULAR LENS PLACEMENT ([place])  Toric Diabetic Right;  Surgeon: Eulogio Bear, MD;  Location: [place];  Service: Ophthalmology;  Laterality: Right;  diabetic - oral meds   CATARACT EXTRACTION W/PHACO Right 07/27/2017   Procedure: CATARACT EXTRACTION PHACO AND INTRAOCULAR LENS PLACEMENT ([place]) LEFT DIABETIC TORIC;  Surgeon: Eulogio Bear, MD;  Location: [place];  Service: Ophthalmology;  Laterality: Right;  Diabetic - oral meds   COLONOSCOPY     COLONOSCOPY WITH PROPOFOL N/A 07/20/2019   Procedure: COLONOSCOPY WITH PROPOFOL;  Surgeon: Toledo, Benay Pike, MD;  Location: [facility] ENDOSCOPY;  Service: Gastroenterology;  Laterality: N/A;   cystocele, cystourethropexy  12/87   dexa  increase T - 3.1 spine, normal femur 1/04   ESOPHAGOGASTRODUODENOSCOPY     ESOPHAGOGASTRODUODENOSCOPY (EGD) WITH PROPOFOL N/A 04/16/2018   Procedure: ESOPHAGOGASTRODUODENOSCOPY (EGD) WITH PROPOFOL;  Surgeon: Manya Silvas, MD;  [facility] ENDOSCOPY;  Service: Endoscopy;  Laterality: N/A;   ESOPHAGOGASTRODUODENOSCOPY (EGD) WITH PROPOFOL N/A 07/20/2019   Procedure: ESOPHAGOGASTRODUODENOSCOPY (EGD) WITH PROPOFOL;  Surgeon: Toledo, Benay Pike, MD;  Location: [facility] ENDOSCOPY;  Service: Gastroenterology;  Laterality: N/A;   EYE SURGERY      hysterectomy (other)  [place]   vaginal deliveries     x2    Prior to Admission medications   Medication Sig Start Date End Date Taking? Authorizing Provider  acetaminophen (TYLENOL) 325 MG tablet Take 2 tablets (650 mg total) by mouth every 6 (six) hours as needed for mild pain or moderate pain (or Fever >/= 101). 03/22/18   Loletha Grayer, MD  Biotin 2500 MCG CAPS Take by mouth 2 (two) times daily.    [provider]  CALCIUM PO Take 750 mg by mouth 2 (two) times daily.    [provider]  Cholecalciferol (VITAMIN D PO) Take 500 Units by mouth 2 (two) times daily.    [provider]  glipiZIDE (GLUCOTROL XL) 10 MG 24 hr tablet Take 10 mg by mouth 2 (two) times daily. 07/25/19   [provider]  glucose blood (ACCU-CHEK COMPACT STRIPS) test strip 1 each by Other route as directed. Use as instructed     [provider]  Insulin Glargine (LANTUS SOLOSTAR) 100 UNIT/ML Solostar Pen Inject 38 Units into the skin every morning. 05/16/19   [provider]  Lancet Devices [facility]) lancets 1 each by Other route as needed. Use as instructed     [provider]  lisinopril (ZESTRIL) 5 MG tablet Take 5 mg by mouth daily.    [provider]  metFORMIN (GLUCOPHAGE-XR) 500 MG 24 hr tablet Take 1,000 mg by mouth 2 (two) times daily. 02/25/18   [provider]  metoprolol tartrate (LOPRESSOR) 50 MG tablet Take 50 mg by mouth 2 (two) times daily. 07/30/19   [provider]  Multiple Vitamins-Minerals (MULTIVITAL) tablet Take 1 tablet by mouth daily.      [provider]  omeprazole (PRILOSEC) 20 MG capsule TAKE ONE CAPSULE BY MOUTH EVERY DAY 02/26/11   Lucille Passy, MD  polyethylene glycol [facility] / GLYCOLAX) packet Take 17 g by mouth daily as needed. 03/22/18   Loletha Grayer, MD  potassium chloride (KLOR-CON) 20 MEQ packet Take by mouth 2 (two) times daily.    [provider]  simvastatin (ZOCOR) 20 MG tablet Take 0.5 tablets (10 mg total) by mouth at bedtime. 08/11/19 09/10/19  Saundra Shelling, MD    Allergies Alendronate, Codeine, Lansoprazole, Sulfonamide derivatives, and Adhesive [tape]  Family History  Problem Relation Age of Onset   Heart failure Mother    Coronary artery disease Mother    Heart attack Mother 47   Diabetes Mother    Heart failure Father    Coronary artery disease Father    Breast cancer Neg Hx     Social History Social History   Tobacco Use   Smoking status: Former Smoker    Years: 20.00    Quit date: 1992    Years since quitting: 30.2   Smokeless tobacco: Never Used  Scientific laboratory technician Use: Never used  Substance Use Topics   Alcohol use: Not Currently  Comment: Occ -1x/mo   Drug use: No    Review of Systems Constitutional: No fever/chills Eyes: No visual changes. ENT: No sore throat. Cardiovascular: Denies chest pain. Respiratory: Denies shortness of breath. Gastrointestinal: Sharp stabbing upper abdominal pain with nausea and vomiting. Genitourinary: Negative for dysuria. Musculoskeletal: Negative for neck pain.  Negative for back pain. Integumentary: Negative for rash. Neurological: Negative for headaches, focal weakness or numbness.   ____________________________________________   PHYSICAL EXAM:  VITAL SIGNS: ED Triage Vitals  Enc Vitals Group     BP 01/25/21 0027 (!) 148/112     Pulse Rate 01/25/21 0025 (!) 59     Resp 01/25/21 0025 20     Temp 01/25/21 0029 97.8 F (36.6 C)     Temp Source 01/25/21 0029 Oral     SpO2 01/25/21 0025 100 %     Weight 01/25/21 0023 83.4 kg (183 lb 13.8 oz)     Height --      Head Circumference --      Peak Flow --      Pain Score 01/25/21 0022 10     Pain Loc --      Pain Edu? --      Excl. in [place]? --     Constitutional: Alert and oriented.  Eyes: Conjunctivae are normal.  Head: Atraumatic. Nose: No  congestion/rhinnorhea. Mouth/Throat: Patient is wearing a mask. Neck: No stridor.  No meningeal signs.   Cardiovascular: Normal rate, regular rhythm. Good peripheral circulation. Respiratory: Normal respiratory effort.  No retractions. Gastrointestinal: Soft and nondistended.  Severe tenderness to palpation of the upper abdomen most notably in the epigastric region.  No rebound. Musculoskeletal: No lower extremity tenderness nor edema. No gross deformities of extremities. Neurologic:  Normal speech and language. No gross focal neurologic deficits are appreciated.  Skin:  Skin is warm, dry and intact. Psychiatric: Mood and affect are normal. Speech and behavior are normal.  ____________________________________________   LABS (all labs ordered are listed, but only abnormal results are displayed)  Labs Reviewed  LACTIC ACID, PLASMA - Abnormal; Notable for the following components:      Result Value   Lactic Acid, Venous 3.7 (*)    All other components within normal limits  LACTIC ACID, PLASMA - Abnormal; Notable for the following components:   Lactic Acid, Venous 3.3 (*)    All other components within normal limits  CBC WITH DIFFERENTIAL/PLATELET - Abnormal; Notable for the following components:   WBC 14.6 (*)    RDW 17.6 (*)    Neutro Abs 10.4 (*)    Monocytes Absolute 1.1 (*)    Abs Immature Granulocytes 0.09 (*)    All other components within normal limits  URINALYSIS, COMPLETE (UACMP) WITH MICROSCOPIC - Abnormal; Notable for the following components:   Color, Urine STRAW (*)    APPearance CLEAR (*)    Glucose, UA 150 (*)    Ketones, ur 5 (*)    Leukocytes,Ua SMALL (*)    All other components within normal limits  COMPREHENSIVE METABOLIC PANEL - Abnormal; Notable for the following components:   CO2 19 (*)    Glucose, Bld 218 (*)    Alkaline Phosphatase 33 (*)    All other components within normal limits  LIPASE, BLOOD - Abnormal; Notable for the following components:    Lipase 1,225 (*)    All other components within normal limits  URINE CULTURE  LIPID PANEL  LACTIC ACID, PLASMA   ____________________________________________  EKG  ED ECG REPORT I,  Hinda Kehr, the attending physician, personally viewed and interpreted this ECG.  Date: 01/25/2021 EKG Time: 00: 25 Rate: 61 Rhythm: normal sinus rhythm QRS Axis: normal Intervals: normal ST/T Wave abnormalities: normal Narrative Interpretation: no evidence of acute ischemia  ____________________________________________  RADIOLOGY I, Hinda Kehr, personally viewed and evaluated these images (plain radiographs) as part of my medical decision making, as well as reviewing the written report by the radiologist.  ED MD interpretation: Pancreatitis without evidence of necrosis or obvious sign of infection.  Official radiology report(s): CT ABDOMEN PELVIS W CONTRAST  Result Date: 01/25/2021 CLINICAL DATA:  80 year old female with nausea, vomiting, abdominal pain. Prior history of pancreatitis. EXAM: CT ABDOMEN AND PELVIS WITH CONTRAST TECHNIQUE: Multidetector CT imaging of the abdomen and pelvis was performed using the standard protocol following bolus administration of intravenous contrast. CONTRAST:  149mL OMNIPAQUE IOHEXOL 300 MG/ML  SOLN COMPARISON:  CT Abdomen and Pelvis 08/09/2019 and earlier. FINDINGS: Lower chest: Chronic lung base scarring and/or atelectasis is stable since 2020. Calcified granuloma partially visible in the superior segment right lower lobe. Calcified coronary artery atherosclerosis. No pericardial or pleural effusion. Hepatobiliary: Stable liver and gallbladder. Small volume perihepatic free fluid related to the pancreas. No biliary ductal enlargement. Pancreas: Confluent peripancreatic inflammation. Inflammation throughout the lesser sac and small volume of free fluid scattered in the upper abdomen. Partial pancreatic atrophy. But pancreatic enhancement appears preserved, no discrete  pancreatic necrosis identified. No ductal dilatation. No discrete pancreatic lesion. Spleen: Negative aside from mild adjacent inflammation, small volume free fluid. Adrenals/Urinary Tract: Normal adrenal glands. Stable kidneys, with chronic small benign renal cysts, symmetric renal enhancement and contrast excretion. Negative urinary bladder. Incidental numerous pelvic phleboliths. Stomach/Bowel: Decompressed large bowel from the hepatic flexure distally. Normal appendix (coronal image 58). Fluid-filled but nondilated distal small bowel. Mostly decompressed jejunum. Inflammation in the lesser sac with mild secondary involvement of the stomach and moderate involvement of the duodenum and root of the small bowel mesentery. No free air. Vascular/Lymphatic: Extensive Aortoiliac calcified atherosclerosis. Major arterial structures in the abdomen and pelvis are patent. Portal venous system including the splenic vein remains patent. No lymphadenopathy. Reproductive: Surgically absent uterus as before.  Negative ovaries. Other: No pelvic free fluid. Musculoskeletal: No acute osseous abnormality identified. IMPRESSION: 1. Acute Pancreatitis. Inflammation in the upper abdomen with secondary involvement of the duodenum. Small volume free fluid but no organized or drainable fluid collection at this time. No pancreatic necrosis or other adverse features. 2. Aortic Atherosclerosis (ICD10-I70.0). Electronically Signed   By: Genevie Ann M.D.   On: 01/25/2021 04:29   DG Chest Port 1 View  Result Date: 01/25/2021 CLINICAL DATA:  Possible sepsis EXAM: PORTABLE CHEST 1 VIEW COMPARISON:  08/09/2019 FINDINGS: Mild cardiomegaly with vascular congestion and diffuse interstitial opacity likely mild pulmonary edema. Mitral annular calcification. No pleural effusion or pneumothorax. Aortic atherosclerosis. IMPRESSION: Cardiomegaly with vascular congestion and mild pulmonary edema. Electronically Signed   By: Donavan Foil M.D.   On:  01/25/2021 00:46    ____________________________________________   PROCEDURES   Procedure(s) performed (including Critical Care):  Procedures   ____________________________________________   INITIAL IMPRESSION / MDM / [place] / ED COURSE  As part of my medical decision making, I reviewed the following data within the [place] notes reviewed and incorporated, Labs reviewed , EKG interpreted , Old chart reviewed, Discussed with admitting physician  and Notes from prior ED visits   Differential diagnosis includes, but is not limited to, pancreatitis, biliary disease,  acute intra-abdominal infection such as appendicitis or abscess, SBO/ileus, volvulus.  The patient is on the cardiac monitor to evaluate for evidence of arrhythmia and/or significant heart rate changes.  PE and ACS are very unlikely.  EKG is reassuring with no signs of ischemia.  Vital signs are stable and in fact the patient is nearly bradycardic but is obviously very uncomfortable.  I am ordering standard lab work including a lactic acid, CMP, CBC, lipase, urinalysis.  Lactated Ringer's 500 mL IV bolus, morphine 4 mg IV for pain and Zofran 4 mg IV for nausea.  Anticipate imaging after verifying renal function.  Patient has been evaluated with ultrasound in the past and has no history of biliary disease including gallstones and given her age and prior issues I will likely proceed with CT scan.     Clinical Course as of 01/25/21 0501  Fri Jan 25, 2021  0117 Lactic Acid, Venous(!!): 3.7 The patient is noted to have a lactate>3. With the current information available to me, I don't think the patient is in septic shock. The lactate>3, is related to liver failure or acute pancreatitis, not necessarily an infectious process.  She technically does not meet SIRS criteria because her vital signs are all within normal limits even though she does have a leukocytosis of 14.6.  The rest of her labs  are pending.  Patient received morphine 4 mg IV and Zofran 4 mg IV for her pain and nausea, and I am giving her a full 1 L of lactated Ringer's.  [CF]  0119 DG Chest [facility] I personally reviewed the patient's imaging and I am not certain that the interpretation of pulmonary vascular congestion or mild pulmonary edema correlates clinically.  I will continue to monitor but the patient may benefit from additional IV fluids and she is not in respiratory distress at this time.  There is no evidence of lobar pneumonia. [CF]  9323 Patient and husband report that the patient's pain is still severe and that the morphine she received was "useless".  I reviewed her record and see that she was treated with Dilaudid during her last similar presentation/admission, so I ordered Dilaudid 1 mg IV. [CF]  0206 CBC is essentially normal with no LFT elevations and in fact slightly decreased alkaline phosphatase.  CO2 is down slightly.  Of note, however, the lab called and asked for a redraw for the lipase because it is so high they need more blood.  Given her advanced age, leukocytosis, severe pain, elevated lactic acid, and probable pancreatitis, I think it is very reasonable to proceed with a CT scan with IV contrast to rule out an infectious process or complicated pancreatitis.  [CF]  0339 Lactic Acid, Venous(!!): 3.3 Slight improvement in lactic acid but it is still elevated.  However patient still does not meet SIRS criteria.  Lipase is substantially elevated at 1225.  Awaiting CT scan and then will admit.  Providing another 1 L LR. [CF]  0408 Patient is again stating that her pain is back and just as severe as ever.  She takes substantial narcotics at home including Dilaudid 4 mg tablets.  She is also acutely ill with recurrent pancreatitis.  I have ordered another Dilaudid 1 mg IV. [CF]  0413 Calcium: 9.1 No indication yet of hypocalcemia [CF]  0435 Pancreatitis without evidence of necrosis.  Consulting  hospitalist for admission. [CF]  [phone] Patient continues to not meet SIRS criteria.  Even though she has a leukocytosis, she has no  tachycardia, no fever nor hypothermia, no tachypnea.  I will hold off on empiric antibiotics. [CF]  [phone] Updated patient and husband. [CF]  0500 Discussed case by phone with Dr. Damita Dunnings with a hospitalist service.  She will admit the patient. [CF]    Clinical Course User Index [CF] Hinda Kehr, MD     ____________________________________________  FINAL CLINICAL IMPRESSION(S) / ED DIAGNOSES  Final diagnoses:  Idiopathic acute pancreatitis without infection or necrosis  Lactic acidosis  Leukocytosis, unspecified type     MEDICATIONS GIVEN DURING THIS VISIT:  Medications  lactated ringers bolus 500 mL (0 mLs Intravenous Stopped 01/25/21 0138)  morphine 4 MG/ML injection 4 mg (4 mg Intravenous Given 01/25/21 0052)  ondansetron (ZOFRAN) injection 4 mg (4 mg Intravenous Given 01/25/21 0052)  lactated ringers bolus 500 mL (0 mLs Intravenous Stopped 01/25/21 0209)  HYDROmorphone (DILAUDID) injection 1 mg (1 mg Intravenous Given 01/25/21 0139)  lactated ringers bolus 1,000 mL (1,000 mLs Intravenous New Bag/Given 01/25/21 0410)  iohexol (OMNIPAQUE) 300 MG/ML solution 100 mL (100 mLs Intravenous Contrast Given 01/25/21 0354)  HYDROmorphone (DILAUDID) injection 1 mg (1 mg Intravenous Given 01/25/21 0413)  ondansetron (ZOFRAN) injection 4 mg (4 mg Intravenous Given 01/25/21 0423)     ED Discharge Orders    None      *Please note:  Molly [name] was evaluated in Emergency Department on 01/25/2021 for the symptoms described in the history of present illness. She was evaluated in the context of the global COVID-19 pandemic, which necessitated consideration that the patient might be at risk for infection with the SARS-CoV-2 virus that causes COVID-19. Institutional protocols and algorithms that pertain to the evaluation of patients at risk for COVID-19 are in a state of  rapid change based on information released by regulatory bodies including the CDC and federal and state organizations. These policies and algorithms were followed during the patient's care in the ED.  Some ED evaluations and interventions may be delayed as a result of limited staffing during and after the pandemic.*  Note:  This document was prepared using Dragon voice recognition software and may include unintentional dictation errors.   Hinda Kehr, MD 01/25/21 [phone]

## 2021-01-25 NOTE — Hospital Course (Addendum)
Molly [name] is a 80 yo female with PMH idiopathic pancreatitis, hypertension, hyperlipidemia, IBS, osteoporosis, GERD, fatty liver disease, DM II, arthritis who presented to the hospital with upper abdominal pain.  Her pain was described similar to prior episodes of pancreatitis flares.  She says she follows outpatient with gastroenterology and has had a work-up in the past regarding etiology and her episodes were considered idiopathic. She also had associated nausea/vomiting which is also typical for her from past flares.  Lipase was 1225 and there was elevated WBC and lactic acid.  She underwent CT abdomen/pelvis which showed acute pancreatitis, no pancreatic necrosis or ileus/obstruction. She was made NPO and started on IVF. Her lipase normalized and abdominal xray showed constipation. She was started on a bowel regimen and a CLD on 3/20 for trial.  She tolerated clear liquids fairly well and plan was to increase further to full liquids on 01/28/2021.  She also had a bowel movement on 01/27/2021 after being started on laxatives as abdominal x-ray showed right-sided stool burden. She tolerated further diet advancement and was advanced to soft diet on 01/29/2021.  Bowel movements also continued while on laxatives.

## 2021-01-25 NOTE — ED Notes (Signed)
Pt given remote and pillowcase for pillow at this time

## 2021-01-25 NOTE — H&P (Signed)
History and Physical    Molly [name] VFI:[id number] DOB: [birth year] DOA: 01/25/2021  PCP: Idelle Crouch, MD   Patient coming from: Home  I have personally briefly reviewed patient's old medical records in [place]  Chief Complaint: Abdominal pain  HPI: Molly [name] is a 80 y.o. female with medical history significant for Insulin-dependent diabetes, HLD, nonalcoholic fatty liver disease and recurrent idiopathic pancreatitis who presents to the ER with upper abdominal pain similar to prior pancreatitis flares. Had several episodes of vomiting and was unable to keep anything down.  Emesis is nonbloody nonbilious and without coffee grounds.  Denies fever or chills or change in bowel habits.  Has had no cough, shortness of breath or chest pain ED course: On arrival, afebrile, BP 148/112, pulse 67 with O2 sat 97% on room air.  Blood work significant for leukocytosis of 14,000, lactic acid 3.7.  Lipase 1225.  Urinalysis without pyuria EKG as interpreted by me: Normal sinus rhythm at 61 with no acute ST-T wave changes Imaging: CT abdomen and pelvis: Acute pancreatitis.  No pancreatic necrosis or other adverse features.  Aortic atherosclerosis  Patient started on IV fluids pain medication.  Hospitalist consulted for admission.  Review of Systems: As per HPI otherwise all other systems on review of systems negative.    Past Medical History:  Diagnosis Date  . Arrhythmia   . Arthritis   . Cancer ([place])    skin  . CMV (cytomegalovirus infection) ([place])   . Colon polyps   . Diabetes mellitus, type 2 ([place])   . Duodenitis   . Dysrhythmia   . Fatty liver disease, nonalcoholic   . FH: colonic polyps    hx of-adenomatous  . GERD (gastroesophageal reflux disease)   . Heart murmur    dx'd in 1960s. followed by PCP  . Hemorrhoid .  Marland Kitchen Hyperlipidemia   . Hypertension   . IBS (irritable bowel syndrome)   . Osteoporosis   . Osteoporosis   . Pancreatitis    drug induced    Past  Surgical History:  Procedure Laterality Date  . ABDOMINAL HYSTERECTOMY    . Carotids  3/08   mild only  . CATARACT EXTRACTION W/PHACO Right 06/29/2017   Procedure: CATARACT EXTRACTION PHACO AND INTRAOCULAR LENS PLACEMENT ([place])  Toric Diabetic Right;  Surgeon: Eulogio Bear, MD;  Location: [facility];  Service: Ophthalmology;  Laterality: Right;  diabetic - oral meds  . CATARACT EXTRACTION W/PHACO Right 07/27/2017   Procedure: CATARACT EXTRACTION PHACO AND INTRAOCULAR LENS PLACEMENT ([place]) LEFT DIABETIC TORIC;  Surgeon: Eulogio Bear, MD;  Location: [facility];  Service: Ophthalmology;  Laterality: Right;  Diabetic - oral meds  . COLONOSCOPY    . COLONOSCOPY WITH PROPOFOL N/A 07/20/2019   Procedure: COLONOSCOPY WITH PROPOFOL;  Surgeon: Toledo, Benay Pike, MD;  Location: [facility] ENDOSCOPY;  Service: Gastroenterology;  Laterality: N/A;  . cystocele, cystourethropexy  12/87  . dexa     increase T - 3.1 spine, normal femur 1/04  . ESOPHAGOGASTRODUODENOSCOPY    . ESOPHAGOGASTRODUODENOSCOPY (EGD) WITH PROPOFOL N/A 04/16/2018   Procedure: ESOPHAGOGASTRODUODENOSCOPY (EGD) WITH PROPOFOL;  Surgeon: Manya Silvas, MD;  Location: [facility] ENDOSCOPY;  Service: Endoscopy;  Laterality: N/A;  . ESOPHAGOGASTRODUODENOSCOPY (EGD) WITH PROPOFOL N/A 07/20/2019   Procedure: ESOPHAGOGASTRODUODENOSCOPY (EGD) WITH PROPOFOL;  Surgeon: Toledo, Benay Pike, MD;  Location: [facility] ENDOSCOPY;  Service: Gastroenterology;  Laterality: N/A;  . EYE SURGERY    . hysterectomy (other)  1980  . TUBAL  LIGATION  1978  . vaginal deliveries     x2     reports that she quit smoking about 30 years ago. She quit after 20.00 years of use. She has never used smokeless tobacco. She reports previous alcohol use. She reports that she does not use drugs.  Allergies  Allergen Reactions  . Alendronate Nausea Only  . Codeine Nausea Only  . Lansoprazole     REACTION: hurts stomach  . Sulfonamide Derivatives     Childhood  reaction (doesn't remember)  . Adhesive [Tape] Rash    Blisters if on too long    Family History  Problem Relation Age of Onset  . Heart failure Mother   . Coronary artery disease Mother   . Heart attack Mother 28  . Diabetes Mother   . Heart failure Father   . Coronary artery disease Father   . Breast cancer Neg Hx       Prior to Admission medications   Medication Sig Start Date End Date Taking? Authorizing Provider  acetaminophen (TYLENOL) 325 MG tablet Take 2 tablets (650 mg total) by mouth every 6 (six) hours as needed for mild pain or moderate pain (or Fever >/= 101). 03/22/18   Loletha Grayer, MD  Biotin 2500 MCG CAPS Take by mouth 2 (two) times daily.    [provider]  CALCIUM PO Take 750 mg by mouth 2 (two) times daily.    [provider]  Cholecalciferol (VITAMIN D PO) Take 500 Units by mouth 2 (two) times daily.    [provider]  glipiZIDE (GLUCOTROL XL) 10 MG 24 hr tablet Take 10 mg by mouth 2 (two) times daily. 07/25/19   [provider]  glucose blood (ACCU-CHEK COMPACT STRIPS) test strip 1 each by Other route as directed. Use as instructed     [provider]  Insulin Glargine (LANTUS SOLOSTAR) 100 UNIT/ML Solostar Pen Inject 38 Units into the skin every morning. 05/16/19   [provider]  Lancet Devices [facility]) lancets 1 each by Other route as needed. Use as instructed     [provider]  lisinopril (ZESTRIL) 5 MG tablet Take 5 mg by mouth daily.    [provider]  metFORMIN (GLUCOPHAGE-XR) 500 MG 24 hr tablet Take 1,000 mg by mouth 2 (two) times daily. 02/25/18   [provider]  metoprolol tartrate (LOPRESSOR) 50 MG tablet Take 50 mg by mouth 2 (two) times daily. 07/30/19   [provider]  Multiple Vitamins-Minerals (MULTIVITAL) tablet Take 1 tablet by mouth daily.      [provider]  omeprazole (PRILOSEC) 20 MG capsule TAKE ONE CAPSULE BY MOUTH EVERY  DAY 02/26/11   Lucille Passy, MD  polyethylene glycol [facility] / GLYCOLAX) packet Take 17 g by mouth daily as needed. 03/22/18   Loletha Grayer, MD  potassium chloride (KLOR-CON) 20 MEQ packet Take by mouth 2 (two) times daily.    [provider]  simvastatin (ZOCOR) 20 MG tablet Take 0.5 tablets (10 mg total) by mouth at bedtime. 08/11/19 09/10/19  Saundra Shelling, MD    Physical Exam: Vitals:   01/25/21 0029 01/25/21 0030 01/25/21 0138 01/25/21 0410  BP:   (!) 142/54 (!) 169/58  Pulse:  67 60 71  Resp:   20 20  Temp: 97.8 F (36.6 C)     TempSrc: Oral     SpO2:   99% 96%  Weight:         Vitals:  01/25/21 0029 01/25/21 0030 01/25/21 0138 01/25/21 0410  BP:   (!) 142/54 (!) 169/58  Pulse:  67 60 71  Resp:   20 20  Temp: 97.8 F (36.6 C)     TempSrc: Oral     SpO2:   99% 96%  Weight:          Constitutional:  Frail and ill-appearing elderly female.  Alert and oriented x 3 .  HEENT:      Head: Normocephalic and atraumatic.         Eyes: PERLA, EOMI, Conjunctivae are normal. Sclera is non-icteric.       Mouth/Throat: Mucous membranes are moist.       Neck: Supple with no signs of meningismus. Cardiovascular: Regular rate and rhythm. No murmurs, gallops, or rubs. 2+ symmetrical distal pulses are present . No JVD. No LE edema Respiratory: Respiratory effort normal .Lungs sounds clear bilaterally. No wheezes, crackles, or rhonchi.  Gastrointestinal: Soft, mild tenderness on palpation in upper abdomen, and non distended with positive bowel sounds.  Genitourinary: No CVA tenderness. Musculoskeletal: Nontender with normal range of motion in all extremities. No cyanosis, or erythema of extremities. Neurologic:  Face is symmetric. Moving all extremities. No gross focal neurologic deficits . Skin: Skin is warm, dry.  No rash or ulcers Psychiatric: Mood and affect are normal    Labs on Admission: I have personally reviewed following labs and imaging  studies  CBC: Recent Labs  Lab 01/25/21 0030  WBC 14.6*  NEUTROABS 10.4*  HGB 12.0  HCT 38.7  MCV 83.8  PLT 142   Basic Metabolic Panel: Recent Labs  Lab 01/25/21 0030  NA 140  K 3.7  CL 108  CO2 19*  GLUCOSE 218*  BUN 19  CREATININE 0.81  CALCIUM 9.1   GFR: CrCl cannot be calculated (Unknown ideal weight.). Liver Function Tests: Recent Labs  Lab 01/25/21 0030  AST 30  ALT 20  ALKPHOS 33*  BILITOT 0.6  PROT 6.9  ALBUMIN 4.0   Recent Labs  Lab 01/25/21 0207  LIPASE 1,225*   No results for input(s): AMMONIA in the last 168 hours. Coagulation Profile: No results for input(s): INR, PROTIME in the last 168 hours. Cardiac Enzymes: No results for input(s): CKTOTAL, CKMB, CKMBINDEX, TROPONINI in the last 168 hours. BNP (last 3 results) No results for input(s): PROBNP in the last 8760 hours. HbA1C: No results for input(s): HGBA1C in the last 72 hours. CBG: No results for input(s): GLUCAP in the last 168 hours. Lipid Profile: No results for input(s): CHOL, HDL, LDLCALC, TRIG, CHOLHDL, LDLDIRECT in the last 72 hours. Thyroid Function Tests: No results for input(s): TSH, T4TOTAL, FREET4, T3FREE, THYROIDAB in the last 72 hours. Anemia Panel: No results for input(s): VITAMINB12, FOLATE, FERRITIN, TIBC, IRON, RETICCTPCT in the last 72 hours. Urine analysis:    Component Value Date/Time   COLORURINE STRAW (A) 01/25/2021 0030   APPEARANCEUR CLEAR (A) 01/25/2021 0030   LABSPEC 1.013 01/25/2021 0030   PHURINE 5.0 01/25/2021 0030   GLUCOSEU 150 (A) 01/25/2021 0030   HGBUR NEGATIVE 01/25/2021 0030   BILIRUBINUR NEGATIVE 01/25/2021 0030   KETONESUR 5 (A) 01/25/2021 0030   PROTEINUR NEGATIVE 01/25/2021 0030   NITRITE NEGATIVE 01/25/2021 0030   LEUKOCYTESUR SMALL (A) 01/25/2021 0030    Radiological Exams on Admission: CT ABDOMEN PELVIS W CONTRAST  Result Date: 01/25/2021 CLINICAL DATA:  80 year old female with nausea, vomiting, abdominal pain. Prior history of  pancreatitis. EXAM: CT ABDOMEN AND PELVIS WITH CONTRAST TECHNIQUE: Multidetector  CT imaging of the abdomen and pelvis was performed using the standard protocol following bolus administration of intravenous contrast. CONTRAST:  159mL OMNIPAQUE IOHEXOL 300 MG/ML  SOLN COMPARISON:  CT Abdomen and Pelvis 08/09/2019 and earlier. FINDINGS: Lower chest: Chronic lung base scarring and/or atelectasis is stable since 2020. Calcified granuloma partially visible in the superior segment right lower lobe. Calcified coronary artery atherosclerosis. No pericardial or pleural effusion. Hepatobiliary: Stable liver and gallbladder. Small volume perihepatic free fluid related to the pancreas. No biliary ductal enlargement. Pancreas: Confluent peripancreatic inflammation. Inflammation throughout the lesser sac and small volume of free fluid scattered in the upper abdomen. Partial pancreatic atrophy. But pancreatic enhancement appears preserved, no discrete pancreatic necrosis identified. No ductal dilatation. No discrete pancreatic lesion. Spleen: Negative aside from mild adjacent inflammation, small volume free fluid. Adrenals/Urinary Tract: Normal adrenal glands. Stable kidneys, with chronic small benign renal cysts, symmetric renal enhancement and contrast excretion. Negative urinary bladder. Incidental numerous pelvic phleboliths. Stomach/Bowel: Decompressed large bowel from the hepatic flexure distally. Normal appendix (coronal image 58). Fluid-filled but nondilated distal small bowel. Mostly decompressed jejunum. Inflammation in the lesser sac with mild secondary involvement of the stomach and moderate involvement of the duodenum and root of the small bowel mesentery. No free air. Vascular/Lymphatic: Extensive Aortoiliac calcified atherosclerosis. Major arterial structures in the abdomen and pelvis are patent. Portal venous system including the splenic vein remains patent. No lymphadenopathy. Reproductive: Surgically absent uterus  as before.  Negative ovaries. Other: No pelvic free fluid. Musculoskeletal: No acute osseous abnormality identified. IMPRESSION: 1. Acute Pancreatitis. Inflammation in the upper abdomen with secondary involvement of the duodenum. Small volume free fluid but no organized or drainable fluid collection at this time. No pancreatic necrosis or other adverse features. 2. Aortic Atherosclerosis (ICD10-I70.0). Electronically Signed   By: Genevie Ann M.D.   On: 01/25/2021 04:29   DG Chest Port 1 View  Result Date: 01/25/2021 CLINICAL DATA:  Possible sepsis EXAM: PORTABLE CHEST 1 VIEW COMPARISON:  08/09/2019 FINDINGS: Mild cardiomegaly with vascular congestion and diffuse interstitial opacity likely mild pulmonary edema. Mitral annular calcification. No pleural effusion or pneumothorax. Aortic atherosclerosis. IMPRESSION: Cardiomegaly with vascular congestion and mild pulmonary edema. Electronically Signed   By: Donavan Foil M.D.   On: 01/25/2021 00:46     Assessment/Plan 80 year old female with history of insulin-dependent diabetes, HTN, HLD, nonalcoholic fatty liver disease and recurrent idiopathic pancreatitis presenting with upper abdominal pain similar to prior pancreatitis flares associated with vomiting.  Lipase 1225     Acute pancreatitis with history of recurrent pancreatitis -Patient presents with pain typical for pancreatitis and vomiting with lipase of 1225 -CT abdomen and pelvis with acute pancreatitis without necrosis -Ice chips for now -IV fluids, IV antiemetics, IV narcotic pain meds and supportive care    Elevated lactic acid level -Lactic acid elevated at 3.7> 3.3.  Associated with mild leukocytosis of 14,000 -Sepsis not suspected at this time, has no fever or tachycardia, but close monitoring for signs and symptoms consistent with infection -Continue IV fluids and monitor    Fatty liver disease, nonalcoholic -Liver enzymes within normal limits    Diabetes (HCC) -Sliding scale insulin  coverage    Hyperlipidemia -Check triglyceride level -Continue home statin  Hypertension, metoprolol, lisinopril -Continue home meds      DVT prophylaxis: Lovenox  Code Status: full code  Family Communication:  none  Disposition Plan: Back to previous home environment Consults called: none  Status: Observation    Athena Masse MD [facility]  01/25/2021, 5:06 AM

## 2021-01-25 NOTE — Assessment & Plan Note (Addendum)
-   lipase 1225 on admission; diagnosed with idiopathic recurrent pancreatitis -CT abdomen/pelvis performed on admission.  No necrosis/ileus/obstruction -Continue pain and nausea medication PRN -Lipase significantly improved. Normalized on 3/20.  -Abdominal x-ray obtained on 01/27/2021: Shows underlying constipation with stool burden.  Continue laxative regimen -CLD tolerated; advance to FLD on 3/21; advanced to soft diet on 3/22 -If tolerates soft diet, could be discharged on Wednesday

## 2021-01-25 NOTE — Progress Notes (Signed)
PROGRESS NOTE    [name]   ZOX:[id number]  DOB: 10-25-1941  DOA: 01/25/2021     0  PCP: Molly Crouch, MD  CC: abdominal pain, N/V  Hospital Course: Molly [name] is a 80 yo female with PMH idiopathic pancreatitis, hypertension, hyperlipidemia, IBS, osteoporosis, GERD, fatty liver disease, DM II, arthritis who presented to the hospital with upper abdominal pain.  Her pain was described similar to prior episodes of pancreatitis flares.  She says she follows outpatient with gastroenterology and has had a work-up in the past regarding etiology and her episodes were considered idiopathic. She also had associated nausea/vomiting which is also typical for her from past flares.  Lipase was 1225 and there was elevated WBC and lactic acid.  She underwent CT abdomen/pelvis which showed acute pancreatitis, no pancreatic necrosis or ileus/obstruction. She was made NPO and started on IVF.    Interval History:  Seen this morning in the ER, husband bedside.  Discussed plan for continuing pain and nausea control as well as IVF.  She was still nauseous and in pain with no desire to initiate on a diet at this time.  ROS: Constitutional: negative for chills and fevers, Respiratory: negative for cough, Cardiovascular: negative for chest pain and Gastrointestinal: positive for abdominal pain and nausea  Assessment & Plan: * Acute pancreatitis - lipase 1225 on admission; diagnosed with idiopathic recurrent pancreatitis -CT abdomen/pelvis performed on admission.  No necrosis/ileus/obstruction -Continue n.p.o., okay for ice chips/sips and medicines -Continue pain and nausea medication  Hypertension - continue lisinopril and lopressor  Elevated lactic acid level - low suspicion for infection; likely hypoperfusion from GI losses - continue IVF - no need to trend at this time   Hyperlipidemia - TG 70 - continue statin  Diabetes ([place]) - continue SSI and CBGs  Fatty liver disease,  nonalcoholic - LFTs WNL - trend CMP    Old records reviewed in assessment of this patient  Antimicrobials: n/a  DVT prophylaxis: enoxaparin (LOVENOX) injection 40 mg Start: 01/25/21 0515   Code Status:   Code Status: Full Code Family Communication: husband bedside  Disposition Plan: Status is: Observation  The patient will require care spanning > 2 midnights and should be moved to inpatient because: Ongoing active pain requiring inpatient pain management, IV treatments appropriate due to intensity of illness or inability to take PO and Inpatient level of care appropriate due to severity of illness  Dispo: The patient is from: Home              Anticipated d/c is to: Home              Patient currently is not medically stable to d/c.   Difficult to place patient No  Risk of unplanned readmission score:     Objective: Blood pressure (!) 148/58, pulse 63, temperature 97.8 F (36.6 C), temperature source Oral, resp. rate 18, height 5\' 4"  (1.626 m), weight 77.1 kg, SpO2 94 %.  Examination: General appearance: alert, cooperative and no distress Head: Normocephalic, without obvious abnormality, atraumatic Eyes: EOMI Lungs: clear to auscultation bilaterally Heart: regular rate and rhythm and S1, S2 normal Abdomen: Nonspecific tenderness to palpation throughout, no rebound or guarding.  Bowel sounds present Extremities: No edema Skin: mobility and turgor normal Neurologic: Grossly normal  Consultants:   n/a  Procedures:     Data Reviewed: I have personally reviewed following labs and imaging studies Results for orders placed or performed during the hospital encounter of 01/25/21 (from the past  24 hour(s))  Lactic acid, plasma     Status: Abnormal   Collection Time: 01/25/21 12:17 AM  Result Value Ref Range   Lactic Acid, Venous 3.7 (HH) 0.5 - 1.9 mmol/L  CBC WITH DIFFERENTIAL     Status: Abnormal   Collection Time: 01/25/21 12:30 AM  Result Value Ref Range   WBC  14.6 (H) 4.0 - 10.5 K/uL   RBC 4.62 3.87 - 5.11 MIL/uL   Hemoglobin 12.0 12.0 - 15.0 g/dL   HCT 38.7 36.0 - 46.0 %   MCV 83.8 80.0 - 100.0 fL   MCH 26.0 26.0 - 34.0 pg   MCHC 31.0 30.0 - 36.0 g/dL   RDW 17.6 (H) 11.5 - 15.5 %   Platelets 302 150 - 400 K/uL   nRBC 0.0 0.0 - 0.2 %   Neutrophils Relative % 71 %   Neutro Abs 10.4 (H) 1.7 - 7.7 K/uL   Lymphocytes Relative 19 %   Lymphs Abs 2.8 0.7 - 4.0 K/uL   Monocytes Relative 7 %   Monocytes Absolute 1.1 (H) 0.1 - 1.0 K/uL   Eosinophils Relative 2 %   Eosinophils Absolute 0.2 0.0 - 0.5 K/uL   Basophils Relative 0 %   Basophils Absolute 0.1 0.0 - 0.1 K/uL   Immature Granulocytes 1 %   Abs Immature Granulocytes 0.09 (H) 0.00 - 0.07 K/uL  Urinalysis, Complete w Microscopic     Status: Abnormal   Collection Time: 01/25/21 12:30 AM  Result Value Ref Range   Color, Urine STRAW (A) YELLOW   APPearance CLEAR (A) CLEAR   Specific Gravity, Urine 1.013 1.005 - 1.030   pH 5.0 5.0 - 8.0   Glucose, UA 150 (A) NEGATIVE mg/dL   Hgb urine dipstick NEGATIVE NEGATIVE   Bilirubin Urine NEGATIVE NEGATIVE   Ketones, ur 5 (A) NEGATIVE mg/dL   Protein, ur NEGATIVE NEGATIVE mg/dL   Nitrite NEGATIVE NEGATIVE   Leukocytes,Ua SMALL (A) NEGATIVE   RBC / HPF 0-5 0 - 5 RBC/hpf   WBC, UA 11-20 0 - 5 WBC/hpf   Bacteria, UA NONE SEEN NONE SEEN   Squamous Epithelial / LPF 0-5 0 - 5   Mucus PRESENT   Comprehensive metabolic panel     Status: Abnormal   Collection Time: 01/25/21 12:30 AM  Result Value Ref Range   Sodium 140 135 - 145 mmol/L   Potassium 3.7 3.5 - 5.1 mmol/L   Chloride 108 98 - 111 mmol/L   CO2 19 (L) 22 - 32 mmol/L   Glucose, Bld 218 (H) 70 - 99 mg/dL   BUN 19 8 - 23 mg/dL   Creatinine, Ser 0.81 0.44 - 1.00 mg/dL   Calcium 9.1 8.9 - 10.3 mg/dL   Total Protein 6.9 6.5 - 8.1 g/dL   Albumin 4.0 3.5 - 5.0 g/dL   AST 30 15 - 41 U/L   ALT 20 0 - 44 U/L   Alkaline Phosphatase 33 (L) 38 - 126 U/L   Total Bilirubin 0.6 0.3 - 1.2 mg/dL    GFR, Estimated >60 >60 mL/min   Anion gap 13 5 - 15  Lipase, blood     Status: Abnormal   Collection Time: 01/25/21  2:07 AM  Result Value Ref Range   Lipase 1,225 (H) 11 - 51 U/L  Lactic acid, plasma     Status: Abnormal   Collection Time: 01/25/21  2:10 AM  Result Value Ref Range   Lactic Acid, Venous 3.3 (HH) 0.5 - 1.9 mmol/L  Lactic acid, plasma     Status: Abnormal   Collection Time: 01/25/21  4:50 AM  Result Value Ref Range   Lactic Acid, Venous 3.1 (HH) 0.5 - 1.9 mmol/L  Lipid panel     Status: None   Collection Time: 01/25/21  5:00 AM  Result Value Ref Range   Cholesterol 148 0 - 200 mg/dL   Triglycerides 70 <150 mg/dL   HDL 56 >40 mg/dL   Total CHOL/HDL Ratio 2.6 RATIO   VLDL 14 0 - 40 mg/dL   LDL Cholesterol 78 0 - 99 mg/dL  CBC     Status: Abnormal   Collection Time: 01/25/21  5:02 AM  Result Value Ref Range   WBC 12.2 (H) 4.0 - 10.5 K/uL   RBC 4.58 3.87 - 5.11 MIL/uL   Hemoglobin 12.1 12.0 - 15.0 g/dL   HCT 37.3 36.0 - 46.0 %   MCV 81.4 80.0 - 100.0 fL   MCH 26.4 26.0 - 34.0 pg   MCHC 32.4 30.0 - 36.0 g/dL   RDW 17.3 (H) 11.5 - 15.5 %   Platelets 244 150 - 400 K/uL   nRBC 0.0 0.0 - 0.2 %  Creatinine, serum     Status: None   Collection Time: 01/25/21  5:02 AM  Result Value Ref Range   Creatinine, Ser 0.61 0.44 - 1.00 mg/dL   GFR, Estimated >60 >60 mL/min  Resp Panel by RT-PCR (Flu A&B, Covid) Nasopharyngeal Swab     Status: None   Collection Time: 01/25/21  7:38 AM   Specimen: Nasopharyngeal Swab; Nasopharyngeal(NP) swabs in vial transport medium  Result Value Ref Range   SARS Coronavirus 2 by RT PCR NEGATIVE NEGATIVE   Influenza A by PCR NEGATIVE NEGATIVE   Influenza B by PCR NEGATIVE NEGATIVE  CBG monitoring, ED     Status: Abnormal   Collection Time: 01/25/21  8:10 AM  Result Value Ref Range   Glucose-Capillary 293 (H) 70 - 99 mg/dL  CBG monitoring, ED     Status: Abnormal   Collection Time: 01/25/21 12:11 PM  Result Value Ref Range    Glucose-Capillary 212 (H) 70 - 99 mg/dL    Recent Results (from the past 240 hour(s))  Resp Panel by RT-PCR (Flu A&B, Covid) Nasopharyngeal Swab     Status: None   Collection Time: 01/25/21  7:38 AM   Specimen: Nasopharyngeal Swab; Nasopharyngeal(NP) swabs in vial transport medium  Result Value Ref Range Status   SARS Coronavirus 2 by RT PCR NEGATIVE NEGATIVE Final    Comment: (NOTE) SARS-CoV-2 target nucleic acids are NOT DETECTED.  The SARS-CoV-2 RNA is generally detectable in upper respiratory specimens during the acute phase of infection. The lowest concentration of SARS-CoV-2 viral copies this assay can detect is 138 copies/mL. A negative result does not preclude SARS-Cov-2 infection and should not be used as the sole basis for treatment or other patient management decisions. A negative result may occur with  improper specimen collection/handling, submission of specimen other than nasopharyngeal swab, presence of viral mutation(s) within the areas targeted by this assay, and inadequate number of viral copies(<138 copies/mL). A negative result must be combined with clinical observations, patient history, and epidemiological information. The expected result is Negative.  Fact Sheet for Patients:  EntrepreneurPulse.com.au  Fact Sheet for Healthcare Providers:  IncredibleEmployment.be  This test is no t yet approved or cleared by the [place] FDA and  has been authorized for detection and/or diagnosis of SARS-CoV-2 by FDA under an Emergency Use  Authorization (EUA). This EUA will remain  in effect (meaning this test can be used) for the duration of the COVID-19 declaration under Section 564(b)(1) of the Act, 21 U.S.C.section 360bbb-3(b)(1), unless the authorization is terminated  or revoked sooner.       Influenza A by PCR NEGATIVE NEGATIVE Final   Influenza B by PCR NEGATIVE NEGATIVE Final    Comment: (NOTE) The Xpert Xpress  SARS-CoV-2/FLU/RSV plus assay is intended as an aid in the diagnosis of influenza from Nasopharyngeal swab specimens and should not be used as a sole basis for treatment. Nasal washings and aspirates are unacceptable for Xpert Xpress SARS-CoV-2/FLU/RSV testing.  Fact Sheet for Patients: EntrepreneurPulse.com.au  Fact Sheet for Healthcare Providers: IncredibleEmployment.be  This test is not yet approved or cleared by the [place] FDA and has been authorized for detection and/or diagnosis of SARS-CoV-2 by FDA under an Emergency Use Authorization (EUA). This EUA will remain in effect (meaning this test can be used) for the duration of the COVID-19 declaration under Section 564(b)(1) of the Act, 21 U.S.C. section 360bbb-3(b)(1), unless the authorization is terminated or revoked.  Performed at [facility], [address] [place], [place] [address]      Radiology Studies: CT ABDOMEN PELVIS W CONTRAST  Result Date: 01/25/2021 CLINICAL DATA:  80 year old female with nausea, vomiting, abdominal pain. Prior history of pancreatitis. EXAM: CT ABDOMEN AND PELVIS WITH CONTRAST TECHNIQUE: Multidetector CT imaging of the abdomen and pelvis was performed using the standard protocol following bolus administration of intravenous contrast. CONTRAST:  191mL OMNIPAQUE IOHEXOL 300 MG/ML  SOLN COMPARISON:  CT Abdomen and Pelvis 08/09/2019 and earlier. FINDINGS: Lower chest: Chronic lung base scarring and/or atelectasis is stable since 2020. Calcified granuloma partially visible in the superior segment right lower lobe. Calcified coronary artery atherosclerosis. No pericardial or pleural effusion. Hepatobiliary: Stable liver and gallbladder. Small volume perihepatic free fluid related to the pancreas. No biliary ductal enlargement. Pancreas: Confluent peripancreatic inflammation. Inflammation throughout the lesser sac and small volume of free fluid scattered in  the upper abdomen. Partial pancreatic atrophy. But pancreatic enhancement appears preserved, no discrete pancreatic necrosis identified. No ductal dilatation. No discrete pancreatic lesion. Spleen: Negative aside from mild adjacent inflammation, small volume free fluid. Adrenals/Urinary Tract: Normal adrenal glands. Stable kidneys, with chronic small benign renal cysts, symmetric renal enhancement and contrast excretion. Negative urinary bladder. Incidental numerous pelvic phleboliths. Stomach/Bowel: Decompressed large bowel from the hepatic flexure distally. Normal appendix (coronal image 58). Fluid-filled but nondilated distal small bowel. Mostly decompressed jejunum. Inflammation in the lesser sac with mild secondary involvement of the stomach and moderate involvement of the duodenum and root of the small bowel mesentery. No free air. Vascular/Lymphatic: Extensive Aortoiliac calcified atherosclerosis. Major arterial structures in the abdomen and pelvis are patent. Portal venous system including the splenic vein remains patent. No lymphadenopathy. Reproductive: Surgically absent uterus as before.  Negative ovaries. Other: No pelvic free fluid. Musculoskeletal: No acute osseous abnormality identified. IMPRESSION: 1. Acute Pancreatitis. Inflammation in the upper abdomen with secondary involvement of the duodenum. Small volume free fluid but no organized or drainable fluid collection at this time. No pancreatic necrosis or other adverse features. 2. Aortic Atherosclerosis (ICD10-I70.0). Electronically Signed   By: Genevie Ann M.D.   On: 01/25/2021 04:29   DG Chest Port 1 View  Result Date: 01/25/2021 CLINICAL DATA:  Possible sepsis EXAM: PORTABLE CHEST 1 VIEW COMPARISON:  08/09/2019 FINDINGS: Mild cardiomegaly with vascular congestion and diffuse interstitial opacity likely mild pulmonary edema. Mitral annular  calcification. No pleural effusion or pneumothorax. Aortic atherosclerosis. IMPRESSION: Cardiomegaly with  vascular congestion and mild pulmonary edema. Electronically Signed   By: Donavan Foil M.D.   On: 01/25/2021 00:46   CT ABDOMEN PELVIS W CONTRAST  Final Result    DG Chest Port 1 View  Final Result      Scheduled Meds: . enoxaparin (LOVENOX) injection  40 mg Subcutaneous Q24H  . insulin aspart  0-15 Units Subcutaneous Q4H  . lisinopril  20 mg Oral Daily  . metoprolol tartrate  50 mg Oral BID   PRN Meds: acetaminophen **OR** acetaminophen, hydrALAZINE, HYDROmorphone (DILAUDID) injection, labetalol, ondansetron **OR** ondansetron (ZOFRAN) IV, promethazine (PHENERGAN) injection Continuous Infusions: . sodium chloride 100 mL/hr at 01/25/21 0641  . promethazine (PHENERGAN) injection Stopped (01/25/21 1318)     LOS: 0 days  Time spent: Greater than 50% of the 35 minute visit was spent in counseling/coordination of care for the patient as laid out in the A&P.   Dwyane Dee, MD [facility] 01/25/2021, 2:24 PM

## 2021-01-25 NOTE — Assessment & Plan Note (Signed)
-   continue SSI and CBGs 

## 2021-01-25 NOTE — ED Notes (Signed)
Admit MD messaged regarding pt's nausea.

## 2021-01-25 NOTE — Progress Notes (Signed)
Inpatient Diabetes Program Recommendations  AACE/ADA: New Consensus Statement on Inpatient Glycemic Control (2015)  Target Ranges:  Prepandial:   less than 140 mg/dL      Peak postprandial:   less than 180 mg/dL (1-2 hours)      Critically ill patients:  140 - 180 mg/dL   Results for Molly [name], Molly [name] (MRN [id number]) as of 01/25/2021 08:32  Ref. Range 01/25/2021 08:10  Glucose-Capillary Latest Ref Range: 70 - 99 mg/dL 293 (H)  8 units NOVOLOG      Home DM Meds: Lantus 48 units Daily       Metformin 1000 mg BID       Glipizide 10 mg BID   Current Orders: Novolog 0-15 units ac/hs     MD- Note patient takes Lantus at home.  CBG 293 this AM.    Please consider:  1. Start Lantus 24 units Daily (50% home dose to start)  2. Increase frequency of Novolog SSI to Q4 hours while only allowed Ice chips     --Will follow patient during hospitalization--  Wyn Quaker RN, MSN, CDE Diabetes Coordinator Inpatient Glycemic Control Team Team Pager: [phone] (8a-5p)

## 2021-01-25 NOTE — ED Notes (Signed)
Critical lab 'Lactic acid-3.7  Dr. Karma Greaser notified face to face

## 2021-01-25 NOTE — ED Triage Notes (Signed)
80 y/o female arrived to the [facility] by EMS with a CC of upper right and left abd pain. Pt suspects its a pancreatitis flare up. Pt notes her last flare up was last October.. Pt notes she took 4 mg dilaudid at home and 15 mins later she threw up. EMS states they gave 4 mg of zofran. Pt is A&Ox4

## 2021-01-25 NOTE — Assessment & Plan Note (Signed)
-   continue lisinopril and lopressor

## 2021-01-25 NOTE — Assessment & Plan Note (Signed)
-   LFTs WNL - trend CMP

## 2021-01-25 NOTE — Assessment & Plan Note (Signed)
-   TG 70 - continue statin

## 2021-01-25 NOTE — Assessment & Plan Note (Addendum)
-   low suspicion for infection; likely hypoperfusion from GI losses - no need to trend at this time

## 2021-01-25 NOTE — ED Notes (Signed)
This nurse called CT to make CT staff aware that pt is ready for CT scan

## 2021-01-26 DIAGNOSIS — Z8249 Family history of ischemic heart disease and other diseases of the circulatory system: Secondary | ICD-10-CM | POA: Diagnosis not present

## 2021-01-26 DIAGNOSIS — E872 Acidosis: Secondary | ICD-10-CM | POA: Diagnosis present

## 2021-01-26 DIAGNOSIS — K5900 Constipation, unspecified: Secondary | ICD-10-CM | POA: Diagnosis present

## 2021-01-26 DIAGNOSIS — E785 Hyperlipidemia, unspecified: Secondary | ICD-10-CM | POA: Diagnosis present

## 2021-01-26 DIAGNOSIS — E119 Type 2 diabetes mellitus without complications: Secondary | ICD-10-CM | POA: Diagnosis present

## 2021-01-26 DIAGNOSIS — Z79899 Other long term (current) drug therapy: Secondary | ICD-10-CM | POA: Diagnosis not present

## 2021-01-26 DIAGNOSIS — R7989 Other specified abnormal findings of blood chemistry: Secondary | ICD-10-CM | POA: Diagnosis present

## 2021-01-26 DIAGNOSIS — Z833 Family history of diabetes mellitus: Secondary | ICD-10-CM | POA: Diagnosis not present

## 2021-01-26 DIAGNOSIS — E876 Hypokalemia: Secondary | ICD-10-CM | POA: Diagnosis not present

## 2021-01-26 DIAGNOSIS — I10 Essential (primary) hypertension: Secondary | ICD-10-CM | POA: Diagnosis present

## 2021-01-26 DIAGNOSIS — Z7984 Long term (current) use of oral hypoglycemic drugs: Secondary | ICD-10-CM | POA: Diagnosis not present

## 2021-01-26 DIAGNOSIS — J9601 Acute respiratory failure with hypoxia: Secondary | ICD-10-CM | POA: Diagnosis not present

## 2021-01-26 DIAGNOSIS — K219 Gastro-esophageal reflux disease without esophagitis: Secondary | ICD-10-CM | POA: Diagnosis present

## 2021-01-26 DIAGNOSIS — K760 Fatty (change of) liver, not elsewhere classified: Secondary | ICD-10-CM | POA: Diagnosis present

## 2021-01-26 DIAGNOSIS — K8500 Idiopathic acute pancreatitis without necrosis or infection: Secondary | ICD-10-CM | POA: Diagnosis present

## 2021-01-26 DIAGNOSIS — K859 Acute pancreatitis without necrosis or infection, unspecified: Secondary | ICD-10-CM | POA: Diagnosis present

## 2021-01-26 DIAGNOSIS — Z20822 Contact with and (suspected) exposure to covid-19: Secondary | ICD-10-CM | POA: Diagnosis present

## 2021-01-26 DIAGNOSIS — Z794 Long term (current) use of insulin: Secondary | ICD-10-CM | POA: Diagnosis not present

## 2021-01-26 LAB — URINE CULTURE

## 2021-01-26 LAB — GLUCOSE, CAPILLARY
Glucose-Capillary: 113 mg/dL — ABNORMAL HIGH (ref 70–99)
Glucose-Capillary: 117 mg/dL — ABNORMAL HIGH (ref 70–99)
Glucose-Capillary: 123 mg/dL — ABNORMAL HIGH (ref 70–99)
Glucose-Capillary: 130 mg/dL — ABNORMAL HIGH (ref 70–99)
Glucose-Capillary: 133 mg/dL — ABNORMAL HIGH (ref 70–99)
Glucose-Capillary: 146 mg/dL — ABNORMAL HIGH (ref 70–99)

## 2021-01-26 LAB — CBC WITH DIFFERENTIAL/PLATELET
Abs Immature Granulocytes: 0.11 10*3/uL — ABNORMAL HIGH (ref 0.00–0.07)
Basophils Absolute: 0 10*3/uL (ref 0.0–0.1)
Basophils Relative: 0 %
Eosinophils Absolute: 0 10*3/uL (ref 0.0–0.5)
Eosinophils Relative: 0 %
HCT: 36 % (ref 36.0–46.0)
Hemoglobin: 11.2 g/dL — ABNORMAL LOW (ref 12.0–15.0)
Immature Granulocytes: 1 %
Lymphocytes Relative: 9 %
Lymphs Abs: 1.5 10*3/uL (ref 0.7–4.0)
MCH: 26.4 pg (ref 26.0–34.0)
MCHC: 31.1 g/dL (ref 30.0–36.0)
MCV: 84.9 fL (ref 80.0–100.0)
Monocytes Absolute: 1.6 10*3/uL — ABNORMAL HIGH (ref 0.1–1.0)
Monocytes Relative: 10 %
Neutro Abs: 12.4 10*3/uL — ABNORMAL HIGH (ref 1.7–7.7)
Neutrophils Relative %: 80 %
Platelets: 246 10*3/uL (ref 150–400)
RBC: 4.24 MIL/uL (ref 3.87–5.11)
RDW: 17.9 % — ABNORMAL HIGH (ref 11.5–15.5)
WBC: 15.6 10*3/uL — ABNORMAL HIGH (ref 4.0–10.5)
nRBC: 0 % (ref 0.0–0.2)

## 2021-01-26 LAB — COMPREHENSIVE METABOLIC PANEL
ALT: 13 U/L (ref 0–44)
AST: 19 U/L (ref 15–41)
Albumin: 3.3 g/dL — ABNORMAL LOW (ref 3.5–5.0)
Alkaline Phosphatase: 27 U/L — ABNORMAL LOW (ref 38–126)
Anion gap: 7 (ref 5–15)
BUN: 19 mg/dL (ref 8–23)
CO2: 21 mmol/L — ABNORMAL LOW (ref 22–32)
Calcium: 8.4 mg/dL — ABNORMAL LOW (ref 8.9–10.3)
Chloride: 110 mmol/L (ref 98–111)
Creatinine, Ser: 0.5 mg/dL (ref 0.44–1.00)
GFR, Estimated: >60 mL/min (ref >60)
Glucose, Bld: 133 mg/dL — ABNORMAL HIGH (ref 70–99)
Potassium: 4.1 mmol/L (ref 3.5–5.1)
Sodium: 138 mmol/L (ref 135–145)
Total Bilirubin: 0.7 mg/dL (ref 0.3–1.2)
Total Protein: 5.7 g/dL — ABNORMAL LOW (ref 6.5–8.1)

## 2021-01-26 LAB — MAGNESIUM: Magnesium: 1.7 mg/dL (ref 1.7–2.4)

## 2021-01-26 LAB — LIPASE, BLOOD: Lipase: 109 U/L — ABNORMAL HIGH (ref 11–51)

## 2021-01-26 MED ORDER — POLYETHYLENE GLYCOL 3350 17 G PO PACK
17.0000 g | PACK | Freq: Every day | ORAL | Status: DC
  Filled 2021-01-26 (×2): qty 1

## 2021-01-26 MED ORDER — SENNOSIDES-DOCUSATE SODIUM 8.6-50 MG PO TABS
1.0000 | ORAL_TABLET | Freq: Two times a day (BID) | ORAL | Status: DC
  Administered 2021-01-26 – 2021-01-28 (×5): 1 via ORAL
  Filled 2021-01-26 (×6): qty 1

## 2021-01-26 NOTE — Assessment & Plan Note (Addendum)
-   No ileus or obstruction noted on CT abdomen/pelvis on admission -Abdominal x-ray repeated on 01/27/2021.  Shows ongoing constipation -Bowel regimen started and she responded well on 01/27/2021 with having a bowel movement -Continue laxatives

## 2021-01-26 NOTE — Progress Notes (Signed)
PROGRESS NOTE    Molly [name]   DHR:[id number]  DOB: 07/11/1941  DOA: 01/25/2021     0  PCP: Idelle Crouch, MD  CC: abdominal pain, N/V  Hospital Course: [name] is a 80 yo female with PMH idiopathic pancreatitis, hypertension, hyperlipidemia, IBS, osteoporosis, GERD, fatty liver disease, DM II, arthritis who presented to the hospital with upper abdominal pain.  Her pain was described similar to prior episodes of pancreatitis flares.  She says she follows outpatient with gastroenterology and has had a work-up in the past regarding etiology and her episodes were considered idiopathic. She also had associated nausea/vomiting which is also typical for her from past flares.  Lipase was 1225 and there was elevated WBC and lactic acid.  She underwent CT abdomen/pelvis which showed acute pancreatitis, no pancreatic necrosis or ileus/obstruction. She was made NPO and started on IVF.    Interval History:  No events overnight.  Husband bedside in room this morning.  Pain and cramping slightly improved compared to yesterday but still present.  Having less flatus, no bowel movement.  Still no interest in initiating on liquid diet yet.  ROS: Constitutional: negative for chills and fevers, Respiratory: negative for cough, Cardiovascular: negative for chest pain and Gastrointestinal: positive for abdominal pain and nausea  Assessment & Plan: * Acute pancreatitis - lipase 1225 on admission; diagnosed with idiopathic recurrent pancreatitis -CT abdomen/pelvis performed on admission.  No necrosis/ileus/obstruction -Continue n.p.o., okay for ice chips/sips and medicines -Continue pain and nausea medication -Lipase significantly improved overnight. -Patient still has no appetite and not wishing to initiate any diet as of yet.  Still having abdominal pain/cramping.  Also having less flatus and still no bowel movement -See constipation  Constipation Today-No ileus or obstruction noted on CT  abdomen/pelvis on admission -Hypoactive bowel sounds appreciated -Will trial MiraLAX and Senokot  Hypertension - continue lisinopril and lopressor  Elevated lactic acid level - low suspicion for infection; likely hypoperfusion from GI losses - continue IVF - no need to trend at this time   Hyperlipidemia - TG 70 - continue statin  Diabetes ([place]) - continue SSI and CBGs  Fatty liver disease, nonalcoholic - LFTs WNL - trend CMP   Old records reviewed in assessment of this patient  Antimicrobials: n/a  DVT prophylaxis: enoxaparin (LOVENOX) injection 40 mg Start: 01/25/21 0515   Code Status:   Code Status: Full Code Family Communication: husband bedside  Disposition Plan: Status is: Inpatient  Remains inpatient appropriate because:IV treatments appropriate due to intensity of illness or inability to take PO and Inpatient level of care appropriate due to severity of illness   Dispo: The patient is from: Home              Anticipated d/c is to: Home              Patient currently is not medically stable to d/c.   Difficult to place patient No   Risk of unplanned readmission score:     Objective: Blood pressure (!) 133/58, pulse 65, temperature 98.5 F (36.9 C), temperature source Oral, resp. rate 18, height 5\' 4"  (1.626 m), weight 77.1 kg, SpO2 92 %.  Examination: General appearance: alert, cooperative and no distress Head: Normocephalic, without obvious abnormality, atraumatic Eyes: EOMI Lungs: clear to auscultation bilaterally Heart: regular rate and rhythm and S1, S2 normal Abdomen: Nonspecific tenderness to palpation throughout, no rebound or guarding.  Bowel sounds present Extremities: No edema Skin: mobility and turgor normal Neurologic: Grossly normal  Consultants:   n/a  Procedures:     Data Reviewed: I have personally reviewed following labs and imaging studies Results for orders placed or performed during the hospital encounter of 01/25/21  (from the past 24 hour(s))  Glucose, capillary     Status: Abnormal   Collection Time: 01/25/21  7:11 PM  Result Value Ref Range   Glucose-Capillary 129 (H) 70 - 99 mg/dL  Glucose, capillary     Status: Abnormal   Collection Time: 01/25/21  9:16 PM  Result Value Ref Range   Glucose-Capillary 124 (H) 70 - 99 mg/dL  Glucose, capillary     Status: Abnormal   Collection Time: 01/26/21 12:05 AM  Result Value Ref Range   Glucose-Capillary 117 (H) 70 - 99 mg/dL  Glucose, capillary     Status: Abnormal   Collection Time: 01/26/21  4:09 AM  Result Value Ref Range   Glucose-Capillary 133 (H) 70 - 99 mg/dL  CBC with Differential/Platelet     Status: Abnormal   Collection Time: 01/26/21  4:40 AM  Result Value Ref Range   WBC 15.6 (H) 4.0 - 10.5 K/uL   RBC 4.24 3.87 - 5.11 MIL/uL   Hemoglobin 11.2 (L) 12.0 - 15.0 g/dL   HCT 36.0 36.0 - 46.0 %   MCV 84.9 80.0 - 100.0 fL   MCH 26.4 26.0 - 34.0 pg   MCHC 31.1 30.0 - 36.0 g/dL   RDW 17.9 (H) 11.5 - 15.5 %   Platelets 246 150 - 400 K/uL   nRBC 0.0 0.0 - 0.2 %   Neutrophils Relative % 80 %   Neutro Abs 12.4 (H) 1.7 - 7.7 K/uL   Lymphocytes Relative 9 %   Lymphs Abs 1.5 0.7 - 4.0 K/uL   Monocytes Relative 10 %   Monocytes Absolute 1.6 (H) 0.1 - 1.0 K/uL   Eosinophils Relative 0 %   Eosinophils Absolute 0.0 0.0 - 0.5 K/uL   Basophils Relative 0 %   Basophils Absolute 0.0 0.0 - 0.1 K/uL   Immature Granulocytes 1 %   Abs Immature Granulocytes 0.11 (H) 0.00 - 0.07 K/uL  Comprehensive metabolic panel     Status: Abnormal   Collection Time: 01/26/21  4:40 AM  Result Value Ref Range   Sodium 138 135 - 145 mmol/L   Potassium 4.1 3.5 - 5.1 mmol/L   Chloride 110 98 - 111 mmol/L   CO2 21 (L) 22 - 32 mmol/L   Glucose, Bld 133 (H) 70 - 99 mg/dL   BUN 19 8 - 23 mg/dL   Creatinine, Ser 0.50 0.44 - 1.00 mg/dL   Calcium 8.4 (L) 8.9 - 10.3 mg/dL   Total Protein 5.7 (L) 6.5 - 8.1 g/dL   Albumin 3.3 (L) 3.5 - 5.0 g/dL   AST 19 15 - 41 U/L   ALT 13 0  - 44 U/L   Alkaline Phosphatase 27 (L) 38 - 126 U/L   Total Bilirubin 0.7 0.3 - 1.2 mg/dL   GFR, Estimated >60 >60 mL/min   Anion gap 7 5 - 15  Magnesium     Status: None   Collection Time: 01/26/21  4:40 AM  Result Value Ref Range   Magnesium 1.7 1.7 - 2.4 mg/dL  Lipase, blood     Status: Abnormal   Collection Time: 01/26/21  4:40 AM  Result Value Ref Range   Lipase 109 (H) 11 - 51 U/L  Glucose, capillary     Status: Abnormal   Collection Time: 01/26/21  7:45 AM  Result Value Ref Range   Glucose-Capillary 146 (H) 70 - 99 mg/dL   Comment 1 Notify RN    Comment 2 Document in Chart   Glucose, capillary     Status: Abnormal   Collection Time: 01/26/21 11:30 AM  Result Value Ref Range   Glucose-Capillary 130 (H) 70 - 99 mg/dL    Recent Results (from the past 240 hour(s))  Urine culture     Status: Abnormal   Collection Time: 01/25/21 12:31 AM   Specimen: In/Out Cath Urine  Result Value Ref Range Status   Specimen Description   Final    IN/OUT CATH URINE Performed at [facility], [address] [place], [place] [address]    Special Requests   Final    NONE Performed at [facility], [address] [place], [place] [address]    Culture MULTIPLE SPECIES PRESENT, SUGGEST RECOLLECTION (A)  Final   Report Status 01/26/2021 FINAL  Final  Resp Panel by RT-PCR (Flu A&B, Covid) Nasopharyngeal Swab     Status: None   Collection Time: 01/25/21  7:38 AM   Specimen: Nasopharyngeal Swab; Nasopharyngeal(NP) swabs in vial transport medium  Result Value Ref Range Status   SARS Coronavirus 2 by RT PCR NEGATIVE NEGATIVE Final    Comment: (NOTE) SARS-CoV-2 target nucleic acids are NOT DETECTED.  The SARS-CoV-2 RNA is generally detectable in upper respiratory specimens during the acute phase of infection. The lowest concentration of SARS-CoV-2 viral copies this assay can detect is 138 copies/mL. A negative result does not preclude SARS-Cov-2 infection and should not be  used as the sole basis for treatment or other patient management decisions. A negative result may occur with  improper specimen collection/handling, submission of specimen other than nasopharyngeal swab, presence of viral mutation(s) within the areas targeted by this assay, and inadequate number of viral copies(<138 copies/mL). A negative result must be combined with clinical observations, patient history, and epidemiological information. The expected result is Negative.  Fact Sheet for Patients:  EntrepreneurPulse.com.au  Fact Sheet for Healthcare Providers:  IncredibleEmployment.be  This test is no t yet approved or cleared by the [place] FDA and  has been authorized for detection and/or diagnosis of SARS-CoV-2 by FDA under an Emergency Use Authorization (EUA). This EUA will remain  in effect (meaning this test can be used) for the duration of the COVID-19 declaration under Section 564(b)(1) of the Act, 21 U.S.C.section 360bbb-3(b)(1), unless the authorization is terminated  or revoked sooner.       Influenza A by PCR NEGATIVE NEGATIVE Final   Influenza B by PCR NEGATIVE NEGATIVE Final    Comment: (NOTE) The Xpert Xpress SARS-CoV-2/FLU/RSV plus assay is intended as an aid in the diagnosis of influenza from Nasopharyngeal swab specimens and should not be used as a sole basis for treatment. Nasal washings and aspirates are unacceptable for Xpert Xpress SARS-CoV-2/FLU/RSV testing.  Fact Sheet for Patients: EntrepreneurPulse.com.au  Fact Sheet for Healthcare Providers: IncredibleEmployment.be  This test is not yet approved or cleared by the [place] FDA and has been authorized for detection and/or diagnosis of SARS-CoV-2 by FDA under an Emergency Use Authorization (EUA). This EUA will remain in effect (meaning this test can be used) for the duration of the COVID-19 declaration under Section  564(b)(1) of the Act, 21 U.S.C. section 360bbb-3(b)(1), unless the authorization is terminated or revoked.  Performed at [facility], [address], [place], [place] [address]      Radiology Studies:  CT ABDOMEN PELVIS W CONTRAST  Result Date: 01/25/2021 CLINICAL DATA:  80 year old female with nausea, vomiting, abdominal pain. Prior history of pancreatitis. EXAM: CT ABDOMEN AND PELVIS WITH CONTRAST TECHNIQUE: Multidetector CT imaging of the abdomen and pelvis was performed using the standard protocol following bolus administration of intravenous contrast. CONTRAST:  169mL OMNIPAQUE IOHEXOL 300 MG/ML  SOLN COMPARISON:  CT Abdomen and Pelvis 08/09/2019 and earlier. FINDINGS: Lower chest: Chronic lung base scarring and/or atelectasis is stable since 2020. Calcified granuloma partially visible in the superior segment right lower lobe. Calcified coronary artery atherosclerosis. No pericardial or pleural effusion. Hepatobiliary: Stable liver and gallbladder. Small volume perihepatic free fluid related to the pancreas. No biliary ductal enlargement. Pancreas: Confluent peripancreatic inflammation. Inflammation throughout the lesser sac and small volume of free fluid scattered in the upper abdomen. Partial pancreatic atrophy. But pancreatic enhancement appears preserved, no discrete pancreatic necrosis identified. No ductal dilatation. No discrete pancreatic lesion. Spleen: Negative aside from mild adjacent inflammation, small volume free fluid. Adrenals/Urinary Tract: Normal adrenal glands. Stable kidneys, with chronic small benign renal cysts, symmetric renal enhancement and contrast excretion. Negative urinary bladder. Incidental numerous pelvic phleboliths. Stomach/Bowel: Decompressed large bowel from the hepatic flexure distally. Normal appendix (coronal image 58). Fluid-filled but nondilated distal small bowel. Mostly decompressed jejunum. Inflammation in the lesser sac with mild secondary  involvement of the stomach and moderate involvement of the duodenum and root of the small bowel mesentery. No free air. Vascular/Lymphatic: Extensive Aortoiliac calcified atherosclerosis. Major arterial structures in the abdomen and pelvis are patent. Portal venous system including the splenic vein remains patent. No lymphadenopathy. Reproductive: Surgically absent uterus as before.  Negative ovaries. Other: No pelvic free fluid. Musculoskeletal: No acute osseous abnormality identified. IMPRESSION: 1. Acute Pancreatitis. Inflammation in the upper abdomen with secondary involvement of the duodenum. Small volume free fluid but no organized or drainable fluid collection at this time. No pancreatic necrosis or other adverse features. 2. Aortic Atherosclerosis (ICD10-I70.0). Electronically Signed   By: Genevie Ann M.D.   On: 01/25/2021 04:29   DG Chest Port 1 View  Result Date: 01/25/2021 CLINICAL DATA:  Possible sepsis EXAM: PORTABLE CHEST 1 VIEW COMPARISON:  08/09/2019 FINDINGS: Mild cardiomegaly with vascular congestion and diffuse interstitial opacity likely mild pulmonary edema. Mitral annular calcification. No pleural effusion or pneumothorax. Aortic atherosclerosis. IMPRESSION: Cardiomegaly with vascular congestion and mild pulmonary edema. Electronically Signed   By: Donavan Foil M.D.   On: 01/25/2021 00:46   CT ABDOMEN PELVIS W CONTRAST  Final Result    DG Chest Port 1 View  Final Result      Scheduled Meds: . enoxaparin (LOVENOX) injection  40 mg Subcutaneous Q24H  . insulin aspart  0-15 Units Subcutaneous Q4H  . lisinopril  20 mg Oral Daily  . metoprolol tartrate  50 mg Oral BID  . polyethylene glycol  17 g Oral Daily  . senna-docusate  1 tablet Oral BID   PRN Meds: acetaminophen **OR** acetaminophen, hydrALAZINE, HYDROmorphone (DILAUDID) injection, labetalol, ondansetron **OR** ondansetron (ZOFRAN) IV, promethazine (PHENERGAN) injection Continuous Infusions: . sodium chloride 100 mL/hr at  01/26/21 1245  . promethazine (PHENERGAN) injection Stopped (01/25/21 1318)     LOS: 0 days  Time spent: Greater than 50% of the 35 minute visit was spent in counseling/coordination of care for the patient as laid out in the A&P.   Dwyane Dee, MD [facility] 01/26/2021, 3:10 PM

## 2021-01-27 ENCOUNTER — Inpatient Hospital Stay: Payer: HMO

## 2021-01-27 LAB — COMPREHENSIVE METABOLIC PANEL
ALT: 11 U/L (ref 0–44)
AST: 13 U/L — ABNORMAL LOW (ref 15–41)
Albumin: 2.9 g/dL — ABNORMAL LOW (ref 3.5–5.0)
Alkaline Phosphatase: 26 U/L — ABNORMAL LOW (ref 38–126)
Anion gap: 8 (ref 5–15)
BUN: 18 mg/dL (ref 8–23)
CO2: 22 mmol/L (ref 22–32)
Calcium: 7.8 mg/dL — ABNORMAL LOW (ref 8.9–10.3)
Chloride: 108 mmol/L (ref 98–111)
Creatinine, Ser: 0.49 mg/dL (ref 0.44–1.00)
GFR, Estimated: >60 mL/min (ref >60)
Glucose, Bld: 139 mg/dL — ABNORMAL HIGH (ref 70–99)
Potassium: 3.2 mmol/L — ABNORMAL LOW (ref 3.5–5.1)
Sodium: 138 mmol/L (ref 135–145)
Total Bilirubin: 0.8 mg/dL (ref 0.3–1.2)
Total Protein: 5.4 g/dL — ABNORMAL LOW (ref 6.5–8.1)

## 2021-01-27 LAB — CBC WITH DIFFERENTIAL/PLATELET
Abs Immature Granulocytes: 0.07 10*3/uL (ref 0.00–0.07)
Basophils Absolute: 0 10*3/uL (ref 0.0–0.1)
Basophils Relative: 0 %
Eosinophils Absolute: 0.2 10*3/uL (ref 0.0–0.5)
Eosinophils Relative: 1 %
HCT: 30.5 % — ABNORMAL LOW (ref 36.0–46.0)
Hemoglobin: 9.9 g/dL — ABNORMAL LOW (ref 12.0–15.0)
Immature Granulocytes: 1 %
Lymphocytes Relative: 12 %
Lymphs Abs: 1.5 10*3/uL (ref 0.7–4.0)
MCH: 26.8 pg (ref 26.0–34.0)
MCHC: 32.5 g/dL (ref 30.0–36.0)
MCV: 82.7 fL (ref 80.0–100.0)
Monocytes Absolute: 1.2 10*3/uL — ABNORMAL HIGH (ref 0.1–1.0)
Monocytes Relative: 10 %
Neutro Abs: 9.7 10*3/uL — ABNORMAL HIGH (ref 1.7–7.7)
Neutrophils Relative %: 76 %
Platelets: 206 10*3/uL (ref 150–400)
RBC: 3.69 MIL/uL — ABNORMAL LOW (ref 3.87–5.11)
RDW: 17.5 % — ABNORMAL HIGH (ref 11.5–15.5)
WBC: 12.7 10*3/uL — ABNORMAL HIGH (ref 4.0–10.5)
nRBC: 0 % (ref 0.0–0.2)

## 2021-01-27 LAB — GLUCOSE, CAPILLARY
Glucose-Capillary: 117 mg/dL — ABNORMAL HIGH (ref 70–99)
Glucose-Capillary: 124 mg/dL — ABNORMAL HIGH (ref 70–99)
Glucose-Capillary: 127 mg/dL — ABNORMAL HIGH (ref 70–99)
Glucose-Capillary: 130 mg/dL — ABNORMAL HIGH (ref 70–99)
Glucose-Capillary: 132 mg/dL — ABNORMAL HIGH (ref 70–99)
Glucose-Capillary: 155 mg/dL — ABNORMAL HIGH (ref 70–99)
Glucose-Capillary: 164 mg/dL — ABNORMAL HIGH (ref 70–99)

## 2021-01-27 LAB — MAGNESIUM: Magnesium: 1.7 mg/dL (ref 1.7–2.4)

## 2021-01-27 LAB — LIPASE, BLOOD: Lipase: 29 U/L (ref 11–51)

## 2021-01-27 MED ORDER — POTASSIUM CHLORIDE CRYS ER 20 MEQ PO TBCR
40.0000 meq | EXTENDED_RELEASE_TABLET | ORAL | Status: AC
  Administered 2021-01-27 (×2): 40 meq via ORAL
  Filled 2021-01-27 (×2): qty 2

## 2021-01-27 MED ORDER — OXYCODONE HCL 5 MG PO TABS
5.0000 mg | ORAL_TABLET | ORAL | Status: DC | PRN
  Administered 2021-01-27 – 2021-01-29 (×9): 5 mg via ORAL
  Filled 2021-01-27 (×10): qty 1

## 2021-01-27 MED ORDER — MAGNESIUM OXIDE 400 (241.3 MG) MG PO TABS
800.0000 mg | ORAL_TABLET | Freq: Once | ORAL | Status: AC
  Administered 2021-01-27: 800 mg via ORAL
  Filled 2021-01-27: qty 2

## 2021-01-27 MED ORDER — HYDROMORPHONE HCL 1 MG/ML IJ SOLN
0.5000 mg | INTRAMUSCULAR | Status: DC | PRN
  Administered 2021-01-27: 0.5 mg via INTRAVENOUS
  Filled 2021-01-27: qty 0.5

## 2021-01-27 MED ORDER — LACTULOSE 10 GM/15ML PO SOLN
30.0000 g | Freq: Two times a day (BID) | ORAL | Status: DC
  Administered 2021-01-27 – 2021-01-29 (×2): 30 g via ORAL
  Filled 2021-01-27 (×4): qty 60

## 2021-01-27 NOTE — Progress Notes (Signed)
PROGRESS NOTE    Molly [name]   HCW:[id number]  DOB: Jun 09, 1941  DOA: 01/25/2021     1  PCP: Idelle Crouch, MD  CC: abdominal pain, N/V  Hospital Course: Molly [name] is a 80 yo female with PMH idiopathic pancreatitis, hypertension, hyperlipidemia, IBS, osteoporosis, GERD, fatty liver disease, DM II, arthritis who presented to the hospital with upper abdominal pain.  Her pain was described similar to prior episodes of pancreatitis flares.  She says she follows outpatient with gastroenterology and has had a work-up in the past regarding etiology and her episodes were considered idiopathic. She also had associated nausea/vomiting which is also typical for her from past flares.  Lipase was 1225 and there was elevated WBC and lactic acid.  She underwent CT abdomen/pelvis which showed acute pancreatitis, no pancreatic necrosis or ileus/obstruction. She was made NPO and started on IVF. Her lipase normalized and abdominal xray showed constipation. She was started on a bowel regimen and a CLD on 3/20 for trial.    Interval History:  No events overnight.  Husband again present bedside this morning.  Reviewed labs.  Discussed we would obtain abdominal x-ray and try to advance diet some today.  She denies any nausea/vomiting.  Still has ongoing abdominal pain but has been mildly improving since admission.  She endorses some flatus but still no bowel movement.  ROS: Constitutional: negative for chills and fevers, Respiratory: negative for cough, Cardiovascular: negative for chest pain and Gastrointestinal: positive for abdominal pain and nausea  Assessment & Plan: * Acute pancreatitis - lipase 1225 on admission; diagnosed with idiopathic recurrent pancreatitis -CT abdomen/pelvis performed on admission.  No necrosis/ileus/obstruction -Continue n.p.o., okay for ice chips/sips and medicines -Continue pain and nausea medication -Lipase significantly improved overnight. Normalized on 3/20.  -Abdominal  x-ray obtained on 01/27/2021: Shows underlying constipation with stool burden.  Continue laxative regimen -Start on clear liquid diet for trial today to see how she tolerates  Constipation Today-No ileus or obstruction noted on CT abdomen/pelvis on admission -Abdominal x-ray repeated on 01/27/2021.  Shows ongoing constipation -Continue bowel regimen -She refused the MiraLAX.  Will try lactulose instead -Continue offering MiraLAX and continue Senokot  Hypertension - continue lisinopril and lopressor  Hyperlipidemia - TG 70 - continue statin  Diabetes ([place]) - continue SSI and CBGs  Fatty liver disease, nonalcoholic - LFTs WNL - trend CMP  Elevated lactic acid level-resolved as of 01/27/2021 - low suspicion for infection; likely hypoperfusion from GI losses - continue IVF - no need to trend at this time    Old records reviewed in assessment of this patient  Antimicrobials: n/a  DVT prophylaxis: enoxaparin (LOVENOX) injection 40 mg Start: 01/25/21 0515   Code Status:   Code Status: Full Code Family Communication: husband bedside  Disposition Plan: Status is: Inpatient  Remains inpatient appropriate because:IV treatments appropriate due to intensity of illness or inability to take PO and Inpatient level of care appropriate due to severity of illness   Dispo: The patient is from: Home              Anticipated d/c is to: Home              Patient currently is not medically stable to d/c.   Difficult to place patient No   Risk of unplanned readmission score: Unplanned Admission- Pilot do not use: 12.24   Objective: Blood pressure (!) 137/92, pulse 62, temperature 98.4 F (36.9 C), temperature source Oral, resp. rate 18, height 5\' 4"  (  1.626 m), weight 77.1 kg, SpO2 97 %.  Examination: General appearance: alert, cooperative and no distress Head: Normocephalic, without obvious abnormality, atraumatic Eyes: EOMI Lungs: clear to auscultation bilaterally Heart: regular  rate and rhythm and S1, S2 normal Abdomen: Nonspecific tenderness to palpation throughout, no rebound or guarding.  Bowel sounds are improved today Extremities: No edema Skin: mobility and turgor normal Neurologic: Grossly normal  Consultants:   n/a  Procedures:     Data Reviewed: I have personally reviewed following labs and imaging studies Results for orders placed or performed during the hospital encounter of 01/25/21 (from the past 24 hour(s))  Glucose, capillary     Status: Abnormal   Collection Time: 01/26/21  4:34 PM  Result Value Ref Range   Glucose-Capillary 113 (H) 70 - 99 mg/dL   Comment 1 Notify RN    Comment 2 Document in Chart   Glucose, capillary     Status: Abnormal   Collection Time: 01/26/21  7:50 PM  Result Value Ref Range   Glucose-Capillary 123 (H) 70 - 99 mg/dL  Glucose, capillary     Status: Abnormal   Collection Time: 01/27/21 12:21 AM  Result Value Ref Range   Glucose-Capillary 117 (H) 70 - 99 mg/dL  Glucose, capillary     Status: Abnormal   Collection Time: 01/27/21  4:27 AM  Result Value Ref Range   Glucose-Capillary 130 (H) 70 - 99 mg/dL  CBC with Differential/Platelet     Status: Abnormal   Collection Time: 01/27/21  5:16 AM  Result Value Ref Range   WBC 12.7 (H) 4.0 - 10.5 K/uL   RBC 3.69 (L) 3.87 - 5.11 MIL/uL   Hemoglobin 9.9 (L) 12.0 - 15.0 g/dL   HCT 30.5 (L) 36.0 - 46.0 %   MCV 82.7 80.0 - 100.0 fL   MCH 26.8 26.0 - 34.0 pg   MCHC 32.5 30.0 - 36.0 g/dL   RDW 17.5 (H) 11.5 - 15.5 %   Platelets 206 150 - 400 K/uL   nRBC 0.0 0.0 - 0.2 %   Neutrophils Relative % 76 %   Neutro Abs 9.7 (H) 1.7 - 7.7 K/uL   Lymphocytes Relative 12 %   Lymphs Abs 1.5 0.7 - 4.0 K/uL   Monocytes Relative 10 %   Monocytes Absolute 1.2 (H) 0.1 - 1.0 K/uL   Eosinophils Relative 1 %   Eosinophils Absolute 0.2 0.0 - 0.5 K/uL   Basophils Relative 0 %   Basophils Absolute 0.0 0.0 - 0.1 K/uL   Immature Granulocytes 1 %   Abs Immature Granulocytes 0.07 0.00 -  0.07 K/uL  Comprehensive metabolic panel     Status: Abnormal   Collection Time: 01/27/21  5:16 AM  Result Value Ref Range   Sodium 138 135 - 145 mmol/L   Potassium 3.2 (L) 3.5 - 5.1 mmol/L   Chloride 108 98 - 111 mmol/L   CO2 22 22 - 32 mmol/L   Glucose, Bld 139 (H) 70 - 99 mg/dL   BUN 18 8 - 23 mg/dL   Creatinine, Ser 0.49 0.44 - 1.00 mg/dL   Calcium 7.8 (L) 8.9 - 10.3 mg/dL   Total Protein 5.4 (L) 6.5 - 8.1 g/dL   Albumin 2.9 (L) 3.5 - 5.0 g/dL   AST 13 (L) 15 - 41 U/L   ALT 11 0 - 44 U/L   Alkaline Phosphatase 26 (L) 38 - 126 U/L   Total Bilirubin 0.8 0.3 - 1.2 mg/dL   GFR, Estimated >60 >60  mL/min   Anion gap 8 5 - 15  Magnesium     Status: None   Collection Time: 01/27/21  5:16 AM  Result Value Ref Range   Magnesium 1.7 1.7 - 2.4 mg/dL  Lipase, blood     Status: None   Collection Time: 01/27/21  5:16 AM  Result Value Ref Range   Lipase 29 11 - 51 U/L  Glucose, capillary     Status: Abnormal   Collection Time: 01/27/21  7:36 AM  Result Value Ref Range   Glucose-Capillary 127 (H) 70 - 99 mg/dL  Glucose, capillary     Status: Abnormal   Collection Time: 01/27/21 11:25 AM  Result Value Ref Range   Glucose-Capillary 124 (H) 70 - 99 mg/dL    Recent Results (from the past 240 hour(s))  Urine culture     Status: Abnormal   Collection Time: 01/25/21 12:31 AM   Specimen: In/Out Cath Urine  Result Value Ref Range Status   Specimen Description   Final    IN/OUT CATH URINE Performed at [facility], [address] [place], [place] [address]    Special Requests   Final    NONE Performed at [facility], [address] [place], [place] [address]    Culture MULTIPLE SPECIES PRESENT, SUGGEST RECOLLECTION (A)  Final   Report Status 01/26/2021 FINAL  Final  Resp Panel by RT-PCR (Flu A&B, Covid) Nasopharyngeal Swab     Status: None   Collection Time: 01/25/21  7:38 AM   Specimen: Nasopharyngeal Swab; Nasopharyngeal(NP) swabs in vial transport medium   Result Value Ref Range Status   SARS Coronavirus 2 by RT PCR NEGATIVE NEGATIVE Final    Comment: (NOTE) SARS-CoV-2 target nucleic acids are NOT DETECTED.  The SARS-CoV-2 RNA is generally detectable in upper respiratory specimens during the acute phase of infection. The lowest concentration of SARS-CoV-2 viral copies this assay can detect is 138 copies/mL. A negative result does not preclude SARS-Cov-2 infection and should not be used as the sole basis for treatment or other patient management decisions. A negative result may occur with  improper specimen collection/handling, submission of specimen other than nasopharyngeal swab, presence of viral mutation(s) within the areas targeted by this assay, and inadequate number of viral copies(<138 copies/mL). A negative result must be combined with clinical observations, patient history, and epidemiological information. The expected result is Negative.  Fact Sheet for Patients:  EntrepreneurPulse.com.au  Fact Sheet for Healthcare Providers:  IncredibleEmployment.be  This test is no t yet approved or cleared by the [place] FDA and  has been authorized for detection and/or diagnosis of SARS-CoV-2 by FDA under an Emergency Use Authorization (EUA). This EUA will remain  in effect (meaning this test can be used) for the duration of the COVID-19 declaration under Section 564(b)(1) of the Act, 21 U.S.C.section 360bbb-3(b)(1), unless the authorization is terminated  or revoked sooner.       Influenza A by PCR NEGATIVE NEGATIVE Final   Influenza B by PCR NEGATIVE NEGATIVE Final    Comment: (NOTE) The Xpert Xpress SARS-CoV-2/FLU/RSV plus assay is intended as an aid in the diagnosis of influenza from Nasopharyngeal swab specimens and should not be used as a sole basis for treatment. Nasal washings and aspirates are unacceptable for Xpert Xpress SARS-CoV-2/FLU/RSV testing.  Fact Sheet for  Patients: EntrepreneurPulse.com.au  Fact Sheet for Healthcare Providers: IncredibleEmployment.be  This test is not yet approved or cleared by the [place] FDA and has been authorized for detection  and/or diagnosis of SARS-CoV-2 by FDA under an Emergency Use Authorization (EUA). This EUA will remain in effect (meaning this test can be used) for the duration of the COVID-19 declaration under Section 564(b)(1) of the Act, 21 U.S.C. section 360bbb-3(b)(1), unless the authorization is terminated or revoked.  Performed at [facility], [address], [place], [place] [address]      Radiology Studies: No results found. CT ABDOMEN PELVIS W CONTRAST  Final Result    DG Chest Port 1 View  Final Result    DG Abd Portable 1V    (Results Pending)    Scheduled Meds: . enoxaparin (LOVENOX) injection  40 mg Subcutaneous Q24H  . insulin aspart  0-15 Units Subcutaneous Q4H  . lactulose  30 g Oral BID  . lisinopril  20 mg Oral Daily  . metoprolol tartrate  50 mg Oral BID  . polyethylene glycol  17 g Oral Daily  . potassium chloride  40 mEq Oral Q4H  . senna-docusate  1 tablet Oral BID   PRN Meds: acetaminophen **OR** acetaminophen, hydrALAZINE, HYDROmorphone (DILAUDID) injection, labetalol, ondansetron **OR** ondansetron (ZOFRAN) IV, oxyCODONE, promethazine (PHENERGAN) injection Continuous Infusions: . sodium chloride 100 mL/hr at 01/27/21 0811  . promethazine (PHENERGAN) injection Stopped (01/25/21 1318)     LOS: 1 day  Time spent: Greater than 50% of the 35 minute visit was spent in counseling/coordination of care for the patient as laid out in the A&P.   Dwyane Dee, MD [facility] 01/27/2021, 12:51 PM

## 2021-01-28 LAB — COMPREHENSIVE METABOLIC PANEL
ALT: 11 U/L (ref 0–44)
AST: 12 U/L — ABNORMAL LOW (ref 15–41)
Albumin: 2.7 g/dL — ABNORMAL LOW (ref 3.5–5.0)
Alkaline Phosphatase: 29 U/L — ABNORMAL LOW (ref 38–126)
Anion gap: 4 — ABNORMAL LOW (ref 5–15)
BUN: 13 mg/dL (ref 8–23)
CO2: 22 mmol/L (ref 22–32)
Calcium: 7.4 mg/dL — ABNORMAL LOW (ref 8.9–10.3)
Chloride: 112 mmol/L — ABNORMAL HIGH (ref 98–111)
Creatinine, Ser: 0.5 mg/dL (ref 0.44–1.00)
GFR, Estimated: >60 mL/min (ref >60)
Glucose, Bld: 130 mg/dL — ABNORMAL HIGH (ref 70–99)
Potassium: 3.3 mmol/L — ABNORMAL LOW (ref 3.5–5.1)
Sodium: 138 mmol/L (ref 135–145)
Total Bilirubin: 0.8 mg/dL (ref 0.3–1.2)
Total Protein: 5.2 g/dL — ABNORMAL LOW (ref 6.5–8.1)

## 2021-01-28 LAB — CBC WITH DIFFERENTIAL/PLATELET
Abs Immature Granulocytes: 0.04 10*3/uL (ref 0.00–0.07)
Basophils Absolute: 0 10*3/uL (ref 0.0–0.1)
Basophils Relative: 0 %
Eosinophils Absolute: 0.2 10*3/uL (ref 0.0–0.5)
Eosinophils Relative: 2 %
HCT: 29.1 % — ABNORMAL LOW (ref 36.0–46.0)
Hemoglobin: 9 g/dL — ABNORMAL LOW (ref 12.0–15.0)
Immature Granulocytes: 0 %
Lymphocytes Relative: 15 %
Lymphs Abs: 1.4 10*3/uL (ref 0.7–4.0)
MCH: 25.9 pg — ABNORMAL LOW (ref 26.0–34.0)
MCHC: 30.9 g/dL (ref 30.0–36.0)
MCV: 83.6 fL (ref 80.0–100.0)
Monocytes Absolute: 1 10*3/uL (ref 0.1–1.0)
Monocytes Relative: 10 %
Neutro Abs: 7 10*3/uL (ref 1.7–7.7)
Neutrophils Relative %: 73 %
Platelets: 181 10*3/uL (ref 150–400)
RBC: 3.48 MIL/uL — ABNORMAL LOW (ref 3.87–5.11)
RDW: 17.3 % — ABNORMAL HIGH (ref 11.5–15.5)
WBC: 9.7 10*3/uL (ref 4.0–10.5)
nRBC: 0 % (ref 0.0–0.2)

## 2021-01-28 LAB — MAGNESIUM: Magnesium: 2.1 mg/dL (ref 1.7–2.4)

## 2021-01-28 LAB — GLUCOSE, CAPILLARY
Glucose-Capillary: 146 mg/dL — ABNORMAL HIGH (ref 70–99)
Glucose-Capillary: 136 mg/dL — ABNORMAL HIGH (ref 70–99)
Glucose-Capillary: 169 mg/dL — ABNORMAL HIGH (ref 70–99)
Glucose-Capillary: 134 mg/dL — ABNORMAL HIGH (ref 70–99)

## 2021-01-28 MED ORDER — POTASSIUM CHLORIDE CRYS ER 20 MEQ PO TBCR
40.0000 meq | EXTENDED_RELEASE_TABLET | Freq: Once | ORAL | Status: AC
  Administered 2021-01-28: 40 meq via ORAL
  Filled 2021-01-28: qty 2

## 2021-01-28 MED ORDER — INSULIN ASPART 100 UNIT/ML ~~LOC~~ SOLN
0.0000 [IU] | Freq: Three times a day (TID) | SUBCUTANEOUS | Status: DC
  Administered 2021-01-28: 2 [IU] via SUBCUTANEOUS
  Administered 2021-01-29: 5 [IU] via SUBCUTANEOUS
  Administered 2021-01-29: 2 [IU] via SUBCUTANEOUS
  Administered 2021-01-30: 3 [IU] via SUBCUTANEOUS
  Filled 2021-01-28 (×4): qty 1

## 2021-01-28 NOTE — Evaluation (Signed)
Physical Therapy Evaluation Patient Details Name: Molly [name] MRN: [id number] DOB: 05/17/1941 Today's Date: 01/28/2021   History of Present Illness  Pt is a 80 y/o F admitted from home on 01/25/21 with c/c of upper abdominal pain, N&V. CT reveals acute pancreatitis without necrosis. PMH: IDDM, HLD, nonalcoholic fatty liver disease, recurrent idiopathic pancreatitis, arrhythmia, arthritis, skin CA, CMV, duodenitis, GERD, heart murmur, IBS, osteoporosis  Clinical Impression  Pt seen for PT with husband present for session. Pt is able to complete bed mobility with HOB elevated & supervision/mod I & requires cuing for hand placement for transfers with supervision. Pt is able to ambulate without AD but requires min assist 2/2 unsteadiness on feet, is able to ambulate 75 ft further with RW & supervision. PT educates pt & husband on need for pt to use RW for mobility at this time to reduce her fall risk. Pt on room air throughout with desaturation to 85% with gait but pt without symptoms of SOB, pt is able to recover with seated rest break but does note worsening/different pain in R substernal area with deep pursed lip breathing (nurse notified). Pt is able to complete BLE strengthening exercises with instructional cuing for technique. Pt would benefit from acute PT services to progress gait with LRAD, focus on balance training & endurance training.     Follow Up Recommendations Home health PT;Supervision for mobility/OOB    Equipment Recommendations  None recommended by PT (pt already has RW)    Recommendations for Other Services       Precautions / Restrictions Precautions Precautions: Fall Restrictions Weight Bearing Restrictions: No      Mobility  Bed Mobility Overal bed mobility: Needs Assistance Bed Mobility: Supine to Sit;Sit to Supine     Supine to sit: Modified independent (Device/Increase time);HOB elevated Sit to supine: Supervision;HOB elevated        Transfers Overall  transfer level: Needs assistance   Transfers: Sit to/from Stand Sit to Stand: Supervision         General transfer comment: cuing for hand placement to push to standing vs pulling up on RW  Ambulation/Gait Ambulation/Gait assistance: Min assist;Supervision Gait Distance (Feet): 15 Feet (+ 75 ft) Assistive device: None;Rolling walker (2 wheeled)       General Gait Details: Pt demonstrates decreased step length BLE & impaired balance requiring min assist for gait without AD, but supervision when using RW.  Stairs            Wheelchair Mobility    Modified Rankin (Stroke Patients Only)       Balance Overall balance assessment: Needs assistance Sitting-balance support: Feet supported;No upper extremity supported Sitting balance-Leahy Scale: Good     Standing balance support: No upper extremity supported Standing balance-Leahy Scale: Fair                               Pertinent Vitals/Pain Pain Assessment: Faces Faces Pain Scale: Hurts whole lot Pain Location: substernal on the R side, increased pain with deep breathing Pain Descriptors / Indicators: Discomfort Pain Intervention(s): Monitored during session (pt has already requested pain meds)    Home Living Family/patient expects to be discharged to:: Private residence Living Arrangements: Spouse/significant other Available Help at Discharge: Family;Available 24 hours/day Type of Home: House Home Access: Stairs to enter Entrance Stairs-Rails: Right;Left (wideset) Entrance Stairs-Number of Steps: 2 Home Layout: One level Home Equipment: Cane - single point;Walker - 2 wheels  Prior Function           Comments: Independent without AD, no falls in past 6 months. Cooks.     Hand Dominance        Extremity/Trunk Assessment   Upper Extremity Assessment Upper Extremity Assessment: Overall WFL for tasks assessed    Lower Extremity Assessment Lower Extremity Assessment: Generalized  weakness       Communication   Communication: No difficulties  Cognition Arousal/Alertness: Awake/alert Behavior During Therapy: WFL for tasks assessed/performed Overall Cognitive Status: Within Functional Limits for tasks assessed                                        General Comments General comments (skin integrity, edema, etc.): Pt on room air during session, dropping to 85% during gait with pt denying feeling SOB, increases to 90% or > with seated rest break & pursed lip breathing. Drops to 88% during standing exercises but again recovers to max of 94% when sitting/resting. Pt c/o increased/worse pain in R substernal area when performing deep pursed lip breathing - nurse made aware.    Exercises General Exercises - Lower Extremity Long Arc Quad: AROM;Strengthening;Both;10 reps;Seated Hip ABduction/ADduction: AROM;Strengthening;Both;10 reps;Seated (hip adduction pillow squeezes) Hip Flexion/Marching: AROM;Strengthening;Both;10 reps;Standing (BUE support on RW)   Assessment/Plan    PT Assessment Patient needs continued PT services  PT Problem List Decreased strength;Decreased mobility;Decreased knowledge of precautions;Decreased activity tolerance;Cardiopulmonary status limiting activity;Pain;Decreased balance;Decreased knowledge of use of DME       PT Treatment Interventions DME instruction;Modalities;Therapeutic activities;Gait training;Therapeutic exercise;Patient/family education;Stair training;Balance training;Functional mobility training;Neuromuscular re-education;Manual techniques    PT Goals (Current goals can be found in the Care Plan section)  Acute Rehab PT Goals Patient Stated Goal: get better PT Goal Formulation: With patient/family Time For Goal Achievement: 02/11/21 Potential to Achieve Goals: Good    Frequency Min 2X/week   Barriers to discharge        Co-evaluation               AM-PAC PT "6 Clicks" Mobility  Outcome Measure  Help needed turning from your back to your side while in a flat bed without using bedrails?: None Help needed moving from lying on your back to sitting on the side of a flat bed without using bedrails?: A Little Help needed moving to and from a bed to a chair (including a wheelchair)?: A Little Help needed standing up from a chair using your arms (e.g., wheelchair or bedside chair)?: None Help needed to walk in hospital room?: A Little Help needed climbing 3-5 steps with a railing? : A Little 6 Click Score: 20    End of Session Equipment Utilized During Treatment: Gait belt Activity Tolerance: Patient tolerated treatment well Patient left: in bed;with call bell/phone within reach;with bed alarm set;with family/visitor present Nurse Communication: Mobility status (c/o pain, SPO2 during session) PT Visit Diagnosis: Muscle weakness (generalized) (M62.81);Unsteadiness on feet (R26.81)    Time: 3500-9381 PT Time Calculation (min) (ACUTE ONLY): 20 min   Charges:   PT Evaluation $PT Eval Low Complexity: 1 Low PT Treatments $Therapeutic Exercise: 8-22 mins        Lavone Nian, PT, DPT 01/28/21, 4:16 PM   Waunita Schooner 01/28/2021, 4:14 PM

## 2021-01-28 NOTE — Progress Notes (Signed)
PROGRESS NOTE    Molly [name]   OIB:[id number]  DOB: 1940-12-20  DOA: 01/25/2021     2  PCP: Idelle Crouch, MD  CC: abdominal pain, N/V  Hospital Course: Molly [name] is a 80 yo female with PMH idiopathic pancreatitis, hypertension, hyperlipidemia, IBS, osteoporosis, GERD, fatty liver disease, DM II, arthritis who presented to the hospital with upper abdominal pain.  Her pain was described similar to prior episodes of pancreatitis flares.  She says she follows outpatient with gastroenterology and has had a work-up in the past regarding etiology and her episodes were considered idiopathic. She also had associated nausea/vomiting which is also typical for her from past flares.  Lipase was 1225 and there was elevated WBC and lactic acid.  She underwent CT abdomen/pelvis which showed acute pancreatitis, no pancreatic necrosis or ileus/obstruction. She was made NPO and started on IVF. Her lipase normalized and abdominal xray showed constipation. She was started on a bowel regimen and a CLD on 3/20 for trial.  She tolerated clear liquids fairly well and plan was to increase further to full liquids on 01/28/2021.  She also had a bowel movement on 01/27/2021 after being started on laxatives as abdominal x-ray showed right-sided stool burden.   Interval History:  No events overnight.  Abdominal pain/soreness improving daily.  She had a bowel movement yesterday after laxatives.  Some soreness after clear liquids but overall tolerating, she was amenable to further advancing today to full liquids.  ROS: Constitutional: negative for chills and fevers, Respiratory: negative for cough, Cardiovascular: negative for chest pain and Gastrointestinal: positive for abdominal pain and nausea  Assessment & Plan: * Acute pancreatitis - lipase 1225 on admission; diagnosed with idiopathic recurrent pancreatitis -CT abdomen/pelvis performed on admission.  No necrosis/ileus/obstruction -Continue n.p.o., okay for ice  chips/sips and medicines -Continue pain and nausea medication -Lipase significantly improved overnight. Normalized on 3/20.  -Abdominal x-ray obtained on 01/27/2021: Shows underlying constipation with stool burden.  Continue laxative regimen -CLD tolerated; advance to FLD on 3/21  Constipation - No ileus or obstruction noted on CT abdomen/pelvis on admission -Abdominal x-ray repeated on 01/27/2021.  Shows ongoing constipation -Bowel regimen started and she responded well on 01/27/2021 with having a bowel movement -Continue laxatives  Hypertension - continue lisinopril and lopressor  Hyperlipidemia - TG 70 - continue statin  Diabetes ([place]) - continue SSI and CBGs  Fatty liver disease, nonalcoholic - LFTs WNL - trend CMP  Elevated lactic acid level-resolved as of 01/27/2021 - low suspicion for infection; likely hypoperfusion from GI losses - continue IVF - no need to trend at this time    Old records reviewed in assessment of this patient  Antimicrobials: n/a  DVT prophylaxis: enoxaparin (LOVENOX) injection 40 mg Start: 01/25/21 0515   Code Status:   Code Status: Full Code Family Communication: husband bedside  Disposition Plan: Status is: Inpatient  Remains inpatient appropriate because:IV treatments appropriate due to intensity of illness or inability to take PO and Inpatient level of care appropriate due to severity of illness   Dispo: The patient is from: Home              Anticipated d/c is to: Home              Patient currently is not medically stable to d/c.   Difficult to place patient No  Risk of unplanned readmission score: Unplanned Admission- Pilot do not use: 12.29   Objective: Blood pressure (!) 132/51, pulse 61, temperature 99 F (  37.2 C), temperature source Oral, resp. rate 16, height 5\' 4"  (1.626 m), weight 77.1 kg, SpO2 92 %.  Examination: General appearance: alert, cooperative and no distress Head: Normocephalic, without obvious abnormality,  atraumatic Eyes: EOMI Lungs: clear to auscultation bilaterally Heart: regular rate and rhythm and S1, S2 normal Abdomen: Nonspecific tenderness to palpation throughout, no rebound or guarding.  Bowel sounds are improved today Extremities: No edema Skin: mobility and turgor normal Neurologic: Grossly normal  Consultants:   n/a  Procedures:     Data Reviewed: I have personally reviewed following labs and imaging studies Results for orders placed or performed during the hospital encounter of 01/25/21 (from the past 24 hour(s))  Glucose, capillary     Status: Abnormal   Collection Time: 01/27/21  3:59 PM  Result Value Ref Range   Glucose-Capillary 164 (H) 70 - 99 mg/dL  Glucose, capillary     Status: Abnormal   Collection Time: 01/27/21  7:33 PM  Result Value Ref Range   Glucose-Capillary 155 (H) 70 - 99 mg/dL  Glucose, capillary     Status: Abnormal   Collection Time: 01/27/21 11:46 PM  Result Value Ref Range   Glucose-Capillary 132 (H) 70 - 99 mg/dL  Glucose, capillary     Status: Abnormal   Collection Time: 01/28/21  3:48 AM  Result Value Ref Range   Glucose-Capillary 146 (H) 70 - 99 mg/dL  CBC with Differential/Platelet     Status: Abnormal   Collection Time: 01/28/21  6:05 AM  Result Value Ref Range   WBC 9.7 4.0 - 10.5 K/uL   RBC 3.48 (L) 3.87 - 5.11 MIL/uL   Hemoglobin 9.0 (L) 12.0 - 15.0 g/dL   HCT 29.1 (L) 36.0 - 46.0 %   MCV 83.6 80.0 - 100.0 fL   MCH 25.9 (L) 26.0 - 34.0 pg   MCHC 30.9 30.0 - 36.0 g/dL   RDW 17.3 (H) 11.5 - 15.5 %   Platelets 181 150 - 400 K/uL   nRBC 0.0 0.0 - 0.2 %   Neutrophils Relative % 73 %   Neutro Abs 7.0 1.7 - 7.7 K/uL   Lymphocytes Relative 15 %   Lymphs Abs 1.4 0.7 - 4.0 K/uL   Monocytes Relative 10 %   Monocytes Absolute 1.0 0.1 - 1.0 K/uL   Eosinophils Relative 2 %   Eosinophils Absolute 0.2 0.0 - 0.5 K/uL   Basophils Relative 0 %   Basophils Absolute 0.0 0.0 - 0.1 K/uL   Immature Granulocytes 0 %   Abs Immature  Granulocytes 0.04 0.00 - 0.07 K/uL  Comprehensive metabolic panel     Status: Abnormal   Collection Time: 01/28/21  6:05 AM  Result Value Ref Range   Sodium 138 135 - 145 mmol/L   Potassium 3.3 (L) 3.5 - 5.1 mmol/L   Chloride 112 (H) 98 - 111 mmol/L   CO2 22 22 - 32 mmol/L   Glucose, Bld 130 (H) 70 - 99 mg/dL   BUN 13 8 - 23 mg/dL   Creatinine, Ser 0.50 0.44 - 1.00 mg/dL   Calcium 7.4 (L) 8.9 - 10.3 mg/dL   Total Protein 5.2 (L) 6.5 - 8.1 g/dL   Albumin 2.7 (L) 3.5 - 5.0 g/dL   AST 12 (L) 15 - 41 U/L   ALT 11 0 - 44 U/L   Alkaline Phosphatase 29 (L) 38 - 126 U/L   Total Bilirubin 0.8 0.3 - 1.2 mg/dL   GFR, Estimated >60 >60 mL/min   Anion gap  4 (L) 5 - 15  Magnesium     Status: None   Collection Time: 01/28/21  6:05 AM  Result Value Ref Range   Magnesium 2.1 1.7 - 2.4 mg/dL  Glucose, capillary     Status: Abnormal   Collection Time: 01/28/21  7:54 AM  Result Value Ref Range   Glucose-Capillary 136 (H) 70 - 99 mg/dL  Glucose, capillary     Status: Abnormal   Collection Time: 01/28/21 11:47 AM  Result Value Ref Range   Glucose-Capillary 169 (H) 70 - 99 mg/dL    Recent Results (from the past 240 hour(s))  Urine culture     Status: Abnormal   Collection Time: 01/25/21 12:31 AM   Specimen: In/Out Cath Urine  Result Value Ref Range Status   Specimen Description   Final    IN/OUT CATH URINE Performed at [facility], [address] [place], [place] [address]    Special Requests   Final    NONE Performed at [facility], [address] [place], [place] [address]    Culture MULTIPLE SPECIES PRESENT, SUGGEST RECOLLECTION (A)  Final   Report Status 01/26/2021 FINAL  Final  Resp Panel by RT-PCR (Flu A&B, Covid) Nasopharyngeal Swab     Status: None   Collection Time: 01/25/21  7:38 AM   Specimen: Nasopharyngeal Swab; Nasopharyngeal(NP) swabs in vial transport medium  Result Value Ref Range Status   SARS Coronavirus 2 by RT PCR NEGATIVE NEGATIVE Final     Comment: (NOTE) SARS-CoV-2 target nucleic acids are NOT DETECTED.  The SARS-CoV-2 RNA is generally detectable in upper respiratory specimens during the acute phase of infection. The lowest concentration of SARS-CoV-2 viral copies this assay can detect is 138 copies/mL. A negative result does not preclude SARS-Cov-2 infection and should not be used as the sole basis for treatment or other patient management decisions. A negative result may occur with  improper specimen collection/handling, submission of specimen other than nasopharyngeal swab, presence of viral mutation(s) within the areas targeted by this assay, and inadequate number of viral copies(<138 copies/mL). A negative result must be combined with clinical observations, patient history, and epidemiological information. The expected result is Negative.  Fact Sheet for Patients:  EntrepreneurPulse.com.au  Fact Sheet for Healthcare Providers:  IncredibleEmployment.be  This test is no t yet approved or cleared by the [place] FDA and  has been authorized for detection and/or diagnosis of SARS-CoV-2 by FDA under an Emergency Use Authorization (EUA). This EUA will remain  in effect (meaning this test can be used) for the duration of the COVID-19 declaration under Section 564(b)(1) of the Act, 21 U.S.C.section 360bbb-3(b)(1), unless the authorization is terminated  or revoked sooner.       Influenza A by PCR NEGATIVE NEGATIVE Final   Influenza B by PCR NEGATIVE NEGATIVE Final    Comment: (NOTE) The Xpert Xpress SARS-CoV-2/FLU/RSV plus assay is intended as an aid in the diagnosis of influenza from Nasopharyngeal swab specimens and should not be used as a sole basis for treatment. Nasal washings and aspirates are unacceptable for Xpert Xpress SARS-CoV-2/FLU/RSV testing.  Fact Sheet for Patients: EntrepreneurPulse.com.au  Fact Sheet for Healthcare  Providers: IncredibleEmployment.be  This test is not yet approved or cleared by the [place] FDA and has been authorized for detection and/or diagnosis of SARS-CoV-2 by FDA under an Emergency Use Authorization (EUA). This EUA will remain in effect (meaning this test can be used) for the duration of the COVID-19 declaration under Section 564(b)(1)  of the Act, 21 U.S.C. section 360bbb-3(b)(1), unless the authorization is terminated or revoked.  Performed at [facility], [address] [place],  [address]      Radiology Studies: DG Abd Portable 1V  Result Date: 01/27/2021 CLINICAL DATA:  Constipation EXAM: PORTABLE ABDOMEN - 1 VIEW COMPARISON:  None. FINDINGS: Moderate stool burden in the right colon. Mild gaseous distention of the right colon and transverse colon. No small bowel distention. No organomegaly or free air. No suspicious calcification. IMPRESSION: Moderate stool burden in the right colon. No evidence of bowel obstruction or free air. Electronically Signed   By: Rolm Baptise M.D.   On: 01/27/2021 15:30   DG Abd Portable 1V  Final Result    CT ABDOMEN PELVIS W CONTRAST  Final Result    DG Chest Port 1 View  Final Result      Scheduled Meds: . enoxaparin (LOVENOX) injection  40 mg Subcutaneous Q24H  . insulin aspart  0-15 Units Subcutaneous Q4H  . lactulose  30 g Oral BID  . lisinopril  20 mg Oral Daily  . metoprolol tartrate  50 mg Oral BID  . polyethylene glycol  17 g Oral Daily  . senna-docusate  1 tablet Oral BID   PRN Meds: acetaminophen **OR** acetaminophen, hydrALAZINE, HYDROmorphone (DILAUDID) injection, labetalol, ondansetron **OR** ondansetron (ZOFRAN) IV, oxyCODONE, promethazine (PHENERGAN) injection Continuous Infusions: . sodium chloride 50 mL/hr at 01/28/21 1231  . promethazine (PHENERGAN) injection Stopped (01/25/21 1318)     LOS: 2 days  Time spent: Greater than 50% of the 35 minute visit was spent in  counseling/coordination of care for the patient as laid out in the A&P.   Dwyane Dee, MD [facility] 01/28/2021, 12:52 PM

## 2021-01-29 ENCOUNTER — Inpatient Hospital Stay: Payer: HMO

## 2021-01-29 DIAGNOSIS — J9601 Acute respiratory failure with hypoxia: Secondary | ICD-10-CM

## 2021-01-29 LAB — CBC WITH DIFFERENTIAL/PLATELET
Abs Immature Granulocytes: 0.04 10*3/uL (ref 0.00–0.07)
Basophils Absolute: 0 10*3/uL (ref 0.0–0.1)
Basophils Relative: 1 %
Eosinophils Absolute: 0.2 10*3/uL (ref 0.0–0.5)
Eosinophils Relative: 3 %
HCT: 30 % — ABNORMAL LOW (ref 36.0–46.0)
Hemoglobin: 9.5 g/dL — ABNORMAL LOW (ref 12.0–15.0)
Immature Granulocytes: 1 %
Lymphocytes Relative: 15 %
Lymphs Abs: 1.2 10*3/uL (ref 0.7–4.0)
MCH: 26.2 pg (ref 26.0–34.0)
MCHC: 31.7 g/dL (ref 30.0–36.0)
MCV: 82.6 fL (ref 80.0–100.0)
Monocytes Absolute: 0.9 10*3/uL (ref 0.1–1.0)
Monocytes Relative: 11 %
Neutro Abs: 5.7 10*3/uL (ref 1.7–7.7)
Neutrophils Relative %: 69 %
Platelets: 210 10*3/uL (ref 150–400)
RBC: 3.63 MIL/uL — ABNORMAL LOW (ref 3.87–5.11)
RDW: 17 % — ABNORMAL HIGH (ref 11.5–15.5)
WBC: 8.1 10*3/uL (ref 4.0–10.5)
nRBC: 0 % (ref 0.0–0.2)

## 2021-01-29 LAB — COMPREHENSIVE METABOLIC PANEL
ALT: 9 U/L (ref 0–44)
AST: 11 U/L — ABNORMAL LOW (ref 15–41)
Albumin: 2.7 g/dL — ABNORMAL LOW (ref 3.5–5.0)
Alkaline Phosphatase: 37 U/L — ABNORMAL LOW (ref 38–126)
Anion gap: 6 (ref 5–15)
BUN: 11 mg/dL (ref 8–23)
CO2: 22 mmol/L (ref 22–32)
Calcium: 7.6 mg/dL — ABNORMAL LOW (ref 8.9–10.3)
Chloride: 110 mmol/L (ref 98–111)
Creatinine, Ser: 0.53 mg/dL (ref 0.44–1.00)
GFR, Estimated: >60 mL/min (ref >60)
Glucose, Bld: 160 mg/dL — ABNORMAL HIGH (ref 70–99)
Potassium: 3.4 mmol/L — ABNORMAL LOW (ref 3.5–5.1)
Sodium: 138 mmol/L (ref 135–145)
Total Bilirubin: 0.9 mg/dL (ref 0.3–1.2)
Total Protein: 5.5 g/dL — ABNORMAL LOW (ref 6.5–8.1)

## 2021-01-29 LAB — GLUCOSE, CAPILLARY
Glucose-Capillary: 136 mg/dL — ABNORMAL HIGH (ref 70–99)
Glucose-Capillary: 226 mg/dL — ABNORMAL HIGH (ref 70–99)
Glucose-Capillary: 150 mg/dL — ABNORMAL HIGH (ref 70–99)

## 2021-01-29 LAB — MAGNESIUM: Magnesium: 2 mg/dL (ref 1.7–2.4)

## 2021-01-29 MED ORDER — FUROSEMIDE 10 MG/ML IJ SOLN
40.0000 mg | Freq: Once | INTRAMUSCULAR | Status: AC
  Administered 2021-01-29: 40 mg via INTRAVENOUS
  Filled 2021-01-29: qty 4

## 2021-01-29 MED ORDER — PANTOPRAZOLE SODIUM 40 MG PO TBEC
40.0000 mg | DELAYED_RELEASE_TABLET | Freq: Two times a day (BID) | ORAL | Status: DC
  Administered 2021-01-29 – 2021-01-30 (×3): 40 mg via ORAL
  Filled 2021-01-29 (×3): qty 1

## 2021-01-29 MED ORDER — POTASSIUM CHLORIDE CRYS ER 20 MEQ PO TBCR
40.0000 meq | EXTENDED_RELEASE_TABLET | Freq: Once | ORAL | Status: AC
  Administered 2021-01-29: 40 meq via ORAL
  Filled 2021-01-29: qty 2

## 2021-01-29 NOTE — Progress Notes (Signed)
PROGRESS NOTE    Molly [name]   TDD:[id number]  DOB: 11-30-1940  DOA: 01/25/2021     3  PCP: Idelle Crouch, MD  CC: abdominal pain, N/V  Hospital Course: Molly [name] is a 80 yo female with PMH idiopathic pancreatitis, hypertension, hyperlipidemia, IBS, osteoporosis, GERD, fatty liver disease, DM II, arthritis who presented to Molly hospital with upper abdominal pain.  Her pain was described similar to prior episodes of pancreatitis flares.  She says she follows outpatient with gastroenterology and has had a work-up in Molly past regarding etiology and her episodes were considered idiopathic. She also had associated nausea/vomiting which is also typical for her from past flares.  Lipase was 1225 and there was elevated WBC and lactic acid.  She underwent CT abdomen/pelvis which showed acute pancreatitis, no pancreatic necrosis or ileus/obstruction. She was made NPO and started on IVF. Her lipase normalized and abdominal xray showed constipation. She was started on a bowel regimen and a CLD on 3/20 for trial.  She tolerated clear liquids fairly well and plan was to increase further to full liquids on 01/28/2021.  She also had a bowel movement on 01/27/2021 after being started on laxatives as abdominal x-ray showed right-sided stool burden. She tolerated further diet advancement and was advanced to soft diet on 01/29/2021.  Bowel movements also continued while on laxatives.    Interval History:  Still doing okay.  Tolerated full liquids yesterday.  Amenable to advancing to soft diet today.  Hopeful for discharge tomorrow if tolerates well.  Still having good bowel movements.  ROS: Constitutional: negative for chills and fevers, Respiratory: negative for cough, Cardiovascular: negative for chest pain and Gastrointestinal: positive for abdominal pain and nausea  Assessment & Plan: * Acute pancreatitis - lipase 1225 on admission; diagnosed with idiopathic recurrent pancreatitis -CT abdomen/pelvis  performed on admission.  No necrosis/ileus/obstruction -Continue pain and nausea medication PRN -Lipase significantly improved. Normalized on 3/20.  -Abdominal x-ray obtained on 01/27/2021: Shows underlying constipation with stool burden.  Continue laxative regimen -CLD tolerated; advance to FLD on 3/21; advanced to soft diet on 3/22 -If tolerates soft diet, could be discharged on Wednesday  Acute respiratory failure with hypoxia ([place]) -Remains on 1 to 2 L oxygen, not on chronic O2 at home.  Likely due to need for IV fluids on admission in setting of acute pancreatitis -Fluids discontinued on 01/29/2021 and dose of Lasix given -May need further repeat Lasix -Obtain CXR  Constipation - No ileus or obstruction noted on CT abdomen/pelvis on admission -Abdominal x-ray repeated on 01/27/2021.  Shows ongoing constipation -Bowel regimen started and she responded well on 01/27/2021 with having a bowel movement -Continue laxatives  Hypertension - continue lisinopril and lopressor  Hyperlipidemia - TG 70 - continue statin  Diabetes ([place]) - continue SSI and CBGs  Fatty liver disease, nonalcoholic - LFTs WNL - trend CMP  Elevated lactic acid level-resolved as of 01/27/2021 - low suspicion for infection; likely hypoperfusion from GI losses - no need to trend at this time    Old records reviewed in assessment of this [name]  Antimicrobials: n/a  DVT prophylaxis: enoxaparin (LOVENOX) injection 40 mg Start: 01/25/21 0515   Code Status:   Code Status: Full Code Family Communication: husband bedside  Disposition Plan: Status is: Inpatient  Remains inpatient appropriate because:IV treatments appropriate due to intensity of illness or inability to take PO and Inpatient level of care appropriate due to severity of illness   Dispo: Molly [name] is from: Home  Anticipated d/c is to: Home              [name] currently is not medically stable to d/c.   Difficult to place  [name] No  Risk of unplanned readmission score: Unplanned Admission- Pilot do not use: 12.48   Objective: Blood pressure (!) 159/69, pulse 67, temperature 97.6 F (36.4 C), temperature source Axillary, resp. rate 18, height 5\' 4"  (1.626 m), weight 77.1 kg, SpO2 98 %.  Examination: General appearance: alert, cooperative and no distress Head: Normocephalic, without obvious abnormality, atraumatic Eyes: EOMI Lungs: clear to auscultation bilaterally Heart: regular rate and rhythm and S1, S2 normal Abdomen: Nonspecific tenderness to palpation throughout, no rebound or guarding.  Bowel sounds are improved today Extremities: 1-2+ lower extremity edema Skin: mobility and turgor normal Neurologic: Grossly normal  Consultants:   n/a  Procedures:     Data Reviewed: I have personally reviewed following labs and imaging studies Results for orders placed or performed during Molly hospital encounter of 01/25/21 (from Molly past 24 hour(s))  Glucose, capillary     Status: Abnormal   Collection Time: 01/28/21  4:41 PM  Result Value Ref Range   Glucose-Capillary 134 (H) 70 - 99 mg/dL  CBC with Differential/Platelet     Status: Abnormal   Collection Time: 01/29/21  5:29 AM  Result Value Ref Range   WBC 8.1 4.0 - 10.5 K/uL   RBC 3.63 (L) 3.87 - 5.11 MIL/uL   Hemoglobin 9.5 (L) 12.0 - 15.0 g/dL   HCT 30.0 (L) 36.0 - 46.0 %   MCV 82.6 80.0 - 100.0 fL   MCH 26.2 26.0 - 34.0 pg   MCHC 31.7 30.0 - 36.0 g/dL   RDW 17.0 (H) 11.5 - 15.5 %   Platelets 210 150 - 400 K/uL   nRBC 0.0 0.0 - 0.2 %   Neutrophils Relative % 69 %   Neutro Abs 5.7 1.7 - 7.7 K/uL   Lymphocytes Relative 15 %   Lymphs Abs 1.2 0.7 - 4.0 K/uL   Monocytes Relative 11 %   Monocytes Absolute 0.9 0.1 - 1.0 K/uL   Eosinophils Relative 3 %   Eosinophils Absolute 0.2 0.0 - 0.5 K/uL   Basophils Relative 1 %   Basophils Absolute 0.0 0.0 - 0.1 K/uL   Immature Granulocytes 1 %   Abs Immature Granulocytes 0.04 0.00 - 0.07 K/uL   Comprehensive metabolic panel     Status: Abnormal   Collection Time: 01/29/21  5:29 AM  Result Value Ref Range   Sodium 138 135 - 145 mmol/L   Potassium 3.4 (L) 3.5 - 5.1 mmol/L   Chloride 110 98 - 111 mmol/L   CO2 22 22 - 32 mmol/L   Glucose, Bld 160 (H) 70 - 99 mg/dL   BUN 11 8 - 23 mg/dL   Creatinine, Ser 0.53 0.44 - 1.00 mg/dL   Calcium 7.6 (L) 8.9 - 10.3 mg/dL   Total Protein 5.5 (L) 6.5 - 8.1 g/dL   Albumin 2.7 (L) 3.5 - 5.0 g/dL   AST 11 (L) 15 - 41 U/L   ALT 9 0 - 44 U/L   Alkaline Phosphatase 37 (L) 38 - 126 U/L   Total Bilirubin 0.9 0.3 - 1.2 mg/dL   GFR, Estimated >60 >60 mL/min   Anion gap 6 5 - 15  Magnesium     Status: None   Collection Time: 01/29/21  5:29 AM  Result Value Ref Range   Magnesium 2.0 1.7 - 2.4 mg/dL  Glucose, capillary     Status: Abnormal   Collection Time: 01/29/21  8:13 AM  Result Value Ref Range   Glucose-Capillary 136 (H) 70 - 99 mg/dL   Comment 1 Notify RN   Glucose, capillary     Status: Abnormal   Collection Time: 01/29/21 11:07 AM  Result Value Ref Range   Glucose-Capillary 226 (H) 70 - 99 mg/dL   Comment 1 Notify RN     Recent Results (from Molly past 240 hour(s))  Urine culture     Status: Abnormal   Collection Time: 01/25/21 12:31 AM   Specimen: In/Out Cath Urine  Result Value Ref Range Status   Specimen Description   Final    IN/OUT CATH URINE Performed at [facility], [address] [place], [place] [address]    Special Requests   Final    NONE Performed at [facility], [address] [place], [place] [address]    Culture MULTIPLE SPECIES PRESENT, SUGGEST RECOLLECTION (A)  Final   Report Status 01/26/2021 FINAL  Final  Resp Panel by RT-PCR (Flu A&B, Covid) Nasopharyngeal Swab     Status: None   Collection Time: 01/25/21  7:38 AM   Specimen: Nasopharyngeal Swab; Nasopharyngeal(NP) swabs in vial transport medium  Result Value Ref Range Status   SARS Coronavirus 2 by RT PCR NEGATIVE NEGATIVE Final     Comment: (NOTE) SARS-CoV-2 target nucleic acids are NOT DETECTED.  Molly SARS-CoV-2 RNA is generally detectable in upper respiratory specimens during Molly acute phase of infection. Molly lowest concentration of SARS-CoV-2 viral copies this assay can detect is 138 copies/mL. A negative result does not preclude SARS-Cov-2 infection and should not be used as Molly sole basis for treatment or other [name] management decisions. A negative result may occur with  improper specimen collection/handling, submission of specimen other than nasopharyngeal swab, presence of viral mutation(s) within Molly areas targeted by this assay, and inadequate number of viral copies(<138 copies/mL). A negative result must be combined with clinical observations, [name] history, and epidemiological information. Molly expected result is Negative.  Fact Sheet for Patients:  EntrepreneurPulse.com.au  Fact Sheet for Healthcare Providers:  IncredibleEmployment.be  This test is no t yet approved or cleared by Molly [place] FDA and  has been authorized for detection and/or diagnosis of SARS-CoV-2 by FDA under an Emergency Use Authorization (EUA). This EUA will remain  in effect (meaning this test can be used) for Molly duration of Molly COVID-19 declaration under Section 564(b)(1) of Molly Act, 21 U.S.C.section 360bbb-3(b)(1), unless Molly authorization is terminated  or revoked sooner.       Influenza A by PCR NEGATIVE NEGATIVE Final   Influenza B by PCR NEGATIVE NEGATIVE Final    Comment: (NOTE) Molly Xpert Xpress SARS-CoV-2/FLU/RSV plus assay is intended as an aid in Molly diagnosis of influenza from Nasopharyngeal swab specimens and should not be used as a sole basis for treatment. Nasal washings and aspirates are unacceptable for Xpert Xpress SARS-CoV-2/FLU/RSV testing.  Fact Sheet for Patients: EntrepreneurPulse.com.au  Fact Sheet for Healthcare  Providers: IncredibleEmployment.be  This test is not yet approved or cleared by Molly [place] FDA and has been authorized for detection and/or diagnosis of SARS-CoV-2 by FDA under an Emergency Use Authorization (EUA). This EUA will remain in effect (meaning this test can be used) for Molly duration of Molly COVID-19 declaration under Section 564(b)(1) of Molly Act, 21 U.S.C. section 360bbb-3(b)(1), unless Molly authorization is terminated or revoked.  Performed at [facility], [phone]  [address], [place], [place] [address]      Radiology Studies: No results found. DG Abd Portable 1V  Final Result    CT ABDOMEN PELVIS W CONTRAST  Final Result    DG Chest Port 1 View  Final Result    DG Chest Port 1 View    (Results Pending)    Scheduled Meds: . enoxaparin (LOVENOX) injection  40 mg Subcutaneous Q24H  . insulin aspart  0-15 Units Subcutaneous TID WC  . lactulose  30 g Oral BID  . lisinopril  20 mg Oral Daily  . metoprolol tartrate  50 mg Oral BID  . pantoprazole  40 mg Oral BID  . polyethylene glycol  17 g Oral Daily  . senna-docusate  1 tablet Oral BID   PRN Meds: acetaminophen **OR** acetaminophen, hydrALAZINE, HYDROmorphone (DILAUDID) injection, labetalol, ondansetron **OR** ondansetron (ZOFRAN) IV, oxyCODONE, promethazine (PHENERGAN) injection Continuous Infusions: . promethazine (PHENERGAN) injection Stopped (01/25/21 1318)     LOS: 3 days  Time spent: Greater than 50% of Molly 35 minute visit was spent in counseling/coordination of care for Molly [name] as laid out in Molly A&P.   Dwyane Dee, MD [facility] 01/29/2021, 3:15 PM

## 2021-01-29 NOTE — Progress Notes (Signed)
Mobility Specialist - Progress Note   01/29/21 1600  Mobility  Activity Ambulated in room  Level of Assistance Minimal assist, patient does 75% or more  Assistive Device None  Distance Ambulated (ft) 40 ft  Mobility Response Tolerated well  Mobility performed by Mobility specialist  $Mobility charge 1 Mobility    Pre-mobility: 70 HR, 97% SpO2 During mobility: 87 HR, 98% SpO2  Pt ambulated 40' in room on RA. No AD. 1-hand assist. No LOB. O2 > 95% during ambulation, but did desat to a low of 90% once seated. O2 quickly rebounded to 96% once supine. Pt left on RA with  set on 1L and within reach. Confirmed with RN. Family at bedside.    Kathee Delton Mobility Specialist 01/29/21, 4:54 PM

## 2021-01-29 NOTE — Assessment & Plan Note (Signed)
-  Remains on 1 to 2 L oxygen, not on chronic O2 at home.  Likely due to need for IV fluids on admission in setting of acute pancreatitis -Fluids discontinued on 01/29/2021 and dose of Lasix given -May need further repeat Lasix -Obtain CXR

## 2021-01-29 NOTE — Progress Notes (Signed)
O2 sat dropped to 86% on room air while ambulating to bathroom. Upon return to bed, patient placed on 1L/M via [place].

## 2021-01-29 NOTE — Care Management Important Message (Signed)
Important Message  Patient Details  Name: Molly [name] MRN: [id number] Date of Birth: Nov 15, 1940   Medicare Important Message Given:  Yes     Dannette Barbara 01/29/2021, 10:57 AM

## 2021-01-29 NOTE — TOC Initial Note (Signed)
Transition of Care [facility]) - Initial/Assessment Note    Patient Details  Name: Molly [name] MRN: [id number] Date of Birth: 04/10/1941  Transition of Care [facility]) CM/SW Contact:    Beverly Sessions, RN Phone Number: 01/29/2021, 2:05 PM  Clinical Narrative:                 Patient admitted from home  Lives at home with husband PCP [facility]- Denies issues obtaining medications   At baseline patient is able to drives self.  Husband states that he is retired and will be able to provide transportation at discharge  PT has assessed patient and recommends home health PT.  Patient has Rw and cane in the home. Patient agreeable to home health services.  Husband states he does not have a preference of home health agency  Referral made and accepted by [place] with [facility]  Expected Discharge Plan: [place] Barriers to Discharge: Continued Medical Work up   Patient Goals and CMS Choice     Choice offered to / list presented to : Spouse  Expected Discharge Plan and Services Expected Discharge Plan: [place] Acute Care Choice: [place] arrangements for the past 2 months: Single Family Home                                      Prior Living Arrangements/Services Living arrangements for the past 2 months: Single Family Home Lives with:: Spouse Patient language and need for interpreter reviewed:: Yes Do you feel safe going back to the place where you live?: Yes      Need for Family Participation in Patient Care: Yes (Comment) Care giver support system in place?: Yes (comment) Current home services: DME Criminal Activity/Legal Involvement Pertinent to Current Situation/Hospitalization: No - Comment as needed  Activities of Daily Living Home Assistive Devices/Equipment: None ADL Screening (condition at time of admission) Patient's cognitive ability adequate to safely complete daily activities?: Yes Is the  patient deaf or have difficulty hearing?: No Does the patient have difficulty seeing, even when wearing glasses/contacts?: No Does the patient have difficulty concentrating, remembering, or making decisions?: No Patient able to express need for assistance with ADLs?: Yes Does the patient have difficulty dressing or bathing?: No Independently performs ADLs?: Yes (appropriate for developmental age) Does the patient have difficulty walking or climbing stairs?: No Weakness of Legs: None Weakness of Arms/Hands: None  Permission Sought/Granted                  Emotional Assessment       Orientation: : Oriented to Self,Oriented to Place,Oriented to  Time,Oriented to Situation   Psych Involvement: No (comment)  Admission diagnosis:  Acute pancreatitis [K85.90] Lactic acidosis [E87.2] Idiopathic acute pancreatitis without infection or necrosis [K85.00] Leukocytosis, unspecified type [D72.829] Patient Active Problem List   Diagnosis Date Noted  . Constipation 01/26/2021  . Pancreatitis, recurrent 01/25/2021  . Hypertension   . Fatty liver disease, nonalcoholic 69/67/8938  . Acute pancreatitis 03/18/2018  . HTN (hypertension) 06/06/2014  . Diabetes ([place]) 06/06/2014  . Hyperlipidemia 06/06/2014  . BACK PAIN, THORACIC REGION, LEFT 10/01/2010  . SINUSITIS - ACUTE-NOS 08/30/2009  . SLEEP DISORDER, CHRONIC 08/30/2009  . GOUT 04/03/2009  . GERD 09/08/2008  . PALPITATIONS 12/14/2007  . DIABETES MELLITUS, TYPE II 07/20/2007  . HYPERLIPIDEMIA 07/20/2007  . IBS 07/20/2007  PCP:  Idelle Crouch, MD Pharmacy:   [facility] [phone], [place] [address] Phone: [phone] Fax: [phone]  [id number] [address] [place] [address] [place] [address] Phone: [phone] Fax: [phone]     Social Determinants of Health (SDOH)  Interventions    Readmission Risk Interventions No flowsheet data found.

## 2021-01-30 LAB — CBC WITH DIFFERENTIAL/PLATELET
Abs Immature Granulocytes: 0.03 10*3/uL (ref 0.00–0.07)
Basophils Absolute: 0 10*3/uL (ref 0.0–0.1)
Basophils Relative: 0 %
Eosinophils Absolute: 0.2 10*3/uL (ref 0.0–0.5)
Eosinophils Relative: 3 %
HCT: 30 % — ABNORMAL LOW (ref 36.0–46.0)
Hemoglobin: 9.6 g/dL — ABNORMAL LOW (ref 12.0–15.0)
Immature Granulocytes: 0 %
Lymphocytes Relative: 22 %
Lymphs Abs: 1.4 10*3/uL (ref 0.7–4.0)
MCH: 25.9 pg — ABNORMAL LOW (ref 26.0–34.0)
MCHC: 32 g/dL (ref 30.0–36.0)
MCV: 81.1 fL (ref 80.0–100.0)
Monocytes Absolute: 0.8 10*3/uL (ref 0.1–1.0)
Monocytes Relative: 12 %
Neutro Abs: 4.2 10*3/uL (ref 1.7–7.7)
Neutrophils Relative %: 63 %
Platelets: 236 10*3/uL (ref 150–400)
RBC: 3.7 MIL/uL — ABNORMAL LOW (ref 3.87–5.11)
RDW: 17.2 % — ABNORMAL HIGH (ref 11.5–15.5)
WBC: 6.7 10*3/uL (ref 4.0–10.5)
nRBC: 0 % (ref 0.0–0.2)

## 2021-01-30 LAB — COMPREHENSIVE METABOLIC PANEL
ALT: 11 U/L (ref 0–44)
AST: 16 U/L (ref 15–41)
Albumin: 2.7 g/dL — ABNORMAL LOW (ref 3.5–5.0)
Alkaline Phosphatase: 40 U/L (ref 38–126)
Anion gap: 9 (ref 5–15)
BUN: 7 mg/dL — ABNORMAL LOW (ref 8–23)
CO2: 23 mmol/L (ref 22–32)
Calcium: 7.8 mg/dL — ABNORMAL LOW (ref 8.9–10.3)
Chloride: 104 mmol/L (ref 98–111)
Creatinine, Ser: 0.55 mg/dL (ref 0.44–1.00)
GFR, Estimated: >60 mL/min (ref >60)
Glucose, Bld: 182 mg/dL — ABNORMAL HIGH (ref 70–99)
Potassium: 3.2 mmol/L — ABNORMAL LOW (ref 3.5–5.1)
Sodium: 136 mmol/L (ref 135–145)
Total Bilirubin: 1 mg/dL (ref 0.3–1.2)
Total Protein: 5.6 g/dL — ABNORMAL LOW (ref 6.5–8.1)

## 2021-01-30 LAB — GLUCOSE, CAPILLARY
Glucose-Capillary: 187 mg/dL — ABNORMAL HIGH (ref 70–99)
Glucose-Capillary: 326 mg/dL — ABNORMAL HIGH (ref 70–99)

## 2021-01-30 LAB — MAGNESIUM: Magnesium: 2.1 mg/dL (ref 1.7–2.4)

## 2021-01-30 MED ORDER — POTASSIUM CHLORIDE CRYS ER 20 MEQ PO TBCR
40.0000 meq | EXTENDED_RELEASE_TABLET | Freq: Once | ORAL | Status: AC
  Administered 2021-01-30: 40 meq via ORAL
  Filled 2021-01-30: qty 2

## 2021-01-30 MED ORDER — FUROSEMIDE 10 MG/ML IJ SOLN
20.0000 mg | Freq: Once | INTRAMUSCULAR | Status: AC
  Administered 2021-01-30: 20 mg via INTRAVENOUS
  Filled 2021-01-30: qty 4

## 2021-01-30 NOTE — Discharge Summary (Addendum)
Discharge Summary  Molly [name] BMW:[id number] DOB: 1941-10-13  PCP: Molly Crouch, MD  Admit date: 01/25/2021 Discharge date: 01/30/2021  Time spent: 35 minutes.  Recommendations for Outpatient Follow-up:  1. Follow-up with your PCP in 1 to 2 weeks. 2. Follow up with your endocrinologist within a week due to change in your diabetes management.  Your 48U Lantus is being held to avoid hypoglycemia.  Defer to your endocrinologist to restart your Lantus.  Continue home glipizide and metformin. 3. Take your medications as prescribed. 4. Continue PT OT. 5. Continue fall precautions.  Discharge Diagnoses:  Active Hospital Problems   Diagnosis Date Noted  . Acute pancreatitis 03/18/2018  . Acute respiratory failure with hypoxia ([place]) 01/29/2021  . Constipation 01/26/2021  . Pancreatitis, recurrent 01/25/2021  . Hypertension   . Fatty liver disease, nonalcoholic 27/25/3664  . Hyperlipidemia 06/06/2014  . Diabetes ([place]) 06/06/2014    Resolved Hospital Problems   Diagnosis Date Noted Date Resolved  . Elevated lactic acid level 01/25/2021 01/27/2021    Discharge Condition: Stable.  Diet recommendation: Heart healthy carb modified diet, low-fat diet.  Vitals:   01/30/21 0805 01/30/21 1135  BP: (!) 170/78 (!) 154/62  Pulse: 92 65  Resp: 18 16  Temp: 97.7 F (36.5 C) 97.7 F (36.5 C)  SpO2: 96% 98%    History of present illness:  Molly [name] is a 80 yo female with PMH idiopathic pancreatitis, hypertension, hyperlipidemia, IBS, osteoporosis, GERD, fatty liver disease, DM II, arthritis who presented to the hospital with upper abdominal pain, associated with nausea vomiting.  Her pain was described similar to prior episodes of pancreatitis flares.  She says she follows outpatient with gastroenterology and has had a work-up in the past regarding etiology and her episodes were considered idiopathic.  Lipase was 1225 and there was elevated WBC and lactic acid.  She underwent CT  abdomen/pelvis which showed acute pancreatitis, no pancreatic necrosis or ileus/obstruction.  She was made NPO and started on IVF.  Her lipase normalized and abdominal xray showed constipation. She was started on a bowel regimen and a CLD on 3/20 for trial.  She tolerated clear liquids fairly well and plan was to increase further to full liquids on 01/28/2021.  She also had a bowel movement on 01/27/2021 after being started on laxatives, abdominal x-ray showed right-sided stool burden.  She tolerated further diet advancement and was advanced to soft diet on 01/29/2021.  Bowel movements also continued while on laxatives.   01/30/21: She was seen and examined at her bedside.  There were no acute events overnight.  She has no new complaints.  She is eager to go home.   Hospital Course:  Principal Problem:   Acute pancreatitis Active Problems:   Fatty liver disease, nonalcoholic   Diabetes ([place])   Hyperlipidemia   Pancreatitis, recurrent   Hypertension   Constipation   Acute respiratory failure with hypoxia (HCC)  Resolved acute pancreatitis - lipase 1225 on admission; diagnosed with idiopathic recurrent pancreatitis -CT abdomen/pelvis performed on admission.  No necrosis/ileus/obstruction -Continue pain and nausea medication PRN -Lipase significantly improved. Normalized on 3/20.  -Abdominal x-ray obtained on 01/27/2021: Shows underlying constipation with stool burden.  Continue laxative regimen -CLD tolerated; advance to FLD on 3/21; advanced to soft diet on 3/22 -Tolerating a diet well.  Resolved acute respiratory failure with hypoxia ([place]) -Not on chronic O2 at home.  Likely due to need for IV fluids on admission in setting of acute pancreatitis -Fluids discontinued on 01/29/2021 and  dose of Lasix given -1 dose of 20 mg IV Lasix repeated on 01/30/2021. -O2 saturation 98% on room air  Constipation - No ileus or obstruction noted on CT abdomen/pelvis on admission -Abdominal x-ray  repeated on 01/27/2021.  Shows ongoing constipation -Bowel regimen started and she responded well on 01/27/2021 with having a bowel movement -Continue laxatives  Hypertension - continue lisinopril and lopressor  Hyperlipidemia - TG 70 - continue home statin  Type 2 Diabetes (HCC) -Hemoglobin A1c 8.1 on 01/25/2021 -Resume home glipizide and Metformin. -Home Lantus 48 U on hold to avoid hypoglycemia -Follow up with your endocrinologist within a week  Fatty liver disease, nonalcoholic - LFTs WNL -Follow-up with GI outpatient  Elevated lactic acid level-resolved as of 01/27/2021 - low suspicion for infection; likely hypoperfusion from GI losses -Downtrended -Currently nonseptic appearing.  Hypokalemia K+ 3.2, repleted orally with Kcl 40 meq x 1 dose. She is on home potassium supplement, continue She is also on Acei, Lisinopril, which should improve her potassium level.    Follow up with your PCP in 1-2 weeks   Antimicrobials: n/a     Code Status:   Code Status: Full Code     Discharge Exam: BP (!) 154/62 (BP Location: Right Arm)   Pulse 65   Temp 97.7 F (36.5 C) (Oral)   Resp 16   Ht 5\' 4"  (1.626 m)   Wt 77.1 kg   SpO2 98%   BMI 29.18 kg/m  . General: 80 y.o. year-old female well developed well nourished in no acute distress.  Alert and oriented x3. . Cardiovascular: Regular rate and rhythm with no rubs or gallops.  No thyromegaly or JVD noted.   Marland Kitchen Respiratory: Clear to auscultation with no wheezes or rales. Good inspiratory effort. . Abdomen: Soft nontender nondistended with normal bowel sounds x4 quadrants. . Musculoskeletal: No lower extremity edema. 2/4 pulses in all 4 extremities. . Skin: No ulcerative lesions noted or rashes, . Psychiatry: Mood is appropriate for condition and setting  Discharge Instructions You were cared for by a hospitalist during your hospital stay. If you have any questions about your discharge medications or the care you  received while you were in the hospital after you are discharged, you can call the unit and asked to speak with the hospitalist on call if the hospitalist that took care of you is not available. Once you are discharged, your primary care physician will handle any further medical issues. Please note that NO REFILLS for any discharge medications will be authorized once you are discharged, as it is imperative that you return to your primary care physician (or establish a relationship with a primary care physician if you do not have one) for your aftercare needs so that they can reassess your need for medications and monitor your lab values.   Allergies as of 01/30/2021      Reactions   Alendronate Nausea Only   Codeine Nausea Only   Lansoprazole    REACTION: hurts stomach   Sulfonamide Derivatives    Childhood reaction (doesn't remember)   Adhesive [tape] Rash   Blisters if on too long      Medication List    STOP taking these medications   acetaminophen 325 MG tablet Commonly known as: TYLENOL   Klor-Con M20 20 MEQ tablet Generic drug: potassium chloride SA   Lantus SoloStar 100 UNIT/ML Solostar Pen Generic drug: insulin glargine   Multivital tablet   omeprazole 20 MG capsule Commonly known as: PRILOSEC  TAKE these medications   accu-chek softclix lancets 1 each by Other route as needed. Use as instructed   Biotin 2500 MCG Caps Take by mouth 2 (two) times daily.   CALCIUM PO Take 750 mg by mouth 2 (two) times daily.   glipiZIDE 10 MG 24 hr tablet Commonly known as: GLUCOTROL XL Take 10 mg by mouth 2 (two) times daily.   glucose blood test strip 1 each by Other route as directed. Use as instructed   HYDROmorphone 4 MG tablet Commonly known as: DILAUDID Take 4 mg by mouth as directed.   ibuprofen 200 MG tablet Commonly known as: ADVIL Take 400-600 mg by mouth every 6 (six) hours as needed.   lisinopril 20 MG tablet Commonly known as: ZESTRIL Take 20 mg by  mouth daily.   magnesium oxide 400 MG tablet Commonly known as: MAG-OX Take 1 tablet by mouth daily.   metFORMIN 500 MG 24 hr tablet Commonly known as: GLUCOPHAGE-XR Take 1,000 mg by mouth 2 (two) times daily.   metoprolol tartrate 50 MG tablet Commonly known as: LOPRESSOR Take 50 mg by mouth 2 (two) times daily.   pantoprazole 40 MG tablet Commonly known as: PROTONIX Take 40 mg by mouth 2 (two) times daily.   polyethylene glycol 17 g packet Commonly known as: MIRALAX / GLYCOLAX Take 17 g by mouth daily as needed.   potassium chloride 20 MEQ packet Commonly known as: KLOR-CON Take by mouth 2 (two) times daily.   promethazine 25 MG tablet Commonly known as: PHENERGAN Take 25-50 mg by mouth every 8 (eight) hours as needed.   simvastatin 20 MG tablet Commonly known as: ZOCOR Take 0.5 tablets (10 mg total) by mouth at bedtime. What changed: how much to take   VITAMIN B 12 PO Take 1 tablet by mouth daily.   VITAMIN D PO Take 500 Units by mouth 2 (two) times daily.      Allergies  Allergen Reactions  . Alendronate Nausea Only  . Codeine Nausea Only  . Lansoprazole     REACTION: hurts stomach  . Sulfonamide Derivatives     Childhood reaction (doesn't remember)  . Adhesive [Tape] Rash    Blisters if on too long    Follow-up Information    Molly Crouch, MD. Call in 1 day(s).   Specialty: Internal Medicine Why: Please call for a post hospital follow-up appointment. Contact information: [place] [address] [phone]        Judi Cong, MD. Call in 1 day(s).   Specialty: Endocrinology Why: Please call for a post hospital follow up appointment Contact information: [place] [facility] [place] [address] [phone]                The results of significant diagnostics from this hospitalization (including imaging, microbiology, ancillary and laboratory) are listed below  for reference.    Significant Diagnostic Studies: CT ABDOMEN PELVIS W CONTRAST  Result Date: 01/25/2021 CLINICAL DATA:  80 year old female with nausea, vomiting, abdominal pain. Prior history of pancreatitis. EXAM: CT ABDOMEN AND PELVIS WITH CONTRAST TECHNIQUE: Multidetector CT imaging of the abdomen and pelvis was performed using the standard protocol following bolus administration of intravenous contrast. CONTRAST:  115mL OMNIPAQUE IOHEXOL 300 MG/ML  SOLN COMPARISON:  CT Abdomen and Pelvis 08/09/2019 and earlier. FINDINGS: Lower chest: Chronic lung base scarring and/or atelectasis is stable since 2020. Calcified granuloma partially visible in the superior segment right lower lobe. Calcified  coronary artery atherosclerosis. No pericardial or pleural effusion. Hepatobiliary: Stable liver and gallbladder. Small volume perihepatic free fluid related to the pancreas. No biliary ductal enlargement. Pancreas: Confluent peripancreatic inflammation. Inflammation throughout the lesser sac and small volume of free fluid scattered in the upper abdomen. Partial pancreatic atrophy. But pancreatic enhancement appears preserved, no discrete pancreatic necrosis identified. No ductal dilatation. No discrete pancreatic lesion. Spleen: Negative aside from mild adjacent inflammation, small volume free fluid. Adrenals/Urinary Tract: Normal adrenal glands. Stable kidneys, with chronic small benign renal cysts, symmetric renal enhancement and contrast excretion. Negative urinary bladder. Incidental numerous pelvic phleboliths. Stomach/Bowel: Decompressed large bowel from the hepatic flexure distally. Normal appendix (coronal image 58). Fluid-filled but nondilated distal small bowel. Mostly decompressed jejunum. Inflammation in the lesser sac with mild secondary involvement of the stomach and moderate involvement of the duodenum and root of the small bowel mesentery. No free air. Vascular/Lymphatic: Extensive Aortoiliac calcified  atherosclerosis. Major arterial structures in the abdomen and pelvis are patent. Portal venous system including the splenic vein remains patent. No lymphadenopathy. Reproductive: Surgically absent uterus as before.  Negative ovaries. Other: No pelvic free fluid. Musculoskeletal: No acute osseous abnormality identified. IMPRESSION: 1. Acute Pancreatitis. Inflammation in the upper abdomen with secondary involvement of the duodenum. Small volume free fluid but no organized or drainable fluid collection at this time. No pancreatic necrosis or other adverse features. 2. Aortic Atherosclerosis (ICD10-I70.0). Electronically Signed   By: Genevie Ann M.D.   On: 01/25/2021 04:29   DG Chest Port 1 View  Result Date: 01/29/2021 CLINICAL DATA:  Shortness of breath EXAM: PORTABLE CHEST 1 VIEW COMPARISON:  01/25/2021 FINDINGS: Similar cardiomegaly with prominent mitral valve annular calcifications. Vascular congestion and chronic appearing diffuse interstitial opacities may represent chronic edema versus interstitial lung disease. No enlarging effusion or pneumothorax. Trachea midline. Aorta atherosclerotic. IMPRESSION: Cardiomegaly with vascular congestion and chronic interstitial changes versus chronic edema. Aortic Atherosclerosis (ICD10-I70.0). Electronically Signed   By: Jerilynn Mages.  Shick M.D.   On: 01/29/2021 16:34   DG Chest Port 1 View  Result Date: 01/25/2021 CLINICAL DATA:  Possible sepsis EXAM: PORTABLE CHEST 1 VIEW COMPARISON:  08/09/2019 FINDINGS: Mild cardiomegaly with vascular congestion and diffuse interstitial opacity likely mild pulmonary edema. Mitral annular calcification. No pleural effusion or pneumothorax. Aortic atherosclerosis. IMPRESSION: Cardiomegaly with vascular congestion and mild pulmonary edema. Electronically Signed   By: Donavan Foil M.D.   On: 01/25/2021 00:46   DG Abd Portable 1V  Result Date: 01/27/2021 CLINICAL DATA:  Constipation EXAM: PORTABLE ABDOMEN - 1 VIEW COMPARISON:  None. FINDINGS:  Moderate stool burden in the right colon. Mild gaseous distention of the right colon and transverse colon. No small bowel distention. No organomegaly or free air. No suspicious calcification. IMPRESSION: Moderate stool burden in the right colon. No evidence of bowel obstruction or free air. Electronically Signed   By: Rolm Baptise M.D.   On: 01/27/2021 15:30    Microbiology: Recent Results (from the past 240 hour(s))  Urine culture     Status: Abnormal   Collection Time: 01/25/21 12:31 AM   Specimen: In/Out Cath Urine  Result Value Ref Range Status   Specimen Description   Final    IN/OUT CATH URINE Performed at [facility], [address] [place], [place] [address]    Special Requests   Final    NONE Performed at [facility], [address] [place], [place] [address]    Culture MULTIPLE SPECIES PRESENT, SUGGEST RECOLLECTION (A)  Final  Report Status 01/26/2021 FINAL  Final  Resp Panel by RT-PCR (Flu A&B, Covid) Nasopharyngeal Swab     Status: None   Collection Time: 01/25/21  7:38 AM   Specimen: Nasopharyngeal Swab; Nasopharyngeal(NP) swabs in vial transport medium  Result Value Ref Range Status   SARS Coronavirus 2 by RT PCR NEGATIVE NEGATIVE Final    Comment: (NOTE) SARS-CoV-2 target nucleic acids are NOT DETECTED.  The SARS-CoV-2 RNA is generally detectable in upper respiratory specimens during the acute phase of infection. The lowest concentration of SARS-CoV-2 viral copies this assay can detect is 138 copies/mL. A negative result does not preclude SARS-Cov-2 infection and should not be used as the sole basis for treatment or other patient management decisions. A negative result may occur with  improper specimen collection/handling, submission of specimen other than nasopharyngeal swab, presence of viral mutation(s) within the areas targeted by this assay, and inadequate number of viral copies(<138 copies/mL). A negative result must be combined  with clinical observations, patient history, and epidemiological information. The expected result is Negative.  Fact Sheet for Patients:  EntrepreneurPulse.com.au  Fact Sheet for Healthcare Providers:  IncredibleEmployment.be  This test is no t yet approved or cleared by the [place] FDA and  has been authorized for detection and/or diagnosis of SARS-CoV-2 by FDA under an Emergency Use Authorization (EUA). This EUA will remain  in effect (meaning this test can be used) for the duration of the COVID-19 declaration under Section 564(b)(1) of the Act, 21 U.S.C.section 360bbb-3(b)(1), unless the authorization is terminated  or revoked sooner.       Influenza A by PCR NEGATIVE NEGATIVE Final   Influenza B by PCR NEGATIVE NEGATIVE Final    Comment: (NOTE) The Xpert Xpress SARS-CoV-2/FLU/RSV plus assay is intended as an aid in the diagnosis of influenza from Nasopharyngeal swab specimens and should not be used as a sole basis for treatment. Nasal washings and aspirates are unacceptable for Xpert Xpress SARS-CoV-2/FLU/RSV testing.  Fact Sheet for Patients: EntrepreneurPulse.com.au  Fact Sheet for Healthcare Providers: IncredibleEmployment.be  This test is not yet approved or cleared by the [place] FDA and has been authorized for detection and/or diagnosis of SARS-CoV-2 by FDA under an Emergency Use Authorization (EUA). This EUA will remain in effect (meaning this test can be used) for the duration of the COVID-19 declaration under Section 564(b)(1) of the Act, 21 U.S.C. section 360bbb-3(b)(1), unless the authorization is terminated or revoked.  Performed at [facility], [address] [place], [place] [address]      Labs: Basic Metabolic Panel: Recent Labs  Lab 01/26/21 0440 01/27/21 0516 01/28/21 0605 01/29/21 0529 01/30/21 0532  NA 138 138 138 138 136  K 4.1 3.2* 3.3* 3.4*  3.2*  CL 110 108 112* 110 104  CO2 21* 22 22 22 23   GLUCOSE 133* 139* 130* 160* 182*  BUN 19 18 13 11  7*  CREATININE 0.50 0.49 0.50 0.53 0.55  CALCIUM 8.4* 7.8* 7.4* 7.6* 7.8*  MG 1.7 1.7 2.1 2.0 2.1   Liver Function Tests: Recent Labs  Lab 01/26/21 0440 01/27/21 0516 01/28/21 0605 01/29/21 0529 01/30/21 0532  AST 19 13* 12* 11* 16  ALT 13 11 11 9 11   ALKPHOS 27* 26* 29* 37* 40  BILITOT 0.7 0.8 0.8 0.9 1.0  PROT 5.7* 5.4* 5.2* 5.5* 5.6*  ALBUMIN 3.3* 2.9* 2.7* 2.7* 2.7*   Recent Labs  Lab 01/25/21 0207 01/26/21 0440 01/27/21 0516  LIPASE 1,225* 109* 29   No results for  input(s): AMMONIA in the last 168 hours. CBC: Recent Labs  Lab 01/26/21 0440 01/27/21 0516 01/28/21 0605 01/29/21 0529 01/30/21 0532  WBC 15.6* 12.7* 9.7 8.1 6.7  NEUTROABS 12.4* 9.7* 7.0 5.7 4.2  HGB 11.2* 9.9* 9.0* 9.5* 9.6*  HCT 36.0 30.5* 29.1* 30.0* 30.0*  MCV 84.9 82.7 83.6 82.6 81.1  PLT 246 206 181 210 236   Cardiac Enzymes: No results for input(s): CKTOTAL, CKMB, CKMBINDEX, TROPONINI in the last 168 hours. BNP: BNP (last 3 results) No results for input(s): BNP in the last 8760 hours.  ProBNP (last 3 results) No results for input(s): PROBNP in the last 8760 hours.  CBG: Recent Labs  Lab 01/29/21 0813 01/29/21 1107 01/29/21 1643 01/30/21 0733 01/30/21 1034  GLUCAP 136* 226* 150* 187* 326*       Signed:  Kayleen Memos, MD [facility] 01/30/2021, 12:14 PM

## 2021-01-30 NOTE — Discharge Instructions (Signed)
Diabetes Mellitus and Nutrition, Adult When you have diabetes, or diabetes mellitus, it is very important to have healthy eating habits because your blood sugar (glucose) levels are greatly affected by what you eat and drink. Eating healthy foods in the right amounts, at about the same times every day, can help you:  Control your blood glucose.  Lower your risk of heart disease.  Improve your blood pressure.  Reach or maintain a healthy weight. What can affect my meal plan? Every person with diabetes is different, and each person has different needs for a meal plan. Your health care provider may recommend that you work with a dietitian to make a meal plan that is best for you. Your meal plan may vary depending on factors such as:  The calories you need.  The medicines you take.  Your weight.  Your blood glucose, blood pressure, and cholesterol levels.  Your activity level.  Other health conditions you have, such as heart or kidney disease. How do carbohydrates affect me? Carbohydrates, also called carbs, affect your blood glucose level more than any other type of food. Eating carbs naturally raises the amount of glucose in your blood. Carb counting is a method for keeping track of how many carbs you eat. Counting carbs is important to keep your blood glucose at a healthy level, especially if you use insulin or take certain oral diabetes medicines. It is important to know how many carbs you can safely have in each meal. This is different for every person. Your dietitian can help you calculate how many carbs you should have at each meal and for each snack. How does alcohol affect me? Alcohol can cause a sudden decrease in blood glucose (hypoglycemia), especially if you use insulin or take certain oral diabetes medicines. Hypoglycemia can be a life-threatening condition. Symptoms of hypoglycemia, such as sleepiness, dizziness, and confusion, are similar to symptoms of having too much  alcohol.  Do not drink alcohol if: ? Your health care provider tells you not to drink. ? You are pregnant, may be pregnant, or are planning to become pregnant.  If you drink alcohol: ? Do not drink on an empty stomach. ? Limit how much you use to:  0-1 drink a day for women.  0-2 drinks a day for men. ? Be aware of how much alcohol is in your drink. In the [place] one drink equals one 12 oz bottle of beer (355 mL), one 5 oz glass of wine (148 mL), or one 1 oz glass of hard liquor (44 mL). ? Keep yourself hydrated with water, diet soda, or unsweetened iced tea.  Keep in mind that regular soda, juice, and other mixers may contain a lot of sugar and must be counted as carbs. What are tips for following this plan? Reading food labels  Start by checking the serving size on the "Nutrition Facts" label of packaged foods and drinks. The amount of calories, carbs, fats, and other nutrients listed on the label is based on one serving of the item. Many items contain more than one serving per package.  Check the total grams (g) of carbs in one serving. You can calculate the number of servings of carbs in one serving by dividing the total carbs by 15. For example, if a food has 30 g of total carbs per serving, it would be equal to 2 servings of carbs.  Check the number of grams (g) of saturated fats and trans fats in one serving. Choose foods that have  a low amount or none of these fats.  Check the number of milligrams (mg) of salt (sodium) in one serving. Most people should limit total sodium intake to less than 2,300 mg per day.  Always check the nutrition information of foods labeled as "low-fat" or "nonfat." These foods may be higher in added sugar or refined carbs and should be avoided.  Talk to your dietitian to identify your daily goals for nutrients listed on the label. Shopping  Avoid buying canned, pre-made, or processed foods. These foods tend to be high in fat, sodium, and added  sugar.  Shop around the outside edge of the grocery store. This is where you will most often find fresh fruits and vegetables, bulk grains, fresh meats, and fresh dairy. Cooking  Use low-heat cooking methods, such as baking, instead of high-heat cooking methods like deep frying.  Cook using healthy oils, such as olive, canola, or sunflower oil.  Avoid cooking with butter, cream, or high-fat meats. Meal planning  Eat meals and snacks regularly, preferably at the same times every day. Avoid going long periods of time without eating.  Eat foods that are high in fiber, such as fresh fruits, vegetables, beans, and whole grains. Talk with your dietitian about how many servings of carbs you can eat at each meal.  Eat 4-6 oz (112-168 g) of lean protein each day, such as lean meat, chicken, fish, eggs, or tofu. One ounce (oz) of lean protein is equal to: ? 1 oz (28 g) of meat, chicken, or fish. ? 1 egg. ?  cup (62 g) of tofu.  Eat some foods each day that contain healthy fats, such as avocado, nuts, seeds, and fish.   What foods should I eat? Fruits Berries. Apples. Oranges. Peaches. Apricots. Plums. Grapes. Mango. Papaya. Pomegranate. Kiwi. Cherries. Vegetables Lettuce. Spinach. Leafy greens, including kale, chard, collard greens, and mustard greens. Beets. Cauliflower. Cabbage. Broccoli. Carrots. Green beans. Tomatoes. Peppers. Onions. Cucumbers. Brussels sprouts. Grains Whole grains, such as whole-wheat or whole-grain bread, crackers, tortillas, cereal, and pasta. Unsweetened oatmeal. Quinoa. Brown or wild rice. Meats and other proteins Seafood. Poultry without skin. Lean cuts of poultry and beef. Tofu. Nuts. Seeds. Dairy Low-fat or fat-free dairy products such as milk, yogurt, and cheese. The items listed above may not be a complete list of foods and beverages you can eat. Contact a dietitian for more information. What foods should I avoid? Fruits Fruits canned with  syrup. Vegetables Canned vegetables. Frozen vegetables with butter or cream sauce. Grains Refined white flour and flour products such as bread, pasta, snack foods, and cereals. Avoid all processed foods. Meats and other proteins Fatty cuts of meat. Poultry with skin. Breaded or fried meats. Processed meat. Avoid saturated fats. Dairy Full-fat yogurt, cheese, or milk. Beverages Sweetened drinks, such as soda or iced tea. The items listed above may not be a complete list of foods and beverages you should avoid. Contact a dietitian for more information. Questions to ask a health care provider  Do I need to meet with a diabetes educator?  Do I need to meet with a dietitian?  What number can I call if I have questions?  When are the best times to check my blood glucose? Where to find more information:  American Diabetes Association: diabetes.org  Academy of Nutrition and Dietetics: www.eatright.[employer] of Diabetes and Digestive and Kidney Diseases: [employer]  Association of Diabetes Care and Education Specialists: www.diabeteseducator.org Summary  It is important to have healthy eating  habits because your blood sugar (glucose) levels are greatly affected by what you eat and drink.  A healthy meal plan will help you control your blood glucose and maintain a healthy lifestyle.  Your health care provider may recommend that you work with a dietitian to make a meal plan that is best for you.  Keep in mind that carbohydrates (carbs) and alcohol have immediate effects on your blood glucose levels. It is important to count carbs and to use alcohol carefully. This information is not intended to replace advice given to you by your health care provider. Make sure you discuss any questions you have with your health care provider. Document Revised: 10/04/2019 Document Reviewed: 10/04/2019 Elsevier Patient Education  2021 [place]. Acute Pancreatitis  Acute  pancreatitis happens when the pancreas gets swollen. The pancreas is a large gland in the body that helps to control blood sugar. It also makes enzymes that help to digest food. This condition can last a few days and cause serious problems. The lungs, heart, and kidneys may stop working. What are the causes? Causes include:  Alcohol abuse.  Drug abuse.  Gallstones.  A tumor in the pancreas. Other causes include:  Some medicines.  Some chemicals.  Diabetes.  An infection.  Damage caused by an accident.  The poison (venom) from a scorpion bite.  Belly (abdominal) surgery.  The body's defense system (immune system) attacking the pancreas (autoimmune pancreatitis).  Genes that are passed from parent to child (inherited). In some cases, the cause is not known. What are the signs or symptoms?  Pain in the upper belly that may be felt in the back. The pain may be very bad.  Swelling of the belly.  Feeling sick to your stomach (nauseous) and throwing up (vomiting).  Fever. How is this treated? You will likely have to stay in the hospital. Treatment may include:  Pain medicine.  Fluid through an IV tube.  Placing a tube in the stomach to take out the stomach contents. This may help you stop throwing up.  Not eating for 3-4 days.  Antibiotic medicines, if you have an infection.  Treating any other problems that may be the cause.  Steroid medicines, if your problem is caused by your defense system attacking your body's own tissues.  Surgery. Follow these instructions at home: Eating and drinking  Follow instructions from your doctor about what to eat and drink.  Eat foods that do not have a lot of fat in them.  Eat small meals often. Do not eat big meals.  Drink enough fluid to keep your pee (urine) pale yellow.  Do not drink alcohol if it caused your condition.   Medicines  Take over-the-counter and prescription medicines only as told by your  doctor.  Ask your doctor if the medicine prescribed to you: ? Requires you to avoid driving or using heavy machinery. ? Can cause trouble pooping (constipation). You may need to take steps to prevent or treat trouble pooping:  Take over-the-counter or prescription medicines.  Eat foods that are high in fiber. These include beans, whole grains, and fresh fruits and vegetables.  Limit foods that are high in fat and sugar. These include fried or sweet foods. General instructions  Do not use any products that contain nicotine or tobacco, such as cigarettes, e-cigarettes, and chewing tobacco. If you need help quitting, ask your doctor.  Get plenty of rest.  Check your blood sugar at home as told by your doctor.  Keep all  follow-up visits as told by your doctor. This is important. Contact a doctor if:  You do not get better as quickly as expected.  You have new symptoms.  Your symptoms get worse.  You have pain or weakness that lasts a long time.  You keep feeling sick to your stomach.  You get better and then you have pain again.  You have a fever. Get help right away if:  You cannot eat or keep fluids down.  Your pain gets very bad.  Your skin or the white part of your eyes turns yellow.  You have sudden swelling in your belly.  You throw up.  You feel dizzy or you pass out (faint).  Your blood sugar is high (over 300 mg/dL). Summary  Acute pancreatitis happens when the pancreas gets swollen.  This condition is often caused by alcohol abuse, drug abuse, or gallstones.  You will likely have to stay in the hospital for treatment. This information is not intended to replace advice given to you by your health care provider. Make sure you discuss any questions you have with your health care provider. Document Revised: 08/16/2018 Document Reviewed: 08/16/2018 Elsevier Patient Education  2021 [place].   Constipation, Adult Constipation is when a person has  trouble pooping (having a bowel movement). When you have this condition, you may poop fewer than 3 times a week. Your poop (stool) may also be dry, hard, or bigger than normal. Follow these instructions at home: Eating and drinking  Eat foods that have a lot of fiber, such as: ? Fresh fruits and vegetables. ? Whole grains. ? Beans.  Eat less of foods that are low in fiber and high in fat and sugar, such as: ? [place] fries. ? Hamburgers. ? Cookies. ? Candy. ? Soda.  Drink enough fluid to keep your pee (urine) pale yellow.   General instructions  Exercise regularly or as told by your doctor. Try to do 150 minutes of exercise each week.  Go to the restroom when you feel like you need to poop. Do not hold it in.  Take over-the-counter and prescription medicines only as told by your doctor. These include any fiber supplements.  When you poop: ? Do deep breathing while relaxing your lower belly (abdomen). ? Relax your pelvic floor. The pelvic floor is a group of muscles that support the rectum, bladder, and intestines (as well as the uterus in women).  Watch your condition for any changes. Tell your doctor if you notice any.  Keep all follow-up visits as told by your doctor. This is important. Contact a doctor if:  You have pain that gets worse.  You have a fever.  You have not pooped for 4 days.  You vomit.  You are not hungry.  You lose weight.  You are bleeding from the opening of the butt (anus).  You have thin, pencil-like poop. Get help right away if:  You have a fever, and your symptoms suddenly get worse.  You leak poop or have blood in your poop.  Your belly feels hard or bigger than normal (bloated).  You have very bad belly pain.  You feel dizzy or you faint. Summary  Constipation is when a person poops fewer than 3 times a week, has trouble pooping, or has poop that is dry, hard, or bigger than normal.  Eat foods that have a lot of fiber.  Drink  enough fluid to keep your pee (urine) pale yellow.  Take over-the-counter and prescription  medicines only as told by your doctor. These include any fiber supplements. This information is not intended to replace advice given to you by your health care provider. Make sure you discuss any questions you have with your health care provider. Document Revised: 09/14/2019 Document Reviewed: 09/14/2019 Elsevier Patient Education  [place].

## 2021-01-30 NOTE — TOC Transition Note (Addendum)
Transition of Care [facility]) - CM/SW Discharge Note   Patient Details  Name: Molly [name] MRN: [id number] Date of Birth: 06-23-1941  Transition of Care [facility]) CM/SW Contact:  Beverly Sessions, RN Phone Number: 01/30/2021, 11:55 AM   Clinical Narrative:    Patient to discharge today Husband to transport [place] with [facility] notified of discharge .   Weaned to RA Final next level of care: [place] Barriers to Discharge: No Barriers Identified   Patient Goals and CMS Choice     Choice offered to / list presented to : Spouse  Discharge Placement                       Discharge Plan and Services     Post Acute Care Choice: [place]: PT,OT [facility] Agency: [facility] [place] Date [facility] Agency Contacted: 01/30/21   Representative spoke with at [place]: [place] ([place]) Interventions     Readmission Risk Interventions No flowsheet data found.

## 2021-02-01 DIAGNOSIS — D0439 Carcinoma in situ of skin of other parts of face: Secondary | ICD-10-CM | POA: Diagnosis not present

## 2021-02-04 DIAGNOSIS — R002 Palpitations: Secondary | ICD-10-CM | POA: Diagnosis not present

## 2021-02-04 DIAGNOSIS — I10 Essential (primary) hypertension: Secondary | ICD-10-CM | POA: Diagnosis not present

## 2021-02-05 DIAGNOSIS — Z79899 Other long term (current) drug therapy: Secondary | ICD-10-CM | POA: Diagnosis not present

## 2021-02-05 DIAGNOSIS — K219 Gastro-esophageal reflux disease without esophagitis: Secondary | ICD-10-CM | POA: Diagnosis not present

## 2021-02-05 DIAGNOSIS — Z7984 Long term (current) use of oral hypoglycemic drugs: Secondary | ICD-10-CM | POA: Diagnosis not present

## 2021-02-05 DIAGNOSIS — K760 Fatty (change of) liver, not elsewhere classified: Secondary | ICD-10-CM | POA: Diagnosis not present

## 2021-02-05 DIAGNOSIS — Z794 Long term (current) use of insulin: Secondary | ICD-10-CM | POA: Diagnosis not present

## 2021-02-05 DIAGNOSIS — Z90710 Acquired absence of both cervix and uterus: Secondary | ICD-10-CM | POA: Diagnosis not present

## 2021-02-05 DIAGNOSIS — E119 Type 2 diabetes mellitus without complications: Secondary | ICD-10-CM | POA: Diagnosis not present

## 2021-02-05 DIAGNOSIS — I4891 Unspecified atrial fibrillation: Secondary | ICD-10-CM | POA: Diagnosis not present

## 2021-02-05 DIAGNOSIS — M1990 Unspecified osteoarthritis, unspecified site: Secondary | ICD-10-CM | POA: Diagnosis not present

## 2021-02-05 DIAGNOSIS — M109 Gout, unspecified: Secondary | ICD-10-CM | POA: Diagnosis not present

## 2021-02-05 DIAGNOSIS — Z9841 Cataract extraction status, right eye: Secondary | ICD-10-CM | POA: Diagnosis not present

## 2021-02-05 DIAGNOSIS — Z86010 Personal history of colonic polyps: Secondary | ICD-10-CM | POA: Diagnosis not present

## 2021-02-05 DIAGNOSIS — G479 Sleep disorder, unspecified: Secondary | ICD-10-CM | POA: Diagnosis not present

## 2021-02-05 DIAGNOSIS — Z79891 Long term (current) use of opiate analgesic: Secondary | ICD-10-CM | POA: Diagnosis not present

## 2021-02-05 DIAGNOSIS — K649 Unspecified hemorrhoids: Secondary | ICD-10-CM | POA: Diagnosis not present

## 2021-02-05 DIAGNOSIS — Z9181 History of falling: Secondary | ICD-10-CM | POA: Diagnosis not present

## 2021-02-05 DIAGNOSIS — K859 Acute pancreatitis without necrosis or infection, unspecified: Secondary | ICD-10-CM | POA: Diagnosis not present

## 2021-02-05 DIAGNOSIS — K589 Irritable bowel syndrome without diarrhea: Secondary | ICD-10-CM | POA: Diagnosis not present

## 2021-02-05 DIAGNOSIS — Z87891 Personal history of nicotine dependence: Secondary | ICD-10-CM | POA: Diagnosis not present

## 2021-02-05 DIAGNOSIS — Z85828 Personal history of other malignant neoplasm of skin: Secondary | ICD-10-CM | POA: Diagnosis not present

## 2021-02-05 DIAGNOSIS — I10 Essential (primary) hypertension: Secondary | ICD-10-CM | POA: Diagnosis not present

## 2021-02-08 DIAGNOSIS — E119 Type 2 diabetes mellitus without complications: Secondary | ICD-10-CM | POA: Diagnosis not present

## 2021-02-08 DIAGNOSIS — M1990 Unspecified osteoarthritis, unspecified site: Secondary | ICD-10-CM | POA: Diagnosis not present

## 2021-02-08 DIAGNOSIS — I10 Essential (primary) hypertension: Secondary | ICD-10-CM | POA: Diagnosis not present

## 2021-02-08 DIAGNOSIS — K760 Fatty (change of) liver, not elsewhere classified: Secondary | ICD-10-CM | POA: Diagnosis not present

## 2021-02-08 DIAGNOSIS — I4891 Unspecified atrial fibrillation: Secondary | ICD-10-CM | POA: Diagnosis not present

## 2021-02-08 DIAGNOSIS — K219 Gastro-esophageal reflux disease without esophagitis: Secondary | ICD-10-CM | POA: Diagnosis not present

## 2021-02-08 DIAGNOSIS — K859 Acute pancreatitis without necrosis or infection, unspecified: Secondary | ICD-10-CM | POA: Diagnosis not present

## 2021-02-11 DIAGNOSIS — J9690 Respiratory failure, unspecified, unspecified whether with hypoxia or hypercapnia: Secondary | ICD-10-CM | POA: Diagnosis not present

## 2021-02-11 DIAGNOSIS — K760 Fatty (change of) liver, not elsewhere classified: Secondary | ICD-10-CM | POA: Diagnosis not present

## 2021-02-11 DIAGNOSIS — Z79899 Other long term (current) drug therapy: Secondary | ICD-10-CM | POA: Diagnosis not present

## 2021-02-11 DIAGNOSIS — K8500 Idiopathic acute pancreatitis without necrosis or infection: Secondary | ICD-10-CM | POA: Diagnosis not present

## 2021-02-11 DIAGNOSIS — E118 Type 2 diabetes mellitus with unspecified complications: Secondary | ICD-10-CM | POA: Diagnosis not present

## 2021-02-11 DIAGNOSIS — I10 Essential (primary) hypertension: Secondary | ICD-10-CM | POA: Diagnosis not present

## 2021-02-11 DIAGNOSIS — E782 Mixed hyperlipidemia: Secondary | ICD-10-CM | POA: Diagnosis not present

## 2021-02-11 DIAGNOSIS — J9601 Acute respiratory failure with hypoxia: Secondary | ICD-10-CM | POA: Diagnosis not present

## 2021-02-12 DIAGNOSIS — E782 Mixed hyperlipidemia: Secondary | ICD-10-CM | POA: Diagnosis not present

## 2021-02-12 DIAGNOSIS — I10 Essential (primary) hypertension: Secondary | ICD-10-CM | POA: Diagnosis not present

## 2021-02-12 DIAGNOSIS — K8500 Idiopathic acute pancreatitis without necrosis or infection: Secondary | ICD-10-CM | POA: Diagnosis not present

## 2021-02-12 DIAGNOSIS — K760 Fatty (change of) liver, not elsewhere classified: Secondary | ICD-10-CM | POA: Diagnosis not present

## 2021-02-12 DIAGNOSIS — Z79899 Other long term (current) drug therapy: Secondary | ICD-10-CM | POA: Diagnosis not present

## 2021-02-12 DIAGNOSIS — E118 Type 2 diabetes mellitus with unspecified complications: Secondary | ICD-10-CM | POA: Diagnosis not present

## 2021-02-12 DIAGNOSIS — J9601 Acute respiratory failure with hypoxia: Secondary | ICD-10-CM | POA: Diagnosis not present

## 2021-04-09 DIAGNOSIS — R809 Proteinuria, unspecified: Secondary | ICD-10-CM | POA: Diagnosis not present

## 2021-04-09 DIAGNOSIS — Z794 Long term (current) use of insulin: Secondary | ICD-10-CM | POA: Diagnosis not present

## 2021-04-09 DIAGNOSIS — E1129 Type 2 diabetes mellitus with other diabetic kidney complication: Secondary | ICD-10-CM | POA: Diagnosis not present

## 2021-04-10 DIAGNOSIS — E113293 Type 2 diabetes mellitus with mild nonproliferative diabetic retinopathy without macular edema, bilateral: Secondary | ICD-10-CM | POA: Diagnosis not present

## 2021-04-15 DIAGNOSIS — K760 Fatty (change of) liver, not elsewhere classified: Secondary | ICD-10-CM | POA: Diagnosis not present

## 2021-04-15 DIAGNOSIS — E118 Type 2 diabetes mellitus with unspecified complications: Secondary | ICD-10-CM | POA: Diagnosis not present

## 2021-04-15 DIAGNOSIS — I10 Essential (primary) hypertension: Secondary | ICD-10-CM | POA: Diagnosis not present

## 2021-04-15 DIAGNOSIS — E1129 Type 2 diabetes mellitus with other diabetic kidney complication: Secondary | ICD-10-CM | POA: Diagnosis not present

## 2021-04-15 DIAGNOSIS — M810 Age-related osteoporosis without current pathological fracture: Secondary | ICD-10-CM | POA: Diagnosis not present

## 2021-04-15 DIAGNOSIS — Z79899 Other long term (current) drug therapy: Secondary | ICD-10-CM | POA: Diagnosis not present

## 2021-04-15 DIAGNOSIS — E7800 Pure hypercholesterolemia, unspecified: Secondary | ICD-10-CM | POA: Diagnosis not present

## 2021-04-15 DIAGNOSIS — E113293 Type 2 diabetes mellitus with mild nonproliferative diabetic retinopathy without macular edema, bilateral: Secondary | ICD-10-CM | POA: Diagnosis not present

## 2021-04-15 DIAGNOSIS — Z794 Long term (current) use of insulin: Secondary | ICD-10-CM | POA: Diagnosis not present

## 2021-04-15 DIAGNOSIS — R809 Proteinuria, unspecified: Secondary | ICD-10-CM | POA: Diagnosis not present

## 2021-05-07 DIAGNOSIS — K861 Other chronic pancreatitis: Secondary | ICD-10-CM | POA: Insufficient documentation

## 2021-06-17 DIAGNOSIS — K859 Acute pancreatitis without necrosis or infection, unspecified: Secondary | ICD-10-CM | POA: Diagnosis not present

## 2021-06-28 DIAGNOSIS — E118 Type 2 diabetes mellitus with unspecified complications: Secondary | ICD-10-CM | POA: Diagnosis not present

## 2021-06-28 DIAGNOSIS — E7800 Pure hypercholesterolemia, unspecified: Secondary | ICD-10-CM | POA: Diagnosis not present

## 2021-06-28 DIAGNOSIS — I10 Essential (primary) hypertension: Secondary | ICD-10-CM | POA: Diagnosis not present

## 2021-06-28 DIAGNOSIS — Z79899 Other long term (current) drug therapy: Secondary | ICD-10-CM | POA: Diagnosis not present

## 2021-07-05 DIAGNOSIS — I10 Essential (primary) hypertension: Secondary | ICD-10-CM | POA: Diagnosis not present

## 2021-07-05 DIAGNOSIS — R7989 Other specified abnormal findings of blood chemistry: Secondary | ICD-10-CM | POA: Diagnosis not present

## 2021-07-05 DIAGNOSIS — K760 Fatty (change of) liver, not elsewhere classified: Secondary | ICD-10-CM | POA: Diagnosis not present

## 2021-07-05 DIAGNOSIS — E118 Type 2 diabetes mellitus with unspecified complications: Secondary | ICD-10-CM | POA: Diagnosis not present

## 2021-07-05 DIAGNOSIS — E7800 Pure hypercholesterolemia, unspecified: Secondary | ICD-10-CM | POA: Diagnosis not present

## 2021-08-20 DIAGNOSIS — M810 Age-related osteoporosis without current pathological fracture: Secondary | ICD-10-CM | POA: Diagnosis not present

## 2021-08-20 DIAGNOSIS — R809 Proteinuria, unspecified: Secondary | ICD-10-CM | POA: Diagnosis not present

## 2021-08-20 DIAGNOSIS — E1129 Type 2 diabetes mellitus with other diabetic kidney complication: Secondary | ICD-10-CM | POA: Diagnosis not present

## 2021-08-20 DIAGNOSIS — E113293 Type 2 diabetes mellitus with mild nonproliferative diabetic retinopathy without macular edema, bilateral: Secondary | ICD-10-CM | POA: Diagnosis not present

## 2021-08-20 DIAGNOSIS — Z794 Long term (current) use of insulin: Secondary | ICD-10-CM | POA: Diagnosis not present

## 2021-10-11 DIAGNOSIS — E782 Mixed hyperlipidemia: Secondary | ICD-10-CM | POA: Diagnosis not present

## 2021-10-11 DIAGNOSIS — I10 Essential (primary) hypertension: Secondary | ICD-10-CM | POA: Diagnosis not present

## 2021-10-11 DIAGNOSIS — Z79899 Other long term (current) drug therapy: Secondary | ICD-10-CM | POA: Diagnosis not present

## 2021-10-11 DIAGNOSIS — E118 Type 2 diabetes mellitus with unspecified complications: Secondary | ICD-10-CM | POA: Diagnosis not present

## 2021-12-24 DIAGNOSIS — E118 Type 2 diabetes mellitus with unspecified complications: Secondary | ICD-10-CM | POA: Diagnosis not present

## 2021-12-24 DIAGNOSIS — Z79899 Other long term (current) drug therapy: Secondary | ICD-10-CM | POA: Diagnosis not present

## 2021-12-24 DIAGNOSIS — E782 Mixed hyperlipidemia: Secondary | ICD-10-CM | POA: Diagnosis not present

## 2021-12-24 DIAGNOSIS — I10 Essential (primary) hypertension: Secondary | ICD-10-CM | POA: Diagnosis not present

## 2021-12-30 DIAGNOSIS — E1129 Type 2 diabetes mellitus with other diabetic kidney complication: Secondary | ICD-10-CM | POA: Diagnosis not present

## 2021-12-30 DIAGNOSIS — M810 Age-related osteoporosis without current pathological fracture: Secondary | ICD-10-CM | POA: Diagnosis not present

## 2021-12-30 DIAGNOSIS — E113293 Type 2 diabetes mellitus with mild nonproliferative diabetic retinopathy without macular edema, bilateral: Secondary | ICD-10-CM | POA: Diagnosis not present

## 2021-12-30 DIAGNOSIS — Z794 Long term (current) use of insulin: Secondary | ICD-10-CM | POA: Diagnosis not present

## 2021-12-30 DIAGNOSIS — R809 Proteinuria, unspecified: Secondary | ICD-10-CM | POA: Diagnosis not present

## 2022-01-10 DIAGNOSIS — E782 Mixed hyperlipidemia: Secondary | ICD-10-CM | POA: Diagnosis not present

## 2022-01-10 DIAGNOSIS — M810 Age-related osteoporosis without current pathological fracture: Secondary | ICD-10-CM | POA: Diagnosis not present

## 2022-01-10 DIAGNOSIS — Z0000 Encounter for general adult medical examination without abnormal findings: Secondary | ICD-10-CM | POA: Diagnosis not present

## 2022-01-10 DIAGNOSIS — K859 Acute pancreatitis without necrosis or infection, unspecified: Secondary | ICD-10-CM | POA: Diagnosis not present

## 2022-01-10 DIAGNOSIS — D649 Anemia, unspecified: Secondary | ICD-10-CM | POA: Diagnosis not present

## 2022-01-10 DIAGNOSIS — E118 Type 2 diabetes mellitus with unspecified complications: Secondary | ICD-10-CM | POA: Diagnosis not present

## 2022-01-10 DIAGNOSIS — Z1231 Encounter for screening mammogram for malignant neoplasm of breast: Secondary | ICD-10-CM | POA: Diagnosis not present

## 2022-01-10 DIAGNOSIS — I10 Essential (primary) hypertension: Secondary | ICD-10-CM | POA: Diagnosis not present

## 2022-01-14 ENCOUNTER — Other Ambulatory Visit: Payer: Self-pay | Admitting: Internal Medicine

## 2022-01-14 DIAGNOSIS — Z1231 Encounter for screening mammogram for malignant neoplasm of breast: Secondary | ICD-10-CM

## 2022-01-22 DIAGNOSIS — D649 Anemia, unspecified: Secondary | ICD-10-CM | POA: Diagnosis not present

## 2022-02-05 DIAGNOSIS — Z85828 Personal history of other malignant neoplasm of skin: Secondary | ICD-10-CM | POA: Diagnosis not present

## 2022-02-05 DIAGNOSIS — D225 Melanocytic nevi of trunk: Secondary | ICD-10-CM | POA: Diagnosis not present

## 2022-02-05 DIAGNOSIS — D2261 Melanocytic nevi of right upper limb, including shoulder: Secondary | ICD-10-CM | POA: Diagnosis not present

## 2022-02-05 DIAGNOSIS — D2271 Melanocytic nevi of right lower limb, including hip: Secondary | ICD-10-CM | POA: Diagnosis not present

## 2022-02-05 DIAGNOSIS — L821 Other seborrheic keratosis: Secondary | ICD-10-CM | POA: Diagnosis not present

## 2022-02-13 DIAGNOSIS — D649 Anemia, unspecified: Secondary | ICD-10-CM | POA: Diagnosis not present

## 2022-02-19 ENCOUNTER — Ambulatory Visit
Admission: RE | Admit: 2022-02-19 | Discharge: 2022-02-19 | Disposition: A | Discharge status: Home / Self Care | Payer: HMO | Source: Ambulatory Visit | Attending: Internal Medicine | Admitting: Internal Medicine

## 2022-02-19 DIAGNOSIS — Z1231 Encounter for screening mammogram for malignant neoplasm of breast: Secondary | ICD-10-CM | POA: Diagnosis not present

## 2022-05-21 DIAGNOSIS — I10 Essential (primary) hypertension: Secondary | ICD-10-CM | POA: Diagnosis not present

## 2022-05-21 DIAGNOSIS — E118 Type 2 diabetes mellitus with unspecified complications: Secondary | ICD-10-CM | POA: Diagnosis not present

## 2022-05-21 DIAGNOSIS — E7800 Pure hypercholesterolemia, unspecified: Secondary | ICD-10-CM | POA: Diagnosis not present

## 2022-05-21 DIAGNOSIS — K760 Fatty (change of) liver, not elsewhere classified: Secondary | ICD-10-CM | POA: Diagnosis not present

## 2022-05-21 DIAGNOSIS — M810 Age-related osteoporosis without current pathological fracture: Secondary | ICD-10-CM | POA: Diagnosis not present

## 2022-05-21 DIAGNOSIS — Z79899 Other long term (current) drug therapy: Secondary | ICD-10-CM | POA: Diagnosis not present

## 2022-06-23 DIAGNOSIS — Z794 Long term (current) use of insulin: Secondary | ICD-10-CM | POA: Diagnosis not present

## 2022-06-23 DIAGNOSIS — I10 Essential (primary) hypertension: Secondary | ICD-10-CM | POA: Diagnosis not present

## 2022-06-23 DIAGNOSIS — R809 Proteinuria, unspecified: Secondary | ICD-10-CM | POA: Diagnosis not present

## 2022-06-23 DIAGNOSIS — Z79899 Other long term (current) drug therapy: Secondary | ICD-10-CM | POA: Diagnosis not present

## 2022-06-23 DIAGNOSIS — E7800 Pure hypercholesterolemia, unspecified: Secondary | ICD-10-CM | POA: Diagnosis not present

## 2022-06-23 DIAGNOSIS — E1129 Type 2 diabetes mellitus with other diabetic kidney complication: Secondary | ICD-10-CM | POA: Diagnosis not present

## 2022-06-30 DIAGNOSIS — M810 Age-related osteoporosis without current pathological fracture: Secondary | ICD-10-CM | POA: Diagnosis not present

## 2022-06-30 DIAGNOSIS — Z794 Long term (current) use of insulin: Secondary | ICD-10-CM | POA: Diagnosis not present

## 2022-06-30 DIAGNOSIS — R809 Proteinuria, unspecified: Secondary | ICD-10-CM | POA: Diagnosis not present

## 2022-06-30 DIAGNOSIS — E113293 Type 2 diabetes mellitus with mild nonproliferative diabetic retinopathy without macular edema, bilateral: Secondary | ICD-10-CM | POA: Diagnosis not present

## 2022-06-30 DIAGNOSIS — E1129 Type 2 diabetes mellitus with other diabetic kidney complication: Secondary | ICD-10-CM | POA: Diagnosis not present

## 2022-10-16 ENCOUNTER — Other Ambulatory Visit: Payer: Self-pay | Admitting: Orthopedic Surgery

## 2022-10-16 DIAGNOSIS — S32040A Wedge compression fracture of fourth lumbar vertebra, initial encounter for closed fracture: Secondary | ICD-10-CM

## 2022-10-17 ENCOUNTER — Ambulatory Visit
Admission: RE | Admit: 2022-10-17 | Discharge: 2022-10-17 | Disposition: A | Discharge status: Home / Self Care | Payer: PPO | Source: Ambulatory Visit | Attending: Orthopedic Surgery | Admitting: Orthopedic Surgery

## 2022-10-17 DIAGNOSIS — S32040A Wedge compression fracture of fourth lumbar vertebra, initial encounter for closed fracture: Secondary | ICD-10-CM | POA: Diagnosis present

## 2022-11-19 DIAGNOSIS — Z882 Allergy status to sulfonamides status: Secondary | ICD-10-CM | POA: Diagnosis not present

## 2022-11-19 DIAGNOSIS — S32010A Wedge compression fracture of first lumbar vertebra, initial encounter for closed fracture: Secondary | ICD-10-CM | POA: Diagnosis not present

## 2022-11-19 DIAGNOSIS — M4856XA Collapsed vertebra, not elsewhere classified, lumbar region, initial encounter for fracture: Secondary | ICD-10-CM | POA: Diagnosis not present

## 2022-12-04 DIAGNOSIS — M25552 Pain in left hip: Secondary | ICD-10-CM | POA: Diagnosis not present

## 2022-12-04 DIAGNOSIS — M8000XA Age-related osteoporosis with current pathological fracture, unspecified site, initial encounter for fracture: Secondary | ICD-10-CM | POA: Diagnosis not present

## 2022-12-04 DIAGNOSIS — Z87891 Personal history of nicotine dependence: Secondary | ICD-10-CM | POA: Diagnosis not present

## 2022-12-04 DIAGNOSIS — S32010A Wedge compression fracture of first lumbar vertebra, initial encounter for closed fracture: Secondary | ICD-10-CM | POA: Diagnosis not present

## 2022-12-04 DIAGNOSIS — M4856XA Collapsed vertebra, not elsewhere classified, lumbar region, initial encounter for fracture: Secondary | ICD-10-CM | POA: Diagnosis not present

## 2022-12-17 DIAGNOSIS — M5441 Lumbago with sciatica, right side: Secondary | ICD-10-CM | POA: Diagnosis not present

## 2022-12-17 DIAGNOSIS — S32010A Wedge compression fracture of first lumbar vertebra, initial encounter for closed fracture: Secondary | ICD-10-CM | POA: Diagnosis not present

## 2022-12-17 DIAGNOSIS — M47816 Spondylosis without myelopathy or radiculopathy, lumbar region: Secondary | ICD-10-CM | POA: Diagnosis not present

## 2022-12-17 DIAGNOSIS — E118 Type 2 diabetes mellitus with unspecified complications: Secondary | ICD-10-CM | POA: Diagnosis not present

## 2022-12-31 DIAGNOSIS — M5136 Other intervertebral disc degeneration, lumbar region: Secondary | ICD-10-CM | POA: Diagnosis not present

## 2022-12-31 DIAGNOSIS — M47816 Spondylosis without myelopathy or radiculopathy, lumbar region: Secondary | ICD-10-CM | POA: Diagnosis not present

## 2023-01-15 DIAGNOSIS — R262 Difficulty in walking, not elsewhere classified: Secondary | ICD-10-CM | POA: Diagnosis not present

## 2023-01-15 DIAGNOSIS — R809 Proteinuria, unspecified: Secondary | ICD-10-CM | POA: Diagnosis not present

## 2023-01-15 DIAGNOSIS — Z794 Long term (current) use of insulin: Secondary | ICD-10-CM | POA: Diagnosis not present

## 2023-01-15 DIAGNOSIS — M810 Age-related osteoporosis without current pathological fracture: Secondary | ICD-10-CM | POA: Diagnosis not present

## 2023-01-15 DIAGNOSIS — M545 Low back pain, unspecified: Secondary | ICD-10-CM | POA: Diagnosis not present

## 2023-01-15 DIAGNOSIS — E1129 Type 2 diabetes mellitus with other diabetic kidney complication: Secondary | ICD-10-CM | POA: Diagnosis not present

## 2023-01-15 DIAGNOSIS — M62830 Muscle spasm of back: Secondary | ICD-10-CM | POA: Diagnosis not present

## 2023-01-15 DIAGNOSIS — M546 Pain in thoracic spine: Secondary | ICD-10-CM | POA: Diagnosis not present

## 2023-01-22 DIAGNOSIS — M810 Age-related osteoporosis without current pathological fracture: Secondary | ICD-10-CM | POA: Diagnosis not present

## 2023-01-22 DIAGNOSIS — Z1231 Encounter for screening mammogram for malignant neoplasm of breast: Secondary | ICD-10-CM | POA: Diagnosis not present

## 2023-01-22 DIAGNOSIS — Z79899 Other long term (current) drug therapy: Secondary | ICD-10-CM | POA: Diagnosis not present

## 2023-01-22 DIAGNOSIS — M545 Low back pain, unspecified: Secondary | ICD-10-CM | POA: Diagnosis not present

## 2023-01-22 DIAGNOSIS — I10 Essential (primary) hypertension: Secondary | ICD-10-CM | POA: Diagnosis not present

## 2023-01-22 DIAGNOSIS — E782 Mixed hyperlipidemia: Secondary | ICD-10-CM | POA: Diagnosis not present

## 2023-01-22 DIAGNOSIS — Z0000 Encounter for general adult medical examination without abnormal findings: Secondary | ICD-10-CM | POA: Diagnosis not present

## 2023-01-22 DIAGNOSIS — G8929 Other chronic pain: Secondary | ICD-10-CM | POA: Diagnosis not present

## 2023-01-22 DIAGNOSIS — Z1211 Encounter for screening for malignant neoplasm of colon: Secondary | ICD-10-CM | POA: Diagnosis not present

## 2023-01-22 DIAGNOSIS — E118 Type 2 diabetes mellitus with unspecified complications: Secondary | ICD-10-CM | POA: Diagnosis not present

## 2023-01-27 DIAGNOSIS — M546 Pain in thoracic spine: Secondary | ICD-10-CM | POA: Diagnosis not present

## 2023-01-27 DIAGNOSIS — R262 Difficulty in walking, not elsewhere classified: Secondary | ICD-10-CM | POA: Diagnosis not present

## 2023-01-27 DIAGNOSIS — M545 Low back pain, unspecified: Secondary | ICD-10-CM | POA: Diagnosis not present

## 2023-01-27 DIAGNOSIS — M62830 Muscle spasm of back: Secondary | ICD-10-CM | POA: Diagnosis not present

## 2023-01-29 DIAGNOSIS — R262 Difficulty in walking, not elsewhere classified: Secondary | ICD-10-CM | POA: Diagnosis not present

## 2023-01-29 DIAGNOSIS — M62830 Muscle spasm of back: Secondary | ICD-10-CM | POA: Diagnosis not present

## 2023-01-29 DIAGNOSIS — M546 Pain in thoracic spine: Secondary | ICD-10-CM | POA: Diagnosis not present

## 2023-01-29 DIAGNOSIS — M545 Low back pain, unspecified: Secondary | ICD-10-CM | POA: Diagnosis not present

## 2023-02-02 DIAGNOSIS — M545 Low back pain, unspecified: Secondary | ICD-10-CM | POA: Diagnosis not present

## 2023-02-02 DIAGNOSIS — M546 Pain in thoracic spine: Secondary | ICD-10-CM | POA: Diagnosis not present

## 2023-02-02 DIAGNOSIS — Z1211 Encounter for screening for malignant neoplasm of colon: Secondary | ICD-10-CM | POA: Diagnosis not present

## 2023-02-02 DIAGNOSIS — R262 Difficulty in walking, not elsewhere classified: Secondary | ICD-10-CM | POA: Diagnosis not present

## 2023-02-02 DIAGNOSIS — M62830 Muscle spasm of back: Secondary | ICD-10-CM | POA: Diagnosis not present

## 2023-02-04 ENCOUNTER — Encounter: Payer: Self-pay | Admitting: Dermatology

## 2023-02-04 ENCOUNTER — Ambulatory Visit: Payer: PPO | Admitting: Dermatology

## 2023-02-04 DIAGNOSIS — M546 Pain in thoracic spine: Secondary | ICD-10-CM | POA: Diagnosis not present

## 2023-02-04 DIAGNOSIS — L578 Other skin changes due to chronic exposure to nonionizing radiation: Secondary | ICD-10-CM | POA: Diagnosis not present

## 2023-02-04 DIAGNOSIS — Z1283 Encounter for screening for malignant neoplasm of skin: Secondary | ICD-10-CM

## 2023-02-04 DIAGNOSIS — L820 Inflamed seborrheic keratosis: Secondary | ICD-10-CM | POA: Diagnosis not present

## 2023-02-04 DIAGNOSIS — M62830 Muscle spasm of back: Secondary | ICD-10-CM | POA: Diagnosis not present

## 2023-02-04 DIAGNOSIS — R238 Other skin changes: Secondary | ICD-10-CM

## 2023-02-04 DIAGNOSIS — L304 Erythema intertrigo: Secondary | ICD-10-CM | POA: Diagnosis not present

## 2023-02-04 DIAGNOSIS — L570 Actinic keratosis: Secondary | ICD-10-CM

## 2023-02-04 DIAGNOSIS — L814 Other melanin hyperpigmentation: Secondary | ICD-10-CM

## 2023-02-04 DIAGNOSIS — M545 Low back pain, unspecified: Secondary | ICD-10-CM | POA: Diagnosis not present

## 2023-02-04 DIAGNOSIS — Z85828 Personal history of other malignant neoplasm of skin: Secondary | ICD-10-CM | POA: Diagnosis not present

## 2023-02-04 DIAGNOSIS — R262 Difficulty in walking, not elsewhere classified: Secondary | ICD-10-CM | POA: Diagnosis not present

## 2023-02-04 DIAGNOSIS — D229 Melanocytic nevi, unspecified: Secondary | ICD-10-CM

## 2023-02-04 DIAGNOSIS — L821 Other seborrheic keratosis: Secondary | ICD-10-CM | POA: Diagnosis not present

## 2023-02-04 MED ORDER — KETOCONAZOLE 2 % EX CREA: TOPICAL_CREAM | CUTANEOUS | 5 refills | Status: AC

## 2023-02-04 MED ORDER — TACROLIMUS 0.1 % EX OINT: TOPICAL_OINTMENT | CUTANEOUS | 2 refills | Status: DC

## 2023-02-04 NOTE — Patient Instructions (Addendum)
Start ketoconazole 2% cream - apply to rash on buttocks twice daily until improved. (Apply 1st) Start tacrolimus ointment - apply to rash on buttocks twice daily until improved. (Apply 2nd)  Start ketoconazole 2% cream - apply to rash under breasts twice a day when flared, then decrease to once a day. Recommend using daily during the summer as a preventative.  Recommend OTC Zeasorb AF powder to body folds daily after shower.  It is often found in the athlete's foot section in the pharmacy.  Avoid using powders that contain cornstarch.  Cryotherapy Aftercare  Wash gently with soap and water everyday.   Apply Vaseline and Band-Aid daily until healed.    Due to recent changes in healthcare laws, you may see results of your pathology and/or laboratory studies on MyChart before the doctors have had a chance to review them. We understand that in some cases there may be results that are confusing or concerning to you. Please understand that not all results are received at the same time and often the doctors may need to interpret multiple results in order to provide you with the best plan of care or course of treatment. Therefore, we ask that you please give [place] 2 business days to thoroughly review all your results before contacting the office for clarification. Should we see a critical lab result, you will be contacted sooner.   If You Need Anything After Your Visit  If you have any questions or concerns for your doctor, please call our main line at [phone] and press option 4 to reach your doctor's medical assistant. If no one answers, please leave a voicemail as directed and we will return your call as soon as possible. Messages left after 4 pm will be answered the following business day.   You may also send [place] a message via [place]. We typically respond to MyChart messages within 1-2 business days.  For prescription refills, please ask your pharmacy to contact our office. Our fax number is  [phone].  If you have an urgent issue when the clinic is closed that cannot wait until the next business day, you can page your doctor at the number below.    Please note that while we do our best to be available for urgent issues outside of office hours, we are not available 24/7.   If you have an urgent issue and are unable to reach [place], you may choose to seek medical care at your doctor's office, retail clinic, urgent care center, or emergency room.  If you have a medical emergency, please immediately call 911 or go to the emergency department.  Pager Numbers  - Dr. Nehemiah Massed: [phone]  - Dr. Laurence Ferrari: [phone]  - Dr. Nicole Kindred: [phone]  In the event of inclement weather, please call our main line at [phone] for an update on the status of any delays or closures.  Dermatology Medication Tips: Please keep the boxes that topical medications come in in order to help keep track of the instructions about where and how to use these. Pharmacies typically print the medication instructions only on the boxes and not directly on the medication tubes.   If your medication is too expensive, please contact our office at [phone] option 4 or send [place] a message through [place].   We are unable to tell what your co-pay for medications will be in advance as this is different depending on your insurance coverage. However, we may be able to find a substitute medication at lower cost or fill  out paperwork to get insurance to cover a needed medication.   If a prior authorization is required to get your medication covered by your insurance company, please allow [place] 1-2 business days to complete this process.  Drug prices often vary depending on where the prescription is filled and some pharmacies may offer cheaper prices.  The website www.goodrx.com contains coupons for medications through different pharmacies. The prices here do not account for what the cost may be with help from  insurance (it may be cheaper with your insurance), but the website can give you the price if you did not use any insurance.  - You can print the associated coupon and take it with your prescription to the pharmacy.  - You may also stop by our office during regular business hours and pick up a GoodRx coupon card.  - If you need your prescription sent electronically to a different pharmacy, notify our office through [facility] or by phone at [phone] option 4.     Si Usted Necesita Algo Despus de Su Visita  Tambin puede enviarnos un mensaje a travs de Pharmacist, community. Por lo general respondemos a los mensajes de MyChart en el transcurso de 1 a 2 das hbiles.  Para renovar recetas, por favor pida a su farmacia que se ponga en contacto con nuestra oficina. Harland Dingwall de fax es [place] [phone].  Si tiene un asunto urgente cuando la clnica est cerrada y que no puede esperar hasta el siguiente da hbil, puede llamar/localizar a su doctor(a) al nmero que aparece a continuacin.   Por favor, tenga en cuenta que aunque hacemos todo lo posible para estar disponibles para asuntos urgentes fuera del horario de [place], no estamos disponibles las 24 horas del da, los 7 das de la [place].   Si tiene un problema urgente y no puede comunicarse con nosotros, puede optar por buscar atencin mdica  en el consultorio de su doctor(a), en una clnica privada, en un centro de atencin urgente o en una sala de emergencias.  Si tiene Engineering geologist, por favor llame inmediatamente al 911 o vaya a la sala de emergencias.  Nmeros de bper  - Dr. Nehemiah Massed: [phone]  - Dra. Moye: [phone]  - Dra. Nicole Kindred: [phone]  En caso de inclemencias del [place], por favor llame a Johnsie Kindred principal al [phone] para una actualizacin sobre el [place] de cualquier retraso o cierre.  Consejos para la medicacin en dermatologa: Por favor, guarde las cajas en las que vienen los  medicamentos de uso tpico para ayudarle a seguir las instrucciones sobre dnde y cmo usarlos. Las farmacias generalmente imprimen las instrucciones del medicamento slo en las cajas y no directamente en los tubos del [place].   Si su medicamento es muy caro, por favor, pngase en contacto con Zigmund Daniel llamando al [phone] y presione la opcin 4 o envenos un mensaje a travs de Pharmacist, community.   No podemos decirle cul ser su copago por los medicamentos por adelantado ya que esto es diferente dependiendo de la cobertura de su seguro. Sin embargo, es posible que podamos encontrar un medicamento sustituto a Electrical engineer un formulario para que el seguro cubra el medicamento que se considera necesario.   Si se requiere una autorizacin previa para que su compaa de seguros [place] su medicamento, por favor permtanos de 1 a 2 das hbiles para completar este proceso.  Los precios de los medicamentos varan con frecuencia dependiendo del Environmental consultant de dnde se surte la receta y  alguna farmacias pueden ofrecer precios ms baratos.  El sitio web www.goodrx.com tiene cupones para medicamentos de Airline pilot. Los precios aqu no tienen en cuenta lo que podra costar con la ayuda del seguro (puede ser ms barato con su seguro), pero el sitio web puede darle el precio si no utiliz Research scientist (physical sciences).  - Puede imprimir el cupn correspondiente y llevarlo con su receta a la farmacia.  - Tambin puede pasar por nuestra oficina durante el horario de atencin regular y Charity fundraiser una tarjeta de cupones de GoodRx.  - Si necesita que su receta se enve electrnicamente a una farmacia diferente, informe a nuestra oficina a travs de MyChart de [place] o por telfono llamando al [phone] y presione la opcin 4.

## 2023-02-04 NOTE — Progress Notes (Signed)
New Patient Visit   Subjective  Molly [name] is a 82 y.o. female who presents for the following: Skin Cancer Screening and Full Body Skin Exam  The patient presents for Total-Body Skin Exam (TBSE) for skin cancer screening and mole check. The patient has spots, moles and lesions to be evaluated, some may be new or changing and the patient has concerns that these could be cancer. Spots on chest and temple are itchy.  She has a sore tender rash on buttocks she would like checked.  She has chronic back pain and has been sitting more lately.  She also has a rash under the breasts.    The following portions of the chart were reviewed this encounter and updated as appropriate: medications, allergies, medical history  Review of Systems:  No other skin or systemic complaints except as noted in HPI or Assessment and Plan.  Objective  Well appearing patient in no apparent distress; mood and affect are within normal limits.  A full examination was performed including scalp, head, eyes, ears, nose, lips, neck, chest, axillae, abdomen, back, buttocks, bilateral upper extremities, bilateral lower extremities, hands, feet, fingers, toes, fingernails, and toenails. All findings within normal limits unless otherwise noted below.   Relevant physical exam findings are noted in the Assessment and Plan.    Assessment & Plan   LENTIGINES, SEBORRHEIC KERATOSES, HEMANGIOMAS - Benign normal skin lesions - Benign-appearing - Call for any changes  MELANOCYTIC NEVI - Tan-brown and/or pink-flesh-colored symmetric macules and papules - Benign appearing on exam today - Observation - Call clinic for new or changing moles - Recommend daily use of broad spectrum spf 30+ sunscreen to sun-exposed areas.   ACTINIC DAMAGE - Chronic condition, secondary to cumulative UV/sun exposure - diffuse scaly erythematous macules with underlying dyspigmentation - Recommend daily broad spectrum sunscreen SPF 30+ to  sun-exposed areas, reapply every 2 hours as needed.  - Staying in the shade or wearing long sleeves, sun glasses (UVA+UVB protection) and wide brim hats (4-inch brim around the entire circumference of the hat) are also recommended for sun protection.  - Call for new or changing lesions.  SKIN CANCER SCREENING PERFORMED TODAY.  HISTORY OF BASAL CELL CARCINOMA OF THE SKIN - No evidence of recurrence today - Recommend regular full body skin exams - Recommend daily broad spectrum sunscreen SPF 30+ to sun-exposed areas, reapply every 2 hours as needed.  - Call if any new or changing lesions are noted between office visits  INTERTRIGO (vs Stage 1 Decubitus Injury gluteal cleft, medial buttocks) Exam Erythema with satellite papules inframammary; diffuse bright erythema with xerosis of the gluteal cleft, medial buttocks  Chronic and persistent condition with duration or expected duration over one year. Condition is bothersome/symptomatic for patient. Currently flared.  Intertrigo is a chronic recurrent rash that occurs in skin fold areas that may be associated with friction; heat; moisture; yeast; fungus; and bacteria.  It is exacerbated by increased movement / activity; sweating; and higher atmospheric temperature.  Treatment Plan Start ketoconazole 2% cream - Apply under breasts QD, BID for flares dsp 60g 5Rf. Also use on rash on medial buttocks BID (apply 1st). Recommend OTC Zeasorb AF powder to body folds daily after shower.  It is often found in the athlete's foot section in the pharmacy.  Avoid using powders that contain cornstarch. Start tacrolimus ointment - apply to rash on medial buttocks twice daily until improved (apply 2nd).  Continue using donut pillow while sitting to avoid pressure to gluteal  cleft/medial buttocks.  INFLAMED SEBORRHEIC KERATOSIS Exam: Erythematous keratotic or waxy stuck-on papule or plaque.  Symptomatic, irritating, patient would like  treated.  Benign-appearing.  Call clinic for new or changing lesions.   Prior to procedure, discussed risks of blister formation, small wound, skin dyspigmentation, or rare scar following treatment. Recommend Vaseline ointment to treated areas while healing.  Destruction Procedure Note Destruction method: cryotherapy   Informed consent: discussed and consent obtained   Lesion destroyed using liquid nitrogen: Yes   Outcome: patient tolerated procedure well with no complications   Post-procedure details: wound care instructions given   Locations: left chest x 2, left temple hairline x 1 # of Lesions Treated: 3  ACTINIC KERATOSIS Exam: Pink scaly macules  Actinic keratoses are precancerous spots that appear secondary to cumulative UV radiation exposure/sun exposure over time. They are chronic with expected duration over 1 year. A portion of actinic keratoses will progress to squamous cell carcinoma of the skin. It is not possible to reliably predict which spots will progress to skin cancer and so treatment is recommended to prevent development of skin cancer.  Recommend daily broad spectrum sunscreen SPF 30+ to sun-exposed areas, reapply every 2 hours as needed.  Recommend staying in the shade or wearing long sleeves, sun glasses (UVA+UVB protection) and wide brim hats (4-inch brim around the entire circumference of the hat). Call for new or changing lesions.  Treatment Plan:  Prior to procedure, discussed risks of blister formation, small wound, skin dyspigmentation, or rare scar following cryotherapy. Recommend Vaseline ointment to treated areas while healing.  Destruction Procedure Note Destruction method: cryotherapy   Informed consent: discussed and consent obtained   Lesion destroyed using liquid nitrogen: Yes   Outcome: patient tolerated procedure well with no complications   Post-procedure details: wound care instructions given   Locations: left cheek x 3, left nasal dorsum x  1, right lateral cheek x 2 # of Lesions Treated: 6  [place] Exam: Soft blue papule right lower lip  Benign-appearing.  Observation.  Call clinic for new or changing lesions.  Recommend daily use of broad spectrum spf 30+ sunscreen to sun-exposed areas.    Return if symptoms worsen or fail to improve.  IJamesetta Orleans, CMA, am acting as scribe for Brendolyn Patty, MD .   Documentation: I have reviewed the above documentation for accuracy and completeness, and I agree with the above.  Brendolyn Patty, MD

## 2023-02-08 ENCOUNTER — Other Ambulatory Visit: Payer: Self-pay | Admitting: Dermatology

## 2023-02-10 ENCOUNTER — Ambulatory Visit (HOSPITAL_BASED_OUTPATIENT_CLINIC_OR_DEPARTMENT_OTHER): Payer: PPO | Admitting: Pain Medicine

## 2023-02-10 ENCOUNTER — Encounter: Payer: Self-pay | Admitting: Pain Medicine

## 2023-02-10 ENCOUNTER — Other Ambulatory Visit: Payer: Self-pay | Admitting: Pain Medicine

## 2023-02-10 ENCOUNTER — Ambulatory Visit
Admission: RE | Admit: 2023-02-10 | Discharge: 2023-02-10 | Disposition: A | Discharge status: Home / Self Care | Payer: PPO | Source: Ambulatory Visit | Attending: Pain Medicine | Admitting: Pain Medicine

## 2023-02-10 VITALS — BP 153/55 | HR 60 | Temp 97.3°F | Resp 18 | Ht 64.0 in | Wt 170.0 lb

## 2023-02-10 DIAGNOSIS — M899 Disorder of bone, unspecified: Secondary | ICD-10-CM

## 2023-02-10 DIAGNOSIS — S32020S Wedge compression fracture of second lumbar vertebra, sequela: Secondary | ICD-10-CM

## 2023-02-10 DIAGNOSIS — R937 Abnormal findings on diagnostic imaging of other parts of musculoskeletal system: Secondary | ICD-10-CM

## 2023-02-10 DIAGNOSIS — G8929 Other chronic pain: Secondary | ICD-10-CM

## 2023-02-10 DIAGNOSIS — S32010S Wedge compression fracture of first lumbar vertebra, sequela: Secondary | ICD-10-CM

## 2023-02-10 DIAGNOSIS — G894 Chronic pain syndrome: Secondary | ICD-10-CM

## 2023-02-10 DIAGNOSIS — M545 Low back pain, unspecified: Secondary | ICD-10-CM | POA: Diagnosis not present

## 2023-02-10 DIAGNOSIS — M4310 Spondylolisthesis, site unspecified: Secondary | ICD-10-CM | POA: Insufficient documentation

## 2023-02-10 DIAGNOSIS — Z9889 Other specified postprocedural states: Secondary | ICD-10-CM

## 2023-02-10 DIAGNOSIS — Z789 Other specified health status: Secondary | ICD-10-CM | POA: Diagnosis not present

## 2023-02-10 DIAGNOSIS — M47816 Spondylosis without myelopathy or radiculopathy, lumbar region: Secondary | ICD-10-CM

## 2023-02-10 DIAGNOSIS — Z79899 Other long term (current) drug therapy: Secondary | ICD-10-CM

## 2023-02-10 DIAGNOSIS — M5414 Radiculopathy, thoracic region: Secondary | ICD-10-CM

## 2023-02-10 DIAGNOSIS — M5134 Other intervertebral disc degeneration, thoracic region: Secondary | ICD-10-CM

## 2023-02-10 DIAGNOSIS — M7138 Other bursal cyst, other site: Secondary | ICD-10-CM | POA: Diagnosis not present

## 2023-02-10 DIAGNOSIS — M8008XS Age-related osteoporosis with current pathological fracture, vertebra(e), sequela: Secondary | ICD-10-CM | POA: Diagnosis not present

## 2023-02-10 DIAGNOSIS — M546 Pain in thoracic spine: Secondary | ICD-10-CM | POA: Insufficient documentation

## 2023-02-10 DIAGNOSIS — M4316 Spondylolisthesis, lumbar region: Secondary | ICD-10-CM

## 2023-02-10 DIAGNOSIS — K635 Polyp of colon: Secondary | ICD-10-CM | POA: Insufficient documentation

## 2023-02-10 DIAGNOSIS — M5137 Other intervertebral disc degeneration, lumbosacral region: Secondary | ICD-10-CM

## 2023-02-10 NOTE — Patient Instructions (Signed)
____________________________________________________________________________________________  New Patients  Welcome to [facility].   Initial Visit The first or initial visit consists of an evaluation only.   Interventional pain management.  We offer therapies other than opioid controlled substances to manage chronic pain. These include, but are not limited to, diagnostic, therapeutic, and palliative specialized injection therapies (i.e.: Epidural Steroids, Facet Blocks, etc.). We specialize in a variety of nerve blocks as well as radiofrequency treatments. We offer pain implant evaluations and trials, as well as follow up management. In addition we also provide a variety joint injections, including Viscosupplementation (AKA: Gel Therapy).  Prescription Pain Medication. We specialize in alternatives to opioids. We can provide evaluations and recommendations for/of pharmacologic therapies based on CDC Guidelines.  We no longer take patients for long-term medication management. We will not be taking over your pain medications.  ____________________________________________________________________________________________    ____________________________________________________________________________________________  Patient Information update  To: All of our patients.  Re: Name change.  It has been made official that our current name, "[facility]"   will soon be changed to "[facility]".   The purpose of this change is to eliminate any confusion created by the concept of our practice being a "Medication Management Pain Clinic". In the past this has led to the misconception that we treat pain primarily by the use of prescription medications.  Nothing can be farther from the truth.   Understanding PAIN MANAGEMENT: To  further understand what our practice does, you first have to understand that "Pain Management" is a subspecialty that requires additional training once a physician has completed their specialty training, which can be in either Anesthesia, Neurology, Psychiatry, or Physical Medicine and Rehabilitation (PMR). Each one of these contributes to the final approach taken by each physician to the management of their patient's pain. To be a "Pain Management Specialist" you must have first completed one of the specialty trainings below.  Anesthesiologists - trained in clinical pharmacology and interventional techniques such as nerve blockade and regional as well as central neuroanatomy. They are trained to block pain before, during, and after surgical interventions.  Neurologists - trained in the diagnosis and pharmacological treatment of complex neurological conditions, such as Multiple Sclerosis, Parkinson's, spinal cord injuries, and other systemic conditions that may be associated with symptoms that may include but are not limited to pain. They tend to rely primarily on the treatment of chronic pain using prescription medications.  Psychiatrist - trained in conditions affecting the psychosocial wellbeing of patients including but not limited to depression, anxiety, schizophrenia, personality disorders, addiction, and other substance use disorders that may be associated with chronic pain. They tend to rely primarily on the treatment of chronic pain using prescription medications.   Physical Medicine and Rehabilitation (PMR) physicians, also known as physiatrists - trained to treat a wide variety of medical conditions affecting the brain, spinal cord, nerves, bones, joints, ligaments, muscles, and tendons. Their training is primarily aimed at treating patients that have suffered injuries that have caused severe physical impairment. Their training is primarily aimed at the physical therapy and rehabilitation of those  patients. They may also work alongside orthopedic surgeons or neurosurgeons using their expertise in assisting surgical patients to recover after their surgeries.  INTERVENTIONAL PAIN MANAGEMENT is sub-subspecialty of Pain Management.  Our physicians are Board-certified in Anesthesia, Pain Management, and Interventional Pain Management.  This meaning that not only have they been trained   and Board-certified in their specialty of Anesthesia, and subspecialty of Pain Management, but they have also received further training in the sub-subspecialty of Interventional Pain Management, in order to become Board-certified as INTERVENTIONAL PAIN MANAGEMENT SPECIALIST.    Mission: Our goal is to use our skills in  INTERVENTIONAL PAIN MANAGEMENT as alternatives to the chronic use of prescription opioid medications for the treatment of pain. To make this more clear, we have changed our name to reflect what we do and offer. We will continue to offer medication management assessment and recommendations, but we will not be taking over any patient's medication management.  ____________________________________________________________________________________________     

## 2023-02-10 NOTE — Progress Notes (Signed)
Safety precautions to be maintained throughout the outpatient stay will include: orient to surroundings, keep bed in low position, maintain call bell within reach at all times, provide assistance with transfer out of bed and ambulation.  

## 2023-02-10 NOTE — Progress Notes (Signed)
Patient: Molly [name]  Service Category: E/M  Provider: Gaspar Cola, MD  DOB: 12/30/1940  DOS: 02/10/2023  Referring Provider: Idelle Crouch, MD  MRN: [id number]  Setting: Ambulatory outpatient  PCP: Idelle Crouch, MD  Type: New Patient  Specialty: Interventional Pain Management    Location: Office  Delivery: Face-to-face     Primary Reason(s) for Visit: Encounter for initial evaluation of one or more chronic problems (new to examiner) potentially causing chronic pain, and posing a threat to normal musculoskeletal function. (Level of risk: High) CC: Back Pain  HPI  Molly [name] is a 82 y.o. year old, female patient, who comes for the first time to our practice referred by Idelle Crouch, MD for our initial evaluation of her chronic pain. She has DIABETES MELLITUS, TYPE II; HYPERLIPIDEMIA; GOUT; SINUSITIS - ACUTE-NOS; GERD (gastroesophageal reflux disease); IBS; Chronic thoracic back pain (2ry area of Pain) (Bilateral); SLEEP DISORDER, CHRONIC; Palpitations; Acute pancreatitis; Fatty liver disease, nonalcoholic; HTN (hypertension); Diabetes; Hyperlipidemia; Pancreatitis, recurrent; Hypertension; Constipation; Acute respiratory failure with hypoxia; Abnormal MRI, lumbar spine (10/17/2022); Chronic recurrent pancreatitis; Osteopenia; Post-menopausal osteoporosis; Drug-induced acute pancreatitis without infection or necrosis; Type 2 diabetes mellitus; Hx of adenomatous colonic polyps; H/O acute pancreatitis; Colon polyp; Abnormal TSH; Chronic pain syndrome; Pharmacologic therapy; Disorder of skeletal system; Problems influencing health status; Chronic low back pain (1ry area of Pain) (Bilateral) w/o sciatica; Compression fracture of L1 lumbar vertebra, sequela; History of kyphoplasty (L1); Radicular pain of thoracic region; Compression fracture of L2 lumbar vertebra, sequela; Grade 1 Retrolisthesis of L2/L3 and L3/L4; Lumbar facet arthropathy (Multilevel) (Bilateral); Synovial cyst of lumbar facet  joint (L5-S1) (Left); DDD (degenerative disc disease), lumbosacral; Age-related osteoporosis with current pathological fracture, vertebra(e), sequela; and DDD (degenerative disc disease), thoracic on their problem list. Today she comes in for evaluation of her Back Pain  Pain Assessment: Location: Mid, Lower, Right, Left Back Radiating: Radaites from lower back into hips bilateral and then from lower back uptowards the mid spine in betweem shoulder blade area Onset: More than a month ago Duration: Chronic pain Quality: Constant, Aching, Nagging Severity: 6 /10 (subjective, self-reported pain score)  Effect on ADL: "it is very limiting and slows me down" Timing: Constant Modifying factors: Fentanyl patch, PT, and laying in recliner BP: (!) 153/55  HR: 60  Onset and Duration: Sudden, Date of injury: 10/02/22, and Present longer than 3 months Cause of pain: Trauma Severity: No change since onset, NAS-11 at its worse: 10/10, NAS-11 at its best: 4/10, NAS-11 now: 5/10, and NAS-11 on the average: 7/10 Timing: Afternoon Aggravating Factors: Bending, Climbing, Kneeling, Lifiting, Prolonged standing, Squatting, and Working Alleviating Factors: Medications, Resting, and Sleeping Associated Problems: Fatigue and Weakness Quality of Pain: Constant, Distressing, Exhausting, Nagging, and Uncomfortable Previous Examinations or Tests: MRI scan and X-rays Previous Treatments: Stretching exercises  Molly [name] is being evaluated for possible interventional pain management therapies for the treatment of her chronic pain.  This patient was an add-on to my schedule today.  According to the patient the primary area of pain is that of the lower back (Bilateral) (R>L).  The patient denies any prior back surgeries except for a recent L1 kyphoplasty.  The patient also indicates having had a fairly recent MRI of the lumbar spine and other x-rays which apparently were done at a [facility] facility.  She denies any nerve  blocks but does admit to doing physical therapy which has consisted of 2 visits per week for the past 1 month.  She  refers that this seems to help to a certain extent.  She refers having back pain that dates back to approximately 3 years, but around November 2023 she had an episode that triggered a sharp lower back pain that would radiate towards the right lower back onto the area of the lateral for the acute episode she had an IM injection of steroids which did not really help.  She was found to have an L1 compression fracture which was treated by a kyphoplasty.  Medially after the kyphoplasty the pain improved, but about a week later it started getting bad again.  She denies any lower extremity pain, numbness, or weakness.  She will occasionally get referred pain towards the buttocks but it does not seem to go any further down.  An MRI of the lumbar spine done on 10/17/2022 demonstrated the L1 compression fracture to be approximately less than 10%.  On 11/19/2022 the patient had a percutaneous kyphoplasty of L1 done by Evette Cristal, MD.  Follow-up x-rays of the lumbar spine done on 12/04/2022 showed evidence of an L1 interval vertebroplasty/kyphoplasty, but no other fractures.  The patient indicated having had good relief of the pain for approximately 1 week after which the pain started to come back.  A follow-up diagnostic x-ray of the lumbar spine done on 12/17/2022 revealed a new L2 superior endplate deformity/fracture.  Today, while reviewing these x-rays, the patient indicated that she was not aware of this second L2 fracture.  The patient's secondary area pain is that of the thoracic back (Bilateral) (R>L).  Apparently this is relatively new and has not been fully evaluated as there are no x-rays of the area.  The patient has a medical history significant for gout, type 2 insulin-dependent diabetes mellitus, fatty liver disease, GERD, hypertension, IBS, osteopenia, and age-related osteoporosis.   However, the patient only takes calcium, vitamin D, and magnesium for the treatment of this osteoporosis.  Pharmacotherapy: According to the PMP, the patient has no stranger to opioid analgesics as she has evidence of having taken oxycodone, hydrocodone, and currently being on fentanyl patches.  Ms. Michaelides has been informed that this initial visit was an evaluation only.  On the follow up appointment I will go over the results, including ordered tests and available interventional therapies. At that time she will have the opportunity to decide whether to proceed with offered therapies or not. In the event that Ms. Kesinger prefers avoiding interventional options, this will conclude our involvement in the case.  Medication management recommendations may be provided upon request.  Historic Controlled Substance Pharmacotherapy Review  PMP and historical list of controlled substances: Fentanyl patch (Duragesic) 50 mcg/h, 1 every 72 hours (# 10) (last filled on 01/22/2023); hydrocodone/APAP 5/325, 1 tab p.o. twice daily (# 45) (last filled on 01/08/2023); oxycodone/APAP 5/325, 1 tab p.o. every 6 hours (# 30) (last filled on 10/07/2022). Most recently prescribed opioid analgesics:   Fentanyl patch (Duragesic) 50 mcg/h, 1 every 72 hours (#10/mo) MME/day: 120 mg/day  Historical Monitoring: The patient  reports no history of drug use. List of prior UDS Testing: No results found for: "MDMA", "COCAINSCRNUR", "PCPSCRNUR", "PCPQUANT", "CANNABQUANT", "THCU", "ETH", "CBDTHCR", "D8THCCBX", "D9THCCBX" Historical Background Evaluation: [place] PMP: PDMP reviewed during this encounter. Review of the past 11-months conducted.             PMP NARX Score Report:  Narcotic: 431 Sedative: 190 Stimulant: 000 [place] Department of public safety, offender search: Editor, commissioning Information) Non-contributory Risk Assessment Profile: Aberrant behavior: None observed or  detected today Risk factors for fatal opioid overdose: None identified  today PMP NARX Overdose Risk Score: 160 Fatal overdose hazard ratio (HR): Calculation deferred Non-fatal overdose hazard ratio (HR): Calculation deferred Risk of opioid abuse or dependence: 0.7-3.0% with doses ? 36 MME/day and 6.1-26% with doses ? 120 MME/day. Substance use disorder (SUD) risk level: See below Personal History of Substance Abuse (SUD-Substance use disorder):  Alcohol: Negative  Illegal Drugs: Negative  Rx Drugs: Negative  ORT Risk Level calculation: Low Risk  Opioid Risk Tool - 02/10/23 1319       Family History of Substance Abuse   Alcohol Negative    Illegal Drugs Negative    Rx Drugs Negative      Personal History of Substance Abuse   Alcohol Negative    Illegal Drugs Negative    Rx Drugs Negative      Age   Age between 26-45 years  No      Psychological Disease   Psychological Disease Negative    Depression Negative      Total Score   Opioid Risk Tool Scoring 0    Opioid Risk Interpretation Low Risk            ORT Scoring interpretation table:  Score <3 = Low Risk for SUD  Score between 4-7 = Moderate Risk for SUD  Score >8 = High Risk for Opioid Abuse   PHQ-2 Depression Scale:  Total score: 0  PHQ-2 Scoring interpretation table: (Score and probability of major depressive disorder)  Score 0 = No depression  Score 1 = 15.4% Probability  Score 2 = 21.1% Probability  Score 3 = 38.4% Probability  Score 4 = 45.5% Probability  Score 5 = 56.4% Probability  Score 6 = 78.6% Probability   PHQ-9 Depression Scale:  Total score: 0  PHQ-9 Scoring interpretation table:  Score 0-4 = No depression  Score 5-9 = Mild depression  Score 10-14 = Moderate depression  Score 15-19 = Moderately severe depression  Score 20-27 = Severe depression (2.4 times higher risk of SUD and 2.89 times higher risk of overuse)   Pharmacologic Plan: As per protocol, I have not taken over any controlled substance management, pending the results of ordered tests and/or  consults.            Initial impression: Pending review of available data and ordered tests.  Meds   Current Outpatient Medications:    Biotin 2500 MCG CAPS, Take by mouth 2 (two) times daily., Disp: , Rfl:    CALCIUM PO, Take 750 mg by mouth 2 (two) times daily., Disp: , Rfl:    Cholecalciferol (VITAMIN D PO), Take 500 Units by mouth 2 (two) times daily., Disp: , Rfl:    Cyanocobalamin (VITAMIN B 12 PO), Take 1 tablet by mouth daily., Disp: , Rfl:    fentaNYL (DURAGESIC) 50 MCG/HR, Place onto the skin., Disp: , Rfl:    glipiZIDE (GLUCOTROL XL) 10 MG 24 hr tablet, Take 10 mg by mouth 2 (two) times daily., Disp: , Rfl:    glucose blood test strip, 1 each by Other route as directed. Use as instructed, Disp: , Rfl:    HYDROmorphone (DILAUDID) 4 MG tablet, Take 4 mg by mouth as directed., Disp: , Rfl:    ibuprofen (ADVIL) 200 MG tablet, Take 400-600 mg by mouth every 6 (six) hours as needed., Disp: , Rfl:    insulin glargine (LANTUS SOLOSTAR) 100 UNIT/ML Solostar Pen, TAKE 48 UNITS ONCE DAILY. TAKE EACH MORNING., Disp: ,  Rfl:    ketoconazole (NIZORAL) 2 % cream, Apply to skin folds once daily, twice daily with flares., Disp: 60 g, Rfl: 5   Lancet Devices (ACCU-CHEK SOFTCLIX) lancets, 1 each by Other route as needed. Use as instructed, Disp: , Rfl:    lisinopril (ZESTRIL) 20 MG tablet, Take 20 mg by mouth daily., Disp: , Rfl:    magnesium oxide (MAG-OX) 400 MG tablet, Take 1 tablet by mouth daily., Disp: , Rfl:    metFORMIN (GLUCOPHAGE-XR) 500 MG 24 hr tablet, Take 1,000 mg by mouth 2 (two) times daily., Disp: , Rfl: 1   metoprolol tartrate (LOPRESSOR) 50 MG tablet, Take 50 mg by mouth 2 (two) times daily., Disp: , Rfl:    pantoprazole (PROTONIX) 40 MG tablet, Take 40 mg by mouth 2 (two) times daily., Disp: , Rfl:    pimecrolimus (ELIDEL) 1 % cream, Apply to buttocks area twice daily until improved., Disp: 30 g, Rfl: 0   potassium chloride (KLOR-CON) 20 MEQ packet, Take by mouth 2 (two) times  daily., Disp: , Rfl:    promethazine (PHENERGAN) 25 MG tablet, Take 25-50 mg by mouth every 8 (eight) hours as needed., Disp: , Rfl:    simvastatin (ZOCOR) 20 MG tablet, Take 0.5 tablets (10 mg total) by mouth at bedtime. (Patient taking differently: Take 20 mg by mouth at bedtime.), Disp: 15 tablet, Rfl: 0  Imaging Review  Lumbosacral Imaging: Lumbar MR wo contrast: Results for orders placed during the hospital encounter of 10/17/22 MR LUMBAR SPINE WO CONTRAST  Narrative CLINICAL DATA:  Low back pain for 6 months  EXAM: MRI LUMBAR SPINE WITHOUT CONTRAST  TECHNIQUE: Multiplanar, multisequence MR imaging of the lumbar spine was performed. No intravenous contrast was administered.  COMPARISON:  None Available.  FINDINGS: Segmentation:  Standard.  Alignment:  Minimal retrolisthesis of L2 on L3 and L3 on L4.  Vertebrae: No aggressive osseous lesion. Mild L1 vertebral body compression fracture with less than 10% anterior height loss and marrow edema along the inferior endplate.  Conus medullaris and cauda equina: Conus extends to the L1 level. Conus and cauda equina appear normal.  Paraspinal and other soft tissues: No acute paraspinal abnormality.  Disc levels:  Disc spaces: Disc desiccation throughout the lumbar spine. Degenerative disease with disc height loss at T12-L1 and to lesser extent L1-2 and L3-4.  T12-L1: Tiny right paracentral disc protrusion. No foraminal or central canal stenosis.  L1-L2: Mild broad-based disc bulge. Mild bilateral facet arthropathy. No foraminal or central canal stenosis.  L2-L3: Mild broad-based disc bulge. No foraminal or central canal stenosis.  L3-L4: Mild broad-based disc bulge. Mild bilateral facet arthropathy. No foraminal or central canal stenosis.  L4-L5: Mild broad-based disc bulge. Mild bilateral facet arthropathy. No foraminal central canal stenosis.  L5-S1: Mild broad-based disc bulge. Mild left facet arthropathy with a  8 mm left facet synovial cyst projecting into the left neural foramen with mass effect on the left L5 nerve root. No right foraminal stenosis. No spinal stenosis.  IMPRESSION: 1. Acute-subacute L1 vertebral body compression fracture with less than 10% anterior height loss. 2. At L5-S1 there is a mild broad-based disc bulge. Mild left facet arthropathy with a 8 mm left facet synovial cyst projecting into the left neural foramen with mass effect on the left L5 nerve root. 3. Mild lumbar spine spondylosis as described above.   Electronically Signed By: Kathreen Devoid M.D. On: 10/17/2022 14:35  Complexity Note: Imaging results reviewed.  ROS  Cardiovascular: High blood pressure Pulmonary or Respiratory: No reported pulmonary signs or symptoms such as wheezing and difficulty taking a deep full breath (Asthma), difficulty blowing air out (Emphysema), coughing up mucus (Bronchitis), persistent dry cough, or temporary stoppage of breathing during sleep Neurological: No reported neurological signs or symptoms such as seizures, abnormal skin sensations, urinary and/or fecal incontinence, being born with an abnormal open spine and/or a tethered spinal cord Psychological-Psychiatric: No reported psychological or psychiatric signs or symptoms such as difficulty sleeping, anxiety, depression, delusions or hallucinations (schizophrenial), mood swings (bipolar disorders) or suicidal ideations or attempts Gastrointestinal: No reported gastrointestinal signs or symptoms such as vomiting or evacuating blood, reflux, heartburn, alternating episodes of diarrhea and constipation, inflamed or scarred liver, or pancreas or irrregular and/or infrequent bowel movements Genitourinary: No reported renal or genitourinary signs or symptoms such as difficulty voiding or producing urine, peeing blood, non-functioning kidney, kidney stones, difficulty emptying the bladder, difficulty controlling the  flow of urine, or chronic kidney disease Hematological: No reported hematological signs or symptoms such as prolonged bleeding, low or poor functioning platelets, bruising or bleeding easily, hereditary bleeding problems, low energy levels due to low hemoglobin or being anemic Endocrine: High blood sugar requiring insulin (IDDM) Rheumatologic: No reported rheumatological signs and symptoms such as fatigue, joint pain, tenderness, swelling, redness, heat, stiffness, decreased range of motion, with or without associated rash Musculoskeletal: Negative for myasthenia gravis, muscular dystrophy, multiple sclerosis or malignant hyperthermia Work History: Retired  Allergies  Ms. Sylvestre is allergic to alendronate, codeine, lansoprazole, sulfonamide derivatives, and adhesive [tape].  Laboratory Chemistry Profile   Renal Lab Results  Component Value Date   BUN 7 (L) 01/30/2021   CREATININE 0.55 01/30/2021   GFRAA >60 08/11/2019   GFRNONAA >60 01/30/2021   PROTEINUR NEGATIVE 01/25/2021     Electrolytes Lab Results  Component Value Date   NA 136 01/30/2021   K 3.2 (L) 01/30/2021   CL 104 01/30/2021   CALCIUM 7.8 (L) 01/30/2021   MG 2.1 01/30/2021   PHOS 5.7 (H) 08/10/2019     Hepatic Lab Results  Component Value Date   AST 16 01/30/2021   ALT 11 01/30/2021   ALBUMIN 2.7 (L) 01/30/2021   ALKPHOS 40 01/30/2021   LIPASE 29 01/27/2021     ID Lab Results  Component Value Date   SARSCOV2NAA NEGATIVE 01/25/2021     Bone Lab Results  Component Value Date   VD25OH 35 03/26/2010     Endocrine Lab Results  Component Value Date   GLUCOSE 182 (H) 01/30/2021   GLUCOSEU 150 (A) 01/25/2021   HGBA1C 8.1 (H) 01/25/2021   TSH 3.57 08/15/2009     Neuropathy Lab Results  Component Value Date   HGBA1C 8.1 (H) 01/25/2021     CNS No results found for: "COLORCSF", "APPEARCSF", "RBCCOUNTCSF", "WBCCSF", "POLYSCSF", "LYMPHSCSF", "EOSCSF", "PROTEINCSF", "GLUCCSF", "JCVIRUS", "CSFOLI",  "IGGCSF", "LABACHR", "ACETBL"   Inflammation (CRP: Acute  ESR: Chronic) Lab Results  Component Value Date   LATICACIDVEN 3.1 ([place]) 01/25/2021     Rheumatology Lab Results  Component Value Date   LABURIC 6.5 08/15/2009     Coagulation Lab Results  Component Value Date   INR 1.13 03/19/2018   LABPROT 14.4 03/19/2018   PLT 236 01/30/2021     Cardiovascular Lab Results  Component Value Date   HGB 9.6 (L) 01/30/2021   HCT 30.0 (L) 01/30/2021     Screening Lab Results  Component Value Date   [place] NEGATIVE 01/25/2021  Cancer No results found for: "CEA", "CA125", "LABCA2"   Allergens No results found for: "ALMOND", "APPLE", "ASPARAGUS", "AVOCADO", "BANANA", "BARLEY", "BASIL", "BAYLEAF", "GREENBEAN", "LIMABEAN", "WHITEBEAN", "BEEFIGE", "REDBEET", "BLUEBERRY", "BROCCOLI", "CABBAGE", "MELON", "CARROT", "CASEIN", "CASHEWNUT", "CAULIFLOWER", "CELERY"     Note: Lab results reviewed.  PFSH  Drug: Ms. Pendergast  reports no history of drug use. Alcohol:  reports that she does not currently use alcohol. Tobacco:  reports that she quit smoking about 32 years ago. Her smoking use included cigarettes. She has never used smokeless tobacco. Medical:  has a past medical history of Arrhythmia, Arthritis, Basal cell carcinoma, Cancer, CMV (cytomegalovirus infection), Colon polyps, Diabetes mellitus, type 2, Duodenitis, Dysrhythmia, Fatty liver disease, nonalcoholic, FH: colonic polyps, GERD (gastroesophageal reflux disease), Heart murmur, Hemorrhoid (.), Hyperlipidemia, Hypertension, IBS (irritable bowel syndrome), Osteoporosis, Osteoporosis, and Pancreatitis. Family: family history includes Coronary artery disease in her father and mother; Diabetes in her mother; Heart attack (age of onset: 45) in her mother; Heart failure in her father and mother.  Past Surgical History:  Procedure Laterality Date   ABDOMINAL HYSTERECTOMY     Carotids  3/08   mild only   CATARACT EXTRACTION W/PHACO  Right 06/29/2017   Procedure: CATARACT EXTRACTION PHACO AND INTRAOCULAR LENS PLACEMENT ([place])  Toric Diabetic Right;  Surgeon: Eulogio Bear, MD;  Location: [place];  Service: Ophthalmology;  Laterality: Right;  diabetic - oral meds   CATARACT EXTRACTION W/PHACO Right 07/27/2017   Procedure: CATARACT EXTRACTION PHACO AND INTRAOCULAR LENS PLACEMENT ([place]) LEFT DIABETIC TORIC;  Surgeon: Eulogio Bear, MD;  Location: [place];  Service: Ophthalmology;  Laterality: Right;  Diabetic - oral meds   COLONOSCOPY     COLONOSCOPY WITH PROPOFOL N/A 07/20/2019   Procedure: COLONOSCOPY WITH PROPOFOL;  Surgeon: Tol[place], Benay Pike, MD;  Location: [facility] ENDOSCOPY;  Service: Gastroenterology;  Laterality: N/A;   cystocele, cystourethropexy  12/87   dexa     increase T - 3.1 spine, normal femur 1/04   ESOPHAGOGASTRODUODENOSCOPY     ESOPHAGOGASTRODUODENOSCOPY (EGD) WITH PROPOFOL N/A 04/16/2018   Procedure: ESOPHAGOGASTRODUODENOSCOPY (EGD) WITH PROPOFOL;  Surgeon: Manya Silvas, MD;  Location: [facility] ENDOSCOPY;  Service: Endoscopy;  Laterality: N/A;   ESOPHAGOGASTRODUODENOSCOPY (EGD) WITH PROPOFOL N/A 07/20/2019   Procedure: ESOPHAGOGASTRODUODENOSCOPY (EGD) WITH PROPOFOL;  Surgeon: Toledo, Benay Pike, MD;  Location: [facility] ENDOSCOPY;  Service: Gastroenterology;  Laterality: N/A;   EYE SURGERY     hysterectomy (other)  [place]   vaginal deliveries     x2   Active Ambulatory Problems    Diagnosis Date Noted   DIABETES MELLITUS, TYPE II 07/20/2007   HYPERLIPIDEMIA 07/20/2007   GOUT 04/03/2009   SINUSITIS - ACUTE-NOS 08/30/2009   GERD (gastroesophageal reflux disease) 09/08/2008   IBS 07/20/2007   Chronic thoracic back pain (2ry area of Pain) (Bilateral) 10/01/2010   SLEEP DISORDER, CHRONIC 08/30/2009   Palpitations 12/14/2007   Acute pancreatitis 03/18/2018   Fatty liver disease, nonalcoholic [id number]   HTN (hypertension) 06/06/2014   Diabetes 06/06/2014    Hyperlipidemia 06/06/2014   Pancreatitis, recurrent 01/25/2021   Hypertension    Constipation 01/26/2021   Acute respiratory failure with hypoxia 01/29/2021   Abnormal MRI, lumbar spine (10/17/2022) 02/10/2023   Chronic recurrent pancreatitis 05/07/2021   Osteopenia 06/06/2014   Post-menopausal osteoporosis 05/16/2019   Drug-induced acute pancreatitis without infection or necrosis 03/26/2018   Type 2 diabetes mellitus 12/24/2016   Hx of adenomatous colonic polyps 05/16/2019   H/O acute pancreatitis 04/01/2018  Colon polyp 02/10/2023   Abnormal TSH 06/29/2015   Chronic pain syndrome 02/10/2023   Pharmacologic therapy 02/10/2023   Disorder of skeletal system 02/10/2023   Problems influencing health status 02/10/2023   Chronic low back pain (1ry area of Pain) (Bilateral) w/o sciatica 02/10/2023   Compression fracture of L1 lumbar vertebra, sequela 02/10/2023   History of kyphoplasty (L1) 02/10/2023   Radicular pain of thoracic region 02/10/2023   Compression fracture of L2 lumbar vertebra, sequela 02/10/2023   Grade 1 Retrolisthesis of L2/L3 and L3/L4 02/10/2023   Lumbar facet arthropathy (Multilevel) (Bilateral) 02/10/2023   Synovial cyst of lumbar facet joint (L5-S1) (Left) 02/10/2023   DDD (degenerative disc disease), lumbosacral 02/10/2023   Age-related osteoporosis with current pathological fracture, vertebra(e), sequela 02/10/2023   DDD (degenerative disc disease), thoracic 02/10/2023   Resolved Ambulatory Problems    Diagnosis Date Noted   Elevated lactic acid level 01/25/2021   Past Medical History:  Diagnosis Date   Arrhythmia    Arthritis    Basal cell carcinoma    Cancer    CMV (cytomegalovirus infection)    Colon polyps    Diabetes mellitus, type 2    Duodenitis    Dysrhythmia    FH: colonic polyps    Heart murmur    Hemorrhoid .   IBS (irritable bowel syndrome)    Osteoporosis    Osteoporosis    Pancreatitis    Constitutional Exam  General appearance:  Well nourished, well developed, and well hydrated. In no apparent acute distress Vitals:   02/10/23 1308  BP: (!) 153/55  Pulse: 60  Resp: 18  Temp: (!) 97.3 F (36.3 C)  TempSrc: Temporal  SpO2: 98%  Weight: 170 lb (77.1 kg)  Height: 5\' 4"  (1.626 m)   BMI Assessment: Estimated body mass index is 29.18 kg/m as calculated from the following:   Height as of this encounter: 5\' 4"  (1.626 m).   Weight as of this encounter: 170 lb (77.1 kg).  BMI interpretation table: BMI level Category Range association with higher incidence of chronic pain  <18 kg/m2 Underweight   18.5-24.9 kg/m2 Ideal body weight   25-29.9 kg/m2 Overweight Increased incidence by 20%  30-34.9 kg/m2 Obese (Class I) Increased incidence by 68%  35-39.9 kg/m2 Severe obesity (Class II) Increased incidence by 136%  >40 kg/m2 Extreme obesity (Class III) Increased incidence by 254%   Patient's current BMI Ideal Body weight  Body mass index is 29.18 kg/m. Ideal body weight: 54.7 kg (120 lb 9.5 oz) Adjusted ideal body weight: 63.7 kg (140 lb 5.7 oz)   BMI Readings from Last 4 Encounters:  02/10/23 29.18 kg/m  01/25/21 29.18 kg/m  08/11/19 30.23 kg/m  07/20/19 27.96 kg/m   Wt Readings from Last 4 Encounters:  02/10/23 170 lb (77.1 kg)  01/25/21 170 lb (77.1 kg)  08/11/19 181 lb 10.5 oz (82.4 kg)  07/20/19 168 lb (76.2 kg)    Psych/Mental status: Alert, oriented x 3 (person, place, & time)       Eyes: PERLA Respiratory: No evidence of acute respiratory distress  Assessment  Primary Diagnosis & Pertinent Problem List: The primary encounter diagnosis was Chronic pain syndrome. Diagnoses of Chronic low back pain (1ry area of Pain) (Bilateral) w/o sciatica, Chronic thoracic back pain (2ry area of Pain) (Bilateral), Compression fracture of L1 lumbar vertebra, sequela, Compression fracture of L2 lumbar vertebra, sequela, History of kyphoplasty (L1), Radicular pain of thoracic region, Grade 1 Retrolisthesis of L2/L3  and L3/L4, Lumbar facet arthropathy (  Multilevel) (Bilateral), Synovial cyst of lumbar facet joint (L5-S1) (Left), DDD (degenerative disc disease), lumbosacral, Abnormal MRI, lumbar spine (10/17/2022), Age-related osteoporosis with current pathological fracture, vertebra(e), sequela, Pharmacologic therapy, Disorder of skeletal system, Problems influencing health status, and DDD (degenerative disc disease), thoracic were also pertinent to this visit.  Visit Diagnosis (New problems to examiner): 1. Chronic pain syndrome   2. Chronic low back pain (1ry area of Pain) (Bilateral) w/o sciatica   3. Chronic thoracic back pain (2ry area of Pain) (Bilateral)   4. Compression fracture of L1 lumbar vertebra, sequela   5. Compression fracture of L2 lumbar vertebra, sequela   6. History of kyphoplasty (L1)   7. Radicular pain of thoracic region   8. Grade 1 Retrolisthesis of L2/L3 and L3/L4   9. Lumbar facet arthropathy (Multilevel) (Bilateral)   10. Synovial cyst of lumbar facet joint (L5-S1) (Left)   11. DDD (degenerative disc disease), lumbosacral   12. Abnormal MRI, lumbar spine (10/17/2022)   13. Age-related osteoporosis with current pathological fracture, vertebra(e), sequela   14. Pharmacologic therapy   15. Disorder of skeletal system   16. Problems influencing health status   17. DDD (degenerative disc disease), thoracic    Plan of Care (Initial workup plan)  Note: Ms. Organ was reminded that as per protocol, today's visit has been an evaluation only. We have not taken over the patient's controlled substance management.  Problem-specific plan: No problem-specific Assessment & Plan notes found for this encounter.  Lab Orders         Compliance Drug Analysis, Ur         Comp. Metabolic Panel (12)         Magnesium         Vitamin B12         Sedimentation rate         25-Hydroxy vitamin D Lcms D2+D3         C-reactive protein     Imaging Orders         DG Lumbar Spine Complete W/Bend      Referral Orders  No referral(s) requested today   Procedure Orders    No procedure(s) ordered today   Pharmacotherapy (current): Medications ordered:  No orders of the defined types were placed in this encounter.  Medications administered during this visit: Margo Brookhouse. [place] had no medications administered during this visit.   Analgesic Pharmacotherapy:  Opioid Analgesics: For patients currently taking or requesting to take opioid analgesics, in accordance with [place], we will assess their risks and indications for the use of these substances. After completing our evaluation, we may offer recommendations, but we no longer take patients for medication management. The prescribing physician will ultimately decide, based on his/her training and level of comfort whether to adopt any of the recommendations, including whether or not to prescribe such medicines.  Membrane stabilizer: To be determined at a later time  Muscle relaxant: To be determined at a later time  NSAID: To be determined at a later time  Other analgesic(s): To be determined at a later time   Interventional management options: Ms. Kirschman was informed that there is no guarantee that she would be a candidate for interventional therapies. The decision will be based on the results of diagnostic studies, as well as Ms. Marchetti's risk profile.  Procedure(s) under consideration:  Pending results of ordered studies      Interventional Therapies  Risk Factors  Considerations:  Advanced age  osteoporosis  with pathological fractures  T2IDDM  HTN  GERD  fatty liver     Planned  Pending:      Under consideration:   Diagnostic/therapeutic right L1-2 LESI #1  Therapeutic L2 and possibly T12 kyphoplasty    Completed:   None at this time   Completed by other providers:   Therapeutic L1 kyphoplasty (11/19/2022) by Christianne Borrow. Maximino Greenland, MD. ([facility])   Therapeutic  Palliative  (PRN) options:   None established   Pharmacotherapy  I would recommend aggressive treatment of osteoporosis and consider biphosphonate therapy.       Provider-requested follow-up: Return in about 2 weeks (around 02/24/2023) for Eval-day (M,W), (F2F), 2nd Visit, for review of ordered tests.  No future appointments.  Duration of encounter: 118 minutes.  Total time on encounter, as per AMA guidelines included both the face-to-face and non-face-to-face time personally spent by the physician and/or other qualified health care professional(s) on the day of the encounter (includes time in activities that require the physician or other qualified health care professional and does not include time in activities normally performed by clinical staff). Physician's time may include the following activities when performed: Preparing to see the patient (e.g., pre-charting review of records, searching for previously ordered imaging, lab work, and nerve conduction tests) Review of prior analgesic pharmacotherapies. Reviewing PMP Interpreting ordered tests (e.g., lab work, imaging, nerve conduction tests) Performing post-procedure evaluations, including interpretation of diagnostic procedures Obtaining and/or reviewing separately obtained history Performing a medically appropriate examination and/or evaluation Counseling and educating the patient/family/caregiver Ordering medications, tests, or procedures Referring and communicating with other health care professionals (when not separately reported) Documenting clinical information in the electronic or other health record Independently interpreting results (not separately reported) and communicating results to the patient/ family/caregiver Care coordination (not separately reported)  Note by: Gaspar Cola, MD (TTS technology used. I apologize for any typographical errors that were not detected and corrected.) Date: 02/10/2023; Time: 3:57 PM

## 2023-02-11 DIAGNOSIS — M545 Low back pain, unspecified: Secondary | ICD-10-CM | POA: Diagnosis not present

## 2023-02-11 DIAGNOSIS — R262 Difficulty in walking, not elsewhere classified: Secondary | ICD-10-CM | POA: Diagnosis not present

## 2023-02-11 DIAGNOSIS — M546 Pain in thoracic spine: Secondary | ICD-10-CM | POA: Diagnosis not present

## 2023-02-11 DIAGNOSIS — M62830 Muscle spasm of back: Secondary | ICD-10-CM | POA: Diagnosis not present

## 2023-02-12 ENCOUNTER — Encounter: Payer: Self-pay | Admitting: Pain Medicine

## 2023-02-12 DIAGNOSIS — S22080A Wedge compression fracture of T11-T12 vertebra, initial encounter for closed fracture: Secondary | ICD-10-CM | POA: Insufficient documentation

## 2023-02-13 DIAGNOSIS — M546 Pain in thoracic spine: Secondary | ICD-10-CM | POA: Diagnosis not present

## 2023-02-13 DIAGNOSIS — R262 Difficulty in walking, not elsewhere classified: Secondary | ICD-10-CM | POA: Diagnosis not present

## 2023-02-13 DIAGNOSIS — M62830 Muscle spasm of back: Secondary | ICD-10-CM | POA: Diagnosis not present

## 2023-02-13 DIAGNOSIS — M545 Low back pain, unspecified: Secondary | ICD-10-CM | POA: Diagnosis not present

## 2023-02-13 LAB — COMPLIANCE DRUG ANALYSIS, UR

## 2023-02-16 DIAGNOSIS — M62830 Muscle spasm of back: Secondary | ICD-10-CM | POA: Diagnosis not present

## 2023-02-16 DIAGNOSIS — R262 Difficulty in walking, not elsewhere classified: Secondary | ICD-10-CM | POA: Diagnosis not present

## 2023-02-16 DIAGNOSIS — M546 Pain in thoracic spine: Secondary | ICD-10-CM | POA: Diagnosis not present

## 2023-02-16 DIAGNOSIS — M545 Low back pain, unspecified: Secondary | ICD-10-CM | POA: Diagnosis not present

## 2023-02-16 LAB — C-REACTIVE PROTEIN: CRP: <1 mg/L (ref 0–10)

## 2023-02-16 LAB — VITAMIN B12: Vitamin B-12: >2000 pg/mL — ABNORMAL HIGH (ref 232–1245)

## 2023-02-16 LAB — 25-HYDROXY VITAMIN D LCMS D2+D3
25-Hydroxy, Vitamin D-2: <1 ng/mL
25-Hydroxy, Vitamin D-3: 44 ng/mL
25-Hydroxy, Vitamin D: 44 ng/mL

## 2023-02-16 LAB — COMP. METABOLIC PANEL (12)
AST: 35 IU/L (ref 0–40)
Albumin/Globulin Ratio: 2.1 (ref 1.2–2.2)
Albumin: 4.9 g/dL — ABNORMAL HIGH (ref 3.7–4.7)
Alkaline Phosphatase: 62 IU/L (ref 44–121)
BUN/Creatinine Ratio: 23 (ref 12–28)
BUN: 20 mg/dL (ref 8–27)
Bilirubin Total: 0.5 mg/dL (ref 0.0–1.2)
Calcium: 10.8 mg/dL — ABNORMAL HIGH (ref 8.7–10.3)
Chloride: 102 mmol/L (ref 96–106)
Creatinine, Ser: 0.87 mg/dL (ref 0.57–1.00)
Globulin, Total: 2.3 g/dL (ref 1.5–4.5)
Glucose: 217 mg/dL — ABNORMAL HIGH (ref 70–99)
Potassium: 5.9 mmol/L — ABNORMAL HIGH (ref 3.5–5.2)
Sodium: 143 mmol/L (ref 134–144)
Total Protein: 7.2 g/dL (ref 6.0–8.5)
eGFR: 67 mL/min/{1.73_m2} (ref >59)

## 2023-02-16 LAB — MAGNESIUM: Magnesium: 2.1 mg/dL (ref 1.6–2.3)

## 2023-02-16 LAB — SEDIMENTATION RATE: Sed Rate: 7 mm/hr (ref 0–40)

## 2023-02-17 DIAGNOSIS — E113293 Type 2 diabetes mellitus with mild nonproliferative diabetic retinopathy without macular edema, bilateral: Secondary | ICD-10-CM | POA: Diagnosis not present

## 2023-02-17 DIAGNOSIS — M810 Age-related osteoporosis without current pathological fracture: Secondary | ICD-10-CM | POA: Diagnosis not present

## 2023-02-17 DIAGNOSIS — E1129 Type 2 diabetes mellitus with other diabetic kidney complication: Secondary | ICD-10-CM | POA: Diagnosis not present

## 2023-02-17 DIAGNOSIS — Z794 Long term (current) use of insulin: Secondary | ICD-10-CM | POA: Diagnosis not present

## 2023-02-17 DIAGNOSIS — R809 Proteinuria, unspecified: Secondary | ICD-10-CM | POA: Diagnosis not present

## 2023-02-20 DIAGNOSIS — M546 Pain in thoracic spine: Secondary | ICD-10-CM | POA: Diagnosis not present

## 2023-02-20 DIAGNOSIS — M62830 Muscle spasm of back: Secondary | ICD-10-CM | POA: Diagnosis not present

## 2023-02-20 DIAGNOSIS — R262 Difficulty in walking, not elsewhere classified: Secondary | ICD-10-CM | POA: Diagnosis not present

## 2023-02-20 DIAGNOSIS — M545 Low back pain, unspecified: Secondary | ICD-10-CM | POA: Diagnosis not present

## 2023-02-24 DIAGNOSIS — M5136 Other intervertebral disc degeneration, lumbar region: Secondary | ICD-10-CM | POA: Diagnosis not present

## 2023-02-24 DIAGNOSIS — M47816 Spondylosis without myelopathy or radiculopathy, lumbar region: Secondary | ICD-10-CM | POA: Diagnosis not present

## 2023-02-27 DIAGNOSIS — M545 Low back pain, unspecified: Secondary | ICD-10-CM | POA: Diagnosis not present

## 2023-02-27 DIAGNOSIS — M62830 Muscle spasm of back: Secondary | ICD-10-CM | POA: Diagnosis not present

## 2023-02-27 DIAGNOSIS — R262 Difficulty in walking, not elsewhere classified: Secondary | ICD-10-CM | POA: Diagnosis not present

## 2023-02-27 DIAGNOSIS — M546 Pain in thoracic spine: Secondary | ICD-10-CM | POA: Diagnosis not present

## 2023-03-04 DIAGNOSIS — M545 Low back pain, unspecified: Secondary | ICD-10-CM | POA: Diagnosis not present

## 2023-03-04 DIAGNOSIS — M546 Pain in thoracic spine: Secondary | ICD-10-CM | POA: Diagnosis not present

## 2023-03-04 DIAGNOSIS — R262 Difficulty in walking, not elsewhere classified: Secondary | ICD-10-CM | POA: Diagnosis not present

## 2023-03-04 DIAGNOSIS — M62830 Muscle spasm of back: Secondary | ICD-10-CM | POA: Diagnosis not present

## 2023-03-11 DIAGNOSIS — M545 Low back pain, unspecified: Secondary | ICD-10-CM | POA: Diagnosis not present

## 2023-03-11 DIAGNOSIS — M546 Pain in thoracic spine: Secondary | ICD-10-CM | POA: Diagnosis not present

## 2023-03-11 DIAGNOSIS — R262 Difficulty in walking, not elsewhere classified: Secondary | ICD-10-CM | POA: Diagnosis not present

## 2023-03-11 DIAGNOSIS — M62830 Muscle spasm of back: Secondary | ICD-10-CM | POA: Diagnosis not present

## 2023-05-13 DIAGNOSIS — I10 Essential (primary) hypertension: Secondary | ICD-10-CM | POA: Diagnosis not present

## 2023-05-13 DIAGNOSIS — R519 Headache, unspecified: Secondary | ICD-10-CM | POA: Diagnosis not present

## 2023-05-13 DIAGNOSIS — R42 Dizziness and giddiness: Secondary | ICD-10-CM | POA: Diagnosis not present

## 2023-05-13 DIAGNOSIS — R5383 Other fatigue: Secondary | ICD-10-CM | POA: Diagnosis not present

## 2023-05-13 DIAGNOSIS — E118 Type 2 diabetes mellitus with unspecified complications: Secondary | ICD-10-CM | POA: Diagnosis not present

## 2023-05-18 ENCOUNTER — Other Ambulatory Visit: Payer: Self-pay | Admitting: Internal Medicine

## 2023-05-18 DIAGNOSIS — Z1231 Encounter for screening mammogram for malignant neoplasm of breast: Secondary | ICD-10-CM

## 2023-05-21 ENCOUNTER — Ambulatory Visit
Admission: RE | Admit: 2023-05-21 | Discharge: 2023-05-21 | Disposition: A | Discharge status: Home / Self Care | Payer: PPO | Source: Ambulatory Visit | Attending: Internal Medicine | Admitting: Internal Medicine

## 2023-05-21 DIAGNOSIS — Z1231 Encounter for screening mammogram for malignant neoplasm of breast: Secondary | ICD-10-CM | POA: Diagnosis not present

## 2023-05-26 DIAGNOSIS — M5380 Other specified dorsopathies, site unspecified: Secondary | ICD-10-CM | POA: Diagnosis not present

## 2023-05-26 DIAGNOSIS — M47816 Spondylosis without myelopathy or radiculopathy, lumbar region: Secondary | ICD-10-CM | POA: Diagnosis not present

## 2023-05-26 DIAGNOSIS — M5136 Other intervertebral disc degeneration, lumbar region: Secondary | ICD-10-CM | POA: Diagnosis not present

## 2023-07-23 DIAGNOSIS — E113213 Type 2 diabetes mellitus with mild nonproliferative diabetic retinopathy with macular edema, bilateral: Secondary | ICD-10-CM | POA: Diagnosis not present

## 2023-07-23 DIAGNOSIS — Z961 Presence of intraocular lens: Secondary | ICD-10-CM | POA: Diagnosis not present

## 2023-08-20 DIAGNOSIS — K219 Gastro-esophageal reflux disease without esophagitis: Secondary | ICD-10-CM | POA: Diagnosis not present

## 2023-08-20 DIAGNOSIS — E118 Type 2 diabetes mellitus with unspecified complications: Secondary | ICD-10-CM | POA: Diagnosis not present

## 2023-08-20 DIAGNOSIS — E1129 Type 2 diabetes mellitus with other diabetic kidney complication: Secondary | ICD-10-CM | POA: Diagnosis not present

## 2023-08-20 DIAGNOSIS — R0602 Shortness of breath: Secondary | ICD-10-CM | POA: Diagnosis not present

## 2023-08-20 DIAGNOSIS — M810 Age-related osteoporosis without current pathological fracture: Secondary | ICD-10-CM | POA: Diagnosis not present

## 2023-08-20 DIAGNOSIS — R8290 Unspecified abnormal findings in urine: Secondary | ICD-10-CM | POA: Diagnosis not present

## 2023-08-20 DIAGNOSIS — E113293 Type 2 diabetes mellitus with mild nonproliferative diabetic retinopathy without macular edema, bilateral: Secondary | ICD-10-CM | POA: Diagnosis not present

## 2023-08-20 DIAGNOSIS — Z794 Long term (current) use of insulin: Secondary | ICD-10-CM | POA: Diagnosis not present

## 2023-08-20 DIAGNOSIS — E7800 Pure hypercholesterolemia, unspecified: Secondary | ICD-10-CM | POA: Diagnosis not present

## 2023-08-20 DIAGNOSIS — Z79899 Other long term (current) drug therapy: Secondary | ICD-10-CM | POA: Diagnosis not present

## 2023-08-20 DIAGNOSIS — R809 Proteinuria, unspecified: Secondary | ICD-10-CM | POA: Diagnosis not present

## 2023-08-20 DIAGNOSIS — K861 Other chronic pancreatitis: Secondary | ICD-10-CM | POA: Diagnosis not present

## 2023-08-20 DIAGNOSIS — I10 Essential (primary) hypertension: Secondary | ICD-10-CM | POA: Diagnosis not present

## 2023-08-21 DIAGNOSIS — R8290 Unspecified abnormal findings in urine: Secondary | ICD-10-CM | POA: Diagnosis not present

## 2023-08-28 DIAGNOSIS — E875 Hyperkalemia: Secondary | ICD-10-CM | POA: Diagnosis not present

## 2023-08-28 DIAGNOSIS — Z79899 Other long term (current) drug therapy: Secondary | ICD-10-CM | POA: Diagnosis not present

## 2023-08-31 ENCOUNTER — Other Ambulatory Visit (HOSPITAL_COMMUNITY): Payer: Self-pay | Admitting: Neuroradiology

## 2023-08-31 ENCOUNTER — Emergency Department: Payer: PPO

## 2023-08-31 ENCOUNTER — Inpatient Hospital Stay (HOSPITAL_COMMUNITY): Payer: PPO | Admitting: Certified Registered Nurse Anesthetist

## 2023-08-31 ENCOUNTER — Other Ambulatory Visit: Payer: Self-pay

## 2023-08-31 ENCOUNTER — Ambulatory Visit: Payer: PPO | Attending: Student in an Organized Health Care Education/Training Program

## 2023-08-31 ENCOUNTER — Inpatient Hospital Stay: Payer: PPO

## 2023-08-31 ENCOUNTER — Inpatient Hospital Stay (HOSPITAL_COMMUNITY)
Admission: AD | Admit: 2023-08-31 | Discharge: 2023-09-06 | DRG: 024 | Disposition: A | Discharge status: Home Health | Payer: PPO | Source: Other Acute Inpatient Hospital | Attending: Neurology | Admitting: Neurology

## 2023-08-31 ENCOUNTER — Inpatient Hospital Stay
Admission: EM | Admit: 2023-08-31 | Discharge: 2023-08-31 | DRG: 062 | Disposition: A | Discharge status: Other Acute Inpatient Hospital | Payer: PPO | Attending: Student in an Organized Health Care Education/Training Program | Admitting: Student in an Organized Health Care Education/Training Program

## 2023-08-31 ENCOUNTER — Ambulatory Visit (HOSPITAL_COMMUNITY)
Admission: RE | Admit: 2023-08-31 | Discharge: 2023-08-31 | Disposition: A | Discharge status: Home / Self Care | Payer: PPO | Source: Ambulatory Visit | Attending: Neuroradiology

## 2023-08-31 ENCOUNTER — Encounter (HOSPITAL_COMMUNITY)
Admission: AD | Disposition: A | Discharge status: Home / Self Care | Payer: Self-pay | Source: Ambulatory Visit | Attending: Neurology

## 2023-08-31 DIAGNOSIS — I10 Essential (primary) hypertension: Secondary | ICD-10-CM | POA: Diagnosis not present

## 2023-08-31 DIAGNOSIS — E119 Type 2 diabetes mellitus without complications: Secondary | ICD-10-CM | POA: Diagnosis not present

## 2023-08-31 DIAGNOSIS — R29810 Facial weakness: Secondary | ICD-10-CM | POA: Diagnosis not present

## 2023-08-31 DIAGNOSIS — I639 Cerebral infarction, unspecified: Secondary | ICD-10-CM

## 2023-08-31 DIAGNOSIS — R29818 Other symptoms and signs involving the nervous system: Secondary | ICD-10-CM | POA: Diagnosis not present

## 2023-08-31 DIAGNOSIS — I6389 Other cerebral infarction: Secondary | ICD-10-CM | POA: Diagnosis not present

## 2023-08-31 DIAGNOSIS — I499 Cardiac arrhythmia, unspecified: Secondary | ICD-10-CM | POA: Diagnosis present

## 2023-08-31 DIAGNOSIS — R471 Dysarthria and anarthria: Secondary | ICD-10-CM | POA: Diagnosis not present

## 2023-08-31 DIAGNOSIS — I700 Atherosclerosis of aorta: Secondary | ICD-10-CM | POA: Diagnosis not present

## 2023-08-31 DIAGNOSIS — Z87891 Personal history of nicotine dependence: Secondary | ICD-10-CM

## 2023-08-31 DIAGNOSIS — R29704 NIHSS score 4: Secondary | ICD-10-CM | POA: Diagnosis present

## 2023-08-31 DIAGNOSIS — Z8673 Personal history of transient ischemic attack (TIA), and cerebral infarction without residual deficits: Secondary | ICD-10-CM

## 2023-08-31 DIAGNOSIS — Z8249 Family history of ischemic heart disease and other diseases of the circulatory system: Secondary | ICD-10-CM | POA: Diagnosis not present

## 2023-08-31 DIAGNOSIS — Z7984 Long term (current) use of oral hypoglycemic drugs: Secondary | ICD-10-CM | POA: Diagnosis not present

## 2023-08-31 DIAGNOSIS — D72829 Elevated white blood cell count, unspecified: Secondary | ICD-10-CM | POA: Diagnosis present

## 2023-08-31 DIAGNOSIS — I63512 Cerebral infarction due to unspecified occlusion or stenosis of left middle cerebral artery: Secondary | ICD-10-CM | POA: Diagnosis not present

## 2023-08-31 DIAGNOSIS — Z85828 Personal history of other malignant neoplasm of skin: Secondary | ICD-10-CM

## 2023-08-31 DIAGNOSIS — Z90710 Acquired absence of both cervix and uterus: Secondary | ICD-10-CM | POA: Diagnosis not present

## 2023-08-31 DIAGNOSIS — Z79899 Other long term (current) drug therapy: Secondary | ICD-10-CM

## 2023-08-31 DIAGNOSIS — Z6831 Body mass index (BMI) 31.0-31.9, adult: Secondary | ICD-10-CM | POA: Diagnosis not present

## 2023-08-31 DIAGNOSIS — Z833 Family history of diabetes mellitus: Secondary | ICD-10-CM | POA: Diagnosis not present

## 2023-08-31 DIAGNOSIS — G8191 Hemiplegia, unspecified affecting right dominant side: Secondary | ICD-10-CM | POA: Diagnosis present

## 2023-08-31 DIAGNOSIS — I6602 Occlusion and stenosis of left middle cerebral artery: Secondary | ICD-10-CM

## 2023-08-31 DIAGNOSIS — R29724 NIHSS score 24: Secondary | ICD-10-CM | POA: Diagnosis present

## 2023-08-31 DIAGNOSIS — R29718 NIHSS score 18: Secondary | ICD-10-CM | POA: Diagnosis present

## 2023-08-31 DIAGNOSIS — Z885 Allergy status to narcotic agent status: Secondary | ICD-10-CM

## 2023-08-31 DIAGNOSIS — Z83719 Family history of colon polyps, unspecified: Secondary | ICD-10-CM

## 2023-08-31 DIAGNOSIS — Z91048 Other nonmedicinal substance allergy status: Secondary | ICD-10-CM

## 2023-08-31 DIAGNOSIS — R29707 NIHSS score 7: Secondary | ICD-10-CM | POA: Diagnosis present

## 2023-08-31 DIAGNOSIS — Z888 Allergy status to other drugs, medicaments and biological substances status: Secondary | ICD-10-CM | POA: Diagnosis not present

## 2023-08-31 DIAGNOSIS — Z882 Allergy status to sulfonamides status: Secondary | ICD-10-CM | POA: Diagnosis not present

## 2023-08-31 DIAGNOSIS — R1310 Dysphagia, unspecified: Secondary | ICD-10-CM | POA: Diagnosis not present

## 2023-08-31 DIAGNOSIS — Z860100 Personal history of colon polyps, unspecified: Secondary | ICD-10-CM | POA: Diagnosis not present

## 2023-08-31 DIAGNOSIS — E669 Obesity, unspecified: Secondary | ICD-10-CM | POA: Diagnosis present

## 2023-08-31 DIAGNOSIS — K5900 Constipation, unspecified: Secondary | ICD-10-CM | POA: Diagnosis not present

## 2023-08-31 DIAGNOSIS — D689 Coagulation defect, unspecified: Secondary | ICD-10-CM | POA: Diagnosis present

## 2023-08-31 DIAGNOSIS — Z9282 Status post administration of tPA (rtPA) in a different facility within the last 24 hours prior to admission to current facility: Secondary | ICD-10-CM | POA: Diagnosis not present

## 2023-08-31 DIAGNOSIS — E1165 Type 2 diabetes mellitus with hyperglycemia: Secondary | ICD-10-CM | POA: Diagnosis not present

## 2023-08-31 DIAGNOSIS — K760 Fatty (change of) liver, not elsewhere classified: Secondary | ICD-10-CM | POA: Diagnosis present

## 2023-08-31 DIAGNOSIS — Z794 Long term (current) use of insulin: Secondary | ICD-10-CM

## 2023-08-31 DIAGNOSIS — E785 Hyperlipidemia, unspecified: Secondary | ICD-10-CM | POA: Diagnosis present

## 2023-08-31 DIAGNOSIS — R4701 Aphasia: Secondary | ICD-10-CM | POA: Diagnosis present

## 2023-08-31 DIAGNOSIS — I6502 Occlusion and stenosis of left vertebral artery: Secondary | ICD-10-CM | POA: Diagnosis not present

## 2023-08-31 DIAGNOSIS — M810 Age-related osteoporosis without current pathological fracture: Secondary | ICD-10-CM | POA: Diagnosis not present

## 2023-08-31 DIAGNOSIS — H53461 Homonymous bilateral field defects, right side: Secondary | ICD-10-CM | POA: Diagnosis present

## 2023-08-31 DIAGNOSIS — R531 Weakness: Secondary | ICD-10-CM

## 2023-08-31 DIAGNOSIS — I672 Cerebral atherosclerosis: Secondary | ICD-10-CM | POA: Diagnosis not present

## 2023-08-31 DIAGNOSIS — K219 Gastro-esophageal reflux disease without esophagitis: Secondary | ICD-10-CM | POA: Diagnosis present

## 2023-08-31 DIAGNOSIS — I69314 Frontal lobe and executive function deficit following cerebral infarction: Secondary | ICD-10-CM | POA: Diagnosis not present

## 2023-08-31 DIAGNOSIS — I663 Occlusion and stenosis of cerebellar arteries: Secondary | ICD-10-CM | POA: Diagnosis not present

## 2023-08-31 DIAGNOSIS — I6523 Occlusion and stenosis of bilateral carotid arteries: Secondary | ICD-10-CM | POA: Diagnosis not present

## 2023-08-31 DIAGNOSIS — I6782 Cerebral ischemia: Secondary | ICD-10-CM | POA: Diagnosis not present

## 2023-08-31 DIAGNOSIS — I63523 Cerebral infarction due to unspecified occlusion or stenosis of bilateral anterior cerebral arteries: Secondary | ICD-10-CM | POA: Diagnosis present

## 2023-08-31 HISTORY — PX: IR PERCUTANEOUS ART THROMBECTOMY/INFUSION INTRACRANIAL INC DIAG ANGIO: IMG6087

## 2023-08-31 HISTORY — PX: IR US GUIDE VASC ACCESS RIGHT: IMG2390

## 2023-08-31 HISTORY — PX: IR CT HEAD LTD: IMG2386

## 2023-08-31 HISTORY — PX: RADIOLOGY WITH ANESTHESIA: SHX6223

## 2023-08-31 LAB — COMPREHENSIVE METABOLIC PANEL
ALT: 26 U/L (ref 0–44)
AST: 37 U/L (ref 15–41)
Albumin: 4 g/dL (ref 3.5–5.0)
Alkaline Phosphatase: 33 U/L — ABNORMAL LOW (ref 38–126)
Anion gap: 12 (ref 5–15)
BUN: 15 mg/dL (ref 8–23)
CO2: 22 mmol/L (ref 22–32)
Calcium: 8.9 mg/dL (ref 8.9–10.3)
Chloride: 106 mmol/L (ref 98–111)
Creatinine, Ser: 0.76 mg/dL (ref 0.44–1.00)
GFR, Estimated: >60 mL/min (ref >60)
Glucose, Bld: 160 mg/dL — ABNORMAL HIGH (ref 70–99)
Potassium: 3.7 mmol/L (ref 3.5–5.1)
Sodium: 140 mmol/L (ref 135–145)
Total Bilirubin: 1 mg/dL (ref 0.3–1.2)
Total Protein: 6.8 g/dL (ref 6.5–8.1)

## 2023-08-31 LAB — GLUCOSE, CAPILLARY
Glucose-Capillary: 190 mg/dL — ABNORMAL HIGH (ref 70–99)
Glucose-Capillary: 198 mg/dL — ABNORMAL HIGH (ref 70–99)
Glucose-Capillary: 215 mg/dL — ABNORMAL HIGH (ref 70–99)
Glucose-Capillary: 273 mg/dL — ABNORMAL HIGH (ref 70–99)

## 2023-08-31 LAB — DIFFERENTIAL
Abs Immature Granulocytes: 0.05 10*3/uL (ref 0.00–0.07)
Basophils Absolute: 0 10*3/uL (ref 0.0–0.1)
Basophils Relative: 1 %
Eosinophils Absolute: 0.2 10*3/uL (ref 0.0–0.5)
Eosinophils Relative: 2 %
Immature Granulocytes: 1 %
Lymphocytes Relative: 27 %
Lymphs Abs: 2.3 10*3/uL (ref 0.7–4.0)
Monocytes Absolute: 0.6 10*3/uL (ref 0.1–1.0)
Monocytes Relative: 7 %
Neutro Abs: 5.1 10*3/uL (ref 1.7–7.7)
Neutrophils Relative %: 62 %

## 2023-08-31 LAB — HEMOGLOBIN A1C
Hgb A1c MFr Bld: 6.7 % — ABNORMAL HIGH (ref 4.8–5.6)
Mean Plasma Glucose: 145.59 mg/dL

## 2023-08-31 LAB — CBC
HCT: 43.7 % (ref 36.0–46.0)
Hemoglobin: 14.3 g/dL (ref 12.0–15.0)
MCH: 31.4 pg (ref 26.0–34.0)
MCHC: 32.7 g/dL (ref 30.0–36.0)
MCV: 95.8 fL (ref 80.0–100.0)
Platelets: 224 10*3/uL (ref 150–400)
RBC: 4.56 MIL/uL (ref 3.87–5.11)
RDW: 13.8 % (ref 11.5–15.5)
WBC: 8.2 10*3/uL (ref 4.0–10.5)
nRBC: 0 % (ref 0.0–0.2)

## 2023-08-31 LAB — PROTIME-INR
INR: 1 (ref 0.8–1.2)
Prothrombin Time: 13.3 s (ref 11.4–15.2)

## 2023-08-31 LAB — ETHANOL: Alcohol, Ethyl (B): <10 mg/dL (ref <10)

## 2023-08-31 LAB — APTT: aPTT: 30 s (ref 24–36)

## 2023-08-31 LAB — CBG MONITORING, ED: Glucose-Capillary: 151 mg/dL — ABNORMAL HIGH (ref 70–99)

## 2023-08-31 SURGERY — IR WITH ANESTHESIA
Anesthesia: General

## 2023-08-31 MED ORDER — ACETAMINOPHEN 325 MG PO TABS: 650.0000 mg | ORAL_TABLET | ORAL | Status: DC | PRN

## 2023-08-31 MED ORDER — TENECTEPLASE FOR STROKE
0.2500 mg/kg | PACK | Freq: Once | INTRAVENOUS | Status: AC
  Administered 2023-08-31: 22 mg via INTRAVENOUS

## 2023-08-31 MED ORDER — ACETAMINOPHEN 160 MG/5ML PO SOLN: 650.0000 mg | ORAL | Status: DC | PRN

## 2023-08-31 MED ORDER — LACTATED RINGERS IV SOLN: INTRAVENOUS | Status: DC | PRN

## 2023-08-31 MED ORDER — SENNOSIDES-DOCUSATE SODIUM 8.6-50 MG PO TABS: 1.0000 | ORAL_TABLET | Freq: Every evening | ORAL | Status: DC | PRN

## 2023-08-31 MED ORDER — ROCURONIUM BROMIDE 10 MG/ML (PF) SYRINGE
PREFILLED_SYRINGE | INTRAVENOUS | Status: DC | PRN
  Administered 2023-08-31: 30 mg via INTRAVENOUS

## 2023-08-31 MED ORDER — INSULIN ASPART 100 UNIT/ML IJ SOLN
0.0000 [IU] | INTRAMUSCULAR | Status: DC
Start: 2023-08-31 — End: 2023-09-01
  Administered 2023-08-31: 8 [IU] via SUBCUTANEOUS
  Administered 2023-08-31: 5 [IU] via SUBCUTANEOUS
  Administered 2023-08-31 – 2023-09-01 (×2): 3 [IU] via SUBCUTANEOUS

## 2023-08-31 MED ORDER — ORAL CARE MOUTH RINSE: 15.0000 mL | OROMUCOSAL | Status: DC | PRN

## 2023-08-31 MED ORDER — SUGAMMADEX SODIUM 200 MG/2ML IV SOLN
INTRAVENOUS | Status: DC | PRN
  Administered 2023-08-31: 200 mg via INTRAVENOUS

## 2023-08-31 MED ORDER — PHENYLEPHRINE HCL-NACL 20-0.9 MG/250ML-% IV SOLN
INTRAVENOUS | Status: DC | PRN
  Administered 2023-08-31: 50 ug/min via INTRAVENOUS

## 2023-08-31 MED ORDER — ACETAMINOPHEN 650 MG RE SUPP
650.0000 mg | RECTAL | Status: DC | PRN
  Filled 2023-08-31: qty 1

## 2023-08-31 MED ORDER — ACETAMINOPHEN 650 MG RE SUPP
650.0000 mg | RECTAL | Status: DC | PRN
Start: 2023-08-31 — End: 2023-08-31

## 2023-08-31 MED ORDER — STROKE: EARLY STAGES OF RECOVERY BOOK
Freq: Once | Status: AC
  Filled 2023-08-31 (×2): qty 1

## 2023-08-31 MED ORDER — VERAPAMIL HCL 2.5 MG/ML IV SOLN
INTRAVENOUS | Status: AC
  Filled 2023-08-31: qty 2

## 2023-08-31 MED ORDER — PHENYLEPHRINE 80 MCG/ML (10ML) SYRINGE FOR IV PUSH (FOR BLOOD PRESSURE SUPPORT)
PREFILLED_SYRINGE | INTRAVENOUS | Status: DC | PRN
  Administered 2023-08-31 (×2): 80 ug via INTRAVENOUS
  Administered 2023-08-31: 240 ug via INTRAVENOUS
  Administered 2023-08-31 (×2): 80 ug via INTRAVENOUS

## 2023-08-31 MED ORDER — IOHEXOL 350 MG/ML SOLN
75.0000 mL | Freq: Once | INTRAVENOUS | Status: AC | PRN
  Administered 2023-08-31: 75 mL via INTRAVENOUS

## 2023-08-31 MED ORDER — PANTOPRAZOLE SODIUM 40 MG IV SOLR
40.0000 mg | Freq: Every day | INTRAVENOUS | Status: DC
  Administered 2023-08-31: 40 mg via INTRAVENOUS
  Filled 2023-08-31: qty 10

## 2023-08-31 MED ORDER — CLEVIDIPINE BUTYRATE 0.5 MG/ML IV EMUL
INTRAVENOUS | Status: AC
  Filled 2023-08-31: qty 50

## 2023-08-31 MED ORDER — FENTANYL CITRATE (PF) 250 MCG/5ML IJ SOLN
INTRAMUSCULAR | Status: DC | PRN
  Administered 2023-08-31 (×2): 50 ug via INTRAVENOUS

## 2023-08-31 MED ORDER — PROPOFOL 10 MG/ML IV BOLUS
INTRAVENOUS | Status: DC | PRN
  Administered 2023-08-31: 100 mg via INTRAVENOUS
  Administered 2023-08-31: 80 mg via INTRAVENOUS

## 2023-08-31 MED ORDER — ACETAMINOPHEN 325 MG RE SUPP
650.0000 mg | RECTAL | Status: DC | PRN
  Administered 2023-08-31: 650 mg via RECTAL

## 2023-08-31 MED ORDER — SUCCINYLCHOLINE CHLORIDE 200 MG/10ML IV SOSY
PREFILLED_SYRINGE | INTRAVENOUS | Status: DC | PRN
  Administered 2023-08-31: 100 mg via INTRAVENOUS

## 2023-08-31 MED ORDER — DEXAMETHASONE SODIUM PHOSPHATE 10 MG/ML IJ SOLN
INTRAMUSCULAR | Status: DC | PRN
  Administered 2023-08-31: 5 mg via INTRAVENOUS

## 2023-08-31 MED ORDER — CLEVIDIPINE BUTYRATE 0.5 MG/ML IV EMUL
0.0000 mg/h | INTRAVENOUS | Status: DC
  Administered 2023-08-31: 2 mg/h via INTRAVENOUS

## 2023-08-31 MED ORDER — SODIUM CHLORIDE 0.9 % IV BOLUS: 250.0000 mL | INTRAVENOUS | Status: AC | PRN

## 2023-08-31 MED ORDER — SODIUM CHLORIDE 0.9 % IV SOLN: INTRAVENOUS | Status: AC

## 2023-08-31 MED ORDER — ORAL CARE MOUTH RINSE
15.0000 mL | OROMUCOSAL | Status: DC
  Administered 2023-08-31 – 2023-09-05 (×17): 15 mL via OROMUCOSAL

## 2023-08-31 MED ORDER — ACETAMINOPHEN 160 MG/5ML PO SOLN
650.0000 mg | ORAL | Status: DC | PRN
Start: 2023-08-31 — End: 2023-08-31

## 2023-08-31 MED ORDER — STROKE: EARLY STAGES OF RECOVERY BOOK: Freq: Once | Status: DC

## 2023-08-31 MED ORDER — EPHEDRINE SULFATE-NACL 50-0.9 MG/10ML-% IV SOSY
PREFILLED_SYRINGE | INTRAVENOUS | Status: DC | PRN
  Administered 2023-08-31: 5 mg via INTRAVENOUS

## 2023-08-31 MED ORDER — SODIUM CHLORIDE 0.9% FLUSH
3.0000 mL | Freq: Once | INTRAVENOUS | Status: AC
  Administered 2023-08-31: 3 mL via INTRAVENOUS

## 2023-08-31 MED ORDER — CHLORHEXIDINE GLUCONATE CLOTH 2 % EX PADS
6.0000 | MEDICATED_PAD | Freq: Every day | CUTANEOUS | Status: DC
  Administered 2023-08-31 – 2023-09-04 (×5): 6 via TOPICAL

## 2023-08-31 MED ORDER — LABETALOL HCL 5 MG/ML IV SOLN
20.0000 mg | Freq: Once | INTRAVENOUS | Status: AC
  Administered 2023-08-31 (×2): 10 mg via INTRAVENOUS

## 2023-08-31 MED ORDER — IOHEXOL 300 MG/ML  SOLN
150.0000 mL | Freq: Once | INTRAMUSCULAR | Status: AC | PRN
  Administered 2023-08-31: 75 mL via INTRA_ARTERIAL

## 2023-08-31 MED ORDER — LIDOCAINE 2% (20 MG/ML) 5 ML SYRINGE
INTRAMUSCULAR | Status: DC | PRN
  Administered 2023-08-31: 60 mg via INTRAVENOUS

## 2023-08-31 MED ORDER — ONDANSETRON HCL 4 MG/2ML IJ SOLN
INTRAMUSCULAR | Status: DC | PRN
  Administered 2023-08-31: 4 mg via INTRAVENOUS

## 2023-08-31 MED ORDER — NICARDIPINE HCL IN NACL 20-0.86 MG/200ML-% IV SOLN: 0.0000 mg/h | INTRAVENOUS | Status: DC

## 2023-08-31 MED ORDER — ACETAMINOPHEN 325 MG PO TABS
650.0000 mg | ORAL_TABLET | ORAL | Status: DC | PRN
  Administered 2023-08-31 – 2023-09-05 (×13): 650 mg via ORAL
  Filled 2023-08-31 (×14): qty 2

## 2023-08-31 MED ORDER — SENNOSIDES-DOCUSATE SODIUM 8.6-50 MG PO TABS
1.0000 | ORAL_TABLET | Freq: Every evening | ORAL | Status: DC | PRN
  Filled 2023-08-31: qty 1

## 2023-08-31 MED ORDER — VERAPAMIL HCL 2.5 MG/ML IV SOLN
INTRAVENOUS | Status: AC | PRN
  Administered 2023-08-31: 5 mg via INTRA_ARTERIAL

## 2023-08-31 NOTE — ED Notes (Signed)
Report given to Francesca Jewett, stroke RN, at [facility]

## 2023-08-31 NOTE — Consult Note (Signed)
NEUROLOGY CONSULTATION NOTE   Date of service: August 31, 2023 Patient Name: Molly [name] MRN:  [id number] DOB:  [birth year] Reason for consult: stroke code Requesting physician: Dr. Willy Eddy _ _ _   _ __   _ __ _ _  __ __   _ __   __ _  History of Present Illness   82 yo woman with hx HTN, HL who presents after acute onset R sided weakness and dysarthria. LKW 0830 when she was eating breakfast with her husband and developed R facial droop and difficulty feeding herself with her R hand. Her speech was also slurred. Personal review of head CT showed no acute process. NIHSS = 7. Risks, benefits, and alternatives to TNK were discussed and patient and husband gave informed consent to proceed. TNK was administered at 0958. CTA H&N was personally reviewed and discussed with both Dr. Celine Mans of neuroradiology and Dr. Salvadore Dom of neurointervention. At that time she had severe L M1 stenosis without acute occlusion and her exam was improving after TNK (NIHSS = 4) so we were in agreement that no intervention was indicated at that time. At 1100 her exam with RN was NIHSS = 4. At 1115 I was called by the ICU attending that his exam was significantly worse with mute / global aphasia and worse R sided weakness. NIHSS = 18 on my repeat exam. CT head showed no e/o hemorrhagic conversion but CTA now showed clear acute L M1 occlusion. Risks, benefits, and alternatives to IR mechanical thrombectomy were discussed with Husband Molly [name] [phone] who gave informed consent to proceed with the procedure.   ROS   UTA 2/2 aphasia  Past History   I have reviewed the following:  Past Medical History:  Diagnosis Date   Arrhythmia    Arthritis    Basal cell carcinoma    right neck, right forehead - treated in the past   Cancer (HCC)    skin   CMV (cytomegalovirus infection) (HCC)    Colon polyps    Diabetes mellitus, type 2 (HCC)    Duodenitis    Dysrhythmia    Fatty liver disease,  nonalcoholic    FH: colonic polyps    hx of-adenomatous   GERD (gastroesophageal reflux disease)    Heart murmur    dx'd in 1960s. followed by PCP   Hemorrhoid .   Hyperlipidemia    Hypertension    IBS (irritable bowel syndrome)    Osteoporosis    Osteoporosis    Pancreatitis    drug induced   Past Surgical History:  Procedure Laterality Date   ABDOMINAL HYSTERECTOMY     Carotids  3/08   mild only   CATARACT EXTRACTION W/PHACO Right 06/29/2017   Procedure: CATARACT EXTRACTION PHACO AND INTRAOCULAR LENS PLACEMENT (IOC)  Toric Diabetic Right;  Surgeon: Nevada Crane, MD;  Location: [facility];  Service: Ophthalmology;  Laterality: Right;  diabetic - oral meds   CATARACT EXTRACTION W/PHACO Right 07/27/2017   Procedure: CATARACT EXTRACTION PHACO AND INTRAOCULAR LENS PLACEMENT (IOC) LEFT DIABETIC TORIC;  Surgeon: Nevada Crane, MD;  Location: [facility];  Service: Ophthalmology;  Laterality: Right;  Diabetic - oral meds   COLONOSCOPY     COLONOSCOPY WITH PROPOFOL N/A 07/20/2019   Procedure: COLONOSCOPY WITH PROPOFOL;  Surgeon: Toledo, Boykin Nearing, MD;  Location: [facility];  Service: Gastroenterology;  Laterality: N/A;   cystocele, cystourethropexy  12/87   dexa     increase T -  3.1 spine, normal femur 1/04   ESOPHAGOGASTRODUODENOSCOPY     ESOPHAGOGASTRODUODENOSCOPY (EGD) WITH PROPOFOL N/A 04/16/2018   Procedure: ESOPHAGOGASTRODUODENOSCOPY (EGD) WITH PROPOFOL;  Surgeon: Scot Jun, MD;  Location: [facility];  Service: Endoscopy;  Laterality: N/A;   ESOPHAGOGASTRODUODENOSCOPY (EGD) WITH PROPOFOL N/A 07/20/2019   Procedure: ESOPHAGOGASTRODUODENOSCOPY (EGD) WITH PROPOFOL;  Surgeon: Toledo, Boykin Nearing, MD;  Location: [facility];  Service: Gastroenterology;  Laterality: N/A;   EYE SURGERY     hysterectomy (other)  1980   TUBAL LIGATION  1978   vaginal deliveries     x2   Family History  Problem Relation Age of Onset   Heart failure Mother     Coronary artery disease Mother    Heart attack Mother 74   Diabetes Mother    Heart failure Father    Coronary artery disease Father    Breast cancer Neg Hx    Social History   Socioeconomic History   Marital status: Married    Spouse name: Not on file   Number of children: Not on file   Years of education: Not on file   Highest education level: Not on file  Occupational History   Occupation: retired  Tobacco Use   Smoking status: Former    Current packs/day: 0.00    Types: Cigarettes    Start date: 1972    Quit date: 1992    Years since quitting: 32.8   Smokeless tobacco: Never  Vaping Use   Vaping status: Never Used  Substance and Sexual Activity   Alcohol use: Not Currently    Comment: Occ -1x/mo   Drug use: No   Sexual activity: Not on file  Other Topics Concern   Not on file  Social History Narrative   Not on file   Social Determinants of Health   Financial Resource Strain: Not on file  Food Insecurity: Not on file  Transportation Needs: Not on file  Physical Activity: Not on file  Stress: Not on file  Social Connections: Not on file   Allergies  Allergen Reactions   Alendronate Nausea Only   Codeine Nausea Only   Lansoprazole     REACTION: hurts stomach   Sulfonamide Derivatives     Childhood reaction (doesn't remember)   Adhesive [Tape] Rash    Blisters if on too long    Medications   (Not in a hospital admission)     Current Facility-Administered Medications:    [START ON 09/01/2023]  stroke: early stages of recovery book, , Does not apply, Once, Ezequiel Essex, NP   acetaminophen (TYLENOL) tablet 650 mg, 650 mg, Oral, Q4H PRN **OR** acetaminophen (TYLENOL) 160 MG/5ML solution 650 mg, 650 mg, Per Tube, Q4H PRN **OR** acetaminophen (TYLENOL) suppository 650 mg, 650 mg, Rectal, Q4H PRN, Ezequiel Essex, NP   senna-docusate (Senokot-S) tablet 1 tablet, 1 tablet, Oral, QHS PRN, Ezequiel Essex, NP  Current Outpatient Medications:    Biotin 2500  MCG CAPS, Take by mouth 2 (two) times daily., Disp: , Rfl:    CALCIUM PO, Take 750 mg by mouth 2 (two) times daily., Disp: , Rfl:    Cholecalciferol (VITAMIN D PO), Take 500 Units by mouth 2 (two) times daily., Disp: , Rfl:    Cyanocobalamin (VITAMIN B 12 PO), Take 1 tablet by mouth daily., Disp: , Rfl:    fentaNYL (DURAGESIC) 50 MCG/HR, Place onto the skin., Disp: , Rfl:    glipiZIDE (GLUCOTROL XL) 10 MG 24 hr tablet, Take 10 mg by  mouth 2 (two) times daily., Disp: , Rfl:    glucose blood test strip, 1 each by Other route as directed. Use as instructed, Disp: , Rfl:    HYDROmorphone (DILAUDID) 4 MG tablet, Take 4 mg by mouth as directed., Disp: , Rfl:    ibuprofen (ADVIL) 200 MG tablet, Take 400-600 mg by mouth every 6 (six) hours as needed., Disp: , Rfl:    insulin glargine (LANTUS SOLOSTAR) 100 UNIT/ML Solostar Pen, TAKE 48 UNITS ONCE DAILY. TAKE EACH MORNING., Disp: , Rfl:    ketoconazole (NIZORAL) 2 % cream, Apply to skin folds once daily, twice daily with flares., Disp: 60 g, Rfl: 5   Lancet Devices (ACCU-CHEK SOFTCLIX) lancets, 1 each by Other route as needed. Use as instructed, Disp: , Rfl:    lisinopril (ZESTRIL) 20 MG tablet, Take 20 mg by mouth daily., Disp: , Rfl:    magnesium oxide (MAG-OX) 400 MG tablet, Take 1 tablet by mouth daily., Disp: , Rfl:    metFORMIN (GLUCOPHAGE-XR) 500 MG 24 hr tablet, Take 1,000 mg by mouth 2 (two) times daily., Disp: , Rfl: 1   metoprolol tartrate (LOPRESSOR) 50 MG tablet, Take 50 mg by mouth 2 (two) times daily., Disp: , Rfl:    pantoprazole (PROTONIX) 40 MG tablet, Take 40 mg by mouth 2 (two) times daily., Disp: , Rfl:    pimecrolimus (ELIDEL) 1 % cream, Apply to buttocks area twice daily until improved., Disp: 30 g, Rfl: 0   potassium chloride (KLOR-CON) 20 MEQ packet, Take by mouth 2 (two) times daily., Disp: , Rfl:    promethazine (PHENERGAN) 25 MG tablet, Take 25-50 mg by mouth every 8 (eight) hours as needed., Disp: , Rfl:    simvastatin (ZOCOR)  20 MG tablet, Take 0.5 tablets (10 mg total) by mouth at bedtime. (Patient taking differently: Take 20 mg by mouth at bedtime.), Disp: 15 tablet, Rfl: 0  Vitals   Vitals:   08/31/23 1030 08/31/23 1045 08/31/23 1052 08/31/23 1100  BP: (!) 141/75 (!) 150/48 (!) 156/61 (!) 151/88  Pulse: 72 70  69  Resp: (!) 21 16  11   Temp:      TempSrc:      SpO2: 95% 96%  97%  Weight:      Height:         Body mass index is 31.55 kg/m.  Physical Exam   Initial exam significant for NIHSS = 7 at 0940 1 facial droop, 1 RUE weakness, 3 RLE weakness, 1 dysarthria, 1 language This improved to NIHSS = 4 at 1100 1 facial droop, 1 RLE weakness, 1 dysarthria, 1 language At 1115 the following worsening in exam was noted (NIHSS = 18)  Physical Exam Gen: alert, unable to answer orientation questions, NAD HEENT: Atraumatic, normocephalic;mucous membranes moist; oropharynx clear, tongue without atrophy or fasciculations. Neck: Supple, trachea midline. Resp: CTAB, no w/r/r CV: RRR, no m/g/r; nml S1 and S2. 2+ symmetric peripheral pulses. Abd: soft/NT/ND; nabs x 4 quad Extrem: Nml bulk; no cyanosis, clubbing, or edema.  Neuro: *MS: alert, unable to answer orientation questions, does not follow commands *Speech: mute global aphasia *CN: PERRL, blinks to threat bilat, optic discs unable to be visualized 2/2 pupillary constriction, EOMI, sensation intact, severe R facial droop, hearing intact to voice *Motor:  Drift to bed RUE, no movement against gravity RLE, no drift LUE, drift but not to bed LLE *Sensory: Decreased withdrawal to noxious stimuli on R *Coordination:  UTA 2/2 unable to follow commands *Reflexes:  2+  and symmetric throughout without clonus; toes down-going bilat *Gait: deferred  NIHSS  1a Level of Conscious.: 0 1b LOC Questions: 2 1c LOC Commands: 2 2 Best Gaze: 0 3 Visual: 0 4 Facial Palsy: 2 5a Motor Arm - left: 0 5b Motor Arm - Right: 2 6a Motor Leg - Left: 1 6b Motor Leg -  Right: 3 7 Limb Ataxia: 0 8 Sensory: 1 9 Best Language: 3 10 Dysarthria: 2 11 Extinct. and Inatten.: 0  TOTAL: 18  Premorbid mRS = 0   Labs   CBC:  Recent Labs  Lab 08/31/23 0930  WBC 8.2  NEUTROABS 5.1  HGB 14.3  HCT 43.7  MCV 95.8  PLT 224    Basic Metabolic Panel:  Lab Results  Component Value Date   NA 140 08/31/2023   K 3.7 08/31/2023   CO2 22 08/31/2023   GLUCOSE 160 (H) 08/31/2023   BUN 15 08/31/2023   CREATININE 0.76 08/31/2023   CALCIUM 8.9 08/31/2023   GFRNONAA >60 08/31/2023   GFRAA >60 08/11/2019   Lipid Panel:  Lab Results  Component Value Date   LDLCALC 78 01/25/2021   HgbA1c:  Lab Results  Component Value Date   HGBA1C 8.1 (H) 01/25/2021   Urine Drug Screen: No results found for: "LABOPIA", "COCAINSCRNUR", "LABBENZ", "AMPHETMU", "THCU", "LABBARB"  Alcohol Level     Component Value Date/Time   ETH <10 08/31/2023 0930    CT Head without contrast: No acute process  CTA H&N initial Severe L M1 stenosis without occlusion  CT head without contrast repeat after exam worsening No hemorrhage  CT angio Head and Neck with contrast repeat L M1 occlusion  All CNS imaging personally reviewed  Impression   82 yo woman with hx HTN, HL presented this AM as stroke code for acute onset R sided weakness and dysarthria with LKW 0830. NIHSS = 7. Head CT showed no hemorrhage. Risks, benefits, and alternatives to TNK were discussed and patient and husband gave informed consent to proceed. TNK was administered at 0958. CTA H&N was personally reviewed and discussed with both Dr. Celine Mans of neuroradiology and Dr. Salvadore Dom of neurointervention. At that time she had severe L M1 stenosis without acute occlusion and her exam was improving after TNK (NIHSS = 4) so we were in agreement that no intervention was indicated at that time.   At 1100 her exam with RN was NIHSS = 4. At 1115 I was called by the ICU attending that his exam was significantly  worse with mute / global aphasia and worse R sided weakness. NIHSS = 18 on my repeat exam. CT head showed no e/o hemorrhagic conversion but CTA now showed clear acute L M1 occlusion. Risks, benefits, and alternatives to IR mechanical thrombectomy were discussed with Husband Molly [name] [phone] who gave informed consent to proceed with the procedure.  Recommendations   - STAT transfer via [facility] to [facility] for emergent thrombectomy - Code IR activated - Patient will be admitted to neuro ICU 4N at [facility] after procedure for close monitoring and stroke workup - Case discussed with Dr. Salvadore Dom and Dr. Ritta Slot (neurointervention and neurology at [facility]) who are in agreement with plan - Patient is on post-thrombolytic monitoring protocol - Further mgmt per neuro IR and admitting neurologist at [facility]  This patient is critically ill and at significant risk of neurological worsening, death and care requires constant monitoring of vital signs, hemodynamics,respiratory and cardiac monitoring, neurological assessment, discussion with  family, other specialists and medical decision making of high complexity. I spent 95 minutes of neurocritical care time  in the care of  this patient. This was time spent independent of any time provided by nurse practitioner or PA.  Bing Neighbors, MD [facility] [phone]  If 7pm- 7am, please page neurology on call as listed in AMION.

## 2023-08-31 NOTE — ED Provider Notes (Signed)
Molly [name]    Event Date/Time   First MD Initiated Contact with Patient 08/31/23 [phone]     (approximate)   History   Code Stroke   HPI  Molly [name] is a 82 y.o. female with pmh of type 2 diabetes presents to the ER for eval of sudden onset right-sided weakness, facial droop, aphasia.  Symptoms started abruptly at 830.  EMS was called and based on her presentation prehospital code stroke was activated.  Patient was evaluated immediately by neurology at bedside and taken to CT scanner.     Physical Exam   Triage Vital Signs: ED Triage Vitals  Encounter Vitals Group     BP 08/31/23 0952 (!) 173/70     Systolic BP Percentile --      Diastolic BP Percentile --      Pulse Rate 08/31/23 1015 78     Resp 08/31/23 1015 (!) 22     Temp 08/31/23 1021 98 F (36.7 C)     Temp Source 08/31/23 1021 Oral     SpO2 08/31/23 1015 96 %     Weight 08/31/23 1022 195 lb 8 oz (88.7 kg)     Height 08/31/23 1022 5\' 6"  (1.676 m)     Head Circumference --      Peak Flow --      Pain Score 08/31/23 1022 0     Pain Loc --      Pain Education --      Exclude from Growth Chart --     Most recent vital signs: Vitals:   08/31/23 1028 08/31/23 1030  BP:  (!) 141/75  Pulse: 71 72  Resp: 17 (!) 21  Temp:    SpO2: 96% 95%     Constitutional: Alert  Eyes: Conjunctivae are normal.  Head: Atraumatic. Nose: No congestion/rhinnorhea. Mouth/Throat: Mucous membranes are moist.   Neck: Painless ROM.  Cardiovascular:   Good peripheral circulation. Respiratory: Normal respiratory effort.  No retractions.  Gastrointestinal: Soft and nontender.  Musculoskeletal:  no deformity Neurologic:  MAE spontaneously. No gross focal neurologic deficits are appreciated. Right sided weakness, facial droop and slurred speach Skin:  Skin is warm, dry and intact. No rash noted. Psychiatric: Mood and affect are normal. Speech and behavior are normal.    ED Results /  Procedures / Treatments   Labs (all labs ordered are listed, but only abnormal results are displayed) Labs Reviewed  COMPREHENSIVE METABOLIC PANEL - Abnormal; Notable for the following components:      Result Value   Glucose, Bld 160 (*)    Alkaline Phosphatase 33 (*)    All other components within normal limits  CBG MONITORING, ED - Abnormal; Notable for the following components:   Glucose-Capillary 151 (*)    All other components within normal limits  PROTIME-INR  APTT  CBC  DIFFERENTIAL  ETHANOL     EKG  ED ECG REPORT I, Willy Eddy, the attending physician, personally viewed and interpreted this ECG.   Date: 08/31/2023  EKG Time: 10:15  Rate: 80  Rhythm: sinus  Axis: normal  Intervals: normal  ST&T Change: no stemi, no depressions    RADIOLOGY Please see ED Course for my review and interpretation.  I personally reviewed all radiographic images ordered to evaluate for the above acute complaints and reviewed radiology reports and findings.  These findings were personally discussed with the patient.  Please see medical record for radiology report.    PROCEDURES:  Critical Care performed: Yes, see critical care procedure [name](s)  .Critical Care  Performed by: Willy Eddy, MD Authorized by: Willy Eddy, MD   Critical care provider statement:    Critical care time (minutes):  35   Critical care was necessary to treat or prevent imminent or life-threatening deterioration of the following conditions:  CNS failure or compromise   Critical care was time spent personally by me on the following activities:  Ordering and performing treatments and interventions, ordering and review of laboratory studies, ordering and review of radiographic studies, pulse oximetry, re-evaluation of patient's condition, review of old charts, obtaining history from patient or surrogate, examination of patient, evaluation of patient's response to treatment, discussions with  primary provider, discussions with consultants and development of treatment plan with patient or surrogate    MEDICATIONS ORDERED IN ED: Medications  sodium chloride flush (NS) 0.9 % injection 3 mL (3 mLs Intravenous Given 08/31/23 1000)  iohexol (OMNIPAQUE) 350 MG/ML injection 75 mL (75 mLs Intravenous Contrast Given 08/31/23 0951)  tenecteplase (TNKASE) injection for Stroke 22 mg (22 mg Intravenous Given 08/31/23 0958)     IMPRESSION / MDM / ASSESSMENT AND PLAN / ED COURSE  I reviewed the triage vital signs and the nursing notes.                              Differential diagnosis includes, but is not limited to, cva, tia, hypoglycemia, dehydration, electrolyte abnormality, dissection, sepsis  Patient presenting to the ER for evaluation of symptoms as described above.  Based on symptoms, risk factors and considered above differential, this presenting complaint could reflect a potentially life-threatening illness therefore the patient will be placed on continuous pulse oximetry and telemetry for monitoring.  Laboratory evaluation will be sent to evaluate for the above complaints.  Patient being evaluated immediately upon arrival by neurology.  She is protecting her airway.    Clinical Course as of 08/31/23 1041  Mon Aug 31, 2023  1010 CT head my review and interpretation without evidence of bleed.  Patient was administered TNK after discussing risk benefits with neurology at bedside. [PR]  1040 Discussed case in consultation with ICU team who kindly accept patient to their service.  Mains hemodynamically stable her symptoms are improving clinically.  She is appropriate for admission. [PR]    Clinical Course User Index [PR] Willy Eddy, MD     FINAL CLINICAL IMPRESSION(S) / ED DIAGNOSES   Final diagnoses:  Cerebrovascular accident (CVA), unspecified mechanism (HCC)     Rx / DC Orders   ED Discharge Orders     None        [name]:  This document was prepared using  Dragon voice recognition software and may include unintentional dictation errors.    Willy Eddy, MD 08/31/23 1041

## 2023-08-31 NOTE — Progress Notes (Signed)
Brief Progress Note:  Called by ED team to admit Molly [name] for post [employer]. I presented to the patient's bedside for an assessment, and noted the patient to have a facial droop with expressive and receptive aphasia. Husband at the bedside reported this being an acute change; she was able to talk just a few minutes prior to [place] coming in to the room. Dr. Selina Cooley from neurology emergently presented to the bedside and confirmed that the changes were acute. Similarly, ED physician and ED RN also confirm acute change. Patient taken emergently for repeat CT head and CTA of the head and neck. CTA showing acute occlusion prompting neuro-IR involvement. Patient will be transferred to from our ED to a higher level of care of revascularization.  Molly Chute, MD [facility] 08/31/2023 11:51 AM

## 2023-08-31 NOTE — Transfer of Care (Signed)
Immediate Anesthesia Transfer of Care Note  Patient: Molly [name]  Procedure(s) Performed: IR WITH ANESTHESIA  Patient Location: PACU  Anesthesia Type:General  Level of Consciousness: sedated  Airway & Oxygen Therapy: Patient Spontanous Breathing and Patient connected to face mask oxygen  Post-op Assessment: Report given to RN and Post -op Vital signs reviewed and stable  Post vital signs: Reviewed and stable  Last Vitals:  Vitals Value Taken Time  BP 149/68 08/31/23 1430  Temp    Pulse 77 08/31/23 1430  Resp 13 08/31/23 1430  SpO2 88 % 08/31/23 1430  Vitals shown include unfiled device data.  Last Pain:  Vitals:   08/31/23 1420  PainSc: Asleep         Complications: No notable events documented.

## 2023-08-31 NOTE — ED Notes (Signed)
Report given to [facility]  

## 2023-08-31 NOTE — Progress Notes (Signed)
CODE STROKE- PHARMACY COMMUNICATION   Time CODE STROKE called/page received: 0927  Time response to CODE STROKE was made (in person or via phone): 0935  Time Stroke Kit retrieved from [facility] (only if needed):0935  Name of Provider/Nurse contacted:Dr. Selina Cooley  Past Medical History:  Diagnosis Date   Arrhythmia    Arthritis    Basal cell carcinoma    right neck, right forehead - treated in the past   Cancer (HCC)    skin   CMV (cytomegalovirus infection) (HCC)    Colon polyps    Diabetes mellitus, type 2 (HCC)    Duodenitis    Dysrhythmia    Fatty liver disease, nonalcoholic    FH: colonic polyps    hx of-adenomatous   GERD (gastroesophageal reflux disease)    Heart murmur    dx'd in 1960s. followed by PCP   Hemorrhoid .   Hyperlipidemia    Hypertension    IBS (irritable bowel syndrome)    Osteoporosis    Osteoporosis    Pancreatitis    drug induced   Prior to Admission medications   Medication Sig Start Date End Date Taking? Authorizing Provider  Biotin 2500 MCG CAPS Take by mouth 2 (two) times daily.    [provider]  CALCIUM PO Take 750 mg by mouth 2 (two) times daily.    [provider]  Cholecalciferol (VITAMIN D PO) Take 500 Units by mouth 2 (two) times daily.    [provider]  Cyanocobalamin (VITAMIN B 12 PO) Take 1 tablet by mouth daily.    [provider]  fentaNYL (DURAGESIC) 50 MCG/HR Place onto the skin. 01/22/23   [provider]  glipiZIDE (GLUCOTROL XL) 10 MG 24 hr tablet Take 10 mg by mouth 2 (two) times daily. 07/25/19   [provider]  glucose blood test strip 1 each by Other route as directed. Use as instructed    [provider]  HYDROmorphone (DILAUDID) 4 MG tablet Take 4 mg by mouth as directed. 08/22/20   [provider]  ibuprofen (ADVIL) 200 MG tablet Take 400-600 mg by mouth every 6 (six) hours as needed.    [provider]  insulin glargine (LANTUS SOLOSTAR) 100  UNIT/ML Solostar Pen TAKE 48 UNITS ONCE DAILY. TAKE EACH MORNING. 12/30/21   [provider]  ketoconazole (NIZORAL) 2 % cream Apply to skin folds once daily, twice daily with flares. 02/04/23   Willeen Niece, MD  Lancet Devices [facility]) lancets 1 each by Other route as needed. Use as instructed    [provider]  lisinopril (ZESTRIL) 20 MG tablet Take 20 mg by mouth daily.    [provider]  magnesium oxide (MAG-OX) 400 MG tablet Take 1 tablet by mouth daily. 01/02/21   [provider]  metFORMIN (GLUCOPHAGE-XR) 500 MG 24 hr tablet Take 1,000 mg by mouth 2 (two) times daily. 02/25/18   [provider]  metoprolol tartrate (LOPRESSOR) 50 MG tablet Take 50 mg by mouth 2 (two) times daily. 07/30/19   [provider]  pantoprazole (PROTONIX) 40 MG tablet Take 40 mg by mouth 2 (two) times daily. 01/08/21   [provider]  pimecrolimus (ELIDEL) 1 % cream Apply to buttocks area twice daily until improved. 02/10/23   Willeen Niece, MD  potassium chloride (KLOR-CON) 20 MEQ packet Take by mouth 2 (two) times daily.    [provider]  promethazine (PHENERGAN) 25 MG tablet Take 25-50 mg by mouth every 8 (eight)  hours as needed. 08/22/20   [provider]  simvastatin (ZOCOR) 20 MG tablet Take 0.5 tablets (10 mg total) by mouth at bedtime. Patient taking differently: Take 20 mg by mouth at bedtime. 08/11/19 09/10/19  Ihor Austin, MD    Jaynie Bream ,PharmD Clinical Pharmacist  08/31/2023  10:05 AM

## 2023-08-31 NOTE — Progress Notes (Signed)
Code stroke cart activated at 0940-pt in CT and Dr. Selina Cooley at bedside at time of cart activation.   Ricci Barker, Multimedia programmer

## 2023-08-31 NOTE — Procedures (Signed)
INTERVENTIONAL NEURORADIOLOGY BRIEF POSTPROCEDURE NOTE  DIAGNOSTIC CEREBRAL ANGIOGRAM, MECHANICAL THROMBECTOMY, FLAT PANEL HEAD CT  Attending physician: Baldemar Lenis, MD  Diagnosis: Left M2/MCA occlusion superior div, Left M3/MCA occlusion inf div.  Access site: Right common femoral artery.  Access closure: Perclose Prostyle.  Anesthesia: IR sedation: General endotracheal anesthesia.  Medication used: refer to anesthesia documentation.  Complications: None.  Estimated blood loss:  50-80 mL mL.  Specimen: None.  Findings: Interval clot fragmentation since CTA with left M3 occlusion and multiple M3 occlusive or subocclusive filling defects. Mechanical thrombectomy performed with direct contact aspiration x4 achieving adequate recanalization (TICI 2B). Remained occluded Posterior parietal and posterior temporal cortical branches. No hemorrhage.  The patient tolerated the procedure well without incident. She was extubated and transferred to PACU in stable condition.   PLAN: - Bed rest x 6 hours post R femoral access. - SBP 120-160 mmHg.

## 2023-08-31 NOTE — Sedation Documentation (Signed)
Right femoral site and pulses confirmed with receiving nurse. Site clean dry and intact. No hematoma. Pulses confirmed; No change in pulses. All belongings with patient. Nurse denies having any other questions. No s/s of distress or discomfort observed. Airway and IV patient. vss

## 2023-08-31 NOTE — Anesthesia Preprocedure Evaluation (Addendum)
Anesthesia Evaluation  Patient identified by MRN, date of birth, ID band Patient confused    Reviewed: Allergy & Precautions, H&P , NPO status , Patient's Chart, lab work & pertinent test results, reviewed documented beta blocker date and time   Airway Mallampati: Unable to assess   Neck ROM: Full    Dental no notable dental hx. (+) Teeth Intact, Dental Advisory Given   Pulmonary former smoker   Pulmonary exam normal breath sounds clear to auscultation       Cardiovascular hypertension, Pt. on medications and Pt. on home beta blockers + dysrhythmias  Rhythm:Regular Rate:Tachycardia     Neuro/Psych CVA, Residual Symptoms negative neurological ROS  negative psych ROS   GI/Hepatic Neg liver ROS,GERD  Medicated,,  Endo/Other  diabetes, Type 2, Oral Hypoglycemic Agents    Renal/GU negative Renal ROS  negative genitourinary   Musculoskeletal  (+) Arthritis , Osteoarthritis,    Abdominal   Peds  Hematology negative hematology ROS (+)   Anesthesia Other Findings   Reproductive/Obstetrics negative OB ROS                             Anesthesia Physical Anesthesia Plan  ASA: 3 and emergent  Anesthesia Plan: General   Post-op Pain Management: Minimal or no pain anticipated   Induction: Intravenous, Rapid sequence and Cricoid pressure planned  PONV Risk Score and Plan: 3 and Ondansetron and Dexamethasone  Airway Management Planned: Oral ETT  Additional Equipment:   Intra-op Plan:   Post-operative Plan: Extubation in OR and Possible Post-op intubation/ventilation  Informed Consent: I have reviewed the patients History and Physical, chart, labs and discussed the procedure including the risks, benefits and alternatives for the proposed anesthesia with the patient or authorized representative who has indicated his/her understanding and acceptance.     Dental advisory given  Plan Discussed  with: CRNA  Anesthesia Plan Comments:        Anesthesia Quick Evaluation

## 2023-08-31 NOTE — ED Notes (Addendum)
Shoes found in room, labeled and placed in lost and found. Husband, Vann Heyde, called and informed at 1250.

## 2023-08-31 NOTE — Consult Note (Signed)
Neurology Consultation  Reason for Consult: Code Stroke transfer from [facility] for neuro IR Referring Physician: Dr. Selina Cooley  CC: Patient is unable to provide chief complaint due to patient condition.  Patient is aphasic.  History is obtained from: Chart review, [facility], unable to obtain from patient due to aphasia, no family present on patient arrival to [facility], unable to reach emergency contact via telephone  HPI: Molly [name] is a 82 y.o. female with a medical history significant for type 2 diabetes mellitus, nonalcoholic fatty liver disease, GERD, essential hypertension, hyperlipidemia, and nonspecific cardiac arrhythmia who initially presented to [facility] ED this morning for evaluation of acute onset of right-sided weakness while eating breakfast.  Patient was eating breakfast in her usual state of health when at 08:30, she was having trouble feeding herself with her right hand and patient's husband noted that Molly [name] had right facial weakness and slurred speech.  On initial evaluation at [facility] ED, patient was given TNK for her symptoms on presentation with imaging concerning for severe left M1 stenosis without acute occlusion.  Following TNKase, patient had some improvement in her exam NIHSS 8 initially improved to a 4 following TNKase administration.  At 11:15, patient had an acute worsening of exam with acute onset of expressive and receptive aphasia and worsening right-sided weakness.  Repeat imaging was obtained revealing an acute M1 MCA occlusion and transport was activated for [facility] interventional radiology.  [facility] reports that approximately 15 minutes prior to [facility] arrival, the patient had improving right-sided weakness and she was speaking with transport staff with delayed responses but no aphasia.  On arrival, patient was aphasic with right hemiparesis, left gaze, right homonymous hemianopsia, and right-sided facial droop and patient was taken directly to the IR suite for  intervention.   LKW: 08:30 AM  TNK given?: Yes, TNKase was administered at [facility] at 09:58 IR Thrombectomy? Yes, patient exam worsening after TNKase administration prompted further neuroimaging at [facility] and repeat CTA head and neck imaging revealed distal left M1 segment/proximal superior and inferior M2 segment occlusion.  Discussion of risks, benefits, and alternatives were completed by Dr. Selina Cooley and consent was signed by patient's husband prior to transfer.  Modified Rankin Scale: 0-Completely asymptomatic and back to baseline post- stroke  ROS: Unable to obtain due to patient's condition, acutely aphasic on arrival.   Past Medical History:  Diagnosis Date   Arrhythmia    Arthritis    Basal cell carcinoma    right neck, right forehead - treated in the past   Cancer (HCC)    skin   CMV (cytomegalovirus infection) (HCC)    Colon polyps    Diabetes mellitus, type 2 (HCC)    Duodenitis    Dysrhythmia    Fatty liver disease, nonalcoholic    FH: colonic polyps    hx of-adenomatous   GERD (gastroesophageal reflux disease)    Heart murmur    dx'd in 1960s. followed by PCP   Hemorrhoid .   Hyperlipidemia    Hypertension    IBS (irritable bowel syndrome)    Osteoporosis    Osteoporosis    Pancreatitis    drug induced   Past Surgical History:  Procedure Laterality Date   ABDOMINAL HYSTERECTOMY     Carotids  3/08   mild only   CATARACT EXTRACTION W/PHACO Right 06/29/2017   Procedure: CATARACT EXTRACTION PHACO AND INTRAOCULAR LENS PLACEMENT (IOC)  Toric Diabetic Right;  Surgeon: Nevada Crane, MD;  Location: [facility];  Service: Ophthalmology;  Laterality: Right;  diabetic - oral meds   CATARACT EXTRACTION W/PHACO Right 07/27/2017   Procedure: CATARACT EXTRACTION PHACO AND INTRAOCULAR LENS PLACEMENT (IOC) LEFT DIABETIC TORIC;  Surgeon: Nevada Crane, MD;  Location: [facility];  Service: Ophthalmology;  Laterality: Right;  Diabetic - oral meds   COLONOSCOPY      COLONOSCOPY WITH PROPOFOL N/A 07/20/2019   Procedure: COLONOSCOPY WITH PROPOFOL;  Surgeon: Toledo, Boykin Nearing, MD;  Location: [facility] ENDOSCOPY;  Service: Gastroenterology;  Laterality: N/A;   cystocele, cystourethropexy  12/87   dexa     increase T - 3.1 spine, normal femur 1/04   ESOPHAGOGASTRODUODENOSCOPY     ESOPHAGOGASTRODUODENOSCOPY (EGD) WITH PROPOFOL N/A 04/16/2018   Procedure: ESOPHAGOGASTRODUODENOSCOPY (EGD) WITH PROPOFOL;  Surgeon: Scot Jun, MD;  Location: [facility] ENDOSCOPY;  Service: Endoscopy;  Laterality: N/A;   ESOPHAGOGASTRODUODENOSCOPY (EGD) WITH PROPOFOL N/A 07/20/2019   Procedure: ESOPHAGOGASTRODUODENOSCOPY (EGD) WITH PROPOFOL;  Surgeon: Toledo, Boykin Nearing, MD;  Location: [facility] ENDOSCOPY;  Service: Gastroenterology;  Laterality: N/A;   EYE SURGERY     hysterectomy (other)  1980   TUBAL LIGATION  1978   vaginal deliveries     x2   Family History  Problem Relation Age of Onset   Heart failure Mother    Coronary artery disease Mother    Heart attack Mother 37   Diabetes Mother    Heart failure Father    Coronary artery disease Father    Breast cancer Neg Hx    Social History:   reports that she quit smoking about 32 years ago. Her smoking use included cigarettes. She started smoking about 52 years ago. She has never used smokeless tobacco. She reports that she does not currently use alcohol. She reports that she does not use drugs.  Medications  Current Facility-Administered Medications:    [START ON 09/01/2023]  stroke: early stages of recovery book, , Does not apply, Once, Toberman, Stevi W, NP   0.9 %  sodium chloride infusion, , Intravenous, Continuous, Toberman, Stevi W, NP   acetaminophen (TYLENOL) tablet 650 mg, 650 mg, Oral, Q4H PRN **OR** acetaminophen (TYLENOL) 160 MG/5ML solution 650 mg, 650 mg, Per Tube, Q4H PRN **OR** acetaminophen (TYLENOL) suppository 650 mg, 650 mg, Rectal, Q4H PRN, Kara Mead, NP   labetalol (NORMODYNE) injection 20 mg, 20 mg,  Intravenous, Once **AND** nicardipine (CARDENE) 20mg  in 0.86% saline IV infusion (0.1 mg/ml), 0-15 mg/hr, Intravenous, Continuous, Toberman, Stevi W, NP   pantoprazole (PROTONIX) injection 40 mg, 40 mg, Intravenous, QHS, Toberman, Stevi W, NP   senna-docusate (Senokot-S) tablet 1 tablet, 1 tablet, Oral, QHS PRN, Kara Mead, NP  Exam: Current vital signs: There were no vitals taken for this visit. Vital signs in last 24 hours: Temp:  [98 F (36.7 C)] 98 F (36.7 C) (10/21 1215) Pulse Rate:  [69-78] 73 (10/21 1215) Resp:  [11-24] 20 (10/21 1215) BP: (141-173)/(48-91) 161/58 (10/21 1215) SpO2:  [95 %-97 %] 96 % (10/21 1215) Weight:  [88.7 kg] 88.7 kg (10/21 1022)  GENERAL: Awake, alert, acutely ill appearing Caucasian female  Psych: Patient is calm during exam, she does not cooperate secondary to aphasia  Head: Normocephalic and atraumatic, without obvious abnormality EENT: Normal conjunctivae, dry mucous membranes, no OP obstruction LUNGS: Normal respiratory effort. Non-labored breathing on room air CV: Regular rate and rhythm on telemetry ABDOMEN: Soft, non-tender, non-distended Extremities: Warm, well perfused, without obvious deformity  NEURO:  Mental Status: Patient is awake and alert on arrival.  She is globally aphasic. She does not answer orientation questions or follow commands.  She does not attempt to verbalize throughout assessment.  Cranial Nerves:  II: PERRL.  III, IV, VI: Left gaze preference noted, she will cross midline fully rightward briefly but has a clear left gaze preference.  V: Does not blink to threat on the right, blinks to threat on the left consistently.  VII: Face with r facial weakness VIII: Unable to assess patient's hearing  IX, X: Does not allow for visualization.  Does not phonate. XI: Head is turned leftward though will occasionally turn rightward throughout assessment  XII: Does not protrude tongue to command Motor: Flaccid right  upper and lower extremity.  Patient will move the left upper and lower extremities spontaneously and antigravity.  Patient does not participate in confrontational strength testing. Tone is flaccid on the right. Bulk is normal.  Sensation: Grimaces to touch/passive extremity elevation throughout  Coordination: Does not perform Gait: Deferred in the setting for patient's safety  NIHSS: 1a Level of Conscious.: 0 1b LOC Questions: 2 1c LOC Commands: 2 2 Best Gaze: 1 3 Visual: 2 4 Facial Palsy: 2 5a Motor Arm - left: 1 5b Motor Arm - Right: 4 6a Motor Leg - Left: 1 6b Motor Leg - Right: 4 7 Limb Ataxia: 0 8 Sensory: 0 9 Best Language: 3 10 Dysarthria: 2 11 Extinct. and Inatten.: 0 TOTAL: 24  Labs I have reviewed labs in epic and the results pertinent to this consultation are: CBC    Component Value Date/Time   WBC 8.2 08/31/2023 0930   RBC 4.56 08/31/2023 0930   HGB 14.3 08/31/2023 0930   HCT 43.7 08/31/2023 0930   PLT 224 08/31/2023 0930   MCV 95.8 08/31/2023 0930   MCH 31.4 08/31/2023 0930   MCHC 32.7 08/31/2023 0930   RDW 13.8 08/31/2023 0930   LYMPHSABS 2.3 08/31/2023 0930   MONOABS 0.6 08/31/2023 0930   EOSABS 0.2 08/31/2023 0930   BASOSABS 0.0 08/31/2023 0930   CMP     Component Value Date/Time   NA 140 08/31/2023 0930   NA 143 02/10/2023 1503   K 3.7 08/31/2023 0930   CL 106 08/31/2023 0930   CO2 22 08/31/2023 0930   GLUCOSE 160 (H) 08/31/2023 0930   BUN 15 08/31/2023 0930   BUN 20 02/10/2023 1503   CREATININE 0.76 08/31/2023 0930   CALCIUM 8.9 08/31/2023 0930   PROT 6.8 08/31/2023 0930   PROT 7.2 02/10/2023 1503   ALBUMIN 4.0 08/31/2023 0930   ALBUMIN 4.9 (H) 02/10/2023 1503   AST 37 08/31/2023 0930   ALT 26 08/31/2023 0930   ALKPHOS 33 (L) 08/31/2023 0930   BILITOT 1.0 08/31/2023 0930   BILITOT 0.5 02/10/2023 1503   GFRNONAA >60 08/31/2023 0930   GFRAA >60 08/11/2019 0326   Lipid Panel     Component Value Date/Time   CHOL 148 01/25/2021 0500    TRIG 70 01/25/2021 0500   HDL 56 01/25/2021 0500   CHOLHDL 2.6 01/25/2021 0500   VLDL 14 01/25/2021 0500   LDLCALC 78 01/25/2021 0500   Lab Results  Component Value Date   HGBA1C 8.1 (H) 01/25/2021   Drugs of Abuse  No results found for: "LABOPIA", "COCAINSCRNUR", "LABBENZ", "AMPHETMU", "THCU", "LABBARB"   Imaging I have reviewed the images obtained:  CT head (initial 9:35): No hemorrhage or CT evidence of acute cortical infarct.  Chronic left frontal lobe infarct.  Dense calcification in the proximal left M1.  No  disproportionately hyperdense vessel disease otherwise.  CT angio head and neck (9:50): Severe nearly occlusive stenosis of the proximal left M1 segment in the region of the dense calcification seen on same-day CT head.  There is flow distal to the site of stenosis.  While this could represent chronic severe stenosis secondary to calcified atherosclerotic plaque, a component of superimposed acute on chronic thrombus is not entirely excluded.  The left vertebral artery terminates in a PICA. The left PICA appears severely stenosed to nearly occluded proximally.  No hemodynamically significant stenosis in the neck.  Repeat CTA head and neck (11:20): The distal left M1 segment/proximal superior and inferior M2 segments are now occluded for short segment with diminished collateral flow throughout the distal MCA territory.  No evidence of embolic infarct in the left MCA distribution or intracranial hemorrhage.  Assessment: 82 year old female with PMHx of type 2 diabetes mellitus, nonalcoholic fatty liver disease, GERD, HTN, HLD, and nonspecific cardiac arrhythmia who initially presented to [facility] ED for evaluation of acute onset of right-sided weakness, slurred speech, right facial droop following eating breakfast this morning at 08:30.  Patient was given TNKase with initial improvement in her exam followed by acute worsening of her neurologic status prompting further neuroimaging.   Repeat imaging findings obtained were concerning for acute distal left M1 segment/proximal superior and inferior M2 segment occlusions for a short-segment with diminished collateral flow throughout the distal MCA territory send patient was transferred to [facility] for IR thrombectomy.  Plan: Acute Ischemic Stroke Cerebral infarction due to occlusion versus severe stenosis of left middle cerebral artery   Acuity: Acute Current Suspected Etiology: Occlusion versus stenosis  Continue Evaluation:  -Admit to: ICU -Hold Aspirin until 24 hour post tPA neuroimaging is stable and without evidence of bleeding -Blood pressure control, SBP goal 120-160 per IR -MRI/ECHO/A1C/Lipid panel. -Hyperglycemia management per SSI to maintain glucose 140-180mg /dL. -PT/OT/ST therapies and recommendations when able  CNS Acute ischemic stroke -Close neuro monitoring -STAT CTH with neurologic decline  Dysarthria Dysphagia following cerebral infarction  -NPO until cleared by speech -ST -Advance diet as tolerated  Hemiplegia and hemiparesis following cerebral infarction affecting right dominant side -PT/OT  RESP Intubated for IR procedure -vent management per ICU if patient remains intubated post-procedure -Extubate when able -supplemental oxygen for SpO2 > 92%  CV Essential (primary) hypertension Hypertensive Emergency -Aggressive BP control, goal SBP 120-160 per IR -Labetalol PRN followed by Cardene gtt -Home meds held on admission: lisinopril, Lopressor   Hyperlipidemia, unspecified  - Statin for goal LDL < 70  HEME AM CBC  -Monitor H&H -Transfuse for hgb < 7 -Strict bedrest for 24 hours post-TNK  ENDO Type 2 diabetes mellitus with hyperglycemia  -SSI -goal HgbA1c < 7% -Glycemic protocol initiated  -Home meds held on admission: metformin, glipizide, Lantus -Consider inpatient diabetes coordinator assistance with glucose management as needed  GI/GU -Gentle hydration while NPO,  transition to PO when able -Bedside swallow prior to PO  Fluid/Electrolyte Disorders AM CMP -Replete as needed -Repeat labs as needed -Trend  ID Trend fever and WBC -UA pending  -AM CBC  Nutrition E66.9 Obesity   Prophylaxis DVT:  SCDs GI: PPI Bowel: Docusate / Senna  Diet: NPO until cleared by speech  Code Status: Full Code    THE FOLLOWING WERE PRESENT ON ADMISSION: CNS -  Acute Ischemic Stroke, Hemiparesis Cardiovascular - Cardiac Arrhythmia, unspecified Heme-  Coagulopathy secondary to TNK at outside hospital  [facility], AGAC-NP [facility] Pager: [phone]  I was present  for the entirety of the evaluation and management reflected in the above note and plan and assessment were jointly formulated.  She is going for emergent thrombectomy and will need to be closely monitored in the ICU following this.  Of note, though narcotics are listed on her home medications, she has not taken these in some time.  This patient is critically ill and at significant risk of neurological worsening, death and care requires constant monitoring of vital signs, hemodynamics,respiratory and cardiac monitoring, neurological assessment, discussion with family, other specialists and medical decision making of high complexity. I spent 35 minutes of neurocritical care time  in the care of  this patient. This was time spent independent of any time provided by nurse practitioner or PA.  Ritta Slot, MD [facility] [phone]  If 7pm- 7am, please page neurology on call as listed in AMION. 08/31/2023  4:58 PM

## 2023-08-31 NOTE — Code Documentation (Signed)
Molly [name] is an 82 yr old female arriving to [facility] from [facility] via [place] as a code IR.   Per report from [facility], [name] was LKW this morning at 0830. After this time, she had a witnessed onset of Right sided weakness. She went to [facility], initial NIHSS 7. TNK was given at 12:00. Pt got worse (NIHSS 15). Repeat imaging showed no bleed but positive for Lt M1 occlusion. Code IR activated at 1150.     Pt arrived to [facility] 12:44. NIHSS 24. Pt globally aphasic, not moving rt side at all, left gaze. (Please see documentation for NIHSS details and times). Covid swab obtained. Patient taken to IR suite at 1245. Report given to [employer]. 4 [facility] ICU notified.

## 2023-08-31 NOTE — ED Triage Notes (Signed)
Pt to ED via [facility] from home for c/o right-sided weakness, facial droop, and slurred speech. Last known well 830.

## 2023-08-31 NOTE — Anesthesia Postprocedure Evaluation (Signed)
Anesthesia Post Note  Patient: Molly [name]  Procedure(s) Performed: IR WITH ANESTHESIA     Patient location during evaluation: PACU Anesthesia Type: General Level of consciousness: awake and confused Pain management: pain level controlled Vital Signs Assessment: post-procedure vital signs reviewed and stable Respiratory status: spontaneous breathing, nonlabored ventilation, respiratory function stable and patient connected to nasal cannula oxygen Cardiovascular status: blood pressure returned to baseline and stable Postop Assessment: no apparent nausea or vomiting Anesthetic complications: no  No notable events documented.  Last Vitals:  Vitals:   08/31/23 1845 08/31/23 2000  BP: (!) 144/61   Pulse: 74   Resp: 16   Temp:  36.8 C  SpO2: 97%     Last Pain:  Vitals:   08/31/23 2000  TempSrc: Axillary  PainSc:                  Parilee Hally,W. EDMOND

## 2023-08-31 NOTE — Anesthesia Procedure Notes (Signed)
Procedure Name: Intubation Date/Time: 08/31/2023 12:55 PM  Performed by: Debbe Odea, CRNAPre-anesthesia Checklist: Patient identified, Emergency Drugs available, Suction available and Patient being monitored Patient Re-evaluated:Patient Re-evaluated prior to induction Oxygen Delivery Method: Circle System Utilized Preoxygenation: Pre-oxygenation with 100% oxygen Induction Type: IV induction Laryngoscope Size: Miller and 2 Grade View: Grade I Tube type: Oral Tube size: 7.0 mm Number of attempts: 1 Airway Equipment and Method: Stylet Placement Confirmation: ETT inserted through vocal cords under direct vision, positive ETCO2 and breath sounds checked- equal and bilateral Secured at: 22 cm Tube secured with: Tape Dental Injury: Teeth and Oropharynx as per pre-operative assessment

## 2023-08-31 NOTE — ED Notes (Signed)
This RN attempted to call report to [facility] IR, did not get an answer.

## 2023-09-01 ENCOUNTER — Inpatient Hospital Stay (HOSPITAL_COMMUNITY): Payer: PPO

## 2023-09-01 ENCOUNTER — Encounter (HOSPITAL_COMMUNITY): Payer: Self-pay | Admitting: Radiology

## 2023-09-01 DIAGNOSIS — I6389 Other cerebral infarction: Secondary | ICD-10-CM

## 2023-09-01 DIAGNOSIS — I639 Cerebral infarction, unspecified: Secondary | ICD-10-CM | POA: Diagnosis not present

## 2023-09-01 LAB — LIPID PANEL
Cholesterol: 133 mg/dL (ref 0–200)
HDL: 52 mg/dL (ref >40)
LDL Cholesterol: 63 mg/dL (ref 0–99)
Total CHOL/HDL Ratio: 2.6 {ratio}
Triglycerides: 89 mg/dL (ref <150)
VLDL: 18 mg/dL (ref 0–40)

## 2023-09-01 LAB — COMPREHENSIVE METABOLIC PANEL
ALT: 23 U/L (ref 0–44)
AST: 23 U/L (ref 15–41)
Albumin: 3.1 g/dL — ABNORMAL LOW (ref 3.5–5.0)
Alkaline Phosphatase: 25 U/L — ABNORMAL LOW (ref 38–126)
Anion gap: 11 (ref 5–15)
BUN: 11 mg/dL (ref 8–23)
CO2: 24 mmol/L (ref 22–32)
Calcium: 8.3 mg/dL — ABNORMAL LOW (ref 8.9–10.3)
Chloride: 105 mmol/L (ref 98–111)
Creatinine, Ser: 0.72 mg/dL (ref 0.44–1.00)
GFR, Estimated: >60 mL/min (ref >60)
Glucose, Bld: 131 mg/dL — ABNORMAL HIGH (ref 70–99)
Potassium: 3.7 mmol/L (ref 3.5–5.1)
Sodium: 140 mmol/L (ref 135–145)
Total Bilirubin: 0.6 mg/dL (ref 0.3–1.2)
Total Protein: 5.5 g/dL — ABNORMAL LOW (ref 6.5–8.1)

## 2023-09-01 LAB — CBC
HCT: 35.4 % — ABNORMAL LOW (ref 36.0–46.0)
Hemoglobin: 11.9 g/dL — ABNORMAL LOW (ref 12.0–15.0)
MCH: 31.6 pg (ref 26.0–34.0)
MCHC: 33.6 g/dL (ref 30.0–36.0)
MCV: 93.9 fL (ref 80.0–100.0)
Platelets: 222 10*3/uL (ref 150–400)
RBC: 3.77 MIL/uL — ABNORMAL LOW (ref 3.87–5.11)
RDW: 13.9 % (ref 11.5–15.5)
WBC: 14.2 10*3/uL — ABNORMAL HIGH (ref 4.0–10.5)
nRBC: 0 % (ref 0.0–0.2)

## 2023-09-01 LAB — ECHOCARDIOGRAM COMPLETE BUBBLE STUDY
AR max vel: 2.36 cm2
AV Area VTI: 2.32 cm2
AV Area mean vel: 2.29 cm2
AV Mean grad: 4 mm[Hg]
AV Peak grad: 6.9 mm[Hg]
AV Vena cont: 0.4 cm
Ao pk vel: 1.31 m/s
Area-P 1/2: 5.54 cm2
P 1/2 time: 434 ms
S' Lateral: 2.5 cm

## 2023-09-01 LAB — GLUCOSE, CAPILLARY
Glucose-Capillary: 153 mg/dL — ABNORMAL HIGH (ref 70–99)
Glucose-Capillary: 121 mg/dL — ABNORMAL HIGH (ref 70–99)
Glucose-Capillary: 185 mg/dL — ABNORMAL HIGH (ref 70–99)

## 2023-09-01 MED ORDER — SIMVASTATIN 20 MG PO TABS
20.0000 mg | ORAL_TABLET | Freq: Every day | ORAL | Status: DC
  Administered 2023-09-01 – 2023-09-04 (×4): 20 mg via ORAL
  Filled 2023-09-01 (×4): qty 1

## 2023-09-01 MED ORDER — INSULIN ASPART 100 UNIT/ML IJ SOLN
0.0000 [IU] | Freq: Three times a day (TID) | INTRAMUSCULAR | Status: DC
  Administered 2023-09-01: 4 [IU] via SUBCUTANEOUS
  Administered 2023-09-01: 3 [IU] via SUBCUTANEOUS
  Administered 2023-09-02: 4 [IU] via SUBCUTANEOUS
  Administered 2023-09-02: 7 [IU] via SUBCUTANEOUS
  Administered 2023-09-02 – 2023-09-03 (×2): 4 [IU] via SUBCUTANEOUS
  Administered 2023-09-03: 7 [IU] via SUBCUTANEOUS
  Administered 2023-09-03 – 2023-09-04 (×2): 11 [IU] via SUBCUTANEOUS
  Administered 2023-09-04 – 2023-09-05 (×5): 7 [IU] via SUBCUTANEOUS
  Administered 2023-09-06: 3 [IU] via SUBCUTANEOUS
  Administered 2023-09-06: 4 [IU] via SUBCUTANEOUS

## 2023-09-01 MED ORDER — SODIUM CHLORIDE 0.9 % IV BOLUS
500.0000 mL | Freq: Once | INTRAVENOUS | Status: AC
  Administered 2023-09-01: 500 mL via INTRAVENOUS

## 2023-09-01 MED ORDER — LORAZEPAM 2 MG/ML IJ SOLN
INTRAMUSCULAR | Status: AC
  Administered 2023-09-01: 1 mg
  Filled 2023-09-01: qty 1

## 2023-09-01 MED ORDER — PANTOPRAZOLE SODIUM 40 MG PO TBEC
40.0000 mg | DELAYED_RELEASE_TABLET | Freq: Every day | ORAL | Status: DC
Start: 2023-09-01 — End: 2023-09-06
  Administered 2023-09-01 – 2023-09-05 (×5): 40 mg via ORAL
  Filled 2023-09-01 (×5): qty 1

## 2023-09-01 MED ORDER — IOHEXOL 350 MG/ML SOLN
75.0000 mL | Freq: Once | INTRAVENOUS | Status: AC | PRN
  Administered 2023-09-01: 75 mL via INTRAVENOUS

## 2023-09-01 MED ORDER — INSULIN ASPART 100 UNIT/ML IJ SOLN
0.0000 [IU] | Freq: Every day | INTRAMUSCULAR | Status: DC
  Administered 2023-09-01: 3 [IU] via SUBCUTANEOUS
  Administered 2023-09-03: 2 [IU] via SUBCUTANEOUS
  Administered 2023-09-04: 3 [IU] via SUBCUTANEOUS

## 2023-09-01 NOTE — Evaluation (Signed)
Occupational Therapy Evaluation Patient Details Name: Molly [name] MRN: [id number] DOB: [birth year] Today's Date: 09/01/2023   History of Present Illness 82 yo female presenting with R side weakness and aphasia. Imaging + for L M1 MCA occlusion s/p TNK at [facility] with IR thrombectomy successful. PMH DM2, nonalcoholic fatty liver disease, GERD HTN HLD nonspecific cardiac arrhythmia L frontal lobe infarct.   Clinical Impression   PT admitted with CVA. Pt currently with functional limitiations due to the deficits listed below (see OT problem list). Pt currently with R UE weakness and R inattention.  Pt will benefit from skilled OT to increase their independence and safety with adls and balance to allow discharge Patient will benefit from intensive inpatient follow up therapy, >3 hours/day .        If plan is discharge home, recommend the following: Two people to help with walking and/or transfers;Two people to help with bathing/dressing/bathroom    Functional Status Assessment  Patient has had a recent decline in their functional status and demonstrates the ability to make significant improvements in function in a reasonable and predictable amount of time.  Equipment Recommendations  BSC/3in1;Wheelchair (measurements OT);Wheelchair cushion (measurements OT);Hospital bed    Recommendations for Other Services Rehab consult     Precautions / Restrictions Precautions Precautions: Fall Precaution Comments: SBP 120-160 Restrictions Weight Bearing Restrictions: No      Mobility Bed Mobility Overal bed mobility: Needs Assistance Bed Mobility: Supine to Sit, Sit to Supine, Rolling Rolling: Mod assist, Used rails   Supine to sit: Max assist, Used rails, HOB elevated Sit to supine: Max assist, +2 for safety/equipment   General bed mobility comments: Mod-max assist for roll Rt/Lt with hand over hand placement to initiate reach to bed rail and manual facilitation of flexing knees to roll. Max  assist to bring LE's off EOB and raise trunk upright, pt initially pulling on bed rail and preventing trunk rise, cues to release rail needed. Max+2 for safe lower/return to supine.    Transfers                   General transfer comment: deferred due to stat CT      Balance Overall balance assessment: Needs assistance Sitting-balance support: Feet supported, Bilateral upper extremity supported Sitting balance-Leahy Scale: Fair                                     ADL either performed or assessed with clinical judgement   ADL Overall ADL's : Needs assistance/impaired Eating/Feeding: Maximal assistance   Grooming: Modified independent   Upper Body Bathing: Maximal assistance   Lower Body Bathing: Total assistance   Upper Body Dressing : Maximal assistance   Lower Body Dressing: Total assistance                       Vision Ability to See in Adequate Light: 1 Impaired Patient Visual Report: Diplopia Vision Assessment?: Vision impaired- to be further tested in functional context Additional Comments: pt denies diiplopia but going for stat CT due to prior diplopia report     Perception         Praxis         Pertinent Vitals/Pain Pain Assessment Pain Assessment: No/denies pain     Extremity/Trunk Assessment Upper Extremity Assessment Upper Extremity Assessment: Right hand dominant;RUE deficits/detail RUE Deficits / Details: MMT 4 out 5 decreased fine motor  RUE Sensation: decreased light touch RUE Coordination: decreased fine motor;decreased gross motor   Lower Extremity Assessment Lower Extremity Assessment: Defer to PT evaluation RLE Deficits / Details: some tone noted in Rt LE however limited time to test due to Rn arriving for stat CT RLE Sensation: decreased light touch (inconsistent) RLE Coordination: decreased gross motor   Cervical / Trunk Assessment Cervical / Trunk Assessment: Kyphotic   Communication  Communication Communication: Difficulty following commands/understanding Following commands: Follows one step commands inconsistently;Follows one step commands with increased time Cueing Techniques: Verbal cues;Tactile cues;Visual cues   Cognition Arousal: Alert Behavior During Therapy: WFL for tasks assessed/performed Overall Cognitive Status: Impaired/Different from baseline Area of Impairment: Memory, Following commands, Problem solving                     Memory: Decreased short-term memory Following Commands: Follows one step commands inconsistently, Follows one step commands with increased time     Problem Solving: Slow processing, Decreased initiation, Requires verbal cues, Difficulty sequencing, Requires tactile cues       General Comments  VSS    Exercises     Shoulder Instructions      Home Living Family/patient expects to be discharged to:: Private residence Living Arrangements: Spouse/significant other Available Help at Discharge: Family;Available 24 hours/day Type of Home: House Home Access: Stairs to enter [employer] of Steps: 3 Entrance Stairs-Rails: Right;Left Home Layout: One level     Bathroom Shower/Tub: Producer, television/film/video: Standard Bathroom Accessibility: Yes   Home Equipment: Shower seat;Grab bars - toilet;Grab bars - tub/shower;Hand held Programmer, systems (2 wheels);Cane - single point          Prior Functioning/Environment Prior Level of Function : Independent/Modified Independent;Driving             Mobility Comments: 1 falls on vacation ~2 months ago ADLs Comments: driving when she wanted to, spouse food shops,        OT Problem List: Decreased strength;Decreased range of motion;Decreased activity tolerance;Impaired balance (sitting and/or standing);Impaired vision/perception;Decreased cognition;Decreased safety awareness;Decreased knowledge of use of DME or AE;Decreased knowledge of  precautions;Impaired sensation;Obesity      OT Treatment/Interventions: Self-care/ADL training;Therapeutic exercise;Neuromuscular education;Energy conservation;Manual therapy;DME and/or AE instruction;Modalities;Therapeutic activities;Cognitive remediation/compensation;Patient/family education;Balance training    OT Goals(Current goals can be found in the care plan section) Acute Rehab OT Goals Patient Stated Goal: none stated OT Goal Formulation: With patient/family Time For Goal Achievement: 09/15/23 Potential to Achieve Goals: Good  OT Frequency: Min 1X/week    Co-evaluation PT/OT/SLP Co-Evaluation/Treatment: Yes Reason for Co-Treatment: Complexity of the patient's impairments (multi-system involvement);Necessary to address cognition/behavior during functional activity;For patient/therapist safety;To address functional/ADL transfers   OT goals addressed during session: ADL's and self-care;Proper use of Adaptive equipment and DME;Strengthening/ROM      AM-PAC OT "6 Clicks" Daily Activity     Outcome Measure Help from another person eating meals?: A Little Help from another person taking care of personal grooming?: A Little Help from another person toileting, which includes using toliet, bedpan, or urinal?: A Lot Help from another person bathing (including washing, rinsing, drying)?: A Lot Help from another person to put on and taking off regular upper body clothing?: A Little Help from another person to put on and taking off regular lower body clothing?: A Lot 6 Click Score: 15   End of Session Nurse Communication: Mobility status;Precautions  Activity Tolerance: Patient tolerated treatment well Patient left: in bed;with call bell/phone within reach;with nursing/sitter in room  OT Visit Diagnosis: Unsteadiness on feet (R26.81);Muscle weakness (generalized) (M62.81)                Time: 1610-9604 OT Time Calculation (min): 21 min Charges:  OT General Charges $OT Visit: 1  Visit OT Evaluation $OT Eval Moderate Complexity: 1 Mod   Brynn, OTR/L  Acute Rehabilitation Services Office: [phone] .   Mateo Flow 09/01/2023, 5:28 PM

## 2023-09-01 NOTE — Progress Notes (Signed)
  Inpatient Rehab Admissions Coordinator :  Per therapy recommendations patient was screened for CIR candidacy by Ottie Glazier RN MSN. Patient is not yet at a level to tolerate the intensity required to pursue a CIR admit . Limited evaluations today. Patient may have the potential to progress to become a candidate. The CIR admissions team will follow and monitor for progress and place a Rehab Consult order if felt to be appropriate. Please contact me with any questions.  Ottie Glazier RN MSN Admissions Coordinator [phone]

## 2023-09-01 NOTE — Progress Notes (Signed)
Referring Physician(s): Code Stroke  Supervising Physician: Baldemar Lenis  Patient Status:  [facility] - In-pt  Chief Complaint:  Left MCA occlusion s/p mechanical thrombectomy 10/21  Subjective:  Patient alert and accompanied by family at bedside. Patient seems to intermittent expressive aphasia as well as intermittent difficulty following commands.   Allergies: Alendronate, Codeine, Lansoprazole, Sulfonamide derivatives, and Adhesive [tape]  Medications: Prior to Admission medications   Medication Sig Start Date End Date Taking? Authorizing Provider  Biotin 2500 MCG CAPS Take by mouth 2 (two) times daily.   Yes [provider]  CALCIUM PO Take 750 mg by mouth 2 (two) times daily.   Yes [provider]  Cholecalciferol (VITAMIN D PO) Take 500 Units by mouth 2 (two) times daily.   Yes [provider]  Cyanocobalamin (VITAMIN B 12 PO) Take 1 tablet by mouth daily.   Yes [provider]  glipiZIDE (GLUCOTROL XL) 10 MG 24 hr tablet Take 10 mg by mouth 2 (two) times daily. 07/25/19  Yes [provider]  HYDROmorphone (DILAUDID) 4 MG tablet Take 4 mg by mouth every 6 (six) hours as needed for moderate pain (pain score 4-6). 08/22/20  Yes [provider]  ibuprofen (ADVIL) 200 MG tablet Take 200 mg by mouth every 6 (six) hours as needed for mild pain (pain score 1-3).   Yes [provider]  insulin glargine (LANTUS SOLOSTAR) 100 UNIT/ML Solostar Pen TAKE 48 UNITS ONCE DAILY. TAKE EACH MORNING. 12/30/21  Yes [provider]  ketoconazole (NIZORAL) 2 % cream Apply to skin folds once daily, twice daily with flares. Patient taking differently: Apply 1 Application topically daily as needed for irritation. 02/04/23  Yes Willeen Niece, MD  lisinopril (ZESTRIL) 20 MG tablet Take 20 mg by mouth daily.   Yes [provider]  magnesium oxide (MAG-OX) 400 MG tablet Take 1 tablet by mouth daily. 01/02/21  Yes  [provider]  metFORMIN (GLUCOPHAGE-XR) 500 MG 24 hr tablet Take 1,000 mg by mouth 2 (two) times daily. 02/25/18  Yes [provider]  metoprolol tartrate (LOPRESSOR) 50 MG tablet Take 50 mg by mouth 2 (two) times daily. 07/30/19  Yes [provider]  pantoprazole (PROTONIX) 40 MG tablet Take 40 mg by mouth 2 (two) times daily. 01/08/21  Yes [provider]  promethazine (PHENERGAN) 25 MG tablet Take 25 mg by mouth every 8 (eight) hours as needed for vomiting or nausea. 08/22/20  Yes [provider]  simvastatin (ZOCOR) 20 MG tablet Take 0.5 tablets (10 mg total) by mouth at bedtime. Patient taking differently: Take 20 mg by mouth at bedtime. 08/11/19 08/31/23 Yes Pyreddy, Vivien Rota, MD  glucose blood test strip 1 each by Other route as directed. Use as instructed    [provider]  Lancet Devices [facility]) lancets 1 each by Other route as needed. Use as instructed    [provider]  pimecrolimus (ELIDEL) 1 % cream Apply to buttocks area twice daily until improved. Patient not taking: Reported on 08/31/2023 02/10/23   Willeen Niece, MD  potassium chloride (KLOR-CON) 20 MEQ packet Take by mouth 2 (two) times daily. Patient not taking: Reported on 08/31/2023    [provider]     Vital Signs: BP (!) 147/64   Pulse 79   Temp 98.1 F (36.7 C) (Axillary)   Resp (!) 24   SpO2 99%   Physical Exam Vitals reviewed.  Skin:    General: Skin is warm and  dry.  Neurological:     Mental Status: She is alert.   Alert, awake, and oriented x3 Patient with some minor deficits in comprehension and has difficulty following some but not all commands. Patient also with intermittent expressive aphasia and sometimes is unable to verbalize and will stop speaking mid-sentence PERRL  EOMs unable to be assessed as patient could not follow commands for evaluation Visual fields intact but patient reports double vision No facial  asymmetry. Tongue midline  Motor power 4/5 on left, 3+/5 on right Mild left pronator drift. Fine motor and coordination not evaluated Gait not assessed Distal pulses palpable Right groin puncture site soft, non-tender, no concern for hematoma or pseudoaneurysm at groin puncture site   Imaging: ECHOCARDIOGRAM COMPLETE BUBBLE STUDY  Result Date: 09/01/2023    ECHOCARDIOGRAM REPORT   Patient Name:   Molly [name] Date of Exam: 09/01/2023 Medical Rec #:  [id number]   Height:       66.0 in Accession #:    [id number]  Weight:       195.5 lb Date of Birth:  08/03/1941   BSA:          1.981 m Patient Age:    82 years    BP:           147/64 mmHg Patient Gender: F           HR:           69 bpm. Exam Location:  Inpatient Procedure: 2D Echo, Cardiac Doppler, Color Doppler and Saline Contrast Bubble            Study Indications:    Stroke I63.9  History:        Patient has no prior history of Echocardiogram examinations.                 Stroke; Risk Factors:Former Smoker, Dyslipidemia, Diabetes and                 Hypertension.  Sonographer:    Dondra Prader RVT RCS Referring Phys: [phone] Reyne Dumas [facility] IMPRESSIONS  1. Left ventricular ejection fraction, by estimation, is 60 to 65%. The left ventricle has normal function. The left ventricle has no regional wall motion abnormalities. Left ventricular diastolic parameters are indeterminate.  2. Right ventricular systolic function is normal. The right ventricular size is normal.  3. The mitral valve is degenerative. Moderate mitral valve regurgitation. No evidence of mitral stenosis. Moderate mitral annular calcification.  4. The aortic valve is normal in structure. Aortic valve regurgitation is mild to moderate. No aortic stenosis is present.  5. The inferior vena cava is normal in size with greater than 50% respiratory variability, suggesting right atrial pressure of 3 mmHg.  6. Agitated saline contrast bubble study was negative, with no evidence of any interatrial  shunt. Conclusion(s)/Recommendation(s): No intracardiac source of embolism detected on this transthoracic study. Consider a transesophageal echocardiogram to exclude cardiac source of embolism if clinically indicated. FINDINGS  Left Ventricle: Left ventricular ejection fraction, by estimation, is 60 to 65%. The left ventricle has normal function. The left ventricle has no regional wall motion abnormalities. The left ventricular internal cavity size was normal in size. There is  no left ventricular hypertrophy. Left ventricular diastolic parameters are indeterminate. Right Ventricle: The right ventricular size is normal. No increase in right ventricular wall thickness. Right ventricular systolic function is normal. Left Atrium: Left atrial size was normal in size. Right Atrium: Right atrial size was normal in size.  Pericardium: There is no evidence of pericardial effusion. Mitral Valve: The mitral valve is degenerative in appearance. There is mild thickening of the mitral valve leaflet(s). Moderate mitral annular calcification. Moderate mitral valve regurgitation. No evidence of mitral valve stenosis. Tricuspid Valve: The tricuspid valve is normal in structure. Tricuspid valve regurgitation is trivial. No evidence of tricuspid stenosis. Aortic Valve: The aortic valve is normal in structure. Aortic valve regurgitation is mild to moderate. Aortic regurgitation PHT measures 434 msec. No aortic stenosis is present. Aortic valve mean gradient measures 4.0 mmHg. Aortic valve peak gradient measures 6.9 mmHg. Aortic valve area, by VTI measures 2.32 cm. Pulmonic Valve: The pulmonic valve was normal in structure. Pulmonic valve regurgitation is not visualized. No evidence of pulmonic stenosis. Aorta: The aortic root is normal in size and structure. Venous: The inferior vena cava is normal in size with greater than 50% respiratory variability, suggesting right atrial pressure of 3 mmHg. IAS/Shunts: No atrial level shunt  detected by color flow Doppler. Agitated saline contrast was given intravenously to evaluate for intracardiac shunting. Agitated saline contrast bubble study was negative, with no evidence of any interatrial shunt.  LEFT VENTRICLE PLAX 2D LVIDd:         4.20 cm   Diastology LVIDs:         2.50 cm   LV e' medial:    5.00 cm/s LV PW:         1.00 cm   LV E/e' medial:  27.2 LV IVS:        1.00 cm   LV e' lateral:   5.75 cm/s LVOT diam:     1.70 cm   LV E/e' lateral: 23.7 LV SV:         76 LV SV Index:   38 LVOT Area:     2.27 cm  RIGHT VENTRICLE             IVC RV S prime:     10.70 cm/s  IVC diam: 2.00 cm TAPSE (M-mode): 2.4 cm LEFT ATRIUM             Index        RIGHT ATRIUM           Index LA diam:        4.30 cm 2.17 cm/m   RA Area:     10.70 cm LA Vol (A2C):   46.5 ml 23.48 ml/m  RA Volume:   21.40 ml  10.81 ml/m LA Vol (A4C):   62.4 ml 31.51 ml/m LA Biplane Vol: 55.6 ml 28.07 ml/m  AORTIC VALVE                    PULMONIC VALVE AV Area (Vmax):    2.36 cm     PV Vmax:       1.03 m/s AV Area (Vmean):   2.29 cm     PV Peak grad:  4.2 mmHg AV Area (VTI):     2.32 cm AV Vmax:           131.00 cm/s AV Vmean:          92.650 cm/s AV VTI:            0.328 m AV Peak Grad:      6.9 mmHg AV Mean Grad:      4.0 mmHg LVOT Vmax:         136.00 cm/s LVOT Vmean:        93.500 cm/s LVOT VTI:  0.335 m LVOT/AV VTI ratio: 1.02 AI PHT:            434 msec AR Vena Contracta: 0.40 cm  AORTA Ao Root diam: 2.70 cm Ao Asc diam:  3.20 cm MITRAL VALVE                TRICUSPID VALVE MV Area (PHT): 5.54 cm     TR Peak grad:   28.1 mmHg MV Decel Time: 137 msec     TR Vmax:        265.00 cm/s MV E velocity: 136.00 cm/s MV A velocity: 117.00 cm/s  SHUNTS MV E/A ratio:  1.16         Systemic VTI:  0.34 m                             Systemic Diam: 1.70 cm Donato Schultz MD Electronically signed by Donato Schultz MD Signature Date/Time: 09/01/2023/2:20:43 PM    Final    MR BRAIN WO CONTRAST  Result Date: 09/01/2023 CLINICAL DATA:   Stroke, follow up EXAM: MRI HEAD WITHOUT CONTRAST TECHNIQUE: Multiplanar, multiecho pulse sequences of the brain and surrounding structures were obtained without intravenous contrast. COMPARISON:  Same day CT head FINDINGS: Brain: Acute left MCA and ACA territory infarcts involving the left frontal lobe, left parietal lobe, and the body and headof the caudate on the left. No hemorrhage. No hydrocephalus. No extra-axial fluid collection. No mass effect. No mass lesion. There is a background of mild chronic microvascular ischemic change. Vascular: Normal flow voids. Skull and upper cervical spine: Normal marrow signal. Sinuses/Orbits: No middle ear or mastoid effusion. Paranasal sinuses are clear. Bilateral lens replacement. Orbits are otherwise unremarkable. Other: None. IMPRESSION: Acute left MCA and ACA territory infarcts involving the left frontal lobe, left parietal lobe, and the body and head of the caudate on the left. No hemorrhage. These results will be called to the ordering clinician or representative by the Radiologist Assistant, and communication documented in the PACS or [employer]. Electronically Signed   By: Lorenza Cambridge M.D.   On: 09/01/2023 14:19   CT HEAD WO CONTRAST ( )  Result Date: 09/01/2023 CLINICAL DATA:  Right-sided deficits. Status post left MCA thrombectomy. EXAM: CT HEAD WITHOUT CONTRAST CT ANGIOGRAPHY OF THE HEAD AND NECK TECHNIQUE: Contiguous axial images were obtained from the base of the skull through the vertex without intravenous contrast. Multidetector CT imaging of the head and neck was performed using the standard protocol during bolus administration of intravenous contrast. Multiplanar CT image reconstructions and MIPs were obtained to evaluate the vascular anatomy. Carotid stenosis measurements (when applicable) are obtained utilizing NASCET criteria, using the distal internal carotid diameter as the denominator. RADIATION DOSE REDUCTION: This exam was performed  according to the departmental dose-optimization program which includes automated exposure control, adjustment of the mA and/or kV according to patient size and/or use of iterative reconstruction technique. CONTRAST:  75mL OMNIPAQUE IOHEXOL 350 MG/ML SOLN COMPARISON:  08/31/2023 FINDINGS: CT HEAD FINDINGS Brain: There is no mass, hemorrhage or extra-axial collection. The size and configuration of the ventricles and extra-axial CSF spaces are normal. There is hypoattenuation of the white matter, most commonly indicating chronic small vessel disease. Skull: The visualized skull base, calvarium and extracranial soft tissues are normal. Sinuses/Orbits: No fluid levels or advanced mucosal thickening of the visualized paranasal sinuses. No mastoid or middle ear effusion. Normal orbits. CTA NECK FINDINGS SKELETON: No acute abnormality or high grade  bony spinal canal stenosis. OTHER NECK: Normal pharynx, larynx and major salivary glands. No cervical lymphadenopathy. Unremarkable thyroid gland. UPPER CHEST: No pneumothorax or pleural effusion. No nodules or masses. AORTIC ARCH: There is calcific atherosclerosis of the aortic arch. Conventional 3 vessel aortic branching pattern. RIGHT CAROTID SYSTEM: No dissection, occlusion or aneurysm. Mild atherosclerotic calcification at the carotid bifurcation without hemodynamically significant stenosis. LEFT CAROTID SYSTEM: No dissection, occlusion or aneurysm. Mild atherosclerotic calcification at the carotid bifurcation without hemodynamically significant stenosis. VERTEBRAL ARTERIES: Right dominant configuration. There is no dissection, occlusion or flow-limiting stenosis to the skull base (V1-V3 segments). CTA HEAD FINDINGS POSTERIOR CIRCULATION: --Vertebral arteries: Normal right V4 segment. Left vertebral artery terminates in PICA and may be occluded proximally, unchanged from the earlier study. --Basilar artery: Normal. --Superior cerebellar arteries: Normal. --Posterior cerebral  arteries (PCA): Normal. ANTERIOR CIRCULATION: --Intracranial internal carotid arteries: Atherosclerotic calcification of the internal carotid arteries at the skull base without hemodynamically significant stenosis. --Anterior cerebral arteries (ACA): Normal. --Middle cerebral arteries (MCA): Normal. VENOUS SINUSES: As permitted by contrast timing, patent. ANATOMIC VARIANTS: None Review of the MIP images confirms the above findings. IMPRESSION: 1. No intracranial arterial occlusion or high-grade stenosis. The left middle cerebral artery is now patent. 2. Unchanged appearance of the left vertebral artery which terminates in PICA and may be occluded proximally. Aortic Atherosclerosis (ICD10-I70.0). Electronically Signed   By: Deatra Robinson M.D.   On: 09/01/2023 03:00   CT ANGIO HEAD NECK W WO CM  Result Date: 09/01/2023 CLINICAL DATA:  Right-sided deficits. Status post left MCA thrombectomy. EXAM: CT HEAD WITHOUT CONTRAST CT ANGIOGRAPHY OF THE HEAD AND NECK TECHNIQUE: Contiguous axial images were obtained from the base of the skull through the vertex without intravenous contrast. Multidetector CT imaging of the head and neck was performed using the standard protocol during bolus administration of intravenous contrast. Multiplanar CT image reconstructions and MIPs were obtained to evaluate the vascular anatomy. Carotid stenosis measurements (when applicable) are obtained utilizing NASCET criteria, using the distal internal carotid diameter as the denominator. RADIATION DOSE REDUCTION: This exam was performed according to the departmental dose-optimization program which includes automated exposure control, adjustment of the mA and/or kV according to patient size and/or use of iterative reconstruction technique. CONTRAST:  75mL OMNIPAQUE IOHEXOL 350 MG/ML SOLN COMPARISON:  08/31/2023 FINDINGS: CT HEAD FINDINGS Brain: There is no mass, hemorrhage or extra-axial collection. The size and configuration of the ventricles  and extra-axial CSF spaces are normal. There is hypoattenuation of the white matter, most commonly indicating chronic small vessel disease. Skull: The visualized skull base, calvarium and extracranial soft tissues are normal. Sinuses/Orbits: No fluid levels or advanced mucosal thickening of the visualized paranasal sinuses. No mastoid or middle ear effusion. Normal orbits. CTA NECK FINDINGS SKELETON: No acute abnormality or high grade bony spinal canal stenosis. OTHER NECK: Normal pharynx, larynx and major salivary glands. No cervical lymphadenopathy. Unremarkable thyroid gland. UPPER CHEST: No pneumothorax or pleural effusion. No nodules or masses. AORTIC ARCH: There is calcific atherosclerosis of the aortic arch. Conventional 3 vessel aortic branching pattern. RIGHT CAROTID SYSTEM: No dissection, occlusion or aneurysm. Mild atherosclerotic calcification at the carotid bifurcation without hemodynamically significant stenosis. LEFT CAROTID SYSTEM: No dissection, occlusion or aneurysm. Mild atherosclerotic calcification at the carotid bifurcation without hemodynamically significant stenosis. VERTEBRAL ARTERIES: Right dominant configuration. There is no dissection, occlusion or flow-limiting stenosis to the skull base (V1-V3 segments). CTA HEAD FINDINGS POSTERIOR CIRCULATION: --Vertebral arteries: Normal right V4 segment. Left vertebral artery terminates in PICA and  may be occluded proximally, unchanged from the earlier study. --Basilar artery: Normal. --Superior cerebellar arteries: Normal. --Posterior cerebral arteries (PCA): Normal. ANTERIOR CIRCULATION: --Intracranial internal carotid arteries: Atherosclerotic calcification of the internal carotid arteries at the skull base without hemodynamically significant stenosis. --Anterior cerebral arteries (ACA): Normal. --Middle cerebral arteries (MCA): Normal. VENOUS SINUSES: As permitted by contrast timing, patent. ANATOMIC VARIANTS: None Review of the MIP images  confirms the above findings. IMPRESSION: 1. No intracranial arterial occlusion or high-grade stenosis. The left middle cerebral artery is now patent. 2. Unchanged appearance of the left vertebral artery which terminates in PICA and may be occluded proximally. Aortic Atherosclerosis (ICD10-I70.0). Electronically Signed   By: Deatra Robinson M.D.   On: 09/01/2023 03:00   IR PERCUTANEOUS ART THROMBECTOMY/INFUSION INTRACRANIAL INC DIAG ANGIO  Result Date: 08/31/2023 INDICATION: 82 year old female with past medical history significant for hypertension and hyperlipidemia who presented to [facility] after acute onset of right sided weakness and dysarthria; NIHSS 7. Her last known well was 8:30 a.m. Head CT showed no acute process. She received IV TNK at 0958. CT angiogram of the head and neck showed severe left M1/MCA stenosis without acute occlusion. Her neurological exam improved after TNK (NIHSS = 4) and it was felt that no intervention was indicated at that time. However, at 11 a.m., she showed a significant neurological decline with significantly worse aphasia and worse right-sided weakness; NIHSS = 18. CT head showed no evidence of hemorrhagic conversion but CT angiogram of the head and neck showed new acute L M1 occlusion. She was then transferred to our service for mechanical thrombectomy. EXAM: ULTRASOUND-GUIDED VASCULAR ACCESS DIAGNOSTIC CEREBRAL ANGIOGRAM MECHANICAL THROMBECTOMY FLAT PANEL HEAD CT COMPARISON:  CT/CT angiogram of the head and neck August 31, 2023. MEDICATIONS: No antibiotics administered. ANESTHESIA/SEDATION: The procedure was performed under general anesthesia. CONTRAST:  75 mL of Omnipaque 200 milligram/mL. FLUOROSCOPY: Radiation Exposure Index (as provided by the fluoroscopic device): 1051 mGy Kerma COMPLICATIONS: None immediate. TECHNIQUE: Informed written consent was obtained from the patient's husband after a thorough discussion of the procedural risks, benefits  and alternatives. All questions were addressed. Maximal Sterile Barrier Technique was utilized including caps, mask, sterile gowns, sterile gloves, sterile drape, hand hygiene and skin antiseptic. A timeout was performed prior to the initiation of the procedure. The right groin was prepped and draped in the usual sterile fashion. Using a micropuncture kit and the modified Seldinger technique, access was gained to the right common femoral artery and an 8 French sheath was placed. Real-time ultrasound guidance was utilized for vascular access including the acquisition of a permanent ultrasound image documenting patency of the accessed vessel. Under fluoroscopy, a Zoom 88 guide catheter was navigated over a 6 [place] Berenstein 2 catheter and a 0.035" Terumo Glidewire into the aortic arch. The catheter was placed into the left common carotid artery and then advanced into the left internal carotid artery. The diagnostic catheter was removed. Frontal and lateral angiograms of the head were obtained. FINDINGS: 1. Normal caliber of the right common femoral artery, adequate for vascular access. 2. Since prior CT angiogram, there has been clot fragmentation with subocclusive clot in the left M1/MCA extending into and occluding the M2/MCA superior division branch. 3. There is subocclusive clot in a distal left M2/MCA inferior division branch as well as multiple clots scattered throughout distal branches of the M2/MCA inferior division. 4. Nonocclusive clot in a distal left callosomarginal artery branch with slow anterograde flow and brisk retrograde opacification from left MCA collaterals.  PROCEDURE: Using biplane roadmap guidance, a FreeClimb 70 aspiration catheter was navigated over Tenzing delivery catheter and an Aristotle 14 microguidewire into the cavernous segment of the left ICA. The aspiration catheter was then advanced to the level of occlusion in the proximal left M2/MCA superior division branch and connected to an  aspiration pump. Continuous aspiration was performed for 2 minutes. The guide catheter was connected to a VacLok syringe. The aspiration catheter was subsequently removed under constant aspiration. The guide catheter was aspirated for debris. Left internal carotid artery angiograms with frontal and lateral views of the head showed recanalization of the left M2/MCA superior division branch. There is also resolution of the filling defect within the M1 segment. However there is distal embolization to a distal M2/MCA posterior division branch. Using biplane roadmap guidance, a Red 43 aspiration catheter was navigated over an Aristotle 14 microguidewire into the cavernous segment of the left ICA. The aspiration catheter was then advanced to the level of occlusion in the distal M2 segment and connected to an aspiration pump. Continuous aspiration was performed for 2 minutes. The guide catheter was connected to a VacLok syringe. The aspiration catheter was subsequently removed under constant aspiration. The guide catheter was aspirated for debris. Left internal carotid artery angiograms with frontal and lateral views of the head showed recanalization of the M2 segment with distal embolus show and M3 segment. Persistent filling defect in a separate M2 branch with slow anterograde flow. Using biplane roadmap guidance, a Socrate 38 aspiration catheter was navigated over an Aristotle 14 microguidewire into the cavernous segment of the left ICA. The aspiration catheter was then advanced to the level of occlusion in the distal M2 segment and connected to an aspiration pump. Continuous aspiration was performed for 2 minutes. The guide catheter was connected to a VacLok syringe. The aspiration catheter was subsequently removed under constant aspiration. The guide catheter was aspirated for debris. Left internal carotid artery angiograms with frontal lateral views of the head showed recanalization of the proximal M3 segment with  further embolization into the M4 segment. Using biplane roadmap guidance, a Red 43 aspiration catheter was navigated over an Aristotle 14 microguidewire into the cavernous segment of the left ICA. The aspiration catheter was then advanced to the level of occlusion in a different distal M2 segment and connected to an aspiration pump. Continuous aspiration was performed for 2 minutes. The guide catheter was connected to a VacLok syringe. The aspiration catheter was subsequently removed under constant aspiration. The guide catheter was aspirated for debris. Left internal carotid artery angiograms showed recanalization of the M2 segment. Persistent filling defect within the a left M4/MCA branch to the posterior parietal region and to the posterior temporal region. Using biplane roadmap guidance, a Red 43 aspiration catheter was navigated over an Aristotle 14 microguidewire into the cavernous segment of the left ICA. The wire was then advanced to the level of the filling defect in the M4 segment to the posterior parietal region. The aspiration catheter was not advanced to that level. Frontal and lateral angiograms of the head showed slow flow to the M4 branches the posterior parietal region with persistent occlusion of the branch to the posterior temporal region. Flat panel CT of the head was obtained and post processed in a separate workstation with concurrent attending physician supervision. Selected images were sent to PACS. Follow-up left internal carotid artery angiograms with frontal lateral views of the head showed stable findings in the left MCA vascular tree, as well as left ACA  with brisk retrograde flow of a distal branch of the left callosum marginal artery via leptomeningeal collaterals. The catheter was retracted into the left common carotid artery. Contrast injection showed no evidence of stenosis or filling defect in the cervical left ICA. The catheter was subsequently withdrawn. Right common femoral  artery angiogram was obtained in right anterior oblique view. The puncture is at the level of the common femoral artery. The artery has normal caliber, adequate for closure device. The sheath was exchanged over the wire for a Perclose prostyle which was utilized for access closure. Immediate hemostasis was achieved. IMPRESSION: 1. Mechanical thrombectomy performed for treatment of subocclusive left M1/MCA filling defect, a occlusion of the proximal left M2/MCA superior division branch, distal left M2/MCA posterior division branch achieving adequate recanalization (TICI 2B). Persistent slow flow in a posterior parietal cortical branch and persistent occlusion of a posterior temporal cortical branch seen on final angiogram. 2. Nonocclusive clot in a distal left callosomarginal artery branch with slow anterograde flow and brisk retrograde opacification from left MCA leptomeningeal collaterals. PLAN: ICU level of care. Electronically Signed   By: Baldemar Lenis M.D.   On: 08/31/2023 15:55   IR US Guide Vasc Access Right  Result Date: 08/31/2023 INDICATION: 82 year old female with past medical history significant for hypertension and hyperlipidemia who presented to [facility] after acute onset of right sided weakness and dysarthria; NIHSS 7. Her last known well was 8:30 a.m. Head CT showed no acute process. She received IV TNK at 0958. CT angiogram of the head and neck showed severe left M1/MCA stenosis without acute occlusion. Her neurological exam improved after TNK (NIHSS = 4) and it was felt that no intervention was indicated at that time. However, at 11 a.m., she showed a significant neurological decline with significantly worse aphasia and worse right-sided weakness; NIHSS = 18. CT head showed no evidence of hemorrhagic conversion but CT angiogram of the head and neck showed new acute L M1 occlusion. She was then transferred to our service for mechanical thrombectomy. EXAM:  ULTRASOUND-GUIDED VASCULAR ACCESS DIAGNOSTIC CEREBRAL ANGIOGRAM MECHANICAL THROMBECTOMY FLAT PANEL HEAD CT COMPARISON:  CT/CT angiogram of the head and neck August 31, 2023. MEDICATIONS: No antibiotics administered. ANESTHESIA/SEDATION: The procedure was performed under general anesthesia. CONTRAST:  75 mL of Omnipaque 200 milligram/mL. FLUOROSCOPY: Radiation Exposure Index (as provided by the fluoroscopic device): 1051 mGy Kerma COMPLICATIONS: None immediate. TECHNIQUE: Informed written consent was obtained from the patient's husband after a thorough discussion of the procedural risks, benefits and alternatives. All questions were addressed. Maximal Sterile Barrier Technique was utilized including caps, mask, sterile gowns, sterile gloves, sterile drape, hand hygiene and skin antiseptic. A timeout was performed prior to the initiation of the procedure. The right groin was prepped and draped in the usual sterile fashion. Using a micropuncture kit and the modified Seldinger technique, access was gained to the right common femoral artery and an 8 French sheath was placed. Real-time ultrasound guidance was utilized for vascular access including the acquisition of a permanent ultrasound image documenting patency of the accessed vessel. Under fluoroscopy, a Zoom 88 guide catheter was navigated over a 6 [place] Berenstein 2 catheter and a 0.035" Terumo Glidewire into the aortic arch. The catheter was placed into the left common carotid artery and then advanced into the left internal carotid artery. The diagnostic catheter was removed. Frontal and lateral angiograms of the head were obtained. FINDINGS: 1. Normal caliber of the right common femoral artery, adequate for vascular access.  2. Since prior CT angiogram, there has been clot fragmentation with subocclusive clot in the left M1/MCA extending into and occluding the M2/MCA superior division branch. 3. There is subocclusive clot in a distal left M2/MCA inferior division  branch as well as multiple clots scattered throughout distal branches of the M2/MCA inferior division. 4. Nonocclusive clot in a distal left callosomarginal artery branch with slow anterograde flow and brisk retrograde opacification from left MCA collaterals. PROCEDURE: Using biplane roadmap guidance, a FreeClimb 70 aspiration catheter was navigated over Tenzing delivery catheter and an Aristotle 14 microguidewire into the cavernous segment of the left ICA. The aspiration catheter was then advanced to the level of occlusion in the proximal left M2/MCA superior division branch and connected to an aspiration pump. Continuous aspiration was performed for 2 minutes. The guide catheter was connected to a VacLok syringe. The aspiration catheter was subsequently removed under constant aspiration. The guide catheter was aspirated for debris. Left internal carotid artery angiograms with frontal and lateral views of the head showed recanalization of the left M2/MCA superior division branch. There is also resolution of the filling defect within the M1 segment. However there is distal embolization to a distal M2/MCA posterior division branch. Using biplane roadmap guidance, a Red 43 aspiration catheter was navigated over an Aristotle 14 microguidewire into the cavernous segment of the left ICA. The aspiration catheter was then advanced to the level of occlusion in the distal M2 segment and connected to an aspiration pump. Continuous aspiration was performed for 2 minutes. The guide catheter was connected to a VacLok syringe. The aspiration catheter was subsequently removed under constant aspiration. The guide catheter was aspirated for debris. Left internal carotid artery angiograms with frontal and lateral views of the head showed recanalization of the M2 segment with distal embolus show and M3 segment. Persistent filling defect in a separate M2 branch with slow anterograde flow. Using biplane roadmap guidance, a Socrate 38  aspiration catheter was navigated over an Aristotle 14 microguidewire into the cavernous segment of the left ICA. The aspiration catheter was then advanced to the level of occlusion in the distal M2 segment and connected to an aspiration pump. Continuous aspiration was performed for 2 minutes. The guide catheter was connected to a VacLok syringe. The aspiration catheter was subsequently removed under constant aspiration. The guide catheter was aspirated for debris. Left internal carotid artery angiograms with frontal lateral views of the head showed recanalization of the proximal M3 segment with further embolization into the M4 segment. Using biplane roadmap guidance, a Red 43 aspiration catheter was navigated over an Aristotle 14 microguidewire into the cavernous segment of the left ICA. The aspiration catheter was then advanced to the level of occlusion in a different distal M2 segment and connected to an aspiration pump. Continuous aspiration was performed for 2 minutes. The guide catheter was connected to a VacLok syringe. The aspiration catheter was subsequently removed under constant aspiration. The guide catheter was aspirated for debris. Left internal carotid artery angiograms showed recanalization of the M2 segment. Persistent filling defect within the a left M4/MCA branch to the posterior parietal region and to the posterior temporal region. Using biplane roadmap guidance, a Red 43 aspiration catheter was navigated over an Aristotle 14 microguidewire into the cavernous segment of the left ICA. The wire was then advanced to the level of the filling defect in the M4 segment to the posterior parietal region. The aspiration catheter was not advanced to that level. Frontal and lateral angiograms of the head  showed slow flow to the M4 branches the posterior parietal region with persistent occlusion of the branch to the posterior temporal region. Flat panel CT of the head was obtained and post processed in a  separate workstation with concurrent attending physician supervision. Selected images were sent to PACS. Follow-up left internal carotid artery angiograms with frontal lateral views of the head showed stable findings in the left MCA vascular tree, as well as left ACA with brisk retrograde flow of a distal branch of the left callosum marginal artery via leptomeningeal collaterals. The catheter was retracted into the left common carotid artery. Contrast injection showed no evidence of stenosis or filling defect in the cervical left ICA. The catheter was subsequently withdrawn. Right common femoral artery angiogram was obtained in right anterior oblique view. The puncture is at the level of the common femoral artery. The artery has normal caliber, adequate for closure device. The sheath was exchanged over the wire for a Perclose prostyle which was utilized for access closure. Immediate hemostasis was achieved. IMPRESSION: 1. Mechanical thrombectomy performed for treatment of subocclusive left M1/MCA filling defect, a occlusion of the proximal left M2/MCA superior division branch, distal left M2/MCA posterior division branch achieving adequate recanalization (TICI 2B). Persistent slow flow in a posterior parietal cortical branch and persistent occlusion of a posterior temporal cortical branch seen on final angiogram. 2. Nonocclusive clot in a distal left callosomarginal artery branch with slow anterograde flow and brisk retrograde opacification from left MCA leptomeningeal collaterals. PLAN: ICU level of care. Electronically Signed   By: Baldemar Lenis M.D.   On: 08/31/2023 15:55   IR CT Head Ltd  Result Date: 08/31/2023 INDICATION: 82 year old female with past medical history significant for hypertension and hyperlipidemia who presented to [facility] after acute onset of right sided weakness and dysarthria; NIHSS 7. Her last known well was 8:30 a.m. Head CT showed no acute  process. She received IV TNK at 0958. CT angiogram of the head and neck showed severe left M1/MCA stenosis without acute occlusion. Her neurological exam improved after TNK (NIHSS = 4) and it was felt that no intervention was indicated at that time. However, at 11 a.m., she showed a significant neurological decline with significantly worse aphasia and worse right-sided weakness; NIHSS = 18. CT head showed no evidence of hemorrhagic conversion but CT angiogram of the head and neck showed new acute L M1 occlusion. She was then transferred to our service for mechanical thrombectomy. EXAM: ULTRASOUND-GUIDED VASCULAR ACCESS DIAGNOSTIC CEREBRAL ANGIOGRAM MECHANICAL THROMBECTOMY FLAT PANEL HEAD CT COMPARISON:  CT/CT angiogram of the head and neck August 31, 2023. MEDICATIONS: No antibiotics administered. ANESTHESIA/SEDATION: The procedure was performed under general anesthesia. CONTRAST:  75 mL of Omnipaque 200 milligram/mL. FLUOROSCOPY: Radiation Exposure Index (as provided by the fluoroscopic device): 1051 mGy Kerma COMPLICATIONS: None immediate. TECHNIQUE: Informed written consent was obtained from the patient's husband after a thorough discussion of the procedural risks, benefits and alternatives. All questions were addressed. Maximal Sterile Barrier Technique was utilized including caps, mask, sterile gowns, sterile gloves, sterile drape, hand hygiene and skin antiseptic. A timeout was performed prior to the initiation of the procedure. The right groin was prepped and draped in the usual sterile fashion. Using a micropuncture kit and the modified Seldinger technique, access was gained to the right common femoral artery and an 8 French sheath was placed. Real-time ultrasound guidance was utilized for vascular access including the acquisition of a permanent ultrasound image documenting patency of the accessed  vessel. Under fluoroscopy, a Zoom 88 guide catheter was navigated over a 6 [place] Berenstein 2 catheter and a  0.035" Terumo Glidewire into the aortic arch. The catheter was placed into the left common carotid artery and then advanced into the left internal carotid artery. The diagnostic catheter was removed. Frontal and lateral angiograms of the head were obtained. FINDINGS: 1. Normal caliber of the right common femoral artery, adequate for vascular access. 2. Since prior CT angiogram, there has been clot fragmentation with subocclusive clot in the left M1/MCA extending into and occluding the M2/MCA superior division branch. 3. There is subocclusive clot in a distal left M2/MCA inferior division branch as well as multiple clots scattered throughout distal branches of the M2/MCA inferior division. 4. Nonocclusive clot in a distal left callosomarginal artery branch with slow anterograde flow and brisk retrograde opacification from left MCA collaterals. PROCEDURE: Using biplane roadmap guidance, a FreeClimb 70 aspiration catheter was navigated over Tenzing delivery catheter and an Aristotle 14 microguidewire into the cavernous segment of the left ICA. The aspiration catheter was then advanced to the level of occlusion in the proximal left M2/MCA superior division branch and connected to an aspiration pump. Continuous aspiration was performed for 2 minutes. The guide catheter was connected to a VacLok syringe. The aspiration catheter was subsequently removed under constant aspiration. The guide catheter was aspirated for debris. Left internal carotid artery angiograms with frontal and lateral views of the head showed recanalization of the left M2/MCA superior division branch. There is also resolution of the filling defect within the M1 segment. However there is distal embolization to a distal M2/MCA posterior division branch. Using biplane roadmap guidance, a Red 43 aspiration catheter was navigated over an Aristotle 14 microguidewire into the cavernous segment of the left ICA. The aspiration catheter was then advanced to the  level of occlusion in the distal M2 segment and connected to an aspiration pump. Continuous aspiration was performed for 2 minutes. The guide catheter was connected to a VacLok syringe. The aspiration catheter was subsequently removed under constant aspiration. The guide catheter was aspirated for debris. Left internal carotid artery angiograms with frontal and lateral views of the head showed recanalization of the M2 segment with distal embolus show and M3 segment. Persistent filling defect in a separate M2 branch with slow anterograde flow. Using biplane roadmap guidance, a Socrate 38 aspiration catheter was navigated over an Aristotle 14 microguidewire into the cavernous segment of the left ICA. The aspiration catheter was then advanced to the level of occlusion in the distal M2 segment and connected to an aspiration pump. Continuous aspiration was performed for 2 minutes. The guide catheter was connected to a VacLok syringe. The aspiration catheter was subsequently removed under constant aspiration. The guide catheter was aspirated for debris. Left internal carotid artery angiograms with frontal lateral views of the head showed recanalization of the proximal M3 segment with further embolization into the M4 segment. Using biplane roadmap guidance, a Red 43 aspiration catheter was navigated over an Aristotle 14 microguidewire into the cavernous segment of the left ICA. The aspiration catheter was then advanced to the level of occlusion in a different distal M2 segment and connected to an aspiration pump. Continuous aspiration was performed for 2 minutes. The guide catheter was connected to a VacLok syringe. The aspiration catheter was subsequently removed under constant aspiration. The guide catheter was aspirated for debris. Left internal carotid artery angiograms showed recanalization of the M2 segment. Persistent filling defect within the a left M4/MCA  branch to the posterior parietal region and to the  posterior temporal region. Using biplane roadmap guidance, a Red 43 aspiration catheter was navigated over an Aristotle 14 microguidewire into the cavernous segment of the left ICA. The wire was then advanced to the level of the filling defect in the M4 segment to the posterior parietal region. The aspiration catheter was not advanced to that level. Frontal and lateral angiograms of the head showed slow flow to the M4 branches the posterior parietal region with persistent occlusion of the branch to the posterior temporal region. Flat panel CT of the head was obtained and post processed in a separate workstation with concurrent attending physician supervision. Selected images were sent to PACS. Follow-up left internal carotid artery angiograms with frontal lateral views of the head showed stable findings in the left MCA vascular tree, as well as left ACA with brisk retrograde flow of a distal branch of the left callosum marginal artery via leptomeningeal collaterals. The catheter was retracted into the left common carotid artery. Contrast injection showed no evidence of stenosis or filling defect in the cervical left ICA. The catheter was subsequently withdrawn. Right common femoral artery angiogram was obtained in right anterior oblique view. The puncture is at the level of the common femoral artery. The artery has normal caliber, adequate for closure device. The sheath was exchanged over the wire for a Perclose prostyle which was utilized for access closure. Immediate hemostasis was achieved. IMPRESSION: 1. Mechanical thrombectomy performed for treatment of subocclusive left M1/MCA filling defect, a occlusion of the proximal left M2/MCA superior division branch, distal left M2/MCA posterior division branch achieving adequate recanalization (TICI 2B). Persistent slow flow in a posterior parietal cortical branch and persistent occlusion of a posterior temporal cortical branch seen on final angiogram. 2. Nonocclusive  clot in a distal left callosomarginal artery branch with slow anterograde flow and brisk retrograde opacification from left MCA leptomeningeal collaterals. PLAN: ICU level of care. Electronically Signed   By: Baldemar Lenis M.D.   On: 08/31/2023 15:55   CT ANGIO HEAD NECK W WO CM (CODE STROKE)  Result Date: 08/31/2023 CLINICAL DATA:  Right-sided weakness, received TNK 2 hours ago EXAM: CT ANGIOGRAPHY HEAD AND NECK WITH AND WITHOUT CONTRAST TECHNIQUE: Multidetector CT imaging of the head and neck was performed using the standard protocol during bolus administration of intravenous contrast. Multiplanar CT image reconstructions and MIPs were obtained to evaluate the vascular anatomy. Carotid stenosis measurements (when applicable) are obtained utilizing NASCET criteria, using the distal internal carotid diameter as the denominator. RADIATION DOSE REDUCTION: This exam was performed according to the departmental dose-optimization program which includes automated exposure control, adjustment of the mA and/or kV according to patient size and/or use of iterative reconstruction technique. CONTRAST:  75mL OMNIPAQUE IOHEXOL 350 MG/ML SOLN COMPARISON:  Same-day CT/CTA head and neck FINDINGS: CT HEAD FINDINGS Brain: There is no acute intracranial hemorrhage, extra-axial fluid collection, or acute infarct. ASPECTS is 10. Parenchymal volume is normal. The ventricles are normal in size. Gray-white differentiation is preserved. The pituitary and suprasellar region are normal. There is no mass lesion. There is no mass effect or midline shift. Vascular: See below. Skull: Normal. Negative for fracture or focal lesion. Sinuses/Orbits: The paranasal sinuses are clear. Bilateral lens implants are in place. The globes and orbits are otherwise unremarkable. Other: The mastoid air cells and middle ear cavities are clear. Review of the MIP images confirms the above findings CTA NECK FINDINGS Aortic arch: There is mild  plaque in the imaged aortic arch. The origins of the major branch vessels are patent. The subclavian arteries are patent to the level imaged. Right carotid system: The right common, internal, and external carotid arteries are patent, with mild plaque at the bifurcation but no hemodynamically significant stenosis or occlusion. There is no evidence of dissection or aneurysm. Left carotid system: The left common, internal, and external carotid arteries are patent, with mild plaque at the bifurcation but no hemodynamically significant stenosis or occlusion. There is no evidence of dissection or aneurysm. Vertebral arteries: The right vertebral artery is dominant. The vertebral arteries are patent in the neck, without hemodynamically significant stenosis or occlusion there is no evidence of dissection or aneurysm. Skeleton: There is no acute osseous abnormality or suspicious osseous lesion. There is no visible canal hematoma. Other neck: The soft tissues of the neck are unremarkable. Upper chest: There is interlobular septal thickening in the lung apices. Review of the MIP images confirms the above findings CTA HEAD FINDINGS Anterior circulation: There is calcified plaque in the intracranial ICAs resulting in mild-to-moderate stenosis bilaterally. The left M1 segment is patent proximally. The distal M1 segment/proximal superior and inferior M2 branches are now occluded in the MCA cistern (10-56). There is diminished flow in the distal MCA territory (12-20). The right MCA and bilateral ACAS are patent, without proximal high-grade stenosis or occlusion. The anterior communicating artery is normal. There is no aneurysm or AVM. Posterior circulation: The left vertebral artery is occluded at the V3/V4 junction. The dominant right V4 segment is patent. The basilar artery is patent. The bilateral PCAs are patent, without proximal high-grade stenosis or occlusion. Posterior communicating arteries are not identified. There is no  aneurysm or AVM. Venous sinuses: As permitted by contrast timing, patent. Anatomic variants: None. Review of the MIP images confirms the above findings IMPRESSION: 1. The distal left M1 segment/proximal superior and inferior M2 segments are now occluded for a short-segment with diminished collateral flow throughout the distal MCA territory. 2. No evidence of evolved infarct in the left MCA distribution or intracranial hemorrhage. Findings communicated with Dr Selina Cooley at 11:42 am. Electronically Signed   By: Lesia Hausen M.D.   On: 08/31/2023 11:57   CT ANGIO HEAD NECK W WO CM (CODE STROKE)  Result Date: 08/31/2023 CLINICAL DATA:  Neuro deficit, acute, stroke suspected R sided weakness and dysarthria EXAM: CT ANGIOGRAPHY HEAD AND NECK WITH AND WITHOUT CONTRAST TECHNIQUE: Multidetector CT imaging of the head and neck was performed using the standard protocol during bolus administration of intravenous contrast. Multiplanar CT image reconstructions and MIPs were obtained to evaluate the vascular anatomy. Carotid stenosis measurements (when applicable) are obtained utilizing NASCET criteria, using the distal internal carotid diameter as the denominator. RADIATION DOSE REDUCTION: This exam was performed according to the departmental dose-optimization program which includes automated exposure control, adjustment of the mA and/or kV according to patient size and/or use of iterative reconstruction technique. CONTRAST:  75mL OMNIPAQUE IOHEXOL 350 MG/ML SOLN COMPARISON:  Same day CT head FINDINGS: CT HEAD FINDINGS See same day CT head for intracranial findings. No contrast-enhancing lesions visualized. CTA NECK FINDINGS Aortic arch: Standard branching. Imaged portion shows no evidence of aneurysm or dissection. No significant stenosis of the major arch vessel origins. Right carotid system: No evidence of dissection, stenosis (50% or greater), or occlusion. Left carotid system: No evidence of dissection, stenosis (50% or  greater), or occlusion. Vertebral arteries: Right-dominant. The left vertebral artery terminates in the PICA which appears severely stenosed  proximally. No evidence of dissection, stenosis (50% or greater), or occlusion. Skeleton: Negative. Other neck: Negative. Upper chest: Negative. Review of the MIP images confirms the above findings CTA HEAD FINDINGS Anterior circulation: No aneurysm. No vascular malformation. There is severe nearly occlusive stenosis in the proximal left M1 segment in the region of the dense calcification seen on same day CT head. Bilateral ACAs are normal in caliber. Posterior circulation: No significant stenosis, proximal occlusion, aneurysm, or vascular malformation. Venous sinuses: As permitted by contrast timing, patent. Anatomic variants: None Review of the MIP images confirms the above findings IMPRESSION: 1. Severe nearly occlusive stenosis in the proximal left M1 segment in the region of the dense calcification seen on same day CT head. There is flow distal to the site of stenosis. While this could represent chronic severe stenosis secondary to calcified atherosclerotic plaque, a component of superimposed acute on chronic thrombus is not entirely excluded. 2. The left vertebral artery terminates in a PICA. The left PICA appears severely stenosed to nearly occluded proximally. 3. No hemodynamically significant stenosis in the neck. Electronically Signed   By: Lorenza Cambridge M.D.   On: 08/31/2023 10:35   CT HEAD CODE STROKE WO CONTRAST  Result Date: 08/31/2023 CLINICAL DATA:  Code stroke.  Right-sided weakness EXAM: CT HEAD WITHOUT CONTRAST TECHNIQUE: Contiguous axial images were obtained from the base of the skull through the vertex without intravenous contrast. RADIATION DOSE REDUCTION: This exam was performed according to the departmental dose-optimization program which includes automated exposure control, adjustment of the mA and/or kV according to patient size and/or use of  iterative reconstruction technique. COMPARISON:  None Available. FINDINGS: Brain: No hemorrhage. No hydrocephalus. No extra-axial fluid collection. No CT evidence of an acute cortical infarct. No mass effect. No mass lesion. Likely chronic mineralization in the left cerebellum. Chronic left frontal lobe infarct. Vascular: No disproportionately hyperdense vessel. Dense calcification in the proximal left M1. Skull: Normal. Negative for fracture or focal lesion. Sinuses/Orbits: No middle ear or mastoid effusion. Paranasal sinuses are clear. Bilateral lens replacement. Orbits are otherwise unremarkable. Other: None. ASPECTS [facility] Stroke Program Early CT Score): 10 when accounting for chronic findings. IMPRESSION: 1. No hemorrhage or CT evidence of an acute cortical infarct. 2. Chronic left frontal lobe infarct. 3. Dense calcification in the proximal left M1. No disproportionately hyperdense vessel visualized. Findings were paged to Dr. Selina Cooley on 08/31/2023 at 9:53 a.m. via AMION paging system Electronically Signed   By: Lorenza Cambridge M.D.   On: 08/31/2023 09:53    Labs:  CBC: Recent Labs    08/31/23 0930 09/01/23 0656  WBC 8.2 14.2*  HGB 14.3 11.9*  HCT 43.7 35.4*  PLT 224 222    COAGS: Recent Labs    08/31/23 0930  INR 1.0  APTT 30    BMP: Recent Labs    02/10/23 1503 08/31/23 0930 09/01/23 0656  NA 143 140 140  K 5.9* 3.7 3.7  CL 102 106 105  CO2  --  22 24  GLUCOSE 217* 160* 131*  BUN 20 15 11   CALCIUM 10.8* 8.9 8.3*  CREATININE 0.87 0.76 0.72  GFRNONAA  --  >60 >60    LIVER FUNCTION TESTS: Recent Labs    02/10/23 1503 08/31/23 0930 09/01/23 0656  BILITOT 0.5 1.0 0.6  AST 35 37 23  ALT  --  26 23  ALKPHOS 62 33* 25*  PROT 7.2 6.8 5.5*  ALBUMIN 4.9* 4.0 3.1*    Assessment and Plan:  Left MCA  occlusion s/p mechanical thrombectomy -Patient with right-sided weakness, intermittent expressive aphasia, and intermittent difficulty follow commands following mechanical  thrombectomy on 08/31/23 -No concern for hematoma or pseudoaneurysm at right groin puncture site -Further treatment per Neurology team -No further intervention from NIR needed at this time. Please contact NIR team with any questions or concerns   Electronically Signed: Kennieth Francois, PA-C 09/01/2023, 3:13 PM   I spent a total of 15 Minutes at the the patient's bedside AND on the patient's hospital floor or unit, greater than 50% of which was counseling/coordinating care for Left MCA occlusion s/p mechanical thrombectomy

## 2023-09-01 NOTE — H&P (Signed)
Neurology H&P  Reason for Consult: Code Stroke transfer from [facility] for neuro IR Referring Physician: Dr. Selina Cooley  CC: Patient is unable to provide chief complaint due to patient condition.  Patient is aphasic.  History is obtained from: Chart review, [facility], unable to obtain from patient due to aphasia, no family present on patient arrival to [facility], unable to reach emergency contact via telephone  HPI: Molly [name] is a 82 y.o. female with a medical history significant for type 2 diabetes mellitus, nonalcoholic fatty liver disease, GERD, essential hypertension, hyperlipidemia, and nonspecific cardiac arrhythmia who initially presented to [facility] ED this morning for evaluation of acute onset of right-sided weakness while eating breakfast.  Patient was eating breakfast in her usual state of health when at 08:30, she was having trouble feeding herself with her right hand and patient's husband noted that Molly [name] had right facial weakness and slurred speech.  On initial evaluation at [facility] ED, patient was given TNK for her symptoms on presentation with imaging concerning for severe left M1 stenosis without acute occlusion.  Following TNKase, patient had some improvement in her exam NIHSS 8 initially improved to a 4 following TNKase administration.  At 11:15, patient had an acute worsening of exam with acute onset of expressive and receptive aphasia and worsening right-sided weakness.  Repeat imaging was obtained revealing an acute M1 MCA occlusion and transport was activated for [facility] interventional radiology.  [facility] reports that approximately 15 minutes prior to [facility] arrival, the patient had improving right-sided weakness and she was speaking with transport staff with delayed responses but no aphasia.  On arrival, patient was aphasic with right hemiparesis, left gaze, right homonymous hemianopsia, and right-sided facial droop and patient was taken directly to the IR suite for intervention.    LKW: 08:30 AM  TNK given?: Yes, TNKase was administered at [facility] at 09:58 IR Thrombectomy? Yes, patient exam worsening after TNKase administration prompted further neuroimaging at [facility] and repeat CTA head and neck imaging revealed distal left M1 segment/proximal superior and inferior M2 segment occlusion.  Discussion of risks, benefits, and alternatives were completed by Dr. Selina Cooley and consent was signed by patient's husband prior to transfer.  Modified Rankin Scale: 0-Completely asymptomatic and back to baseline post- stroke  ROS: Unable to obtain due to patient's condition, acutely aphasic on arrival.   Past Medical History:  Diagnosis Date   Arrhythmia    Arthritis    Basal cell carcinoma    right neck, right forehead - treated in the past   Cancer (HCC)    skin   CMV (cytomegalovirus infection) (HCC)    Colon polyps    Diabetes mellitus, type 2 (HCC)    Duodenitis    Dysrhythmia    Fatty liver disease, nonalcoholic    FH: colonic polyps    hx of-adenomatous   GERD (gastroesophageal reflux disease)    Heart murmur    dx'd in 1960s. followed by PCP   Hemorrhoid .   Hyperlipidemia    Hypertension    IBS (irritable bowel syndrome)    Osteoporosis    Osteoporosis    Pancreatitis    drug induced   Past Surgical History:  Procedure Laterality Date   ABDOMINAL HYSTERECTOMY     Carotids  3/08   mild only   CATARACT EXTRACTION W/PHACO Right 06/29/2017   Procedure: CATARACT EXTRACTION PHACO AND INTRAOCULAR LENS PLACEMENT (IOC)  Toric Diabetic Right;  Surgeon: Nevada Crane, MD;  Location: [facility];  Service: Ophthalmology;  Laterality: Right;  diabetic - oral meds   CATARACT EXTRACTION W/PHACO Right 07/27/2017   Procedure: CATARACT EXTRACTION PHACO AND INTRAOCULAR LENS PLACEMENT (IOC) LEFT DIABETIC TORIC;  Surgeon: Nevada Crane, MD;  Location: [facility];  Service: Ophthalmology;  Laterality: Right;  Diabetic - oral meds   COLONOSCOPY      COLONOSCOPY WITH PROPOFOL N/A 07/20/2019   Procedure: COLONOSCOPY WITH PROPOFOL;  Surgeon: Toledo, Boykin Nearing, MD;  Location: [facility] ENDOSCOPY;  Service: Gastroenterology;  Laterality: N/A;   cystocele, cystourethropexy  12/87   dexa     increase T - 3.1 spine, normal femur 1/04   ESOPHAGOGASTRODUODENOSCOPY     ESOPHAGOGASTRODUODENOSCOPY (EGD) WITH PROPOFOL N/A 04/16/2018   Procedure: ESOPHAGOGASTRODUODENOSCOPY (EGD) WITH PROPOFOL;  Surgeon: Scot Jun, MD;  Location: [facility];  Service: Endoscopy;  Laterality: N/A;   ESOPHAGOGASTRODUODENOSCOPY (EGD) WITH PROPOFOL N/A 07/20/2019   Procedure: ESOPHAGOGASTRODUODENOSCOPY (EGD) WITH PROPOFOL;  Surgeon: Toledo, Boykin Nearing, MD;  Location: [facility] ENDOSCOPY;  Service: Gastroenterology;  Laterality: N/A;   EYE SURGERY     hysterectomy (other)  1980   IR CT HEAD LTD  08/31/2023   IR PERCUTANEOUS ART THROMBECTOMY/INFUSION INTRACRANIAL INC DIAG ANGIO  08/31/2023   IR US GUIDE VASC ACCESS RIGHT  08/31/2023   RADIOLOGY WITH ANESTHESIA N/A 08/31/2023   Procedure: IR WITH ANESTHESIA;  Surgeon: Radiologist, Medication, MD;  Location: [facility] OR;  Service: Radiology;  Laterality: N/A;   TUBAL LIGATION  1978   vaginal deliveries     x2   Family History  Problem Relation Age of Onset   Heart failure Mother    Coronary artery disease Mother    Heart attack Mother 74   Diabetes Mother    Heart failure Father    Coronary artery disease Father    Breast cancer Neg Hx    Social History:   reports that she quit smoking about 32 years ago. Her smoking use included cigarettes. She started smoking about 52 years ago. She has never used smokeless tobacco. She reports that she does not currently use alcohol. She reports that she does not use drugs.  Medications  Current Facility-Administered Medications:     stroke: early stages of recovery book, , Does not apply, Once, Toberman, Stevi W, NP   0.9 %  sodium chloride infusion, , Intravenous, Continuous, Toberman,  Stevi W, NP, Last Rate: 40 mL/hr at 09/01/23 0800, Infusion Verify at 09/01/23 0800   acetaminophen (TYLENOL) tablet 650 mg, 650 mg, Oral, Q4H PRN, 650 mg at 09/01/23 0256 **OR** acetaminophen (TYLENOL) 160 MG/5ML solution 650 mg, 650 mg, Per Tube, Q4H PRN **OR** acetaminophen (TYLENOL) suppository 650 mg, 650 mg, Rectal, Q4H PRN, de Melchor Amour, Jerilynn Mages, MD   Chlorhexidine Gluconate Cloth 2 % PADS 6 each, 6 each, Topical, Daily, Rejeana Brock, MD, 6 each at 08/31/23 2130   clevidipine (CLEVIPREX) infusion 0.5 mg/mL, 0-21 mg/hr, Intravenous, Continuous, de Melchor Amour, [place], MD, Stopped at 08/31/23 1838   insulin aspart (novoLOG) injection 0-15 Units, 0-15 Units, Subcutaneous, Q4H, Toberman, Stevi W, NP, 3 Units at 09/01/23 0458   Oral care mouth rinse, 15 mL, Mouth Rinse, 4 times per day, Rejeana Brock, MD, 15 mL at 08/31/23 2112   Oral care mouth rinse, 15 mL, Mouth Rinse, PRN, Rejeana Brock, MD   pantoprazole (PROTONIX) injection 40 mg, 40 mg, Intravenous, QHS, Toberman, Stevi W, NP, 40 mg at 08/31/23 2111   senna-docusate (Senokot-S) tablet 1 tablet, 1 tablet, Oral, QHS PRN, Toberman,  Reyne Dumas, NP   sodium chloride 0.9 % bolus 250 mL, 250 mL, Intravenous, PRN, de Melchor Amour, [place], MD  Exam: Current vital signs: BP (!) 139/52   Pulse 69   Temp 98.1 F (36.7 C) (Oral)   Resp 16   SpO2 97%  Vital signs in last 24 hours: Temp:  [97.5 F (36.4 C)-98.2 F (36.8 C)] 98.1 F (36.7 C) (10/22 0800) Pulse Rate:  [59-80] 69 (10/22 0800) Resp:  [9-30] 16 (10/22 0800) BP: (110-173)/(47-124) 139/52 (10/22 0800) SpO2:  [90 %-99 %] 97 % (10/22 0800) Weight:  [88.7 kg] 88.7 kg (10/21 1022)  GENERAL: Awake, alert, acutely ill appearing Caucasian female  Psych: Patient is calm during exam, she does not cooperate secondary to aphasia  Head: Normocephalic and atraumatic, without obvious abnormality EENT: Normal conjunctivae, dry mucous membranes, no  OP obstruction LUNGS: Normal respiratory effort. Non-labored breathing on room air CV: Regular rate and rhythm on telemetry ABDOMEN: Soft, non-tender, non-distended Extremities: Warm, well perfused, without obvious deformity  NEURO:  Mental Status: Patient is awake and alert on arrival.  She is globally aphasic. She does not answer orientation questions or follow commands.  She does not attempt to verbalize throughout assessment.  Cranial Nerves:  II: PERRL.  III, IV, VI: Left gaze preference noted, she will cross midline fully rightward briefly but has a clear left gaze preference.  V: Does not blink to threat on the right, blinks to threat on the left consistently.  VII: Face with r facial weakness VIII: Unable to assess patient's hearing  IX, X: Does not allow for visualization.  Does not phonate. XI: Head is turned leftward though will occasionally turn rightward throughout assessment  XII: Does not protrude tongue to command Motor: Flaccid right upper and lower extremity.  Patient will move the left upper and lower extremities spontaneously and antigravity.  Patient does not participate in confrontational strength testing. Tone is flaccid on the right. Bulk is normal.  Sensation: Grimaces to touch/passive extremity elevation throughout  Coordination: Does not perform Gait: Deferred in the setting for patient's safety  NIHSS: 1a Level of Conscious.: 0 1b LOC Questions: 2 1c LOC Commands: 2 2 Best Gaze: 1 3 Visual: 2 4 Facial Palsy: 2 5a Motor Arm - left: 1 5b Motor Arm - Right: 4 6a Motor Leg - Left: 1 6b Motor Leg - Right: 4 7 Limb Ataxia: 0 8 Sensory: 0 9 Best Language: 3 10 Dysarthria: 2 11 Extinct. and Inatten.: 0 TOTAL: 24  Labs I have reviewed labs in epic and the results pertinent to this consultation are: CBC    Component Value Date/Time   WBC 14.2 (H) 09/01/2023 0656   RBC 3.77 (L) 09/01/2023 0656   HGB 11.9 (L) 09/01/2023 0656   HCT 35.4 (L) 09/01/2023  0656   PLT 222 09/01/2023 0656   MCV 93.9 09/01/2023 0656   MCH 31.6 09/01/2023 0656   MCHC 33.6 09/01/2023 0656   RDW 13.9 09/01/2023 0656   LYMPHSABS 2.3 08/31/2023 0930   MONOABS 0.6 08/31/2023 0930   EOSABS 0.2 08/31/2023 0930   BASOSABS 0.0 08/31/2023 0930   CMP     Component Value Date/Time   NA 140 09/01/2023 0656   NA 143 02/10/2023 1503   K 3.7 09/01/2023 0656   CL 105 09/01/2023 0656   CO2 24 09/01/2023 0656   GLUCOSE 131 (H) 09/01/2023 0656   BUN 11 09/01/2023 0656   BUN 20 02/10/2023 1503   CREATININE 0.72 09/01/2023 0656  CALCIUM 8.3 (L) 09/01/2023 0656   PROT 5.5 (L) 09/01/2023 0656   PROT 7.2 02/10/2023 1503   ALBUMIN 3.1 (L) 09/01/2023 0656   ALBUMIN 4.9 (H) 02/10/2023 1503   AST 23 09/01/2023 0656   ALT 23 09/01/2023 0656   ALKPHOS 25 (L) 09/01/2023 0656   BILITOT 0.6 09/01/2023 0656   BILITOT 0.5 02/10/2023 1503   GFRNONAA >60 09/01/2023 0656   GFRAA >60 08/11/2019 0326   Lipid Panel     Component Value Date/Time   CHOL 133 09/01/2023 0656   TRIG 89 09/01/2023 0656   HDL 52 09/01/2023 0656   CHOLHDL 2.6 09/01/2023 0656   VLDL 18 09/01/2023 0656   LDLCALC 63 09/01/2023 0656   Lab Results  Component Value Date   HGBA1C 6.7 (H) 08/31/2023   Drugs of Abuse  No results found for: "LABOPIA", "COCAINSCRNUR", "LABBENZ", "AMPHETMU", "THCU", "LABBARB"   Imaging I have reviewed the images obtained:  CT head (initial 9:35): No hemorrhage or CT evidence of acute cortical infarct.  Chronic left frontal lobe infarct.  Dense calcification in the proximal left M1.  No disproportionately hyperdense vessel disease otherwise.  CT angio head and neck (9:50): Severe nearly occlusive stenosis of the proximal left M1 segment in the region of the dense calcification seen on same-day CT head.  There is flow distal to the site of stenosis.  While this could represent chronic severe stenosis secondary to calcified atherosclerotic plaque, a component of superimposed  acute on chronic thrombus is not entirely excluded.  The left vertebral artery terminates in a PICA. The left PICA appears severely stenosed to nearly occluded proximally.  No hemodynamically significant stenosis in the neck.  Repeat CTA head and neck (11:20): The distal left M1 segment/proximal superior and inferior M2 segments are now occluded for short segment with diminished collateral flow throughout the distal MCA territory.  No evidence of embolic infarct in the left MCA distribution or intracranial hemorrhage.  Assessment: 82 year old female with PMHx of type 2 diabetes mellitus, nonalcoholic fatty liver disease, GERD, HTN, HLD, and nonspecific cardiac arrhythmia who initially presented to [facility] ED for evaluation of acute onset of right-sided weakness, slurred speech, right facial droop following eating breakfast this morning at 08:30.  Patient was given TNKase with initial improvement in her exam followed by acute worsening of her neurologic status prompting further neuroimaging.  Repeat imaging findings obtained were concerning for acute distal left M1 segment/proximal superior and inferior M2 segment occlusions for a short-segment with diminished collateral flow throughout the distal MCA territory send patient was transferred to [facility] for IR thrombectomy.  Plan: Acute Ischemic Stroke Cerebral infarction due to occlusion versus severe stenosis of left middle cerebral artery   Acuity: Acute Current Suspected Etiology: Occlusion versus stenosis  Continue Evaluation:  -Admit to: ICU -Hold Aspirin until 24 hour post tPA neuroimaging is stable and without evidence of bleeding -Blood pressure control, SBP goal 120-160 per IR -MRI/ECHO/A1C/Lipid panel. -Hyperglycemia management per SSI to maintain glucose 140-180mg /dL. -PT/OT/ST therapies and recommendations when able  CNS Acute ischemic stroke -Close neuro monitoring -STAT CTH with neurologic decline  Dysarthria Dysphagia following  cerebral infarction  -NPO until cleared by speech -ST -Advance diet as tolerated  Hemiplegia and hemiparesis following cerebral infarction affecting right dominant side -PT/OT  RESP Intubated for IR procedure -vent management per ICU if patient remains intubated post-procedure -Extubate when able -supplemental oxygen for SpO2 > 92%  CV Essential (primary) hypertension Hypertensive Emergency -Aggressive BP control, goal SBP 120-160 per  IR -Labetalol PRN followed by Cardene gtt -Home meds held on admission: lisinopril, Lopressor   Hyperlipidemia, unspecified  - Statin for goal LDL < 70  HEME AM CBC  -Monitor H&H -Transfuse for hgb < 7 -Strict bedrest for 24 hours post-TNK  ENDO Type 2 diabetes mellitus with hyperglycemia  -SSI -goal HgbA1c < 7% -Glycemic protocol initiated  -Home meds held on admission: metformin, glipizide, Lantus -Consider inpatient diabetes coordinator assistance with glucose management as needed  GI/GU -Gentle hydration while NPO, transition to PO when able -Bedside swallow prior to PO  Fluid/Electrolyte Disorders AM CMP -Replete as needed -Repeat labs as needed -Trend  ID Trend fever and WBC -UA pending  -AM CBC  Nutrition E66.9 Obesity   Prophylaxis DVT:  SCDs GI: PPI Bowel: Docusate / Senna  Diet: NPO until cleared by speech  Code Status: Full Code    THE FOLLOWING WERE PRESENT ON ADMISSION: CNS -  Acute Ischemic Stroke, Hemiparesis Cardiovascular - Cardiac Arrhythmia, unspecified Heme-  Coagulopathy secondary to TNK at outside hospital  [facility], AGAC-NP [facility] Pager: [phone]  I was present for the entirety of the evaluation and management reflected in the above note and plan and assessment were jointly formulated.  She is going for emergent thrombectomy and will need to be closely monitored in the ICU following this.  Of note, though narcotics are listed on her home medications, she has  not taken these in some time.  This patient is critically ill and at significant risk of neurological worsening, death and care requires constant monitoring of vital signs, hemodynamics,respiratory and cardiac monitoring, neurological assessment, discussion with family, other specialists and medical decision making of high complexity. I spent 35 minutes of neurocritical care time  in the care of  this patient. This was time spent independent of any time provided by nurse practitioner or PA.  Ritta Slot, MD [facility] [phone]  If 7pm- 7am, please page neurology on call as listed in AMION. 09/01/2023  9:04 AM

## 2023-09-01 NOTE — Progress Notes (Signed)
*  PRELIMINARY RESULTS* Echocardiogram 2D Echocardiogram has been performed.  Molly [name] 09/01/2023, 1:52 PM

## 2023-09-01 NOTE — Progress Notes (Signed)
Notified Dr. Derry Lory of neuro change at 0125.  NIHSS increased from 3 to 8.  Patient only answer name.  Pt A&O x 4 previously.  Pt not talking or moving RUE against gravity.  MD notified, STAT CT head and angio ordered.

## 2023-09-01 NOTE — Progress Notes (Signed)
OT Cancellation Note  Patient Details Name: Molly [name] MRN: [id number] DOB: Mar 12, 1941   Cancelled Treatment:    Reason Eval/Treat Not Completed: Active bedrest order (stat CT with increased NIH noted in chart. Pt with active bedrest orders)  Mateo Flow 09/01/2023, 7:43 AM

## 2023-09-01 NOTE — Progress Notes (Signed)
PT Cancellation Note  Patient Details Name: Molly [name] MRN: [id number] DOB: 07-May-1941   Cancelled Treatment:    Reason Eval/Treat Not Completed: Patient not medically ready (stat CT with increased NIH noted in chart. Pt with active bedrest orders)   Renaldo Fiddler PT, DPT Acute Rehabilitation Services Office [phone]  09/01/23 11:21 AM

## 2023-09-01 NOTE — Progress Notes (Signed)
Pt aphasia worsened slightly.  RUE moves against gravity but is not resistant.  MD notified, 500cc bolus of 0.9% NS ordered.

## 2023-09-01 NOTE — Evaluation (Addendum)
Physical Therapy Evaluation Patient Details Name: Molly [name] MRN: [id number] DOB: 1941-02-04 Today's Date: 09/01/2023  History of Present Illness  82 yo female presenting with R side weakness and aphasia. Imaging + for L M1 MCA occlusion s/p TNK at [facility] with IR thrombectomy successful. PMH DM2, nonalcoholic fatty liver disease, GERD HTN HLD nonspecific cardiac arrhythmia L frontal lobe infarct.   Clinical Impression  Molly [name] is 82 y.o. female admitted with above HPI and diagnosis. Patient is currently limited by functional impairments below (see PT problem list). Patient lives alone and is independent with mobility at baseline. Currently pt limited by weakness, impaired balance, impaired cognition with decreased processing, planning, and initiation. Currently she requires max assist for bed mobility to roll and move supine<>sit EOB. Limited to dangle EOB due to RN arrival with news of stat CT order. Pt returned to supine and lines exchanged due to soiled from urine. Notified RN of red spot at base of sacrum and fresh mepilex placed and barrier cream applied to bottom. Patient will benefit from continued skilled PT interventions to address impairments and progress independence with mobility, recommending. Patient will benefit from intensive inpatient follow up therapy, >3 hours/day. Acute PT will follow and progress as `able.         If plan is discharge home, recommend the following: Two people to help with walking and/or transfers;Two people to help with bathing/dressing/bathroom;Assistance with cooking/housework;Direct supervision/assist for medications management;Assist for transportation;Supervision due to cognitive status;Help with stairs or ramp for entrance   Can travel by private vehicle        Equipment Recommendations  (TBD at next venue)  Recommendations for Other Services  Rehab consult    Functional Status Assessment Patient has had a recent decline in their functional  status and demonstrates the ability to make significant improvements in function in a reasonable and predictable amount of time.     Precautions / Restrictions Precautions Precautions: Fall Precaution Comments: SBP 120-160 Restrictions Weight Bearing Restrictions: No      Mobility  Bed Mobility Overal bed mobility: Needs Assistance Bed Mobility: Supine to Sit, Sit to Supine, Rolling Rolling: Mod assist, Used rails   Supine to sit: Max assist, Used rails, HOB elevated Sit to supine: Max assist, +2 for safety/equipment   General bed mobility comments: Mod-max assist for roll Rt/Lt with hand over hand placement to initiate reach to bed rail and manual facilitation of flexing knees to roll. Max assist to bring LE's off EOB and raise trunk upright, pt initially pulling on bed rail and preventing trunk rise, cues to release rail needed. Max+2 for safe lower/return to supine.    Transfers                        Ambulation/Gait                  Stairs            Wheelchair Mobility     Tilt Bed    Modified Rankin (Stroke Patients Only)       Balance Overall balance assessment: Needs assistance Sitting-balance support: Feet supported, Bilateral upper extremity supported Sitting balance-Leahy Scale: Fair                                       Pertinent Vitals/Pain Pain Assessment Pain Assessment: No/denies pain    Home Living  Family/patient expects to be discharged to:: Private residence Living Arrangements: Spouse/significant other Available Help at Discharge: Family;Available 24 hours/day Type of Home: House Home Access: Stairs to enter Entrance Stairs-Rails: Doctor, general practice of Steps: 3   Home Layout: One level Home Equipment: Shower seat;Grab bars - toilet;Grab bars - tub/shower;Hand held Programmer, systems (2 wheels);Cane - single point      Prior Function Prior Level of Function :  Independent/Modified Independent;Driving             Mobility Comments: 1 falls on vacation ~2 months ago ADLs Comments: driving when she wanted to, spouse food shops,     Extremity/Trunk Assessment   Upper Extremity Assessment Upper Extremity Assessment: Defer to OT evaluation    Lower Extremity Assessment Lower Extremity Assessment: Generalized weakness;RLE deficits/detail;LLE deficits/detail (testing limited due to return to supine as RN arrived informing new order for stat CT) RLE Deficits / Details: some tone noted in Rt LE however limited time to test due to Rn arriving for stat CT RLE Sensation: decreased light touch (inconsistent) RLE Coordination: decreased gross motor    Cervical / Trunk Assessment Cervical / Trunk Assessment: Kyphotic  Communication   Communication Communication: Difficulty following commands/understanding Following commands: Follows one step commands inconsistently;Follows one step commands with increased time Cueing Techniques: Verbal cues;Tactile cues;Visual cues  Cognition Arousal: Alert Behavior During Therapy: WFL for tasks assessed/performed Overall Cognitive Status: Impaired/Different from baseline Area of Impairment: Memory, Following commands, Problem solving                     Memory: Decreased short-term memory Following Commands: Follows one step commands inconsistently, Follows one step commands with increased time     Problem Solving: Slow processing, Decreased initiation, Requires verbal cues, Difficulty sequencing, Requires tactile cues          General Comments      Exercises     Assessment/Plan    PT Assessment Patient needs continued PT services  PT Problem List Decreased strength;Impaired sensation;Cardiopulmonary status limiting activity;Decreased knowledge of precautions;Decreased safety awareness;Decreased knowledge of use of DME;Decreased cognition;Decreased coordination;Decreased mobility;Decreased  balance;Decreased activity tolerance;Decreased range of motion;Impaired tone       PT Treatment Interventions DME instruction;Gait training;Stair training;Functional mobility training;Therapeutic activities;Therapeutic exercise;Balance training;Neuromuscular re-education;Cognitive remediation;Patient/family education;Wheelchair mobility training;Manual techniques;Modalities    PT Goals (Current goals can be found in the Care Plan section)  Acute Rehab PT Goals Patient Stated Goal: regain function/independence and mobility PT Goal Formulation: With patient/family Time For Goal Achievement: 09/15/23 Potential to Achieve Goals: Good    Frequency Min 1X/week     Co-evaluation               AM-PAC PT "6 Clicks" Mobility  Outcome Measure Help needed turning from your back to your side while in a flat bed without using bedrails?: A Lot Help needed moving from lying on your back to sitting on the side of a flat bed without using bedrails?: Total Help needed moving to and from a bed to a chair (including a wheelchair)?: Total Help needed standing up from a chair using your arms (e.g., wheelchair or bedside chair)?: Total Help needed to walk in hospital room?: Total Help needed climbing 3-5 steps with a railing? : Total 6 Click Score: 7    End of Session   Activity Tolerance: Patient tolerated treatment well Patient left: in bed;with call bell/phone within reach;with nursing/sitter in room;with family/visitor present Nurse Communication: Mobility status PT Visit Diagnosis: Unsteadiness on feet (  R26.81);Other abnormalities of gait and mobility (R26.89);Muscle weakness (generalized) (M62.81);Difficulty in walking, not elsewhere classified (R26.2);Other symptoms and signs involving the nervous system (R29.898)    Time: 8413-2440 PT Time Calculation (min) (ACUTE ONLY): 25 min   Charges:   PT Evaluation $PT Eval Moderate Complexity: 1 Mod   PT General Charges $$ ACUTE PT VISIT: 1  Visit         Wynn Maudlin, DPT Acute Rehabilitation Services Office [phone]  09/01/23 4:15 PM

## 2023-09-01 NOTE — Progress Notes (Signed)
Notified of increase in NIHSS from  3 to 8 with worsening aphasia, no effort against gravity in RUE. Will get STAT CT head and CTA.  Erick Blinks [facility]

## 2023-09-01 NOTE — Progress Notes (Signed)
SLP Cancellation Note  Patient Details Name: XELA ANTOUN MRN: [id number] DOB: 11-13-1940   Cancelled treatment:       Reason Eval/Treat Not Completed: Medical issues which prohibited therapy- neuro changes with new imaging ordered. Will hold on aphasia eval until tomorrow.  Jerrilyn Messinger L. Samson Frederic, MA CCC/SLP Clinical Specialist - Acute Care SLP Acute Rehabilitation Services Office number [phone]    Blenda Mounts Laurice 09/01/2023, 11:20 AM

## 2023-09-01 NOTE — Progress Notes (Addendum)
STROKE TEAM PROGRESS NOTE   BRIEF HPI Molly [name] is a 82 y.o. female with history of type 2 diabetes, nonalcoholic fatty liver disease, GERD, hypertension, hyperlipidemia and nonspecific cardiac arrhythmia presenting with acute onset right-sided weakness and aphasia.  She was found to have a left M1 MCA occlusion.  She was given TNK at [facility] and transferred here for mechanical thrombectomy.  This was successful with TICI 2B recanalization achieved.   SIGNIFICANT HOSPITAL EVENTS 10/21: TNK given and mechanical thrombectomy performed with TICI 2B recanalization  INTERIM HISTORY/SUBJECTIVE Patient has been hemodynamically stable and afebrile overnight.  Some expressive aphasia remains with patient able to speak in single words and short phrases.  Left-sided weakness has improved but is still present.  OBJECTIVE  CBC    Component Value Date/Time   WBC 14.2 (H) 09/01/2023 0656   RBC 3.77 (L) 09/01/2023 0656   HGB 11.9 (L) 09/01/2023 0656   HCT 35.4 (L) 09/01/2023 0656   PLT 222 09/01/2023 0656   MCV 93.9 09/01/2023 0656   MCH 31.6 09/01/2023 0656   MCHC 33.6 09/01/2023 0656   RDW 13.9 09/01/2023 0656   LYMPHSABS 2.3 08/31/2023 0930   MONOABS 0.6 08/31/2023 0930   EOSABS 0.2 08/31/2023 0930   BASOSABS 0.0 08/31/2023 0930    BMET    Component Value Date/Time   NA 140 09/01/2023 0656   NA 143 02/10/2023 1503   K 3.7 09/01/2023 0656   CL 105 09/01/2023 0656   CO2 24 09/01/2023 0656   GLUCOSE 131 (H) 09/01/2023 0656   BUN 11 09/01/2023 0656   BUN 20 02/10/2023 1503   CREATININE 0.72 09/01/2023 0656   CALCIUM 8.3 (L) 09/01/2023 0656   EGFR 67 02/10/2023 1503   GFRNONAA >60 09/01/2023 0656    IMAGING past 24 hours CT HEAD WO CONTRAST ( )  Result Date: 09/01/2023 CLINICAL DATA:  Right-sided deficits. Status post left MCA thrombectomy. EXAM: CT HEAD WITHOUT CONTRAST CT ANGIOGRAPHY OF THE HEAD AND NECK TECHNIQUE: Contiguous axial images were obtained from the base of the  skull through the vertex without intravenous contrast. Multidetector CT imaging of the head and neck was performed using the standard protocol during bolus administration of intravenous contrast. Multiplanar CT image reconstructions and MIPs were obtained to evaluate the vascular anatomy. Carotid stenosis measurements (when applicable) are obtained utilizing NASCET criteria, using the distal internal carotid diameter as the denominator. RADIATION DOSE REDUCTION: This exam was performed according to the departmental dose-optimization program which includes automated exposure control, adjustment of the mA and/or kV according to patient size and/or use of iterative reconstruction technique. CONTRAST:  75mL OMNIPAQUE IOHEXOL 350 MG/ML SOLN COMPARISON:  08/31/2023 FINDINGS: CT HEAD FINDINGS Brain: There is no mass, hemorrhage or extra-axial collection. The size and configuration of the ventricles and extra-axial CSF spaces are normal. There is hypoattenuation of the white matter, most commonly indicating chronic small vessel disease. Skull: The visualized skull base, calvarium and extracranial soft tissues are normal. Sinuses/Orbits: No fluid levels or advanced mucosal thickening of the visualized paranasal sinuses. No mastoid or middle ear effusion. Normal orbits. CTA NECK FINDINGS SKELETON: No acute abnormality or high grade bony spinal canal stenosis. OTHER NECK: Normal pharynx, larynx and major salivary glands. No cervical lymphadenopathy. Unremarkable thyroid gland. UPPER CHEST: No pneumothorax or pleural effusion. No nodules or masses. AORTIC ARCH: There is calcific atherosclerosis of the aortic arch. Conventional 3 vessel aortic branching pattern. RIGHT CAROTID SYSTEM: No dissection, occlusion or aneurysm. Mild atherosclerotic calcification at the carotid  bifurcation without hemodynamically significant stenosis. LEFT CAROTID SYSTEM: No dissection, occlusion or aneurysm. Mild atherosclerotic calcification at the  carotid bifurcation without hemodynamically significant stenosis. VERTEBRAL ARTERIES: Right dominant configuration. There is no dissection, occlusion or flow-limiting stenosis to the skull base (V1-V3 segments). CTA HEAD FINDINGS POSTERIOR CIRCULATION: --Vertebral arteries: Normal right V4 segment. Left vertebral artery terminates in PICA and may be occluded proximally, unchanged from the earlier study. --Basilar artery: Normal. --Superior cerebellar arteries: Normal. --Posterior cerebral arteries (PCA): Normal. ANTERIOR CIRCULATION: --Intracranial internal carotid arteries: Atherosclerotic calcification of the internal carotid arteries at the skull base without hemodynamically significant stenosis. --Anterior cerebral arteries (ACA): Normal. --Middle cerebral arteries (MCA): Normal. VENOUS SINUSES: As permitted by contrast timing, patent. ANATOMIC VARIANTS: None Review of the MIP images confirms the above findings. IMPRESSION: 1. No intracranial arterial occlusion or high-grade stenosis. The left middle cerebral artery is now patent. 2. Unchanged appearance of the left vertebral artery which terminates in PICA and may be occluded proximally. Aortic Atherosclerosis (ICD10-I70.0). Electronically Signed   By: Deatra Robinson M.D.   On: 09/01/2023 03:00   CT ANGIO HEAD NECK W WO CM  Result Date: 09/01/2023 CLINICAL DATA:  Right-sided deficits. Status post left MCA thrombectomy. EXAM: CT HEAD WITHOUT CONTRAST CT ANGIOGRAPHY OF THE HEAD AND NECK TECHNIQUE: Contiguous axial images were obtained from the base of the skull through the vertex without intravenous contrast. Multidetector CT imaging of the head and neck was performed using the standard protocol during bolus administration of intravenous contrast. Multiplanar CT image reconstructions and MIPs were obtained to evaluate the vascular anatomy. Carotid stenosis measurements (when applicable) are obtained utilizing NASCET criteria, using the distal internal carotid  diameter as the denominator. RADIATION DOSE REDUCTION: This exam was performed according to the departmental dose-optimization program which includes automated exposure control, adjustment of the mA and/or kV according to patient size and/or use of iterative reconstruction technique. CONTRAST:  75mL OMNIPAQUE IOHEXOL 350 MG/ML SOLN COMPARISON:  08/31/2023 FINDINGS: CT HEAD FINDINGS Brain: There is no mass, hemorrhage or extra-axial collection. The size and configuration of the ventricles and extra-axial CSF spaces are normal. There is hypoattenuation of the white matter, most commonly indicating chronic small vessel disease. Skull: The visualized skull base, calvarium and extracranial soft tissues are normal. Sinuses/Orbits: No fluid levels or advanced mucosal thickening of the visualized paranasal sinuses. No mastoid or middle ear effusion. Normal orbits. CTA NECK FINDINGS SKELETON: No acute abnormality or high grade bony spinal canal stenosis. OTHER NECK: Normal pharynx, larynx and major salivary glands. No cervical lymphadenopathy. Unremarkable thyroid gland. UPPER CHEST: No pneumothorax or pleural effusion. No nodules or masses. AORTIC ARCH: There is calcific atherosclerosis of the aortic arch. Conventional 3 vessel aortic branching pattern. RIGHT CAROTID SYSTEM: No dissection, occlusion or aneurysm. Mild atherosclerotic calcification at the carotid bifurcation without hemodynamically significant stenosis. LEFT CAROTID SYSTEM: No dissection, occlusion or aneurysm. Mild atherosclerotic calcification at the carotid bifurcation without hemodynamically significant stenosis. VERTEBRAL ARTERIES: Right dominant configuration. There is no dissection, occlusion or flow-limiting stenosis to the skull base (V1-V3 segments). CTA HEAD FINDINGS POSTERIOR CIRCULATION: --Vertebral arteries: Normal right V4 segment. Left vertebral artery terminates in PICA and may be occluded proximally, unchanged from the earlier study.  --Basilar artery: Normal. --Superior cerebellar arteries: Normal. --Posterior cerebral arteries (PCA): Normal. ANTERIOR CIRCULATION: --Intracranial internal carotid arteries: Atherosclerotic calcification of the internal carotid arteries at the skull base without hemodynamically significant stenosis. --Anterior cerebral arteries (ACA): Normal. --Middle cerebral arteries (MCA): Normal. VENOUS SINUSES: As permitted by contrast timing,  patent. ANATOMIC VARIANTS: None Review of the MIP images confirms the above findings. IMPRESSION: 1. No intracranial arterial occlusion or high-grade stenosis. The left middle cerebral artery is now patent. 2. Unchanged appearance of the left vertebral artery which terminates in PICA and may be occluded proximally. Aortic Atherosclerosis (ICD10-I70.0). Electronically Signed   By: Deatra Robinson M.D.   On: 09/01/2023 03:00   IR PERCUTANEOUS ART THROMBECTOMY/INFUSION INTRACRANIAL INC DIAG ANGIO  Result Date: 08/31/2023 INDICATION: 82 year old female with past medical history significant for hypertension and hyperlipidemia who presented to [facility] after acute onset of right sided weakness and dysarthria; NIHSS 7. Her last known well was 8:30 a.m. Head CT showed no acute process. She received IV TNK at 0958. CT angiogram of the head and neck showed severe left M1/MCA stenosis without acute occlusion. Her neurological exam improved after TNK (NIHSS = 4) and it was felt that no intervention was indicated at that time. However, at 11 a.m., she showed a significant neurological decline with significantly worse aphasia and worse right-sided weakness; NIHSS = 18. CT head showed no evidence of hemorrhagic conversion but CT angiogram of the head and neck showed new acute L M1 occlusion. She was then transferred to our service for mechanical thrombectomy. EXAM: ULTRASOUND-GUIDED VASCULAR ACCESS DIAGNOSTIC CEREBRAL ANGIOGRAM MECHANICAL THROMBECTOMY FLAT PANEL HEAD CT  COMPARISON:  CT/CT angiogram of the head and neck August 31, 2023. MEDICATIONS: No antibiotics administered. ANESTHESIA/SEDATION: The procedure was performed under general anesthesia. CONTRAST:  75 mL of Omnipaque 200 milligram/mL. FLUOROSCOPY: Radiation Exposure Index (as provided by the fluoroscopic device): 1051 mGy Kerma COMPLICATIONS: None immediate. TECHNIQUE: Informed written consent was obtained from the patient's husband after a thorough discussion of the procedural risks, benefits and alternatives. All questions were addressed. Maximal Sterile Barrier Technique was utilized including caps, mask, sterile gowns, sterile gloves, sterile drape, hand hygiene and skin antiseptic. A timeout was performed prior to the initiation of the procedure. The right groin was prepped and draped in the usual sterile fashion. Using a micropuncture kit and the modified Seldinger technique, access was gained to the right common femoral artery and an 8 French sheath was placed. Real-time ultrasound guidance was utilized for vascular access including the acquisition of a permanent ultrasound image documenting patency of the accessed vessel. Under fluoroscopy, a Zoom 88 guide catheter was navigated over a 6 [place] Berenstein 2 catheter and a 0.035" Terumo Glidewire into the aortic arch. The catheter was placed into the left common carotid artery and then advanced into the left internal carotid artery. The diagnostic catheter was removed. Frontal and lateral angiograms of the head were obtained. FINDINGS: 1. Normal caliber of the right common femoral artery, adequate for vascular access. 2. Since prior CT angiogram, there has been clot fragmentation with subocclusive clot in the left M1/MCA extending into and occluding the M2/MCA superior division branch. 3. There is subocclusive clot in a distal left M2/MCA inferior division branch as well as multiple clots scattered throughout distal branches of the M2/MCA inferior division. 4.  Nonocclusive clot in a distal left callosomarginal artery branch with slow anterograde flow and brisk retrograde opacification from left MCA collaterals. PROCEDURE: Using biplane roadmap guidance, a FreeClimb 70 aspiration catheter was navigated over Tenzing delivery catheter and an Aristotle 14 microguidewire into the cavernous segment of the left ICA. The aspiration catheter was then advanced to the level of occlusion in the proximal left M2/MCA superior division branch and connected to an aspiration pump. Continuous aspiration was performed for  2 minutes. The guide catheter was connected to a VacLok syringe. The aspiration catheter was subsequently removed under constant aspiration. The guide catheter was aspirated for debris. Left internal carotid artery angiograms with frontal and lateral views of the head showed recanalization of the left M2/MCA superior division branch. There is also resolution of the filling defect within the M1 segment. However there is distal embolization to a distal M2/MCA posterior division branch. Using biplane roadmap guidance, a Red 43 aspiration catheter was navigated over an Aristotle 14 microguidewire into the cavernous segment of the left ICA. The aspiration catheter was then advanced to the level of occlusion in the distal M2 segment and connected to an aspiration pump. Continuous aspiration was performed for 2 minutes. The guide catheter was connected to a VacLok syringe. The aspiration catheter was subsequently removed under constant aspiration. The guide catheter was aspirated for debris. Left internal carotid artery angiograms with frontal and lateral views of the head showed recanalization of the M2 segment with distal embolus show and M3 segment. Persistent filling defect in a separate M2 branch with slow anterograde flow. Using biplane roadmap guidance, a Socrate 38 aspiration catheter was navigated over an Aristotle 14 microguidewire into the cavernous segment of the left  ICA. The aspiration catheter was then advanced to the level of occlusion in the distal M2 segment and connected to an aspiration pump. Continuous aspiration was performed for 2 minutes. The guide catheter was connected to a VacLok syringe. The aspiration catheter was subsequently removed under constant aspiration. The guide catheter was aspirated for debris. Left internal carotid artery angiograms with frontal lateral views of the head showed recanalization of the proximal M3 segment with further embolization into the M4 segment. Using biplane roadmap guidance, a Red 43 aspiration catheter was navigated over an Aristotle 14 microguidewire into the cavernous segment of the left ICA. The aspiration catheter was then advanced to the level of occlusion in a different distal M2 segment and connected to an aspiration pump. Continuous aspiration was performed for 2 minutes. The guide catheter was connected to a VacLok syringe. The aspiration catheter was subsequently removed under constant aspiration. The guide catheter was aspirated for debris. Left internal carotid artery angiograms showed recanalization of the M2 segment. Persistent filling defect within the a left M4/MCA branch to the posterior parietal region and to the posterior temporal region. Using biplane roadmap guidance, a Red 43 aspiration catheter was navigated over an Aristotle 14 microguidewire into the cavernous segment of the left ICA. The wire was then advanced to the level of the filling defect in the M4 segment to the posterior parietal region. The aspiration catheter was not advanced to that level. Frontal and lateral angiograms of the head showed slow flow to the M4 branches the posterior parietal region with persistent occlusion of the branch to the posterior temporal region. Flat panel CT of the head was obtained and post processed in a separate workstation with concurrent attending physician supervision. Selected images were sent to PACS.  Follow-up left internal carotid artery angiograms with frontal lateral views of the head showed stable findings in the left MCA vascular tree, as well as left ACA with brisk retrograde flow of a distal branch of the left callosum marginal artery via leptomeningeal collaterals. The catheter was retracted into the left common carotid artery. Contrast injection showed no evidence of stenosis or filling defect in the cervical left ICA. The catheter was subsequently withdrawn. Right common femoral artery angiogram was obtained in right anterior oblique view.  The puncture is at the level of the common femoral artery. The artery has normal caliber, adequate for closure device. The sheath was exchanged over the wire for a Perclose prostyle which was utilized for access closure. Immediate hemostasis was achieved. IMPRESSION: 1. Mechanical thrombectomy performed for treatment of subocclusive left M1/MCA filling defect, a occlusion of the proximal left M2/MCA superior division branch, distal left M2/MCA posterior division branch achieving adequate recanalization (TICI 2B). Persistent slow flow in a posterior parietal cortical branch and persistent occlusion of a posterior temporal cortical branch seen on final angiogram. 2. Nonocclusive clot in a distal left callosomarginal artery branch with slow anterograde flow and brisk retrograde opacification from left MCA leptomeningeal collaterals. PLAN: ICU level of care. Electronically Signed   By: Baldemar Lenis M.D.   On: 08/31/2023 15:55   IR US Guide Vasc Access Right  Result Date: 08/31/2023 INDICATION: 82 year old female with past medical history significant for hypertension and hyperlipidemia who presented to [facility] after acute onset of right sided weakness and dysarthria; NIHSS 7. Her last known well was 8:30 a.m. Head CT showed no acute process. She received IV TNK at 0958. CT angiogram of the head and neck showed severe left  M1/MCA stenosis without acute occlusion. Her neurological exam improved after TNK (NIHSS = 4) and it was felt that no intervention was indicated at that time. However, at 11 a.m., she showed a significant neurological decline with significantly worse aphasia and worse right-sided weakness; NIHSS = 18. CT head showed no evidence of hemorrhagic conversion but CT angiogram of the head and neck showed new acute L M1 occlusion. She was then transferred to our service for mechanical thrombectomy. EXAM: ULTRASOUND-GUIDED VASCULAR ACCESS DIAGNOSTIC CEREBRAL ANGIOGRAM MECHANICAL THROMBECTOMY FLAT PANEL HEAD CT COMPARISON:  CT/CT angiogram of the head and neck August 31, 2023. MEDICATIONS: No antibiotics administered. ANESTHESIA/SEDATION: The procedure was performed under general anesthesia. CONTRAST:  75 mL of Omnipaque 200 milligram/mL. FLUOROSCOPY: Radiation Exposure Index (as provided by the fluoroscopic device): 1051 mGy Kerma COMPLICATIONS: None immediate. TECHNIQUE: Informed written consent was obtained from the patient's husband after a thorough discussion of the procedural risks, benefits and alternatives. All questions were addressed. Maximal Sterile Barrier Technique was utilized including caps, mask, sterile gowns, sterile gloves, sterile drape, hand hygiene and skin antiseptic. A timeout was performed prior to the initiation of the procedure. The right groin was prepped and draped in the usual sterile fashion. Using a micropuncture kit and the modified Seldinger technique, access was gained to the right common femoral artery and an 8 French sheath was placed. Real-time ultrasound guidance was utilized for vascular access including the acquisition of a permanent ultrasound image documenting patency of the accessed vessel. Under fluoroscopy, a Zoom 88 guide catheter was navigated over a 6 [place] Berenstein 2 catheter and a 0.035" Terumo Glidewire into the aortic arch. The catheter was placed into the left common  carotid artery and then advanced into the left internal carotid artery. The diagnostic catheter was removed. Frontal and lateral angiograms of the head were obtained. FINDINGS: 1. Normal caliber of the right common femoral artery, adequate for vascular access. 2. Since prior CT angiogram, there has been clot fragmentation with subocclusive clot in the left M1/MCA extending into and occluding the M2/MCA superior division branch. 3. There is subocclusive clot in a distal left M2/MCA inferior division branch as well as multiple clots scattered throughout distal branches of the M2/MCA inferior division. 4. Nonocclusive clot in a distal  left callosomarginal artery branch with slow anterograde flow and brisk retrograde opacification from left MCA collaterals. PROCEDURE: Using biplane roadmap guidance, a FreeClimb 70 aspiration catheter was navigated over Tenzing delivery catheter and an Aristotle 14 microguidewire into the cavernous segment of the left ICA. The aspiration catheter was then advanced to the level of occlusion in the proximal left M2/MCA superior division branch and connected to an aspiration pump. Continuous aspiration was performed for 2 minutes. The guide catheter was connected to a VacLok syringe. The aspiration catheter was subsequently removed under constant aspiration. The guide catheter was aspirated for debris. Left internal carotid artery angiograms with frontal and lateral views of the head showed recanalization of the left M2/MCA superior division branch. There is also resolution of the filling defect within the M1 segment. However there is distal embolization to a distal M2/MCA posterior division branch. Using biplane roadmap guidance, a Red 43 aspiration catheter was navigated over an Aristotle 14 microguidewire into the cavernous segment of the left ICA. The aspiration catheter was then advanced to the level of occlusion in the distal M2 segment and connected to an aspiration pump. Continuous  aspiration was performed for 2 minutes. The guide catheter was connected to a VacLok syringe. The aspiration catheter was subsequently removed under constant aspiration. The guide catheter was aspirated for debris. Left internal carotid artery angiograms with frontal and lateral views of the head showed recanalization of the M2 segment with distal embolus show and M3 segment. Persistent filling defect in a separate M2 branch with slow anterograde flow. Using biplane roadmap guidance, a Socrate 38 aspiration catheter was navigated over an Aristotle 14 microguidewire into the cavernous segment of the left ICA. The aspiration catheter was then advanced to the level of occlusion in the distal M2 segment and connected to an aspiration pump. Continuous aspiration was performed for 2 minutes. The guide catheter was connected to a VacLok syringe. The aspiration catheter was subsequently removed under constant aspiration. The guide catheter was aspirated for debris. Left internal carotid artery angiograms with frontal lateral views of the head showed recanalization of the proximal M3 segment with further embolization into the M4 segment. Using biplane roadmap guidance, a Red 43 aspiration catheter was navigated over an Aristotle 14 microguidewire into the cavernous segment of the left ICA. The aspiration catheter was then advanced to the level of occlusion in a different distal M2 segment and connected to an aspiration pump. Continuous aspiration was performed for 2 minutes. The guide catheter was connected to a VacLok syringe. The aspiration catheter was subsequently removed under constant aspiration. The guide catheter was aspirated for debris. Left internal carotid artery angiograms showed recanalization of the M2 segment. Persistent filling defect within the a left M4/MCA branch to the posterior parietal region and to the posterior temporal region. Using biplane roadmap guidance, a Red 43 aspiration catheter was  navigated over an Aristotle 14 microguidewire into the cavernous segment of the left ICA. The wire was then advanced to the level of the filling defect in the M4 segment to the posterior parietal region. The aspiration catheter was not advanced to that level. Frontal and lateral angiograms of the head showed slow flow to the M4 branches the posterior parietal region with persistent occlusion of the branch to the posterior temporal region. Flat panel CT of the head was obtained and post processed in a separate workstation with concurrent attending physician supervision. Selected images were sent to PACS. Follow-up left internal carotid artery angiograms with frontal lateral views of  the head showed stable findings in the left MCA vascular tree, as well as left ACA with brisk retrograde flow of a distal branch of the left callosum marginal artery via leptomeningeal collaterals. The catheter was retracted into the left common carotid artery. Contrast injection showed no evidence of stenosis or filling defect in the cervical left ICA. The catheter was subsequently withdrawn. Right common femoral artery angiogram was obtained in right anterior oblique view. The puncture is at the level of the common femoral artery. The artery has normal caliber, adequate for closure device. The sheath was exchanged over the wire for a Perclose prostyle which was utilized for access closure. Immediate hemostasis was achieved. IMPRESSION: 1. Mechanical thrombectomy performed for treatment of subocclusive left M1/MCA filling defect, a occlusion of the proximal left M2/MCA superior division branch, distal left M2/MCA posterior division branch achieving adequate recanalization (TICI 2B). Persistent slow flow in a posterior parietal cortical branch and persistent occlusion of a posterior temporal cortical branch seen on final angiogram. 2. Nonocclusive clot in a distal left callosomarginal artery branch with slow anterograde flow and brisk  retrograde opacification from left MCA leptomeningeal collaterals. PLAN: ICU level of care. Electronically Signed   By: Baldemar Lenis M.D.   On: 08/31/2023 15:55   IR CT Head Ltd  Result Date: 08/31/2023 INDICATION: 82 year old female with past medical history significant for hypertension and hyperlipidemia who presented to [facility] after acute onset of right sided weakness and dysarthria; NIHSS 7. Her last known well was 8:30 a.m. Head CT showed no acute process. She received IV TNK at 0958. CT angiogram of the head and neck showed severe left M1/MCA stenosis without acute occlusion. Her neurological exam improved after TNK (NIHSS = 4) and it was felt that no intervention was indicated at that time. However, at 11 a.m., she showed a significant neurological decline with significantly worse aphasia and worse right-sided weakness; NIHSS = 18. CT head showed no evidence of hemorrhagic conversion but CT angiogram of the head and neck showed new acute L M1 occlusion. She was then transferred to our service for mechanical thrombectomy. EXAM: ULTRASOUND-GUIDED VASCULAR ACCESS DIAGNOSTIC CEREBRAL ANGIOGRAM MECHANICAL THROMBECTOMY FLAT PANEL HEAD CT COMPARISON:  CT/CT angiogram of the head and neck August 31, 2023. MEDICATIONS: No antibiotics administered. ANESTHESIA/SEDATION: The procedure was performed under general anesthesia. CONTRAST:  75 mL of Omnipaque 200 milligram/mL. FLUOROSCOPY: Radiation Exposure Index (as provided by the fluoroscopic device): 1051 mGy Kerma COMPLICATIONS: None immediate. TECHNIQUE: Informed written consent was obtained from the patient's husband after a thorough discussion of the procedural risks, benefits and alternatives. All questions were addressed. Maximal Sterile Barrier Technique was utilized including caps, mask, sterile gowns, sterile gloves, sterile drape, hand hygiene and skin antiseptic. A timeout was performed prior to the initiation of  the procedure. The right groin was prepped and draped in the usual sterile fashion. Using a micropuncture kit and the modified Seldinger technique, access was gained to the right common femoral artery and an 8 French sheath was placed. Real-time ultrasound guidance was utilized for vascular access including the acquisition of a permanent ultrasound image documenting patency of the accessed vessel. Under fluoroscopy, a Zoom 88 guide catheter was navigated over a 6 [place] Berenstein 2 catheter and a 0.035" Terumo Glidewire into the aortic arch. The catheter was placed into the left common carotid artery and then advanced into the left internal carotid artery. The diagnostic catheter was removed. Frontal and lateral angiograms of the head were obtained.  FINDINGS: 1. Normal caliber of the right common femoral artery, adequate for vascular access. 2. Since prior CT angiogram, there has been clot fragmentation with subocclusive clot in the left M1/MCA extending into and occluding the M2/MCA superior division branch. 3. There is subocclusive clot in a distal left M2/MCA inferior division branch as well as multiple clots scattered throughout distal branches of the M2/MCA inferior division. 4. Nonocclusive clot in a distal left callosomarginal artery branch with slow anterograde flow and brisk retrograde opacification from left MCA collaterals. PROCEDURE: Using biplane roadmap guidance, a FreeClimb 70 aspiration catheter was navigated over Tenzing delivery catheter and an Aristotle 14 microguidewire into the cavernous segment of the left ICA. The aspiration catheter was then advanced to the level of occlusion in the proximal left M2/MCA superior division branch and connected to an aspiration pump. Continuous aspiration was performed for 2 minutes. The guide catheter was connected to a VacLok syringe. The aspiration catheter was subsequently removed under constant aspiration. The guide catheter was aspirated for debris. Left  internal carotid artery angiograms with frontal and lateral views of the head showed recanalization of the left M2/MCA superior division branch. There is also resolution of the filling defect within the M1 segment. However there is distal embolization to a distal M2/MCA posterior division branch. Using biplane roadmap guidance, a Red 43 aspiration catheter was navigated over an Aristotle 14 microguidewire into the cavernous segment of the left ICA. The aspiration catheter was then advanced to the level of occlusion in the distal M2 segment and connected to an aspiration pump. Continuous aspiration was performed for 2 minutes. The guide catheter was connected to a VacLok syringe. The aspiration catheter was subsequently removed under constant aspiration. The guide catheter was aspirated for debris. Left internal carotid artery angiograms with frontal and lateral views of the head showed recanalization of the M2 segment with distal embolus show and M3 segment. Persistent filling defect in a separate M2 branch with slow anterograde flow. Using biplane roadmap guidance, a Socrate 38 aspiration catheter was navigated over an Aristotle 14 microguidewire into the cavernous segment of the left ICA. The aspiration catheter was then advanced to the level of occlusion in the distal M2 segment and connected to an aspiration pump. Continuous aspiration was performed for 2 minutes. The guide catheter was connected to a VacLok syringe. The aspiration catheter was subsequently removed under constant aspiration. The guide catheter was aspirated for debris. Left internal carotid artery angiograms with frontal lateral views of the head showed recanalization of the proximal M3 segment with further embolization into the M4 segment. Using biplane roadmap guidance, a Red 43 aspiration catheter was navigated over an Aristotle 14 microguidewire into the cavernous segment of the left ICA. The aspiration catheter was then advanced to the  level of occlusion in a different distal M2 segment and connected to an aspiration pump. Continuous aspiration was performed for 2 minutes. The guide catheter was connected to a VacLok syringe. The aspiration catheter was subsequently removed under constant aspiration. The guide catheter was aspirated for debris. Left internal carotid artery angiograms showed recanalization of the M2 segment. Persistent filling defect within the a left M4/MCA branch to the posterior parietal region and to the posterior temporal region. Using biplane roadmap guidance, a Red 43 aspiration catheter was navigated over an Aristotle 14 microguidewire into the cavernous segment of the left ICA. The wire was then advanced to the level of the filling defect in the M4 segment to the posterior parietal region. The aspiration  catheter was not advanced to that level. Frontal and lateral angiograms of the head showed slow flow to the M4 branches the posterior parietal region with persistent occlusion of the branch to the posterior temporal region. Flat panel CT of the head was obtained and post processed in a separate workstation with concurrent attending physician supervision. Selected images were sent to PACS. Follow-up left internal carotid artery angiograms with frontal lateral views of the head showed stable findings in the left MCA vascular tree, as well as left ACA with brisk retrograde flow of a distal branch of the left callosum marginal artery via leptomeningeal collaterals. The catheter was retracted into the left common carotid artery. Contrast injection showed no evidence of stenosis or filling defect in the cervical left ICA. The catheter was subsequently withdrawn. Right common femoral artery angiogram was obtained in right anterior oblique view. The puncture is at the level of the common femoral artery. The artery has normal caliber, adequate for closure device. The sheath was exchanged over the wire for a Perclose prostyle which  was utilized for access closure. Immediate hemostasis was achieved. IMPRESSION: 1. Mechanical thrombectomy performed for treatment of subocclusive left M1/MCA filling defect, a occlusion of the proximal left M2/MCA superior division branch, distal left M2/MCA posterior division branch achieving adequate recanalization (TICI 2B). Persistent slow flow in a posterior parietal cortical branch and persistent occlusion of a posterior temporal cortical branch seen on final angiogram. 2. Nonocclusive clot in a distal left callosomarginal artery branch with slow anterograde flow and brisk retrograde opacification from left MCA leptomeningeal collaterals. PLAN: ICU level of care. Electronically Signed   By: Baldemar Lenis M.D.   On: 08/31/2023 15:55    Vitals:   09/01/23 0930 09/01/23 0945 09/01/23 1100 09/01/23 1200  BP: (!) 153/66 (!) 155/60 (!) 125/46 (!) 125/47  Pulse: 84 80 66 67  Resp: 14 14 17 17   Temp:    98.1 F (36.7 C)  TempSrc:    Axillary  SpO2: 99% 98% 99% 100%     PHYSICAL EXAM General:  Alert, well-nourished, well-developed elderly patient in no acute distress Psych:  Mood and affect appropriate for situation CV: Regular rate and rhythm on monitor Respiratory:  Regular, unlabored respirations on room air   NEURO:  Mental Status: AA&Ox3, able to follow single step and multistep commands Speech/Language: speech is with some expressive aphasia in single words and short phrases.  Patient is able to name 3 out of 4 objects and can repeat phrases well.  Comprehension is intact.  Cranial Nerves:  II: PERRL. Visual fields full.  III, IV, VI: EOMI. Eyelids elevate symmetrically.  V: Sensation is intact to light touch and symmetrical to face.  VII: Face is symmetrical resting and smiling VIII: hearing intact to voice. IX, X: Phonation is normal.  XII: tongue is midline without fasciculations. Motor: Able to move all 4 extremities with antigravity strength, right stronger  the left Tone: is normal and bulk is normal Sensation- Intact to light touch bilaterally. Extinction absent to light touch to DSS.   Coordination: FTN intact bilaterally.No drift.  Gait- deferred  ASSESSMENT/PLAN  Acute Ischemic Infarct: Patchy left MCA territory infarcts s/p mechanical thrombectomy and TNK Etiology: Large vessel occlusion Code Stroke CT head No acute abnormality.  Dense calcification in proximal left M1, chronic left frontal infarct ASPECTS 10.    CTA head & neck distal left M1 segment and proximal superior and inferior M2 segments are occluded MRI patchy left MCA territory infarcts  2D Echo pending LDL 63 HgbA1c 6.7 VTE prophylaxis -SCDs No antithrombotic prior to admission, now on No antithrombotic as she is less than 24 hours from TNK administration Therapy recommendations:  Pending Disposition: Pending  Hx of Stroke/TIA Old left frontal lobe infarct seen on CT  Hypertension Home meds: Lisinopril 20 mg daily, metoprolol 50 mg twice daily Stable Blood Pressure Goal: SBP 120-160 for first 24 hours then less than 180   Hyperlipidemia Home meds: Simvastatin 20 mg daily, resumed in hospital LDL 63, goal < 70 High intensity statin not indicated as LDL below goal Continue statin at discharge  Diabetes type II Controlled Home meds: Glipizide 10 mg twice daily, insulin glargine 48 units daily, metformin 1000 mg twice daily HgbA1c 6.7, goal < 7.0 CBGs SSI Recommend close follow-up with PCP for better DM control  Dysphagia Patient has post-stroke dysphagia, SLP consulted    Diet   Diet Carb Modified Fluid consistency: Thin; Room service appropriate? Yes with Assist   Advance diet as tolerated  Other Stroke Risk Factors None   Other Active Problems None  Hospital day # 1  Patient seen by NP and then by MD, MD to edit note as needed. Cortney E Ernestina Columbia , MSN, AGACNP-BC [facility] See Amion for schedule and pager  information 09/01/2023 1:07 PM  Addendum: At approximately 1:30 PM, patient began to complain of horizontal diplopia with faraway objects.  This began sometime after 10 AM today.  Diplopia disappears when she closes 1 eye and does not increase with gaze to the right or the left.  Neurological exam is otherwise unchanged from this morning.  Will obtain head CT and reassess patient after she has her glasses from home.  CT reviewed showed no acute abnormality.   STROKE MD NOTE :  I have personally obtained history,examined this patient, reviewed notes, independently viewed imaging studies, participated in medical decision making and plan of care.ROS completed by me personally and pertinent positives fully documented  I have made any additions or clarifications directly to the above note. Agree with note above.  She presented with sudden onset of aphasia and right-sided weakness secondary to left M1 occlusion and was treated with IV TNK.  Fluctuating exam and required emergent mechanical thrombectomy with TICI 2 B revascularization achieved.  Her neurological exam shows improvement now with only mild residual aphasia.  Continue close neurological observation and strict blood pressure control as per post TNK and thrombectomy protocol.  Mobilize out of bed.  Therapy consults.  Continue ongoing stroke workup.  She will need prolonged cardiac monitoring at discharge to look for paroxysmal A-fib.  Long discussion with patient, husband and daughter at the bedside and answered questions.  Discussed with Dr. Sherlon Handing neurointerventional radiology.This patient is critically ill and at significant risk of neurological worsening, death and care requires constant monitoring of vital signs, hemodynamics,respiratory and cardiac monitoring, extensive review of multiple databases, frequent neurological assessment, discussion with family, other specialists and medical decision making of high complexity.I have made any additions  or clarifications directly to the above note.This critical care time does not reflect procedure time, or teaching time or supervisory time of PA/NP/Med Resident etc but could involve care discussion time.  I spent 30 minutes of neurocritical care time  in the care of  this patient.      Delia Heady, MD Medical Director [facility] Pager: [phone] 09/01/2023 5:16 PM  To contact Stroke Continuity provider, please refer to [email or web address]. After hours, contact General  Neurology

## 2023-09-02 DIAGNOSIS — I639 Cerebral infarction, unspecified: Secondary | ICD-10-CM | POA: Diagnosis not present

## 2023-09-02 LAB — GLUCOSE, CAPILLARY
Glucose-Capillary: 146 mg/dL — ABNORMAL HIGH (ref 70–99)
Glucose-Capillary: 172 mg/dL — ABNORMAL HIGH (ref 70–99)
Glucose-Capillary: 233 mg/dL — ABNORMAL HIGH (ref 70–99)
Glucose-Capillary: 176 mg/dL — ABNORMAL HIGH (ref 70–99)
Glucose-Capillary: 189 mg/dL — ABNORMAL HIGH (ref 70–99)

## 2023-09-02 MED ORDER — LISINOPRIL 10 MG PO TABS
10.0000 mg | ORAL_TABLET | Freq: Every day | ORAL | Status: DC
  Administered 2023-09-02 – 2023-09-03 (×2): 10 mg via ORAL
  Filled 2023-09-02 (×2): qty 1

## 2023-09-02 MED ORDER — ENOXAPARIN SODIUM 40 MG/0.4ML IJ SOSY
40.0000 mg | PREFILLED_SYRINGE | INTRAMUSCULAR | Status: DC
  Administered 2023-09-02 – 2023-09-06 (×5): 40 mg via SUBCUTANEOUS
  Filled 2023-09-02 (×5): qty 0.4

## 2023-09-02 MED ORDER — INSULIN GLARGINE-YFGN 100 UNIT/ML ~~LOC~~ SOLN
8.0000 [IU] | Freq: Every day | SUBCUTANEOUS | Status: DC
  Administered 2023-09-02 – 2023-09-03 (×2): 8 [IU] via SUBCUTANEOUS
  Filled 2023-09-02 (×3): qty 0.08

## 2023-09-02 MED ORDER — METOPROLOL TARTRATE 25 MG PO TABS
25.0000 mg | ORAL_TABLET | Freq: Two times a day (BID) | ORAL | Status: DC
  Administered 2023-09-02 – 2023-09-03 (×3): 25 mg via ORAL
  Filled 2023-09-02 (×3): qty 1

## 2023-09-02 MED ORDER — ASPIRIN 81 MG PO TBEC
81.0000 mg | DELAYED_RELEASE_TABLET | Freq: Every day | ORAL | Status: DC
  Administered 2023-09-02 – 2023-09-06 (×5): 81 mg via ORAL
  Filled 2023-09-02 (×5): qty 1

## 2023-09-02 MED ORDER — CLOPIDOGREL BISULFATE 75 MG PO TABS
75.0000 mg | ORAL_TABLET | Freq: Every day | ORAL | Status: DC
  Administered 2023-09-02 – 2023-09-06 (×5): 75 mg via ORAL
  Filled 2023-09-02 (×5): qty 1

## 2023-09-02 MED ORDER — HYDROMORPHONE HCL 2 MG PO TABS: 4.0000 mg | ORAL_TABLET | Freq: Four times a day (QID) | ORAL | Status: DC | PRN

## 2023-09-02 NOTE — Progress Notes (Signed)
STROKE TEAM PROGRESS NOTE   BRIEF HPI [name] is a 82 y.o. female with history of type 2 diabetes, nonalcoholic fatty liver disease, GERD, hypertension, hyperlipidemia and nonspecific cardiac arrhythmia presenting with acute onset right-sided weakness and aphasia.  She was found to have a left M1 MCA occlusion.  She was given TNK at [facility] and transferred here for mechanical thrombectomy.  This was successful with TICI 2B recanalization achieved.   SIGNIFICANT HOSPITAL EVENTS 10/21: TNK given and mechanical thrombectomy performed with TICI 2B recanalization  INTERIM HISTORY/SUBJECTIVE Patient has been hemodynamically stable and afebrile overnight.  Expressive aphasia has improved and she cannot speak in sentences..  Left-sided weakness has improved also.  Vital signs stable.  Blood pressure adequately controlled. OBJECTIVE  CBC    Component Value Date/Time   WBC 14.2 (H) 09/01/2023 0656   RBC 3.77 (L) 09/01/2023 0656   HGB 11.9 (L) 09/01/2023 0656   HCT 35.4 (L) 09/01/2023 0656   PLT 222 09/01/2023 0656   MCV 93.9 09/01/2023 0656   MCH 31.6 09/01/2023 0656   MCHC 33.6 09/01/2023 0656   RDW 13.9 09/01/2023 0656   LYMPHSABS 2.3 08/31/2023 0930   MONOABS 0.6 08/31/2023 0930   EOSABS 0.2 08/31/2023 0930   BASOSABS 0.0 08/31/2023 0930    BMET    Component Value Date/Time   NA 140 09/01/2023 0656   NA 143 02/10/2023 1503   K 3.7 09/01/2023 0656   CL 105 09/01/2023 0656   CO2 24 09/01/2023 0656   GLUCOSE 131 (H) 09/01/2023 0656   BUN 11 09/01/2023 0656   BUN 20 02/10/2023 1503   CREATININE 0.72 09/01/2023 0656   CALCIUM 8.3 (L) 09/01/2023 0656   EGFR 67 02/10/2023 1503   GFRNONAA >60 09/01/2023 0656    IMAGING past 24 hours No results found.  Vitals:   09/02/23 1300 09/02/23 1400 09/02/23 1500 09/02/23 1600  BP: (!) 148/51 (!) 142/59 139/67   Pulse: 80 75 76   Resp: 14 (!) 22 (!) 24   Temp:    (!) 97.5 F (36.4 C)  TempSrc:    Oral  SpO2: 97% 97% 95%       PHYSICAL EXAM General:  Alert, well-nourished, well-developed elderly patient in no acute distress Psych:  Mood and affect appropriate for situation CV: Regular rate and rhythm on monitor Respiratory:  Regular, unlabored respirations on room air   NEURO:  Mental Status: AA&Ox3, able to follow single step and multistep commands Speech/Language: speech is with minimal expressive aphasia and able to speak sentences.  Patient is able to name 3 out of 4 objects and can repeat phrases well.  Comprehension is intact.  Cranial Nerves:  II: PERRL. Visual fields full.  III, IV, VI: EOMI. Eyelids elevate symmetrically.  V: Sensation is intact to light touch and symmetrical to face.  VII: Face is symmetrical resting and smiling VIII: hearing intact to voice. IX, X: Phonation is normal.  XII: tongue is midline without fasciculations. Motor: Able to move all 4 extremities with antigravity strength, right stronger the left Tone: is normal and bulk is normal Sensation- Intact to light touch bilaterally. Extinction absent to light touch to DSS.   Coordination: FTN intact bilaterally.No drift.  Gait- deferred  ASSESSMENT/PLAN  Acute Ischemic Infarct: Patchy left MCA territory infarcts s/p mechanical thrombectomy and TNK Etiology: Large vessel occlusion Code Stroke CT head No acute abnormality.  Dense calcification in proximal left M1, chronic left frontal infarct ASPECTS 10.    CTA head &  neck distal left M1 segment and proximal superior and inferior M2 segments are occluded MRI patchy left MCA territory infarcts 2D Echo ejection fraction 60 to 65%.  Bubble study negative.  Left atrial size normal. LDL 63 HgbA1c 6.7 VTE prophylaxis -SCDs No antithrombotic prior to admission, now on No antithrombotic as she is less than 24 hours from TNK administration Therapy recommendations:  Pending Disposition: Pending  Hx of Stroke/TIA Old left frontal lobe infarct seen on CT  Hypertension Home  meds: Lisinopril 20 mg daily, metoprolol 50 mg twice daily Stable Blood Pressure Goal: SBP 120-160 for first 24 hours then less than 180   Hyperlipidemia Home meds: Simvastatin 20 mg daily, resumed in hospital LDL 63, goal < 70 High intensity statin not indicated as LDL below goal Continue statin at discharge  Diabetes type II Controlled Home meds: Glipizide 10 mg twice daily, insulin glargine 48 units daily, metformin 1000 mg twice daily HgbA1c 6.7, goal < 7.0 CBGs SSI Recommend close follow-up with PCP for better DM control  Dysphagia Patient has post-stroke dysphagia, SLP consulted    Diet   Diet Carb Modified Fluid consistency: Thin; Room service appropriate? Yes with Assist   Advance diet as tolerated  Other Stroke Risk Factors None   Other Active Problems None  Hospital day # 2   She presented with sudden onset of aphasia and right-sided weakness secondary to left M1 occlusion and was treated with IV TNK.  Fluctuating exam and required emergent mechanical thrombectomy with TICI 2 B revascularization achieved.  Her neurological exam shows improvement now with only mild residual aphasia.  Continue close neurological observation and strict blood pressure control as per post TNK and thrombectomy protocol.  Mobilize out of bed.  Therapy consults.  Transfer out of ICU when bed available.  Start aspirin.  She will need prolonged cardiac monitoring at discharge to look for paroxysmal A-fib.  Long discussion with patient, husband and daughter at the bedside and answered questions.  This patient is critically ill and at significant risk of neurological worsening, death and care requires constant monitoring of vital signs, hemodynamics,respiratory and cardiac monitoring, extensive review of multiple databases, frequent neurological assessment, discussion with family, other specialists and medical decision making of high complexity.I have made any additions or clarifications directly to  the above note.This critical care time does not reflect procedure time, or teaching time or supervisory time of PA/NP/Med Resident etc but could involve care discussion time.  I spent 30 minutes of neurocritical care time  in the care of  this patient.         Delia Heady, MD Medical Director [facility] Pager: [phone] 09/02/2023 4:21 PM  To contact Stroke Continuity provider, please refer to [email or web address]. After hours, contact General Neurology

## 2023-09-02 NOTE — Evaluation (Signed)
Speech Language Pathology Evaluation Patient Details Name: Molly [name] MRN: [id number] DOB: July 20, 1941 Today's Date: 09/02/2023 Time: 2952-8413 SLP Time Calculation (min) (ACUTE ONLY): 20 min  Problem List:  Patient Active Problem List   Diagnosis Date Noted   Stroke (HCC) 08/31/2023   Acute ischemic stroke (HCC) 08/31/2023   Compression fracture of T12 vertebra (HCC) 02/12/2023   Abnormal MRI, lumbar spine (10/17/2022) 02/10/2023   Colon polyp 02/10/2023   Chronic pain syndrome 02/10/2023   Pharmacologic therapy 02/10/2023   Disorder of skeletal system 02/10/2023   Problems influencing health status 02/10/2023   Chronic low back pain (1ry area of Pain) (Bilateral) w/o sciatica 02/10/2023   Compression fracture of L1 lumbar vertebra, sequela 02/10/2023   History of kyphoplasty (L1) 02/10/2023   Radicular pain of thoracic region 02/10/2023   Compression fracture of L2 lumbar vertebra, sequela 02/10/2023   Grade 1 Retrolisthesis of L2/L3 and L3/L4 02/10/2023   Lumbar facet arthropathy (Multilevel) (Bilateral) 02/10/2023   Synovial cyst of lumbar facet joint (L5-S1) (Left) 02/10/2023   DDD (degenerative disc disease), lumbosacral 02/10/2023   Age-related osteoporosis with current pathological fracture, vertebra(e), sequela 02/10/2023   DDD (degenerative disc disease), thoracic 02/10/2023   Chronic recurrent pancreatitis (HCC) 05/07/2021   Acute respiratory failure with hypoxia (HCC) 01/29/2021   Constipation 01/26/2021   Pancreatitis, recurrent 01/25/2021   Hypertension    Fatty liver disease, nonalcoholic 05/18/2019   Post-menopausal osteoporosis 05/16/2019   Hx of adenomatous colonic polyps 05/16/2019   H/O acute pancreatitis 04/01/2018   Drug-induced acute pancreatitis without infection or necrosis 03/26/2018   Acute pancreatitis 03/18/2018   Type 2 diabetes mellitus (HCC) 12/24/2016   Abnormal TSH 06/29/2015   HTN (hypertension) 06/06/2014   Diabetes (HCC) 06/06/2014    Hyperlipidemia 06/06/2014   Osteopenia 06/06/2014   Chronic thoracic back pain (2ry area of Pain) (Bilateral) 10/01/2010   SINUSITIS - ACUTE-NOS 08/30/2009   Disturbance in sleep behavior 08/30/2009   GOUT 04/03/2009   GERD (gastroesophageal reflux disease) 09/08/2008   Palpitations 12/14/2007   DIABETES MELLITUS, TYPE II 07/20/2007   HYPERLIPIDEMIA 07/20/2007   IBS 07/20/2007   Past Medical History:  Past Medical History:  Diagnosis Date   Arrhythmia    Arthritis    Basal cell carcinoma    right neck, right forehead - treated in the past   Cancer (HCC)    skin   CMV (cytomegalovirus infection) (HCC)    Colon polyps    Diabetes mellitus, type 2 (HCC)    Duodenitis    Dysrhythmia    Fatty liver disease, nonalcoholic    FH: colonic polyps    hx of-adenomatous   GERD (gastroesophageal reflux disease)    Heart murmur    dx'd in 1960s. followed by PCP   Hemorrhoid .   Hyperlipidemia    Hypertension    IBS (irritable bowel syndrome)    Osteoporosis    Osteoporosis    Pancreatitis    drug induced   Past Surgical History:  Past Surgical History:  Procedure Laterality Date   ABDOMINAL HYSTERECTOMY     Carotids  3/08   mild only   CATARACT EXTRACTION W/PHACO Right 06/29/2017   Procedure: CATARACT EXTRACTION PHACO AND INTRAOCULAR LENS PLACEMENT (IOC)  Toric Diabetic Right;  Surgeon: Nevada Crane, MD;  Location: [facility];  Service: Ophthalmology;  Laterality: Right;  diabetic - oral meds   CATARACT EXTRACTION W/PHACO Right 07/27/2017   Procedure: CATARACT EXTRACTION PHACO AND INTRAOCULAR LENS PLACEMENT (IOC) LEFT DIABETIC TORIC;  Surgeon: Nevada Crane, MD;  Location: [facility];  Service: Ophthalmology;  Laterality: Right;  Diabetic - oral meds   COLONOSCOPY     COLONOSCOPY WITH PROPOFOL N/A 07/20/2019   Procedure: COLONOSCOPY WITH PROPOFOL;  Surgeon: Toledo, Boykin Nearing, MD;  Location: [facility];  Service: Gastroenterology;  Laterality: N/A;    cystocele, cystourethropexy  12/87   dexa     increase T - 3.1 spine, normal femur 1/04   ESOPHAGOGASTRODUODENOSCOPY     ESOPHAGOGASTRODUODENOSCOPY (EGD) WITH PROPOFOL N/A 04/16/2018   Procedure: ESOPHAGOGASTRODUODENOSCOPY (EGD) WITH PROPOFOL;  Surgeon: Scot Jun, MD;  Location: [facility];  Service: Endoscopy;  Laterality: N/A;   ESOPHAGOGASTRODUODENOSCOPY (EGD) WITH PROPOFOL N/A 07/20/2019   Procedure: ESOPHAGOGASTRODUODENOSCOPY (EGD) WITH PROPOFOL;  Surgeon: Toledo, Boykin Nearing, MD;  Location: [facility] END[facility];  Service: Gastroenterology;  Laterality: N/A;   EYE SURGERY     hysterectomy (other)  1980   IR CT HEAD LTD  08/31/2023   IR PERCUTANEOUS ART THROMBECTOMY/INFUSION INTRACRANIAL INC DIAG ANGIO  08/31/2023   IR US GUIDE VASC ACCESS RIGHT  08/31/2023   RADIOLOGY WITH ANESTHESIA N/A 08/31/2023   Procedure: IR WITH ANESTHESIA;  Surgeon: Radiologist, Medication, MD;  Location: [facility] OR;  Service: Radiology;  Laterality: N/A;   TUBAL LIGATION  1978   vaginal deliveries     x2   HPI:  Molly [name] is an 82 yo female presenting to ED 10/21 with acute onset of R sided weakness. Initial CTH negative, although pt had neuro change while admitted and subsequent MRI Brain 10/22 showed acute L MCA and ACA territory infarcts involving L frontal lobe, parietal lobe, and body/head of caudate. PMH includes T2DM, nonalcoholic fatty liver disease, GERD, essential HTN, HLD, nonspecific cardiac arrhythmia   Assessment / Plan / Recommendation Clinical Impression  Pt states that she lives with her husband and is typically independent with cognitive tasks, although endorses a history of difficulty with attention and memory. Pt and her family report pt's language has improved, although she still has word-finding difficulties primarily at the conversation level. No significant expressive deficits noted this date, however, recognize the possibility that errors occur in spontaneous speech and with higher  frequency when pt is fatigued. She scored 20/30 on the SLUMS (a score of 27 or above is considered WFL) characterized by deficits related to sustained attention and problem solving. Pt was only able to name 8 animals in a one-minute time period which is likely attributed to deficits with attention as pt was 100% accurate with a confrontation naming activity. Pt had significant difficulty with the clock drawing task, arranging the numbers backwards before eventually recognizing awareness and restarting, although note pt still produced errors. Suspect pt is experiencing acute on chronic cognitive changes and will likely benefit from continued ST intervention to target deficits listed above. SLP will continue to follow acutely and recommend ongoing intervention upon d/c.    SLP Assessment  SLP Recommendation/Assessment: Patient needs continued Speech Lanaguage Pathology Services SLP Visit Diagnosis: Cognitive communication deficit (R41.841)    Recommendations for follow up therapy are one component of a multi-disciplinary discharge planning process, led by the attending physician.  Recommendations may be updated based on patient status, additional functional criteria and insurance authorization.    Follow Up Recommendations  Acute inpatient rehab (3hours/day)    Assistance Recommended at Discharge  Frequent or constant Supervision/Assistance  Functional Status Assessment Patient has had a recent decline in their functional status and demonstrates the ability to make significant improvements in  function in a reasonable and predictable amount of time.  Frequency and Duration min 2x/week  2 weeks      SLP Evaluation Cognition  Overall Cognitive Status: Impaired/Different from baseline Arousal/Alertness: Awake/alert Orientation Level: Oriented X4 Attention: Sustained Sustained Attention: Impaired Sustained Attention Impairment: Verbal basic Memory: Impaired Memory Impairment: Retrieval  deficit Awareness: Impaired Awareness Impairment: Emergent impairment Problem Solving: Impaired Problem Solving Impairment: Verbal basic       Comprehension  Auditory Comprehension Overall Auditory Comprehension: Appears within functional limits for tasks assessed    Expression Expression Primary Mode of Expression: Verbal Verbal Expression Overall Verbal Expression: Impaired Naming: Impairment Confrontation: Within functional limits Divergent: 25-49% accurate Written Expression Dominant Hand: Right   Oral / Motor  Oral Motor/Sensory Function Overall Oral Motor/Sensory Function: Within functional limits Motor Speech Overall Motor Speech: Appears within functional limits for tasks assessed            Gwynneth Aliment, M.A., CF-SLP Speech Language Pathology, Acute Rehabilitation Services  Secure Chat preferred [phone]  09/02/2023, 4:21 PM

## 2023-09-02 NOTE — Progress Notes (Signed)
Physical Therapy Treatment Patient Details Name: Molly [name] MRN: [id number] DOB: 04-28-1941 Today's Date: 09/02/2023   History of Present Illness 82 yo female presenting with R side weakness and aphasia. Imaging + for L M1 MCA occlusion s/p TNK at [facility] with IR thrombectomy successful. PMH DM2, nonalcoholic fatty liver disease, GERD HTN HLD nonspecific cardiac arrhythmia L frontal lobe infarct.    PT Comments  Patient progressing well and eager to mobilize with therapy. Pt able to raise bil LE's in supine and pivot to sit up EOB, min assist to fully raise trunk and cues to reach across midline to bed rail and scoot to EOB. Pt required min assist for sit<>stand with RW from EOB and to guide pivot bed>chair. Overall good static balance with slightly wide BOS but normalized with turn to chair. Progressed to ambulation ~200' with RW for support and ~25' with HHA. Pt requires min assist for higher level balance challenges like turns and with dual task (cognitive challenge) gait noted to be more limited with Rt drift, narrowed steps, and decr in velocity. Pt requesting to go to bathroom and min assist provided to amb with RW and navigate turn in tighter space of bathroom. Pt able to maintain seated balance with CGA/min assist to grab toilet paper and complete pericare. EOS pt returned to rest in recliner, Alarm on and call bell within reach and family present. Will continue to progress as able; rec intense rehab follow up >3 hours/day.   If plan is discharge home, recommend the following: Direct supervision/assist for medications management;Assist for transportation;Supervision due to cognitive status;Help with stairs or ramp for entrance;A lot of help with walking and/or transfers;A lot of help with bathing/dressing/bathroom;Assistance with cooking/housework   Can travel by private vehicle        Equipment Recommendations  Rolling walker (2 wheels) (TBD at next venue)    Recommendations for Other  Services Rehab consult     Precautions / Restrictions Precautions Precautions: Fall Precaution Comments: SBP <180 Restrictions Weight Bearing Restrictions: No     Mobility  Bed Mobility Overal bed mobility: Needs Assistance Bed Mobility: Supine to Sit, Sit to Supine, Rolling Rolling: Used rails, Min assist         General bed mobility comments: Cues to use bed rail and sequence reaching Lt UE across to pivot/turn trunk. Min assist to fully raise trunk and scoot to edge.    Transfers Overall transfer level: Needs assistance Equipment used: Rolling walker (2 wheels) Transfers: Sit to/from Stand, Bed to chair/wheelchair/BSC Sit to Stand: Min assist   Step pivot transfers: Min assist       General transfer comment: cues for hand placement to rise to RW, min assist for power up and pt steady once achieved upright posture with bil UE support on RW. Min assist to guide turn for bed>chair with RW.    Ambulation/Gait Ambulation/Gait assistance: Min assist Gait Distance (Feet): 225 Feet Assistive device: Rolling walker (2 wheels), 1 person hand held assist Gait Pattern/deviations: Step-through pattern, Decreased stride length, Shuffle, Decreased dorsiflexion - right, Drifts right/left Gait velocity: decr     General Gait Details: Min assist with RW, cues at start for position to RW. Pt progressing towards CGA for straight gait. Min assist needed to stabilize balance with turns. Pt challenged w/ dual task to count backwards from 10 and abel to perform cog task. Gait speed slowed with cog task and pt drifting Rt requiring assist to stabilize and avoid walking towards wall.   Stairs  Wheelchair Mobility     Tilt Bed    Modified Rankin (Stroke Patients Only) Modified Rankin (Stroke Patients Only) Pre-Morbid Rankin Score: No symptoms Modified Rankin: Moderately severe disability     Balance Overall balance assessment: Needs assistance Sitting-balance  support: Feet supported, Bilateral upper extremity supported Sitting balance-Leahy Scale: Good     Standing balance support: Bilateral upper extremity supported, Single extremity supported, During functional activity, Reliant on assistive device for balance Standing balance-Leahy Scale: Poor                              Cognition Arousal: Alert Behavior During Therapy: WFL for tasks assessed/performed Overall Cognitive Status: Impaired/Different from baseline Area of Impairment: Memory, Safety/judgement, Awareness, Problem solving, Following commands                     Memory: Decreased short-term memory Following Commands: Follows one step commands consistently, Follows multi-step commands inconsistently                Exercises      General Comments General comments (skin integrity, edema, etc.): VSS      Pertinent Vitals/Pain Pain Assessment Pain Assessment: Faces Faces Pain Scale: Hurts a little bit Pain Location: neck Pain Descriptors / Indicators: Aching, Discomfort Pain Intervention(s): Limited activity within patient's tolerance, Monitored during session, Repositioned    Home Living     Available Help at Discharge: Family;Available 24 hours/day Type of Home: House                  Prior Function            PT Goals (current goals can now be found in the care plan section) Acute Rehab PT Goals Patient Stated Goal: regain function/independence and mobility PT Goal Formulation: With patient/family Time For Goal Achievement: 09/15/23 Potential to Achieve Goals: Good Progress towards PT goals: Progressing toward goals    Frequency    Min 1X/week      PT Plan      Co-evaluation              AM-PAC PT "6 Clicks" Mobility   Outcome Measure  Help needed turning from your back to your side while in a flat bed without using bedrails?: A Little Help needed moving from lying on your back to sitting on the side of a  flat bed without using bedrails?: A Little Help needed moving to and from a bed to a chair (including a wheelchair)?: A Little Help needed standing up from a chair using your arms (e.g., wheelchair or bedside chair)?: A Little Help needed to walk in hospital room?: A Little Help needed climbing 3-5 steps with a railing? : A Lot 6 Click Score: 17    End of Session Equipment Utilized During Treatment: Gait belt Activity Tolerance: Patient tolerated treatment well Patient left: in chair;with call bell/phone within reach;with chair alarm set;with family/visitor present Nurse Communication: Mobility status PT Visit Diagnosis: Unsteadiness on feet (R26.81);Other abnormalities of gait and mobility (R26.89);Muscle weakness (generalized) (M62.81);Difficulty in walking, not elsewhere classified (R26.2);Other symptoms and signs involving the nervous system (R29.898)     Time: 1610-9604 PT Time Calculation (min) (ACUTE ONLY): 49 min  Charges:    $Gait Training: 23-37 mins $Therapeutic Activity: 8-22 mins PT General Charges $$ ACUTE PT VISIT: 1 Visit  Wynn Maudlin, DPT Acute Rehabilitation Services Office [phone]  09/02/23 5:42 PM

## 2023-09-03 ENCOUNTER — Other Ambulatory Visit: Payer: Self-pay | Admitting: Student

## 2023-09-03 DIAGNOSIS — I639 Cerebral infarction, unspecified: Secondary | ICD-10-CM

## 2023-09-03 LAB — GLUCOSE, CAPILLARY
Glucose-Capillary: 275 mg/dL — ABNORMAL HIGH (ref 70–99)
Glucose-Capillary: 248 mg/dL — ABNORMAL HIGH (ref 70–99)
Glucose-Capillary: 197 mg/dL — ABNORMAL HIGH (ref 70–99)
Glucose-Capillary: 195 mg/dL — ABNORMAL HIGH (ref 70–99)
Glucose-Capillary: 261 mg/dL — ABNORMAL HIGH (ref 70–99)

## 2023-09-03 MED ORDER — LABETALOL HCL 5 MG/ML IV SOLN: 20.0000 mg | INTRAVENOUS | Status: DC | PRN

## 2023-09-03 MED ORDER — LISINOPRIL 20 MG PO TABS
20.0000 mg | ORAL_TABLET | Freq: Every day | ORAL | Status: DC
Start: 2023-09-04 — End: 2023-09-06
  Administered 2023-09-04 – 2023-09-06 (×3): 20 mg via ORAL
  Filled 2023-09-03 (×3): qty 1

## 2023-09-03 MED ORDER — METOPROLOL TARTRATE 50 MG PO TABS
50.0000 mg | ORAL_TABLET | Freq: Two times a day (BID) | ORAL | Status: DC
  Administered 2023-09-03 – 2023-09-06 (×6): 50 mg via ORAL
  Filled 2023-09-03 (×6): qty 1

## 2023-09-03 NOTE — Progress Notes (Signed)
Ordered 30 day Event Monitor for further evaluation of stroke at the request of Neurology. She has not been seen by our office since 2011 so we will also arrange a New Patient Visit with Dr. Royann Shivers in 6-8 weeks to review monitor results.  Corrin Parker, PA-C 09/03/2023 3:39 PM

## 2023-09-03 NOTE — TOC Initial Note (Signed)
Transition of Care [facility]) - Initial/Assessment Note    Patient Details  Name: Molly [name] MRN: [id number] Date of Birth: June 29, 1941  Transition of Care [facility]) CM/SW Contact:    Lamonte Sakai, Student-Social Work Phone Number: 09/03/2023, 1:57 PM  Clinical Narrative:                 MSW intern spoke with patient, patient's husband & daughter. Daughter prefers CIR.   Expected Discharge Plan: IP Rehab Facility Barriers to Discharge: Continued Medical Work up   Patient Goals and CMS Choice Patient states their goals for this hospitalization and ongoing recovery are:: To go home          Expected Discharge Plan and Services In-house Referral: Clinical Social Work     Living arrangements for the past 2 months: Single Family Home                                      Prior Living Arrangements/Services Living arrangements for the past 2 months: Single Family Home Lives with:: Spouse Patient language and need for interpreter reviewed:: No Do you feel safe going back to the place where you live?: Yes      Need for Family Participation in Patient Care: No (Comment) Care giver support system in place?: Yes (comment)   Criminal Activity/Legal Involvement Pertinent to Current Situation/Hospitalization: No - Comment as needed  Activities of Daily Living      Permission Sought/Granted Permission sought to share information with : Family Supports                Emotional Assessment Appearance:: Appears stated age Attitude/Demeanor/Rapport: Engaged Affect (typically observed): Calm, Accepting, Stable, Pleasant Orientation: : Oriented to Self, Oriented to Place, Oriented to  Time, Oriented to Situation      Admission diagnosis:  Acute ischemic stroke [facility]) [I63.9] Patient Active Problem List   Diagnosis Date Noted   Stroke (HCC) 08/31/2023   Acute ischemic stroke (HCC) 08/31/2023   Compression fracture of T12 vertebra (HCC) 02/12/2023   Abnormal MRI, lumbar  spine (10/17/2022) 02/10/2023   Colon polyp 02/10/2023   Chronic pain syndrome 02/10/2023   Pharmacologic therapy 02/10/2023   Disorder of skeletal system 02/10/2023   Problems influencing health status 02/10/2023   Chronic low back pain (1ry area of Pain) (Bilateral) w/o sciatica 02/10/2023   Compression fracture of L1 lumbar vertebra, sequela 02/10/2023   History of kyphoplasty (L1) 02/10/2023   Radicular pain of thoracic region 02/10/2023   Compression fracture of L2 lumbar vertebra, sequela 02/10/2023   Grade 1 Retrolisthesis of L2/L3 and L3/L4 02/10/2023   Lumbar facet arthropathy (Multilevel) (Bilateral) 02/10/2023   Synovial cyst of lumbar facet joint (L5-S1) (Left) 02/10/2023   DDD (degenerative disc disease), lumbosacral 02/10/2023   Age-related osteoporosis with current pathological fracture, vertebra(e), sequela 02/10/2023   DDD (degenerative disc disease), thoracic 02/10/2023   Chronic recurrent pancreatitis (HCC) 05/07/2021   Acute respiratory failure with hypoxia (HCC) 01/29/2021   Constipation 01/26/2021   Pancreatitis, recurrent 01/25/2021   Hypertension    Fatty liver disease, nonalcoholic 05/18/2019   Post-menopausal osteoporosis 05/16/2019   Hx of adenomatous colonic polyps 05/16/2019   H/O acute pancreatitis 04/01/2018   Drug-induced acute pancreatitis without infection or necrosis 03/26/2018   Acute pancreatitis 03/18/2018   Type 2 diabetes mellitus (HCC) 12/24/2016   Abnormal TSH 06/29/2015   HTN (hypertension) 06/06/2014   Diabetes (HCC) 06/06/2014  Hyperlipidemia 06/06/2014   Osteopenia 06/06/2014   Chronic thoracic back pain (2ry area of Pain) (Bilateral) 10/01/2010   SINUSITIS - ACUTE-NOS 08/30/2009   Disturbance in sleep behavior 08/30/2009   GOUT 04/03/2009   GERD (gastroesophageal reflux disease) 09/08/2008   Palpitations 12/14/2007   DIABETES MELLITUS, TYPE II 07/20/2007   HYPERLIPIDEMIA 07/20/2007   IBS 07/20/2007   PCP:  Marguarite Arbour,  MD Pharmacy:   [facility] [phone] [address], [place] - [address] [place] [address] Phone: [phone] Fax: [phone]     Social Determinants of Health (SDOH) Social History: SDOH Screenings   Depression (PHQ2-9): Low Risk  (02/10/2023)  Tobacco Use: Medium Risk (08/20/2023)   Received from [facility]   SDOH Interventions:     Readmission Risk Interventions     No data to display

## 2023-09-03 NOTE — Progress Notes (Addendum)
STROKE TEAM PROGRESS NOTE   BRIEF HPI [name] is a 82 y.o. female with history of type 2 diabetes, nonalcoholic fatty liver disease, GERD, hypertension, hyperlipidemia and nonspecific cardiac arrhythmia presenting with acute onset right-sided weakness and aphasia.  She was found to have a left M1 MCA occlusion.  She was given TNK at [facility] and transferred here for mechanical thrombectomy.  This was successful with TICI 2B recanalization achieved.   SIGNIFICANT HOSPITAL EVENTS 10/21: TNK given and mechanical thrombectomy performed with TICI 2B recanalization 10/24: Transfer out of ICU  INTERIM HISTORY/SUBJECTIVE Patient has been hemodynamically stable and afebrile overnight.  Expressive aphasia has improved and she can now speak in sentences..  Left-sided weakness has improved also.  Vital signs stable.  Blood pressure adequately controlled. Transfer out of ICU. CIR pending   OBJECTIVE  CBC    Component Value Date/Time   WBC 14.2 (H) 09/01/2023 0656   RBC 3.77 (L) 09/01/2023 0656   HGB 11.9 (L) 09/01/2023 0656   HCT 35.4 (L) 09/01/2023 0656   PLT 222 09/01/2023 0656   MCV 93.9 09/01/2023 0656   MCH 31.6 09/01/2023 0656   MCHC 33.6 09/01/2023 0656   RDW 13.9 09/01/2023 0656   LYMPHSABS 2.3 08/31/2023 0930   MONOABS 0.6 08/31/2023 0930   EOSABS 0.2 08/31/2023 0930   BASOSABS 0.0 08/31/2023 0930    BMET    Component Value Date/Time   NA 140 09/01/2023 0656   NA 143 02/10/2023 1503   K 3.7 09/01/2023 0656   CL 105 09/01/2023 0656   CO2 24 09/01/2023 0656   GLUCOSE 131 (H) 09/01/2023 0656   BUN 11 09/01/2023 0656   BUN 20 02/10/2023 1503   CREATININE 0.72 09/01/2023 0656   CALCIUM 8.3 (L) 09/01/2023 0656   EGFR 67 02/10/2023 1503   GFRNONAA >60 09/01/2023 0656    IMAGING past 24 hours No results found.  Vitals:   09/03/23 0900 09/03/23 1030 09/03/23 1100 09/03/23 1200  BP: (!) 161/50 (!) 144/58 (!) 153/56   Pulse: 72 61 (!) 58   Resp: (!) 21 17 17    Temp:     97.9 F (36.6 C)  TempSrc:    Oral  SpO2: 92% 92% 92%      PHYSICAL EXAM General:  Alert, well-nourished, well-developed elderly patient in no acute distress Psych:  Mood and affect appropriate for situation CV: Regular rate and rhythm on monitor Respiratory:  Regular, unlabored respirations on room air   NEURO:  Mental Status: AA&Ox3, able to follow single step and multistep commands Speech/Language: speech is with minimal expressive aphasia and able to speak sentences.  Patient is able to name 3 out of 4 objects and can repeat phrases well.  Comprehension is intact.  Cranial Nerves:  II: PERRL. Visual fields full.  III, IV, VI: EOMI. Eyelids elevate symmetrically.  V: Sensation is intact to light touch and symmetrical to face.  VII: Face is symmetrical resting and smiling VIII: hearing intact to voice. IX, X: Phonation is normal.  XII: tongue is midline without fasciculations. Motor: Able to move all 4 extremities with antigravity strength, right stronger the left Tone: is normal and bulk is normal Sensation- Intact to light touch bilaterally. Extinction absent to light touch to DSS.   Coordination: FTN intact bilaterally.No drift.  Gait- deferred  ASSESSMENT/PLAN  Acute Ischemic Infarct: Patchy left MCA territory infarcts s/p mechanical thrombectomy and TNK Etiology: Large vessel occlusion Code Stroke CT head No acute abnormality.  Dense calcification in  proximal left M1, chronic left frontal infarct ASPECTS 10.    CTA head & neck distal left M1 segment and proximal superior and inferior M2 segments are occluded MRI patchy left MCA territory infarcts 2D Echo ejection fraction 60 to 65%.  Bubble study negative.  Left atrial size normal. LDL 63 HgbA1c 6.7 VTE prophylaxis -SCDs No antithrombotic prior to admission, now on No antithrombotic as she is less than 24 hours from TNK administration then aspirin and Plavix for 3 months followed by aspirin Therapy recommendations:   CIR Disposition: Pending  Hx of Stroke/TIA Old left frontal lobe infarct seen on CT  Hypertension Home meds: Lisinopril 20 mg daily, metoprolol 50 mg twice daily Stable Blood pressure goal less than 180  Hyperlipidemia Home meds: Simvastatin 20 mg daily, resumed in hospital LDL 63, goal < 70 High intensity statin not indicated as LDL below goal Continue statin at discharge  Diabetes type II Controlled Home meds: Glipizide 10 mg twice daily, insulin glargine 48 units daily, metformin 1000 mg twice daily HgbA1c 6.7, goal < 7.0 CBGs SSI Recommend close follow-up with PCP for better DM control  Dysphagia Patient has post-stroke dysphagia, SLP consulted    Diet   Diet Carb Modified Fluid consistency: Thin; Room service appropriate? Yes with Assist   Advance diet as tolerated    Hospital day # 3  Patient seen and examined by NP/APP with MD. MD to update note as needed.   Elmer Picker, DNP, FNP-BC [facility] Pager: [phone] I have personally obtained history,examined this patient, reviewed notes, independently viewed imaging studies, participated in medical decision making and plan of care.ROS completed by me personally and pertinent positives fully documented  I have made any additions or clarifications directly to the above note. Agree with note above.  Patient is neurologically stable and improving.  Continue ongoing therapies and mobilize out of bed.  Transfer to inpatient rehab.  Aspirin and Plavix for 3 months followed by aspirin alone and aggressive risk factor modification.  Long discussion with patient and family at the bedside and answered questions.  Greater than 50% time during this 50-minute visit was spent on counseling and coordination of care and discussion with patient and family and care team and answering questions.  Delia Heady, MD Medical Director [facility] Pager: [phone] 09/03/2023 3:42 PM   To contact Stroke  Continuity provider, please refer to [email or web address]. After hours, contact General Neurology

## 2023-09-03 NOTE — Progress Notes (Signed)
Inpatient Rehabilitation Admissions Coordinator   I will place rehab consult to assess for candidacy for possible Cir admit. CIR beds limited this week so other AIR rehab venues may need to be pursued.   Ottie Glazier, RN, MSN Rehab Admissions Coordinator [phone] 09/03/2023 1:44 PM

## 2023-09-04 ENCOUNTER — Encounter (HOSPITAL_COMMUNITY): Payer: Self-pay | Admitting: Neurology

## 2023-09-04 DIAGNOSIS — G8191 Hemiplegia, unspecified affecting right dominant side: Secondary | ICD-10-CM

## 2023-09-04 DIAGNOSIS — I639 Cerebral infarction, unspecified: Secondary | ICD-10-CM | POA: Diagnosis not present

## 2023-09-04 LAB — GLUCOSE, CAPILLARY
Glucose-Capillary: 268 mg/dL — ABNORMAL HIGH (ref 70–99)
Glucose-Capillary: 294 mg/dL — ABNORMAL HIGH (ref 70–99)
Glucose-Capillary: 242 mg/dL — ABNORMAL HIGH (ref 70–99)
Glucose-Capillary: 217 mg/dL — ABNORMAL HIGH (ref 70–99)
Glucose-Capillary: 248 mg/dL — ABNORMAL HIGH (ref 70–99)
Glucose-Capillary: 190 mg/dL — ABNORMAL HIGH (ref 70–99)
Glucose-Capillary: 265 mg/dL — ABNORMAL HIGH (ref 70–99)
Glucose-Capillary: 288 mg/dL — ABNORMAL HIGH (ref 70–99)

## 2023-09-04 MED ORDER — ROSUVASTATIN CALCIUM 20 MG PO TABS
20.0000 mg | ORAL_TABLET | Freq: Every day | ORAL | Status: DC
  Administered 2023-09-05 – 2023-09-06 (×2): 20 mg via ORAL
  Filled 2023-09-04 (×2): qty 1

## 2023-09-04 MED ORDER — BISACODYL 10 MG RE SUPP: 10.0000 mg | Freq: Once | RECTAL | Status: DC

## 2023-09-04 MED ORDER — FLEET ENEMA RE ENEM
1.0000 | ENEMA | Freq: Every day | RECTAL | Status: DC | PRN
  Filled 2023-09-04: qty 1

## 2023-09-04 MED ORDER — INSULIN GLARGINE-YFGN 100 UNIT/ML ~~LOC~~ SOLN
24.0000 [IU] | Freq: Every day | SUBCUTANEOUS | Status: DC
  Administered 2023-09-04 – 2023-09-05 (×2): 24 [IU] via SUBCUTANEOUS
  Filled 2023-09-04 (×2): qty 0.24

## 2023-09-04 MED ORDER — DOCUSATE SODIUM 100 MG PO CAPS
100.0000 mg | ORAL_CAPSULE | Freq: Every day | ORAL | Status: DC | PRN
  Administered 2023-09-04 – 2023-09-05 (×2): 100 mg via ORAL
  Filled 2023-09-04 (×2): qty 1

## 2023-09-04 MED ORDER — BISACODYL 10 MG RE SUPP
10.0000 mg | Freq: Once | RECTAL | Status: AC
  Administered 2023-09-04: 10 mg via RECTAL
  Filled 2023-09-04: qty 1

## 2023-09-04 NOTE — Plan of Care (Signed)
Problem: Education: Goal: Knowledge of disease or condition will improve Outcome: Progressing Goal: Knowledge of secondary prevention will improve (MUST DOCUMENT ALL) Outcome: Progressing Goal: Knowledge of patient specific risk factors will improve [place] N/A or DELETE if not current risk factor) Outcome: Progressing   Problem: Ischemic Stroke/TIA Tissue Perfusion: Goal: Complications of ischemic stroke/TIA will be minimized Outcome: Progressing   Problem: Coping: Goal: Will verbalize positive feelings about self Outcome: Progressing Goal: Will identify appropriate support needs Outcome: Progressing   Problem: Health Behavior/Discharge Planning: Goal: Ability to manage health-related needs will improve Outcome: Progressing Goal: Goals will be collaboratively established with patient/family Outcome: Progressing   Problem: Self-Care: Goal: Ability to participate in self-care as condition permits will improve Outcome: Progressing Goal: Verbalization of feelings and concerns over difficulty with self-care will improve Outcome: Progressing Goal: Ability to communicate needs accurately will improve Outcome: Progressing   Problem: Nutrition: Goal: Risk of aspiration will decrease Outcome: Progressing Goal: Dietary intake will improve Outcome: Progressing   Problem: Education: Goal: Knowledge of General Education information will improve Description: Including pain rating scale, medication(s)/side effects and non-pharmacologic comfort measures Outcome: Progressing   Problem: Health Behavior/Discharge Planning: Goal: Ability to manage health-related needs will improve Outcome: Progressing   Problem: Clinical Measurements: Goal: Ability to maintain clinical measurements within normal limits will improve Outcome: Progressing Goal: Will remain free from infection Outcome: Progressing Goal: Diagnostic test results will improve Outcome: Progressing Goal: Respiratory  complications will improve Outcome: Progressing Goal: Cardiovascular complication will be avoided Outcome: Progressing   Problem: Activity: Goal: Risk for activity intolerance will decrease Outcome: Progressing   Problem: Nutrition: Goal: Adequate nutrition will be maintained Outcome: Progressing   Problem: Coping: Goal: Level of anxiety will decrease Outcome: Progressing   Problem: Elimination: Goal: Will not experience complications related to bowel motility Outcome: Progressing Goal: Will not experience complications related to urinary retention Outcome: Progressing   Problem: Pain Managment: Goal: General experience of comfort will improve Outcome: Progressing   Problem: Safety: Goal: Ability to remain free from injury will improve Outcome: Progressing   Problem: Skin Integrity: Goal: Risk for impaired skin integrity will decrease Outcome: Progressing   Problem: Education: Goal: Ability to describe self-care measures that may prevent or decrease complications (Diabetes Survival Skills Education) will improve Outcome: Progressing Goal: Individualized Educational Video(s) Outcome: Progressing   Problem: Coping: Goal: Ability to adjust to condition or change in health will improve Outcome: Progressing   Problem: Fluid Volume: Goal: Ability to maintain a balanced intake and output will improve Outcome: Progressing   Problem: Health Behavior/Discharge Planning: Goal: Ability to identify and utilize available resources and services will improve Outcome: Progressing Goal: Ability to manage health-related needs will improve Outcome: Progressing   Problem: Metabolic: Goal: Ability to maintain appropriate glucose levels will improve Outcome: Progressing   Problem: Nutritional: Goal: Maintenance of adequate nutrition will improve Outcome: Progressing Goal: Progress toward achieving an optimal weight will improve Outcome: Progressing   Problem: Skin  Integrity: Goal: Risk for impaired skin integrity will decrease Outcome: Progressing   Problem: Tissue Perfusion: Goal: Adequacy of tissue perfusion will improve Outcome: Progressing   Problem: Education: Goal: Understanding of CV disease, CV risk reduction, and recovery process will improve Outcome: Progressing Goal: Individualized Educational Video(s) Outcome: Progressing   Problem: Activity: Goal: Ability to return to baseline activity level will improve Outcome: Progressing   Problem: Cardiovascular: Goal: Ability to achieve and maintain adequate cardiovascular perfusion will improve Outcome: Progressing Goal: Vascular access site(s) Level 0-1 will be maintained Outcome: Progressing  Problem: Health Behavior/Discharge Planning: Goal: Ability to safely manage health-related needs after discharge will improve Outcome: Progressing   

## 2023-09-04 NOTE — Consult Note (Signed)
Physical Medicine and Rehabilitation Consult Reason for Consult:right sided weakness after stroke Referring Physician: Pearlean Brownie   HPI: Molly [name] is a 82 y.o. female with a history of DM2, nonalcoholic fatty liver disease, HTN, cardiac arrhythmia who presented on 08/31/23 with right sided weakness and word finding deficits. CTA revealed left M1 and M2 MCA occlusion. Pt received TNK and was transferred to [facility] for mechanical thrombectomy. MRI was performed and revealed patchy left MCA infarcts. 2d echo and bubble study were unremarkable. Pt was placed on ASA and plavix for 3 months and then asa alone. Pt was last seen by PT on 10/23 and was min assist sit-std transfers and min assist for 225' using RW, shuffling gait, rightward drift. Pt lives at home with spouse in a level home with 3 steps to enter. Pt was independent PTA but had fallen recently.    Review of Systems  Constitutional: Negative.   HENT:  Negative for hearing loss and tinnitus.   Eyes:  Negative for blurred vision and double vision.  Respiratory:  Negative for cough.   Cardiovascular:  Positive for leg swelling. Negative for chest pain.  Gastrointestinal:  Negative for heartburn.  Genitourinary:  Positive for urgency.  Musculoskeletal:  Positive for falls. Negative for myalgias.  Skin: Negative.   Neurological:  Positive for focal weakness. Negative for sensory change, speech change and headaches.  Psychiatric/Behavioral:  Negative for depression.    Past Medical History:  Diagnosis Date   Arrhythmia    Arthritis    Basal cell carcinoma    right neck, right forehead - treated in the past   Cancer (HCC)    skin   CMV (cytomegalovirus infection) (HCC)    Colon polyps    Diabetes mellitus, type 2 (HCC)    Duodenitis    Dysrhythmia    Fatty liver disease, nonalcoholic    FH: colonic polyps    hx of-adenomatous   GERD (gastroesophageal reflux disease)    Heart murmur    dx'd in 1960s. followed by PCP    Hemorrhoid .   Hyperlipidemia    Hypertension    IBS (irritable bowel syndrome)    Osteoporosis    Osteoporosis    Pancreatitis    drug induced   Past Surgical History:  Procedure Laterality Date   ABDOMINAL HYSTERECTOMY     Carotids  3/08   mild only   CATARACT EXTRACTION W/PHACO Right 06/29/2017   Procedure: CATARACT EXTRACTION PHACO AND INTRAOCULAR LENS PLACEMENT (IOC)  Toric Diabetic Right;  Surgeon: Nevada Crane, MD;  Location: [facility];  Service: Ophthalmology;  Laterality: Right;  diabetic - oral meds   CATARACT EXTRACTION W/PHACO Right 07/27/2017   Procedure: CATARACT EXTRACTION PHACO AND INTRAOCULAR LENS PLACEMENT (IOC) LEFT DIABETIC TORIC;  Surgeon: Nevada Crane, MD;  Location: [facility];  Service: Ophthalmology;  Laterality: Right;  Diabetic - oral meds   COLONOSCOPY     COLONOSCOPY WITH PROPOFOL N/A 07/20/2019   Procedure: COLONOSCOPY WITH PROPOFOL;  Surgeon: Toledo, Boykin Nearing, MD;  Location: [facility] ENDOSCOPY;  Service: Gastroenterology;  Laterality: N/A;   cystocele, cystourethropexy  12/87   dexa     increase T - 3.1 spine, normal femur 1/04   ESOPHAGOGASTRODUODENOSCOPY     ESOPHAGOGASTRODUODENOSCOPY (EGD) WITH PROPOFOL N/A 04/16/2018   Procedure: ESOPHAGOGASTRODUODENOSCOPY (EGD) WITH PROPOFOL;  Surgeon: Scot Jun, MD;  Location: [facility];  Service: Endoscopy;  Laterality: N/A;   ESOPHAGOGASTRODUODENOSCOPY (EGD) WITH PROPOFOL N/A 07/20/2019  Procedure: ESOPHAGOGASTRODUODENOSCOPY (EGD) WITH PROPOFOL;  Surgeon: Toledo, Boykin Nearing, MD;  Location: [facility] ENDOSCOPY;  Service: Gastroenterology;  Laterality: N/A;   EYE SURGERY     hysterectomy (other)  1980   IR CT HEAD LTD  08/31/2023   IR PERCUTANEOUS ART THROMBECTOMY/INFUSION INTRACRANIAL INC DIAG ANGIO  08/31/2023   IR US GUIDE VASC ACCESS RIGHT  08/31/2023   RADIOLOGY WITH ANESTHESIA N/A 08/31/2023   Procedure: IR WITH ANESTHESIA;  Surgeon: Radiologist, Medication, MD;  Location: [facility]  OR;  Service: Radiology;  Laterality: N/A;   TUBAL LIGATION  1978   vaginal deliveries     x2   Family History  Problem Relation Age of Onset   Heart failure Mother    Coronary artery disease Mother    Heart attack Mother 58   Diabetes Mother    Heart failure Father    Coronary artery disease Father    Breast cancer Neg Hx    Social History:  reports that she quit smoking about 32 years ago. Her smoking use included cigarettes. She started smoking about 52 years ago. She has never used smokeless tobacco. She reports that she does not currently use alcohol. She reports that she does not use drugs. Allergies:  Allergies  Allergen Reactions   Alendronate Nausea Only   Codeine Nausea Only   Lansoprazole     REACTION: hurts stomach   Sulfonamide Derivatives     Childhood reaction (doesn't remember)   Adhesive [Tape] Rash    Blisters if on too long   Medications Prior to Admission  Medication Sig Dispense Refill   Biotin 2500 MCG CAPS Take by mouth 2 (two) times daily.     CALCIUM PO Take 750 mg by mouth 2 (two) times daily.     Cholecalciferol (VITAMIN D PO) Take 500 Units by mouth 2 (two) times daily.     Cyanocobalamin (VITAMIN B 12 PO) Take 1 tablet by mouth daily.     glipiZIDE (GLUCOTROL XL) 10 MG 24 hr tablet Take 10 mg by mouth 2 (two) times daily.     HYDROmorphone (DILAUDID) 4 MG tablet Take 4 mg by mouth every 6 (six) hours as needed for moderate pain (pain score 4-6).     ibuprofen (ADVIL) 200 MG tablet Take 200 mg by mouth every 6 (six) hours as needed for mild pain (pain score 1-3).     insulin glargine (LANTUS SOLOSTAR) 100 UNIT/ML Solostar Pen TAKE 48 UNITS ONCE DAILY. TAKE EACH MORNING.     ketoconazole (NIZORAL) 2 % cream Apply to skin folds once daily, twice daily with flares. (Patient taking differently: Apply 1 Application topically daily as needed for irritation.) 60 g 5   lisinopril (ZESTRIL) 20 MG tablet Take 20 mg by mouth daily.     magnesium oxide (MAG-OX)  400 MG tablet Take 1 tablet by mouth daily.     metFORMIN (GLUCOPHAGE-XR) 500 MG 24 hr tablet Take 1,000 mg by mouth 2 (two) times daily.  1   metoprolol tartrate (LOPRESSOR) 50 MG tablet Take 50 mg by mouth 2 (two) times daily.     pantoprazole (PROTONIX) 40 MG tablet Take 40 mg by mouth 2 (two) times daily.     promethazine (PHENERGAN) 25 MG tablet Take 25 mg by mouth every 8 (eight) hours as needed for vomiting or nausea.     simvastatin (ZOCOR) 20 MG tablet Take 0.5 tablets (10 mg total) by mouth at bedtime. (Patient taking differently: Take 20 mg by mouth at bedtime.) 15  tablet 0   glucose blood test strip 1 each by Other route as directed. Use as instructed     Lancet Devices (ACCU-CHEK SOFTCLIX) lancets 1 each by Other route as needed. Use as instructed     pimecrolimus (ELIDEL) 1 % cream Apply to buttocks area twice daily until improved. (Patient not taking: Reported on 08/31/2023) 30 g 0   potassium chloride (KLOR-CON) 20 MEQ packet Take by mouth 2 (two) times daily. (Patient not taking: Reported on 08/31/2023)      Home: Home Living Family/patient expects to be discharged to:: Private residence Living Arrangements: Spouse/significant other Available Help at Discharge: Family, Available 24 hours/day Type of Home: House Home Access: Stairs to enter [employer] of Steps: 3 Entrance Stairs-Rails: Right, Left Home Layout: One level Bathroom Shower/Tub: Health visitor: Standard Bathroom Accessibility: Yes Home Equipment: Shower seat, Grab bars - toilet, Grab bars - tub/shower, Hand held shower head, Agricultural consultant (2 wheels), [employer] - single point  Lives With: Spouse  Functional History: Prior Function Prior Level of Function : Independent/Modified Independent, Driving Mobility Comments: 1 falls on vacation ~2 months ago ADLs Comments: driving when she wanted to, spouse food shops, Functional Status:  Mobility: Bed Mobility Overal bed mobility: Needs  Assistance Bed Mobility: Supine to Sit, Sit to Supine, Rolling Rolling: Used rails, Min assist Supine to sit: Max assist, Used rails, HOB elevated Sit to supine: Max assist, +2 for safety/equipment General bed mobility comments: Cues to use bed rail and sequence reaching Lt UE across to pivot/turn trunk. Min assist to fully raise trunk and scoot to edge. Transfers Overall transfer level: Needs assistance Equipment used: Rolling walker (2 wheels) Transfers: Sit to/from Stand, Bed to chair/wheelchair/BSC Sit to Stand: Min assist Bed to/from chair/wheelchair/BSC transfer type:: Step pivot Step pivot transfers: Min assist General transfer comment: cues for hand placement to rise to RW, min assist for power up and pt steady once achieved upright posture with bil UE support on RW. Min assist to guide turn for bed>chair with RW. Ambulation/Gait Ambulation/Gait assistance: Min assist Gait Distance (Feet): 225 Feet Assistive device: Rolling walker (2 wheels), 1 person hand held assist Gait Pattern/deviations: Step-through pattern, Decreased stride length, Shuffle, Decreased dorsiflexion - right, Drifts right/left General Gait Details: Min assist with RW, cues at start for position to RW. Pt progressing towards CGA for straight gait. Min assist needed to stabilize balance with turns. Pt challenged w/ dual task to count backwards from 10 and abel to perform cog task. Gait speed slowed with cog task and pt drifting Rt requiring assist to stabilize and avoid walking towards wall. Gait velocity: decr    ADL: ADL Overall ADL's : Needs assistance/impaired Eating/Feeding: Maximal assistance Grooming: Modified independent Upper Body Bathing: Maximal assistance Lower Body Bathing: Total assistance Upper Body Dressing : Maximal assistance Lower Body Dressing: Total assistance  Cognition: Cognition Overall Cognitive Status: Impaired/Different from baseline Arousal/Alertness: Awake/alert Orientation  Level: Oriented X4 Attention: Sustained Sustained Attention: Impaired Sustained Attention Impairment: Verbal basic Memory: Impaired Memory Impairment: Retrieval deficit Awareness: Impaired Awareness Impairment: Emergent impairment Problem Solving: Impaired Problem Solving Impairment: Verbal basic Cognition Arousal: Alert Behavior During Therapy: WFL for tasks assessed/performed Overall Cognitive Status: Impaired/Different from baseline Area of Impairment: Memory, Safety/judgement, Awareness, Problem solving, Following commands Memory: Decreased short-term memory Following Commands: Follows one step commands consistently, Follows multi-step commands inconsistently Problem Solving: Slow processing, Decreased initiation, Requires verbal cues, Difficulty sequencing, Requires tactile cues  Blood pressure (!) 143/46, pulse 78, temperature (!) 97.5 F (  36.4 C), temperature source Oral, resp. rate 15, SpO2 95%. Physical Exam Constitutional:      Appearance: She is obese.  HENT:     Head: Normocephalic.     Right Ear: External ear normal.     Left Ear: External ear normal.     Nose: Nose normal.  Eyes:     Extraocular Movements: Extraocular movements intact.     Pupils: Pupils are equal, round, and reactive to light.  Cardiovascular:     Rate and Rhythm: Normal rate and regular rhythm.  Pulmonary:     Effort: Pulmonary effort is normal.  Abdominal:     General: Bowel sounds are normal.  Musculoskeletal:     Cervical back: Normal range of motion.     Right lower leg: Edema present.     Left lower leg: Edema present.  Skin:    General: Skin is warm.     Findings: Bruising present.  Neurological:     Mental Status: She is alert.     Comments: Pt is alert and oriented x3. Follows basic commands. Occasional word finding deficits in conversation. Mild right central 7. Normal gag. Speech clear. Good phonation. Fair insight. A little impulsive. Mild right pronator drift. Motor 4+/5 RUE  5/5 LUE. RLE 4/5 prox to distal and LLE 4+/5. Sensory exam normal for light touch and pain in all 4 limbs. No limb ataxia or cerebellar signs. No abnormal tone appreciated.  Ambulated with her and she listed to right, lost balance frequently to that direction.   Psychiatric:        Mood and Affect: Mood normal.        Behavior: Behavior normal.     Results for orders placed or performed during the hospital encounter of 08/31/23 (from the past 24 hour(s))  Glucose, capillary     Status: Abnormal   Collection Time: 09/03/23 10:56 AM  Result Value Ref Range   Glucose-Capillary 261 (H) 70 - 99 mg/dL  Glucose, capillary     Status: Abnormal   Collection Time: 09/03/23  5:12 PM  Result Value Ref Range   Glucose-Capillary 268 (H) 70 - 99 mg/dL  Glucose, capillary     Status: Abnormal   Collection Time: 09/03/23  7:24 PM  Result Value Ref Range   Glucose-Capillary 294 (H) 70 - 99 mg/dL  Glucose, capillary     Status: Abnormal   Collection Time: 09/03/23  9:07 PM  Result Value Ref Range   Glucose-Capillary 242 (H) 70 - 99 mg/dL  Glucose, capillary     Status: Abnormal   Collection Time: 09/04/23  7:15 AM  Result Value Ref Range   Glucose-Capillary 217 (H) 70 - 99 mg/dL   No results found.  Assessment/Plan: Diagnosis: 82 yo female s/p acute left MCA infarct due to large vessel occlusion with right hemiparesis and word finding deficits Does the need for close, 24 hr/day medical supervision in concert with the patient's rehab needs make it unreasonable for this patient to be served in a less intensive setting? Yes Co-Morbidities requiring supervision/potential complications:  -HTN -DM2 -Hyperlipidemia Due to bladder management, bowel management, safety, skin/wound care, disease management, medication administration, pain management, and patient education, does the patient require 24 hr/day rehab nursing? Yes and Potentially Does the patient require coordinated care of a physician, rehab  nurse, therapy disciplines of PT, OT, SLP to address physical and functional deficits in the context of the above medical diagnosis(es)? Yes and Potentially Addressing deficits in the following areas: balance,  endurance, locomotion, strength, transferring, bowel/bladder control, bathing, dressing, feeding, grooming, toileting, cognition, speech, and psychosocial support Can the patient actively participate in an intensive therapy program of at least 3 hrs of therapy per day at least 5 days per week? Yes The potential for patient to make measurable gains while on inpatient rehab is excellent Anticipated functional outcomes upon discharge from inpatient rehab are modified independent  with PT, modified independent with OT, modified independent and supervision with SLP. Estimated rehab length of stay to reach the above functional goals is: 5-7 days Anticipated discharge destination: Home Overall Rehab/Functional Prognosis: excellent  POST ACUTE RECOMMENDATIONS: This patient's condition is appropriate for continued rehabilitative care in the following setting:  CIR depending upon progress today/over weekend and bed availability Patient has agreed to participate in recommended program. Yes Note that insurance prior authorization may be required for reimbursement for recommended care.  Comment: Spoke with husband and son at length. Son to come up from [place] tomorrow. Rehab Admissions Coordinator to follow up.     I have personally performed a face to face diagnostic evaluation of this patient. Additionally, I have examined the patient's medical record including any pertinent labs and radiographic images. If the physician assistant has documented in this note, I have reviewed and edited or otherwise concur with the physician assistant's documentation.  Thanks,  Ranelle Oyster, MD 09/04/2023

## 2023-09-04 NOTE — Progress Notes (Signed)
Inpatient Rehab Admissions Coordinator:    I met with pt. And daughter to discuss potential CIR admit. They are interested and Pt.'s husband can provide 24/7 support at d/c. Rehab MD did consult and felt that Pt. Is a good candidate but may be too high level for CIR by  the time a bed is available. Son would like me to discuss with him tomorrow.   Megan Salon, MS, CCC-SLP Rehab Admissions Coordinator  [phone] (celll) [phone] (office)

## 2023-09-04 NOTE — Progress Notes (Addendum)
STROKE TEAM PROGRESS NOTE   BRIEF HPI [name] is a 82 y.o. female with history of type 2 diabetes, nonalcoholic fatty liver disease, GERD, hypertension, hyperlipidemia and nonspecific cardiac arrhythmia presenting with acute onset right-sided weakness and aphasia.  She was found to have a left M1 MCA occlusion.  She was given TNK at [facility] and transferred here for mechanical thrombectomy.  This was successful with TICI 2B recanalization achieved. 30 day heart monitor requested and will be sent to patients home. She will follow up outpatient with Dr. Royann Shivers in 6-8 weeks for results.    SIGNIFICANT HOSPITAL EVENTS 10/21: TNK given and mechanical thrombectomy performed with TICI 2B recanalization 10/24: Transfer out of ICU  INTERIM HISTORY/SUBJECTIVE Awaiting CIR placement. Constipation reported today- enema and suppository ordered per patient request. Transfer orders placed, waiting for bed on the floor. Reports 90% back to baseline. Still has word finding difficulties when she is tired. Increasing Semglee today  OBJECTIVE  CBC    Component Value Date/Time   WBC 14.2 (H) 09/01/2023 0656   RBC 3.77 (L) 09/01/2023 0656   HGB 11.9 (L) 09/01/2023 0656   HCT 35.4 (L) 09/01/2023 0656   PLT 222 09/01/2023 0656   MCV 93.9 09/01/2023 0656   MCH 31.6 09/01/2023 0656   MCHC 33.6 09/01/2023 0656   RDW 13.9 09/01/2023 0656   LYMPHSABS 2.3 08/31/2023 0930   MONOABS 0.6 08/31/2023 0930   EOSABS 0.2 08/31/2023 0930   BASOSABS 0.0 08/31/2023 0930    BMET    Component Value Date/Time   NA 140 09/01/2023 0656   NA 143 02/10/2023 1503   K 3.7 09/01/2023 0656   CL 105 09/01/2023 0656   CO2 24 09/01/2023 0656   GLUCOSE 131 (H) 09/01/2023 0656   BUN 11 09/01/2023 0656   BUN 20 02/10/2023 1503   CREATININE 0.72 09/01/2023 0656   CALCIUM 8.3 (L) 09/01/2023 0656   EGFR 67 02/10/2023 1503   GFRNONAA >60 09/01/2023 0656    IMAGING past 24 hours No results found.  Vitals:   09/04/23  0400 09/04/23 0500 09/04/23 0600 09/04/23 0700  BP: (!) 153/52 (!) 158/58 (!) 167/57 (!) 158/60  Pulse: (!) 55 (!) 56 71 67  Resp: 17 17 15 16   Temp: 97.8 F (36.6 C)     TempSrc: Axillary     SpO2: 96% 93% 96% 92%     PHYSICAL EXAM General:  Alert, well-nourished, well-developed elderly patient in no acute distress Psych:  Mood and affect appropriate for situation CV: Regular rate and rhythm on monitor Respiratory:  Regular, unlabored respirations on room air   NEURO:  Mental Status: AA&Ox3, able to follow single step and multistep commands Speech/Language: speech is with minimal expressive aphasia and able to speak sentences. Comprehension is intact.  Cranial Nerves:  II: PERRL. Visual fields full.  III, IV, VI: EOMI. Eyelids elevate symmetrically.  V: Sensation is intact to light touch and symmetrical to face.  VII: Face is symmetrical resting and smiling VIII: hearing intact to voice. IX, X: Phonation is normal.  XII: tongue is midline without fasciculations. Motor: Able to move all 4 extremities with antigravity strength, right stronger the left Tone: is normal and bulk is normal Sensation- Intact to light touch bilaterally. Extinction absent to light touch to DSS.   Coordination: FTN intact bilaterally.No drift.  Gait- deferred  ASSESSMENT/PLAN  Acute Ischemic Infarct: Patchy left MCA territory infarcts s/p mechanical thrombectomy and TNK Etiology: Large vessel occlusion Code Stroke CT head  No acute abnormality.  Dense calcification in proximal left M1, chronic left frontal infarct ASPECTS 10.    CTA head & neck distal left M1 segment and proximal superior and inferior M2 segments are occluded MRI patchy left MCA territory infarcts 2D Echo ejection fraction 60 to 65%.  Bubble study negative.  Left atrial size normal. LDL 63 HgbA1c 6.7 VTE prophylaxis -SCDs No antithrombotic prior to admission, now on aspirin and Plavix for 3 months followed by aspirin Therapy  recommendations:  CIR Disposition: Med Tele   Hx of Stroke/TIA Old left frontal lobe infarct seen on CT  Hypertension Home meds: Lisinopril 20 mg daily, metoprolol 50 mg twice daily Stable Blood pressure goal less than 180  Hyperlipidemia Home meds: Simvastatin 20 mg daily, resumed in hospital LDL 63, goal < 70 High intensity statin not indicated as LDL below goal Continue statin at discharge  Diabetes type II Controlled Home meds: Glipizide 10 mg twice daily, insulin glargine 48 units daily, metformin 1000 mg twice daily HgbA1c 6.7, goal < 7.0 CBGs SSI - Increased 10/25 Recommend close follow-up with PCP for better DM control  Dysphagia Patient has post-stroke dysphagia, SLP consulted    Diet   Diet Carb Modified Fluid consistency: Thin; Room service appropriate? Yes with Assist   Advance diet as tolerated  Constipation Fleet enema provided and suppository PRN   Hospital day # 4  Patient seen and examined by NP/APP with MD. MD to update note as needed.   Elmer Picker, DNP, FNP-BC [facility] Pager: [phone]  I have personally obtained history,examined this patient, reviewed notes, independently viewed imaging studies, participated in medical decision making and plan of care.ROS completed by me personally and pertinent positives fully documented  I have made any additions or clarifications directly to the above note. Agree with note above.  Patient neurological exam is improving.  Continue aspirin and Plavix for 3 months.  Aggressive risk factor modification.  Mobilize out of bed.  Therapy consults.  Transfer to neurology floor bed when available.  She will likely need inpatient rehab.  Discussed with family at the bedside and answered questions.  Greater than 50% time during this 50-minute visit was spent in counseling and coordination of care and discussion patient and family and care team and answering questions about his stroke and intracranial  stenosis.  Delia Heady, MD Medical Director [facility] Pager: [phone] 09/04/2023 2:24 PM   To contact Stroke Continuity provider, please refer to [email or web address]. After hours, contact General Neurology

## 2023-09-04 NOTE — Progress Notes (Signed)
Occupational Therapy Treatment Patient Details Name: Molly [name] MRN: [id number] DOB: 07-19-1941 Today's Date: 09/04/2023   History of present illness 82 yo female presenting with R side weakness and aphasia. Imaging + for L M1 MCA occlusion s/p TNK at [facility] with IR thrombectomy successful. PMH DM2, nonalcoholic fatty liver disease, GERD HTN HLD nonspecific cardiac arrhythmia L frontal lobe infarct.   OT comments  Pt demonstrates cognitive deficits and poor awareness to safety with RW at times during the session. Pt pushing RW to the side when at sink level and needed redirection for RW placement. Pt with multiple cognitive errors that family in the room reports was not baseline. Recommendation for >3 hours of therapy to reach mod I for return home with spouse.       If plan is discharge home, recommend the following:  A lot of help with bathing/dressing/bathroom;A little help with walking and/or transfers   Equipment Recommendations  BSC/3in1    Recommendations for Other Services Rehab consult    Precautions / Restrictions Precautions Precautions: Fall Precaution Comments: SBP <180       Mobility Bed Mobility Overal bed mobility: Needs Assistance Bed Mobility: Supine to Sit, Sit to Supine Rolling: Supervision   Supine to sit: Supervision Sit to supine: Supervision        Transfers Overall transfer level: Needs assistance Equipment used: Rolling walker (2 wheels) Transfers: Sit to/from Stand Sit to Stand: Contact guard assist                 Balance Overall balance assessment: Needs assistance         Standing balance support: No upper extremity supported, During functional activity Standing balance-Leahy Scale: Poor                 High Level Balance Comments: increased time to complete turns with RW and smaller steps           ADL either performed or assessed with clinical judgement   ADL Overall ADL's : Needs assistance/impaired      Grooming: Minimal assistance Grooming Details (indicate cue type and reason): pt needs cues to sequene the task                 Toilet Transfer: Contact guard assist   Toileting- Clothing Manipulation and Hygiene: Minimal assistance       Functional mobility during ADLs: Contact guard assist;Rolling walker (2 wheels) General ADL Comments: pt asking questions to sequence during session at times    Extremity/Trunk Assessment Upper Extremity Assessment Upper Extremity Assessment: Right hand dominant            Vision   Vision Assessment?: Vision impaired- to be further tested in functional context Additional Comments: pt walking into door frame with Rw on the right without awarness   Perception     Praxis      Cognition Arousal: Alert Behavior During Therapy: WFL for tasks assessed/performed Overall Cognitive Status: Impaired/Different from baseline Area of Impairment: Memory, Following commands, Safety/judgement, Awareness, Problem solving                     Memory: Decreased short-term memory Following Commands: Follows one step commands with increased time, Follows multi-step commands inconsistently Safety/Judgement: Decreased awareness of safety, Decreased awareness of deficits Awareness: Emergent Problem Solving: Slow processing, Decreased initiation, Requires verbal cues, Difficulty sequencing, Requires tactile cues General Comments: Pt needed redirection to recall 3 step command. pt walking in the bathroom door with RW and  no recogniziing the error. pt standing in the bathroom with the light off and needing cues to turn on the light. pt exiting hte bathroom and forgetting the second command to wash hands at sink. pt returning to the bathroom a second time and turning on the light but unable to locate the sink becuase the sink is outside the bathroom . the bathroom is a commode and walk in shower only. pt with no awarness that she passed the sink prior to  entry to the bathroom. pt asked if she would do anything differently in the session and she reports "no" despite the multiple errors. pt asked if she has fallen in the last month and pt reports no. the daughter in the room redirected patient with mother what about [place] trip. pt then laughs and states that doesnt count that was a speed bump then admits to a fall. daughter states pt is falling but not reporting them all.daughter reports cognition is not at baseline based on a session withe the verbal cue please go to the bathroom sit on the commode and then wash your hands.        Exercises      Shoulder Instructions       General Comments RA    Pertinent Vitals/ Pain       Pain Assessment Pain Assessment: No/denies pain  Home Living                                          Prior Functioning/Environment              Frequency  Min 1X/week        Progress Toward Goals  OT Goals(current goals can now be found in the care plan section)  Progress towards OT goals: Progressing toward goals  Acute Rehab OT Goals Patient Stated Goal: to be able to go home. daughter reports pt falling at home and family concerned with need to be stronger first OT Goal Formulation: With patient/family Time For Goal Achievement: 09/15/23 Potential to Achieve Goals: Good ADL Goals Pt Will Perform Lower Body Dressing: with mod assist;sit to/from stand Pt Will Transfer to Toilet: with mod assist;stand pivot transfer;bedside commode Pt/caregiver will Perform Home Exercise Program: Increased strength;Right Upper extremity;With minimal assist;With written HEP provided Additional ADL Goal #1: pt will complete bed mobility min (A as precursor to adls  Plan      Co-evaluation                 AM-PAC OT "6 Clicks" Daily Activity     Outcome Measure   Help from another person eating meals?: A Little Help from another person taking care of personal grooming?: A Little Help from  another person toileting, which includes using toliet, bedpan, or urinal?: A Lot Help from another person bathing (including washing, rinsing, drying)?: A Lot Help from another person to put on and taking off regular upper body clothing?: A Little Help from another person to put on and taking off regular lower body clothing?: A Lot 6 Click Score: 15    End of Session Equipment Utilized During Treatment: Gait belt;Rolling walker (2 wheels)  OT Visit Diagnosis: Unsteadiness on feet (R26.81);Muscle weakness (generalized) (M62.81)   Activity Tolerance Patient tolerated treatment well   Patient Left in bed;with call bell/phone within reach;with bed alarm set;with family/visitor present   Nurse Communication Mobility status;Precautions  Time: 9528-4132 OT Time Calculation (min): 28 min  Charges: OT General Charges $OT Visit: 1 Visit OT Treatments $Self Care/Home Management : 8-22 mins   Brynn, OTR/L  Acute Rehabilitation Services Office: [phone] .   Mateo Flow 09/04/2023, 8:12 PM

## 2023-09-05 DIAGNOSIS — I639 Cerebral infarction, unspecified: Secondary | ICD-10-CM | POA: Diagnosis not present

## 2023-09-05 LAB — GLUCOSE, CAPILLARY
Glucose-Capillary: 219 mg/dL — ABNORMAL HIGH (ref 70–99)
Glucose-Capillary: 225 mg/dL — ABNORMAL HIGH (ref 70–99)
Glucose-Capillary: 228 mg/dL — ABNORMAL HIGH (ref 70–99)
Glucose-Capillary: 113 mg/dL — ABNORMAL HIGH (ref 70–99)

## 2023-09-05 MED ORDER — INSULIN GLARGINE-YFGN 100 UNIT/ML ~~LOC~~ SOLN: 26.0000 [IU] | Freq: Every day | SUBCUTANEOUS | Status: DC

## 2023-09-05 MED ORDER — INSULIN GLARGINE-YFGN 100 UNIT/ML ~~LOC~~ SOLN
24.0000 [IU] | Freq: Every day | SUBCUTANEOUS | Status: DC
  Administered 2023-09-06: 24 [IU] via SUBCUTANEOUS
  Filled 2023-09-05: qty 0.24

## 2023-09-05 MED ORDER — METFORMIN HCL ER 500 MG PO TB24
1000.0000 mg | ORAL_TABLET | Freq: Two times a day (BID) | ORAL | Status: DC
  Administered 2023-09-05 – 2023-09-06 (×2): 1000 mg via ORAL
  Filled 2023-09-05 (×3): qty 2

## 2023-09-05 MED ORDER — GLIPIZIDE ER 10 MG PO TB24
10.0000 mg | ORAL_TABLET | Freq: Two times a day (BID) | ORAL | Status: DC
Start: 2023-09-05 — End: 2023-09-06
  Administered 2023-09-05 – 2023-09-06 (×3): 10 mg via ORAL
  Filled 2023-09-05 (×4): qty 1

## 2023-09-05 NOTE — TOC Transition Note (Signed)
Transition of Care [facility]) - CM/SW Discharge Note   Patient Details  Name: Molly [name] MRN: [id number] Date of Birth: 10-08-1941  Transition of Care [facility]) CM/SW Contact:  Lawerance Sabal, RN Phone Number: 09/05/2023, 3:45 PM   Clinical Narrative:     [name] w patient and daughter at bedside. They are aware that CIR declined due to high level of function. They are agreeable to [facility] services, no preference for provider. Frances Furbish accepted referral. Patient has walker, declined 3/1.  Updated provider to request for [facility] orders.   Final next level of care: Home w Home Health Services Barriers to Discharge: No Barriers Identified   Patient Goals and CMS Choice      Discharge Placement                         Discharge Plan and Services Additional resources added to the After Visit Summary for   In-house Referral: Clinical Social Work                          [facility] Agency: [facility] Date [facility] Agency Contacted: 09/05/23 Time HH Agency Contacted: 1545 Representative spoke with at [facility] Agency: Kandee Keen  Social Determinants of Health (SDOH) Interventions SDOH Screenings   Food Insecurity: No Food Insecurity (09/04/2023)  Housing: Patient Declined (09/04/2023)  Transportation Needs: No Transportation Needs (09/04/2023)  Utilities: Not At Risk (09/04/2023)  Depression (PHQ2-9): Low Risk  (02/10/2023)  Tobacco Use: Medium Risk (09/04/2023)     Readmission Risk Interventions     No data to display

## 2023-09-05 NOTE — Progress Notes (Addendum)
STROKE TEAM PROGRESS NOTE   BRIEF HPI [name] is a 82 y.o. female with history of type 2 diabetes, nonalcoholic fatty liver disease, GERD, hypertension, hyperlipidemia and nonspecific cardiac arrhythmia presenting with acute onset right-sided weakness and aphasia.  She was found to have a left M1 MCA occlusion.  She was given TNK at [facility] and transferred here for mechanical thrombectomy.  This was successful with TICI 2B recanalization achieved. 30 day heart monitor requested and will be sent to patients home. She will follow up outpatient with Dr. Royann Shivers in 6-8 weeks for results.    SIGNIFICANT HOSPITAL EVENTS 10/21: TNK given and mechanical thrombectomy performed with TICI 2B recanalization 10/24: Transfer out of ICU  INTERIM HISTORY/SUBJECTIVE -BGL still out of control, restart home meds today and increased glargine yesterday -constipated with only small BM today, urged patient to allow [place] to escalate bowel regimen but she politely declines, is willing to try prune juice for now  -husband gives additional history of preceding neck pain but no heavy lifting prior to event by 1 day , but no dissection noted on most recent CTA  -patient now recommended for home with therapy, outpatient versus home to be determined  OBJECTIVE CBC    Component Value Date/Time   WBC 14.2 (H) 09/01/2023 0656   RBC 3.77 (L) 09/01/2023 0656   HGB 11.9 (L) 09/01/2023 0656   HCT 35.4 (L) 09/01/2023 0656   PLT 222 09/01/2023 0656   MCV 93.9 09/01/2023 0656   MCH 31.6 09/01/2023 0656   MCHC 33.6 09/01/2023 0656   RDW 13.9 09/01/2023 0656   LYMPHSABS 2.3 08/31/2023 0930   MONOABS 0.6 08/31/2023 0930   EOSABS 0.2 08/31/2023 0930   BASOSABS 0.0 08/31/2023 0930    BMET    Component Value Date/Time   NA 140 09/01/2023 0656   NA 143 02/10/2023 1503   K 3.7 09/01/2023 0656   CL 105 09/01/2023 0656   CO2 24 09/01/2023 0656   GLUCOSE 131 (H) 09/01/2023 0656   BUN 11 09/01/2023 0656   BUN 20  02/10/2023 1503   CREATININE 0.72 09/01/2023 0656   CALCIUM 8.3 (L) 09/01/2023 0656   EGFR 67 02/10/2023 1503   GFRNONAA >60 09/01/2023 0656   IMAGING past 24 hours No results found.  Vitals:   09/04/23 2342 09/05/23 0319 09/05/23 0833 09/05/23 1112  BP: (!) 121/44 (!) 131/52 (!) 152/57 (!) 139/58  Pulse: 61 66 76 61  Resp: 17 18 19 17   Temp: 97.8 F (36.6 C) (!) 97.5 F (36.4 C) 98.5 F (36.9 C) 98 F (36.7 C)  TempSrc: Oral  Oral Oral  SpO2: 94% 91% 96% 97%  Weight:      Height:       PHYSICAL EXAM General:  Alert, well-nourished, well-developed elderly patient in no acute distress Psych:  Mood and affect appropriate for situation CV: Regular rate and rhythm on monitor Respiratory:  Regular, unlabored respirations on room air  NEURO:  Mental Status: AA&Ox3, able to follow single step and multistep commands Speech/Language: speech is with now improved expressive aphasia but with paucity of speech/ bradyphrenia, able to speak sentences. Comprehension, naming, repetition is intact.  Cranial Nerves:  II: PERRL. Visual fields full.  III, IV, VI: EOMI. Eyelids elevate symmetrically.  V: Sensation is intact to light touch and symmetrical to face.  VII: Face is symmetrical resting and smiling VIII: hearing intact to voice. IX, X: Phonation is normal.  XII: tongue is midline without fasciculations. Motor: 5/5  strength except right hip flex 4/5  Tone: is normal and bulk is normal Sensation- Intact to light touch bilaterally. Extinction absent to light touch to DSS.   Coordination: FTN intact bilaterally.No drift.  Gait- deferred  ------  ASSESSMENT/PLAN  Acute Ischemic Infarct: Patchy left MCA territory and bilateral ACA scattered infarcts left MCA occlusion s/p TNK and IR with TICI 2B, etiology: Large vessel occlusion versus cardioembolic source Code Stroke CT head No acute abnormality.  Dense calcification in proximal left M1, chronic left frontal infarct ASPECTS 10.     CTA head & neck distal left M1 segment and proximal superior and inferior M2 segments are occluded S/p IR with left M2 and multiple M3 occlusion with TICI2b reperfusion MRI patchy left MCA territory and bilateral ACA scattered infarcts CTA head and neck repeat showed left MCA patent 2D Echo EF 60 to 65%.  Bubble study negative.  Left atrial size normal. 30 day heart monitor requested and will be sent to patients home LDL 63 HgbA1c 6.7 VTE prophylaxis -SCDs + lovenox 40  No antithrombotic prior to admission, now on aspirin and Plavix for 3 months followed by aspirin alone  Therapy recommendations:  CIR initially, now rec'd for home with home health Disposition: Pending  ? Cardiac arrhythmia Has diagnosis of cardiac arrhythmia on medical problems list However no documented cardiac arrhythmia Recommend 30-day CardioNet monitoring as outpatient to rule out A-fib  Hypertension Home meds: Lisinopril 20 mg daily, metoprolol 50 mg twice daily Stable On home lisinopril 20 and metoprolol 50 twice daily Blood pressure goal less than 180 Long-term BP goal normotensive  Hyperlipidemia Home meds: rosuvastatin 20 mg daily, resumed in hospital LDL 63, goal < 70 Continue statin at discharge  Diabetes type II Controlled Home meds: Glipizide 10 mg twice daily, insulin glargine 48 units daily, metformin 1000 mg twice daily Restart glipizide and metformin 10/26 Only 24 untis glargine, low threshold to increase  HgbA1c 6.7, goal < 7.0 CBGs SSI Recommend close follow-up with PCP for better DM control  Other stroke risk factors Advanced age Hx of Stroke/TIA - Old left frontal lobe infarct seen on CT  Other acute issues Constipation, Fleet enema provided and suppository PRN Leukocytosis WBC 14.2-> pending  Sanjuana Letters, PA-C Neurology Pager # [phone]   ATTENDING NOTE: I reviewed above note and agree with the assessment and plan. Pt was seen and examined.   Daughter at bedside.   Patient reclining in bed, neurologically intact, no acute event overnight, not in distress.  No focal deficit.  Patient stated that she is not aware of cardiac arrhythmia diagnosis.  Denies history of A-fib.  Will recommend 30-day cardiac event monitoring as outpatient to rule out A-fib.  Continue DAPT for 3 months and then aspirin alone.  Resume home diabetic medication and continue insulin.  BP stable on lisinopril 20 and metoprolol 50 twice daily.  Continue statin.  PT and OT not recommend home health.  For detailed assessment and plan, please refer to above/below as I have made changes wherever appropriate.   Marvel Plan, MD PhD Stroke Neurology 09/05/2023 9:16 PM

## 2023-09-05 NOTE — Progress Notes (Signed)
Inpatient Rehab Admissions Coordinator:    Pt. Is progressing well and is ambulating at superversion level. I do not think that she continues to meet criteria for CIR as she is not demonstrating functional deficits to necessitate 3 hours a day of intensive rehab. CIR will sign off at this time. I notified Pt.'s daughter and she states that she would like TOC to reach out to Pt.'s husband to discuss HH vs OP.   Megan Salon, MS, CCC-SLP Rehab Admissions Coordinator  [phone] (celll) [phone] (office)

## 2023-09-05 NOTE — Progress Notes (Signed)
Patient ambulated approximately 518ft down straight portion of unit with front wheeled walker and RN as standby assist. Tolerated well.

## 2023-09-05 NOTE — Plan of Care (Signed)
Problem: Education: Goal: Knowledge of disease or condition will improve Outcome: Progressing Goal: Knowledge of secondary prevention will improve (MUST DOCUMENT ALL) Outcome: Progressing Goal: Knowledge of patient specific risk factors will improve Molly [name] N/A or DELETE if not current risk factor) Outcome: Progressing   Problem: Ischemic Stroke/TIA Tissue Perfusion: Goal: Complications of ischemic stroke/TIA will be minimized Outcome: Progressing   Problem: Coping: Goal: Will verbalize positive feelings about self Outcome: Progressing Goal: Will identify appropriate support needs Outcome: Progressing   Problem: Health Behavior/Discharge Planning: Goal: Ability to manage health-related needs will improve Outcome: Progressing Goal: Goals will be collaboratively established with patient/family Outcome: Progressing   Problem: Self-Care: Goal: Ability to participate in self-care as condition permits will improve Outcome: Progressing Goal: Verbalization of feelings and concerns over difficulty with self-care will improve Outcome: Progressing Goal: Ability to communicate needs accurately will improve Outcome: Progressing   Problem: Nutrition: Goal: Risk of aspiration will decrease Outcome: Progressing Goal: Dietary intake will improve Outcome: Progressing   Problem: Education: Goal: Knowledge of General Education information will improve Description: Including pain rating scale, medication(s)/side effects and non-pharmacologic comfort measures Outcome: Progressing   Problem: Health Behavior/Discharge Planning: Goal: Ability to manage health-related needs will improve Outcome: Progressing   Problem: Clinical Measurements: Goal: Ability to maintain clinical measurements within normal limits will improve Outcome: Progressing Goal: Will remain free from infection Outcome: Progressing Goal: Diagnostic test results will improve Outcome: Progressing Goal: Respiratory  complications will improve Outcome: Progressing Goal: Cardiovascular complication will be avoided Outcome: Progressing   Problem: Activity: Goal: Risk for activity intolerance will decrease Outcome: Progressing   Problem: Nutrition: Goal: Adequate nutrition will be maintained Outcome: Progressing   Problem: Coping: Goal: Level of anxiety will decrease Outcome: Progressing   Problem: Elimination: Goal: Will not experience complications related to bowel motility Outcome: Progressing Goal: Will not experience complications related to urinary retention Outcome: Progressing   Problem: Pain Managment: Goal: General experience of comfort will improve Outcome: Progressing   Problem: Safety: Goal: Ability to remain free from injury will improve Outcome: Progressing   Problem: Skin Integrity: Goal: Risk for impaired skin integrity will decrease Outcome: Progressing   Problem: Education: Goal: Ability to describe self-care measures that may prevent or decrease complications (Diabetes Survival Skills Education) will improve Outcome: Progressing Goal: Individualized Educational Video(s) Outcome: Progressing   Problem: Coping: Goal: Ability to adjust to condition or change in health will improve Outcome: Progressing   Problem: Fluid Volume: Goal: Ability to maintain a balanced intake and output will improve Outcome: Progressing   Problem: Health Behavior/Discharge Planning: Goal: Ability to identify and utilize available resources and services will improve Outcome: Progressing Goal: Ability to manage health-related needs will improve Outcome: Progressing   Problem: Metabolic: Goal: Ability to maintain appropriate glucose levels will improve Outcome: Progressing   Problem: Nutritional: Goal: Maintenance of adequate nutrition will improve Outcome: Progressing Goal: Progress toward achieving an optimal weight will improve Outcome: Progressing   Problem: Skin  Integrity: Goal: Risk for impaired skin integrity will decrease Outcome: Progressing   Problem: Tissue Perfusion: Goal: Adequacy of tissue perfusion will improve Outcome: Progressing   Problem: Education: Goal: Understanding of CV disease, CV risk reduction, and recovery process will improve Outcome: Progressing Goal: Individualized Educational Video(s) Outcome: Progressing   Problem: Activity: Goal: Ability to return to baseline activity level will improve Outcome: Progressing   Problem: Cardiovascular: Goal: Ability to achieve and maintain adequate cardiovascular perfusion will improve Outcome: Progressing Goal: Vascular access site(s) Level 0-1 will be maintained Outcome: Progressing  Problem: Health Behavior/Discharge Planning: Goal: Ability to safely manage health-related needs after discharge will improve Outcome: Progressing   

## 2023-09-06 DIAGNOSIS — I639 Cerebral infarction, unspecified: Secondary | ICD-10-CM | POA: Diagnosis not present

## 2023-09-06 LAB — BASIC METABOLIC PANEL
Anion gap: 10 (ref 5–15)
BUN: 15 mg/dL (ref 8–23)
CO2: 25 mmol/L (ref 22–32)
Calcium: 9.2 mg/dL (ref 8.9–10.3)
Chloride: 105 mmol/L (ref 98–111)
Creatinine, Ser: 0.71 mg/dL (ref 0.44–1.00)
GFR, Estimated: >60 mL/min (ref >60)
Glucose, Bld: 119 mg/dL — ABNORMAL HIGH (ref 70–99)
Potassium: 3.8 mmol/L (ref 3.5–5.1)
Sodium: 140 mmol/L (ref 135–145)

## 2023-09-06 LAB — GLUCOSE, CAPILLARY
Glucose-Capillary: 131 mg/dL — ABNORMAL HIGH (ref 70–99)
Glucose-Capillary: 178 mg/dL — ABNORMAL HIGH (ref 70–99)

## 2023-09-06 LAB — CBC
HCT: 39.3 % (ref 36.0–46.0)
Hemoglobin: 13.3 g/dL (ref 12.0–15.0)
MCH: 31 pg (ref 26.0–34.0)
MCHC: 33.8 g/dL (ref 30.0–36.0)
MCV: 91.6 fL (ref 80.0–100.0)
Platelets: 267 10*3/uL (ref 150–400)
RBC: 4.29 MIL/uL (ref 3.87–5.11)
RDW: 13.7 % (ref 11.5–15.5)
WBC: 9.7 10*3/uL (ref 4.0–10.5)
nRBC: 0 % (ref 0.0–0.2)

## 2023-09-06 MED ORDER — ASPIRIN 81 MG PO TBEC: 81.0000 mg | DELAYED_RELEASE_TABLET | Freq: Every day | ORAL | 12 refills | Status: AC

## 2023-09-06 MED ORDER — CLOPIDOGREL BISULFATE 75 MG PO TABS: 75.0000 mg | ORAL_TABLET | Freq: Every day | ORAL | 0 refills | Status: DC

## 2023-09-06 NOTE — Plan of Care (Signed)
Problem: Education: Goal: Knowledge of disease or condition will improve Outcome: Adequate for Discharge Goal: Knowledge of secondary prevention will improve (MUST DOCUMENT ALL) Outcome: Adequate for Discharge Goal: Knowledge of patient specific risk factors will improve Loraine Leriche N/A or DELETE if not current risk factor) Outcome: Adequate for Discharge   Problem: Ischemic Stroke/TIA Tissue Perfusion: Goal: Complications of ischemic stroke/TIA will be minimized Outcome: Adequate for Discharge   Problem: Coping: Goal: Will verbalize positive feelings about self Outcome: Adequate for Discharge Goal: Will identify appropriate support needs Outcome: Adequate for Discharge   Problem: Health Behavior/Discharge Planning: Goal: Ability to manage health-related needs will improve Outcome: Adequate for Discharge Goal: Goals will be collaboratively established with patient/family Outcome: Adequate for Discharge   Problem: Self-Care: Goal: Ability to participate in self-care as condition permits will improve Outcome: Adequate for Discharge Goal: Verbalization of feelings and concerns over difficulty with self-care will improve Outcome: Adequate for Discharge Goal: Ability to communicate needs accurately will improve Outcome: Adequate for Discharge   Problem: Nutrition: Goal: Risk of aspiration will decrease Outcome: Adequate for Discharge Goal: Dietary intake will improve Outcome: Adequate for Discharge   Problem: Education: Goal: Knowledge of General Education information will improve Description: Including pain rating scale, medication(s)/side effects and non-pharmacologic comfort measures Outcome: Adequate for Discharge   Problem: Health Behavior/Discharge Planning: Goal: Ability to manage health-related needs will improve Outcome: Adequate for Discharge   Problem: Clinical Measurements: Goal: Ability to maintain clinical measurements within normal limits will improve Outcome:  Adequate for Discharge Goal: Will remain free from infection Outcome: Adequate for Discharge Goal: Diagnostic test results will improve Outcome: Adequate for Discharge Goal: Respiratory complications will improve Outcome: Adequate for Discharge Goal: Cardiovascular complication will be avoided Outcome: Adequate for Discharge   Problem: Activity: Goal: Risk for activity intolerance will decrease Outcome: Adequate for Discharge   Problem: Nutrition: Goal: Adequate nutrition will be maintained Outcome: Adequate for Discharge   Problem: Coping: Goal: Level of anxiety will decrease Outcome: Adequate for Discharge   Problem: Elimination: Goal: Will not experience complications related to bowel motility Outcome: Adequate for Discharge Goal: Will not experience complications related to urinary retention Outcome: Adequate for Discharge   Problem: Pain Managment: Goal: General experience of comfort will improve Outcome: Adequate for Discharge   Problem: Safety: Goal: Ability to remain free from injury will improve Outcome: Adequate for Discharge   Problem: Skin Integrity: Goal: Risk for impaired skin integrity will decrease Outcome: Adequate for Discharge   Problem: Education: Goal: Ability to describe self-care measures that may prevent or decrease complications (Diabetes Survival Skills Education) will improve Outcome: Adequate for Discharge Goal: Individualized Educational Video(s) Outcome: Adequate for Discharge   Problem: Coping: Goal: Ability to adjust to condition or change in health will improve Outcome: Adequate for Discharge   Problem: Fluid Volume: Goal: Ability to maintain a balanced intake and output will improve Outcome: Adequate for Discharge   Problem: Health Behavior/Discharge Planning: Goal: Ability to identify and utilize available resources and services will improve Outcome: Adequate for Discharge Goal: Ability to manage health-related needs will  improve Outcome: Adequate for Discharge   Problem: Metabolic: Goal: Ability to maintain appropriate glucose levels will improve Outcome: Adequate for Discharge   Problem: Nutritional: Goal: Maintenance of adequate nutrition will improve Outcome: Adequate for Discharge Goal: Progress toward achieving an optimal weight will improve Outcome: Adequate for Discharge   Problem: Skin Integrity: Goal: Risk for impaired skin integrity will decrease Outcome: Adequate for Discharge   Problem: Tissue Perfusion: Goal: Adequacy  of tissue perfusion will improve Outcome: Adequate for Discharge   Problem: Education: Goal: Understanding of CV disease, CV risk reduction, and recovery process will improve Outcome: Adequate for Discharge Goal: Individualized Educational Video(s) Outcome: Adequate for Discharge   Problem: Activity: Goal: Ability to return to baseline activity level will improve Outcome: Adequate for Discharge   Problem: Cardiovascular: Goal: Ability to achieve and maintain adequate cardiovascular perfusion will improve Outcome: Adequate for Discharge Goal: Vascular access site(s) Level 0-1 will be maintained Outcome: Adequate for Discharge   Problem: Health Behavior/Discharge Planning: Goal: Ability to safely manage health-related needs after discharge will improve Outcome: Adequate for Discharge

## 2023-09-06 NOTE — Discharge Summary (Addendum)
of iterative reconstruction technique. COMPARISON:  Same day MRI. FINDINGS: Brain: Small known acute infarcts better characterized on the same day MRI. No progressive mass effect or acute hemorrhage. No hydrocephalus, extra-axial collection or mass lesion/mass effect. Vascular: No hyperdense vessel. Skull: No acute fracture. Sinuses/Orbits: No acute finding. Other: No mastoid effusions. IMPRESSION: Small known acute infarcts better characterized on the same day MRI. No progressive mass effect or acute hemorrhage. Electronically Signed   By: Feliberto Harts M.D.   On: 09/01/2023 17:26   ECHOCARDIOGRAM COMPLETE BUBBLE STUDY  Result Date: 09/01/2023     ECHOCARDIOGRAM REPORT   Patient Name:   Molly [name] Date of Exam: 09/01/2023 Medical Rec #:  [id number]   Height:       66.0 in Accession #:    [id number]  Weight:       195.5 lb Date of Birth:  May 20, 1941   BSA:          1.981 m Patient Age:    82 years    BP:           147/64 mmHg Patient Gender: F           HR:           69 bpm. Exam Location:  Inpatient Procedure: 2D Echo, Cardiac Doppler, Color Doppler and Saline Contrast Bubble            Study Indications:    Stroke I63.9  History:        Patient has no prior history of Echocardiogram examinations.                 Stroke; Risk Factors:Former Smoker, Dyslipidemia, Diabetes and                 Hypertension.  Sonographer:    Dondra Prader RVT RCS Referring Phys: [phone] Reyne Dumas [facility] IMPRESSIONS  1. Left ventricular ejection fraction, by estimation, is 60 to 65%. The left ventricle has normal function. The left ventricle has no regional wall motion abnormalities. Left ventricular diastolic parameters are indeterminate.  2. Right ventricular systolic function is normal. The right ventricular size is normal.  3. The mitral valve is degenerative. Moderate mitral valve regurgitation. No evidence of mitral stenosis. Moderate mitral annular calcification.  4. The aortic valve is normal in structure. Aortic valve regurgitation is mild to moderate. No aortic stenosis is present.  5. The inferior vena cava is normal in size with greater than 50% respiratory variability, suggesting right atrial pressure of 3 mmHg.  6. Agitated saline contrast bubble study was negative, with no evidence of any interatrial shunt. Conclusion(s)/Recommendation(s): No intracardiac source of embolism detected on this transthoracic study. Consider a transesophageal echocardiogram to exclude cardiac source of embolism if clinically indicated. FINDINGS  Left Ventricle: Left ventricular ejection fraction, by estimation, is 60 to 65%. The left ventricle has normal function. The left ventricle has no  regional wall motion abnormalities. The left ventricular internal cavity size was normal in size. There is  no left ventricular hypertrophy. Left ventricular diastolic parameters are indeterminate. Right Ventricle: The right ventricular size is normal. No increase in right ventricular wall thickness. Right ventricular systolic function is normal. Left Atrium: Left atrial size was normal in size. Right Atrium: Right atrial size was normal in size. Pericardium: There is no evidence of pericardial effusion. Mitral Valve: The mitral valve is degenerative in appearance. There is mild thickening of the mitral valve leaflet(s). Moderate mitral annular calcification. Moderate mitral valve regurgitation. No evidence  of iterative reconstruction technique. COMPARISON:  Same day MRI. FINDINGS: Brain: Small known acute infarcts better characterized on the same day MRI. No progressive mass effect or acute hemorrhage. No hydrocephalus, extra-axial collection or mass lesion/mass effect. Vascular: No hyperdense vessel. Skull: No acute fracture. Sinuses/Orbits: No acute finding. Other: No mastoid effusions. IMPRESSION: Small known acute infarcts better characterized on the same day MRI. No progressive mass effect or acute hemorrhage. Electronically Signed   By: Feliberto Harts M.D.   On: 09/01/2023 17:26   ECHOCARDIOGRAM COMPLETE BUBBLE STUDY  Result Date: 09/01/2023     ECHOCARDIOGRAM REPORT   Patient Name:   Molly [name] Date of Exam: 09/01/2023 Medical Rec #:  [id number]   Height:       66.0 in Accession #:    [id number]  Weight:       195.5 lb Date of Birth:  May 20, 1941   BSA:          1.981 m Patient Age:    82 years    BP:           147/64 mmHg Patient Gender: F           HR:           69 bpm. Exam Location:  Inpatient Procedure: 2D Echo, Cardiac Doppler, Color Doppler and Saline Contrast Bubble            Study Indications:    Stroke I63.9  History:        Patient has no prior history of Echocardiogram examinations.                 Stroke; Risk Factors:Former Smoker, Dyslipidemia, Diabetes and                 Hypertension.  Sonographer:    Dondra Prader RVT RCS Referring Phys: [phone] Reyne Dumas [facility] IMPRESSIONS  1. Left ventricular ejection fraction, by estimation, is 60 to 65%. The left ventricle has normal function. The left ventricle has no regional wall motion abnormalities. Left ventricular diastolic parameters are indeterminate.  2. Right ventricular systolic function is normal. The right ventricular size is normal.  3. The mitral valve is degenerative. Moderate mitral valve regurgitation. No evidence of mitral stenosis. Moderate mitral annular calcification.  4. The aortic valve is normal in structure. Aortic valve regurgitation is mild to moderate. No aortic stenosis is present.  5. The inferior vena cava is normal in size with greater than 50% respiratory variability, suggesting right atrial pressure of 3 mmHg.  6. Agitated saline contrast bubble study was negative, with no evidence of any interatrial shunt. Conclusion(s)/Recommendation(s): No intracardiac source of embolism detected on this transthoracic study. Consider a transesophageal echocardiogram to exclude cardiac source of embolism if clinically indicated. FINDINGS  Left Ventricle: Left ventricular ejection fraction, by estimation, is 60 to 65%. The left ventricle has normal function. The left ventricle has no  regional wall motion abnormalities. The left ventricular internal cavity size was normal in size. There is  no left ventricular hypertrophy. Left ventricular diastolic parameters are indeterminate. Right Ventricle: The right ventricular size is normal. No increase in right ventricular wall thickness. Right ventricular systolic function is normal. Left Atrium: Left atrial size was normal in size. Right Atrium: Right atrial size was normal in size. Pericardium: There is no evidence of pericardial effusion. Mitral Valve: The mitral valve is degenerative in appearance. There is mild thickening of the mitral valve leaflet(s). Moderate mitral annular calcification. Moderate mitral valve regurgitation. No evidence  of iterative reconstruction technique. COMPARISON:  Same day MRI. FINDINGS: Brain: Small known acute infarcts better characterized on the same day MRI. No progressive mass effect or acute hemorrhage. No hydrocephalus, extra-axial collection or mass lesion/mass effect. Vascular: No hyperdense vessel. Skull: No acute fracture. Sinuses/Orbits: No acute finding. Other: No mastoid effusions. IMPRESSION: Small known acute infarcts better characterized on the same day MRI. No progressive mass effect or acute hemorrhage. Electronically Signed   By: Feliberto Harts M.D.   On: 09/01/2023 17:26   ECHOCARDIOGRAM COMPLETE BUBBLE STUDY  Result Date: 09/01/2023     ECHOCARDIOGRAM REPORT   Patient Name:   Molly [name] Date of Exam: 09/01/2023 Medical Rec #:  [id number]   Height:       66.0 in Accession #:    [id number]  Weight:       195.5 lb Date of Birth:  May 20, 1941   BSA:          1.981 m Patient Age:    82 years    BP:           147/64 mmHg Patient Gender: F           HR:           69 bpm. Exam Location:  Inpatient Procedure: 2D Echo, Cardiac Doppler, Color Doppler and Saline Contrast Bubble            Study Indications:    Stroke I63.9  History:        Patient has no prior history of Echocardiogram examinations.                 Stroke; Risk Factors:Former Smoker, Dyslipidemia, Diabetes and                 Hypertension.  Sonographer:    Dondra Prader RVT RCS Referring Phys: [phone] Reyne Dumas [facility] IMPRESSIONS  1. Left ventricular ejection fraction, by estimation, is 60 to 65%. The left ventricle has normal function. The left ventricle has no regional wall motion abnormalities. Left ventricular diastolic parameters are indeterminate.  2. Right ventricular systolic function is normal. The right ventricular size is normal.  3. The mitral valve is degenerative. Moderate mitral valve regurgitation. No evidence of mitral stenosis. Moderate mitral annular calcification.  4. The aortic valve is normal in structure. Aortic valve regurgitation is mild to moderate. No aortic stenosis is present.  5. The inferior vena cava is normal in size with greater than 50% respiratory variability, suggesting right atrial pressure of 3 mmHg.  6. Agitated saline contrast bubble study was negative, with no evidence of any interatrial shunt. Conclusion(s)/Recommendation(s): No intracardiac source of embolism detected on this transthoracic study. Consider a transesophageal echocardiogram to exclude cardiac source of embolism if clinically indicated. FINDINGS  Left Ventricle: Left ventricular ejection fraction, by estimation, is 60 to 65%. The left ventricle has normal function. The left ventricle has no  regional wall motion abnormalities. The left ventricular internal cavity size was normal in size. There is  no left ventricular hypertrophy. Left ventricular diastolic parameters are indeterminate. Right Ventricle: The right ventricular size is normal. No increase in right ventricular wall thickness. Right ventricular systolic function is normal. Left Atrium: Left atrial size was normal in size. Right Atrium: Right atrial size was normal in size. Pericardium: There is no evidence of pericardial effusion. Mitral Valve: The mitral valve is degenerative in appearance. There is mild thickening of the mitral valve leaflet(s). Moderate mitral annular calcification. Moderate mitral valve regurgitation. No evidence  Stroke Discharge Summary  Patient ID: Molly [name]   MRN: [id number]      DOB: Aug 25, 1941  Date of Admission: 08/31/2023 Date of Discharge: 09/06/2023  Attending Physician:  Stroke, Md, MD, Stroke MD Consultant(s):    None  Patient's PCP:  Marguarite Arbour, MD    DISCHARGE DIAGNOSIS:   Primary discharge diagnosis:  Acute Ischemic Infarct: Patchy left MCA territory and bilateral ACA scattered infarcts left MCA occlusion s/p TNK and IR with TICI 2B, etiology: Large vessel occlusion versus cardioembolic source   Secondary diagnosis: HTN HLD DM Leukocytosis, resolved   LABORATORY STUDIES CBC    Component Value Date/Time   WBC 9.7 09/06/2023 0548   RBC 4.29 09/06/2023 0548   HGB 13.3 09/06/2023 0548   HCT 39.3 09/06/2023 0548   PLT 267 09/06/2023 0548   MCV 91.6 09/06/2023 0548   MCH 31.0 09/06/2023 0548   MCHC 33.8 09/06/2023 0548   RDW 13.7 09/06/2023 0548   LYMPHSABS 2.3 08/31/2023 0930   MONOABS 0.6 08/31/2023 0930   EOSABS 0.2 08/31/2023 0930   BASOSABS 0.0 08/31/2023 0930   CMP    Component Value Date/Time   NA 140 09/06/2023 0548   NA 143 02/10/2023 1503   K 3.8 09/06/2023 0548   CL 105 09/06/2023 0548   CO2 25 09/06/2023 0548   GLUCOSE 119 (H) 09/06/2023 0548   BUN 15 09/06/2023 0548   BUN 20 02/10/2023 1503   CREATININE 0.71 09/06/2023 0548   CALCIUM 9.2 09/06/2023 0548   PROT 5.5 (L) 09/01/2023 0656   PROT 7.2 02/10/2023 1503   ALBUMIN 3.1 (L) 09/01/2023 0656   ALBUMIN 4.9 (H) 02/10/2023 1503   AST 23 09/01/2023 0656   ALT 23 09/01/2023 0656   ALKPHOS 25 (L) 09/01/2023 0656   BILITOT 0.6 09/01/2023 0656   BILITOT 0.5 02/10/2023 1503   GFRNONAA >60 09/06/2023 0548   GFRAA >60 08/11/2019 0326   COAGS Lab Results  Component Value Date   INR 1.0 08/31/2023   INR 1.13 03/19/2018   Lipid Panel    Component Value Date/Time   CHOL 133 09/01/2023 0656   TRIG 89 09/01/2023 0656   HDL 52 09/01/2023 0656   CHOLHDL 2.6 09/01/2023 0656    VLDL 18 09/01/2023 0656   LDLCALC 63 09/01/2023 0656   HgbA1C  Lab Results  Component Value Date   HGBA1C 6.7 (H) 08/31/2023   Urinalysis    Component Value Date/Time   COLORURINE STRAW (A) 01/25/2021 0030   APPEARANCEUR CLEAR (A) 01/25/2021 0030   LABSPEC 1.013 01/25/2021 0030   PHURINE 5.0 01/25/2021 0030   GLUCOSEU 150 (A) 01/25/2021 0030   HGBUR NEGATIVE 01/25/2021 0030   BILIRUBINUR NEGATIVE 01/25/2021 0030   KETONESUR 5 (A) 01/25/2021 0030   PROTEINUR NEGATIVE 01/25/2021 0030   NITRITE NEGATIVE 01/25/2021 0030   LEUKOCYTESUR SMALL (A) 01/25/2021 0030   Urine Drug Screen No results found for: "LABOPIA", "COCAINSCRNUR", "LABBENZ", "AMPHETMU", "THCU", "LABBARB"  Alcohol Level    Component Value Date/Time   ETH <10 08/31/2023 0930     SIGNIFICANT DIAGNOSTIC STUDIES CT HEAD WO CONTRAST ( )  Result Date: 09/01/2023 CLINICAL DATA:  Stroke, follow up EXAM: CT HEAD WITHOUT CONTRAST TECHNIQUE: Contiguous axial images were obtained from the base of the skull through the vertex without intravenous contrast. RADIATION DOSE REDUCTION: This exam was performed according to the departmental dose-optimization program which includes automated exposure control, adjustment of the mA and/or kV according to patient size and/or use  of iterative reconstruction technique. COMPARISON:  Same day MRI. FINDINGS: Brain: Small known acute infarcts better characterized on the same day MRI. No progressive mass effect or acute hemorrhage. No hydrocephalus, extra-axial collection or mass lesion/mass effect. Vascular: No hyperdense vessel. Skull: No acute fracture. Sinuses/Orbits: No acute finding. Other: No mastoid effusions. IMPRESSION: Small known acute infarcts better characterized on the same day MRI. No progressive mass effect or acute hemorrhage. Electronically Signed   By: Feliberto Harts M.D.   On: 09/01/2023 17:26   ECHOCARDIOGRAM COMPLETE BUBBLE STUDY  Result Date: 09/01/2023     ECHOCARDIOGRAM REPORT   Patient Name:   Molly [name] Date of Exam: 09/01/2023 Medical Rec #:  [id number]   Height:       66.0 in Accession #:    [id number]  Weight:       195.5 lb Date of Birth:  May 20, 1941   BSA:          1.981 m Patient Age:    82 years    BP:           147/64 mmHg Patient Gender: F           HR:           69 bpm. Exam Location:  Inpatient Procedure: 2D Echo, Cardiac Doppler, Color Doppler and Saline Contrast Bubble            Study Indications:    Stroke I63.9  History:        Patient has no prior history of Echocardiogram examinations.                 Stroke; Risk Factors:Former Smoker, Dyslipidemia, Diabetes and                 Hypertension.  Sonographer:    Dondra Prader RVT RCS Referring Phys: [phone] Reyne Dumas [facility] IMPRESSIONS  1. Left ventricular ejection fraction, by estimation, is 60 to 65%. The left ventricle has normal function. The left ventricle has no regional wall motion abnormalities. Left ventricular diastolic parameters are indeterminate.  2. Right ventricular systolic function is normal. The right ventricular size is normal.  3. The mitral valve is degenerative. Moderate mitral valve regurgitation. No evidence of mitral stenosis. Moderate mitral annular calcification.  4. The aortic valve is normal in structure. Aortic valve regurgitation is mild to moderate. No aortic stenosis is present.  5. The inferior vena cava is normal in size with greater than 50% respiratory variability, suggesting right atrial pressure of 3 mmHg.  6. Agitated saline contrast bubble study was negative, with no evidence of any interatrial shunt. Conclusion(s)/Recommendation(s): No intracardiac source of embolism detected on this transthoracic study. Consider a transesophageal echocardiogram to exclude cardiac source of embolism if clinically indicated. FINDINGS  Left Ventricle: Left ventricular ejection fraction, by estimation, is 60 to 65%. The left ventricle has normal function. The left ventricle has no  regional wall motion abnormalities. The left ventricular internal cavity size was normal in size. There is  no left ventricular hypertrophy. Left ventricular diastolic parameters are indeterminate. Right Ventricle: The right ventricular size is normal. No increase in right ventricular wall thickness. Right ventricular systolic function is normal. Left Atrium: Left atrial size was normal in size. Right Atrium: Right atrial size was normal in size. Pericardium: There is no evidence of pericardial effusion. Mitral Valve: The mitral valve is degenerative in appearance. There is mild thickening of the mitral valve leaflet(s). Moderate mitral annular calcification. Moderate mitral valve regurgitation. No evidence  Stroke Discharge Summary  Patient ID: Molly [name]   MRN: [id number]      DOB: Aug 25, 1941  Date of Admission: 08/31/2023 Date of Discharge: 09/06/2023  Attending Physician:  Stroke, Md, MD, Stroke MD Consultant(s):    None  Patient's PCP:  Marguarite Arbour, MD    DISCHARGE DIAGNOSIS:   Primary discharge diagnosis:  Acute Ischemic Infarct: Patchy left MCA territory and bilateral ACA scattered infarcts left MCA occlusion s/p TNK and IR with TICI 2B, etiology: Large vessel occlusion versus cardioembolic source   Secondary diagnosis: HTN HLD DM Leukocytosis, resolved   LABORATORY STUDIES CBC    Component Value Date/Time   WBC 9.7 09/06/2023 0548   RBC 4.29 09/06/2023 0548   HGB 13.3 09/06/2023 0548   HCT 39.3 09/06/2023 0548   PLT 267 09/06/2023 0548   MCV 91.6 09/06/2023 0548   MCH 31.0 09/06/2023 0548   MCHC 33.8 09/06/2023 0548   RDW 13.7 09/06/2023 0548   LYMPHSABS 2.3 08/31/2023 0930   MONOABS 0.6 08/31/2023 0930   EOSABS 0.2 08/31/2023 0930   BASOSABS 0.0 08/31/2023 0930   CMP    Component Value Date/Time   NA 140 09/06/2023 0548   NA 143 02/10/2023 1503   K 3.8 09/06/2023 0548   CL 105 09/06/2023 0548   CO2 25 09/06/2023 0548   GLUCOSE 119 (H) 09/06/2023 0548   BUN 15 09/06/2023 0548   BUN 20 02/10/2023 1503   CREATININE 0.71 09/06/2023 0548   CALCIUM 9.2 09/06/2023 0548   PROT 5.5 (L) 09/01/2023 0656   PROT 7.2 02/10/2023 1503   ALBUMIN 3.1 (L) 09/01/2023 0656   ALBUMIN 4.9 (H) 02/10/2023 1503   AST 23 09/01/2023 0656   ALT 23 09/01/2023 0656   ALKPHOS 25 (L) 09/01/2023 0656   BILITOT 0.6 09/01/2023 0656   BILITOT 0.5 02/10/2023 1503   GFRNONAA >60 09/06/2023 0548   GFRAA >60 08/11/2019 0326   COAGS Lab Results  Component Value Date   INR 1.0 08/31/2023   INR 1.13 03/19/2018   Lipid Panel    Component Value Date/Time   CHOL 133 09/01/2023 0656   TRIG 89 09/01/2023 0656   HDL 52 09/01/2023 0656   CHOLHDL 2.6 09/01/2023 0656    VLDL 18 09/01/2023 0656   LDLCALC 63 09/01/2023 0656   HgbA1C  Lab Results  Component Value Date   HGBA1C 6.7 (H) 08/31/2023   Urinalysis    Component Value Date/Time   COLORURINE STRAW (A) 01/25/2021 0030   APPEARANCEUR CLEAR (A) 01/25/2021 0030   LABSPEC 1.013 01/25/2021 0030   PHURINE 5.0 01/25/2021 0030   GLUCOSEU 150 (A) 01/25/2021 0030   HGBUR NEGATIVE 01/25/2021 0030   BILIRUBINUR NEGATIVE 01/25/2021 0030   KETONESUR 5 (A) 01/25/2021 0030   PROTEINUR NEGATIVE 01/25/2021 0030   NITRITE NEGATIVE 01/25/2021 0030   LEUKOCYTESUR SMALL (A) 01/25/2021 0030   Urine Drug Screen No results found for: "LABOPIA", "COCAINSCRNUR", "LABBENZ", "AMPHETMU", "THCU", "LABBARB"  Alcohol Level    Component Value Date/Time   ETH <10 08/31/2023 0930     SIGNIFICANT DIAGNOSTIC STUDIES CT HEAD WO CONTRAST ( )  Result Date: 09/01/2023 CLINICAL DATA:  Stroke, follow up EXAM: CT HEAD WITHOUT CONTRAST TECHNIQUE: Contiguous axial images were obtained from the base of the skull through the vertex without intravenous contrast. RADIATION DOSE REDUCTION: This exam was performed according to the departmental dose-optimization program which includes automated exposure control, adjustment of the mA and/or kV according to patient size and/or use  Stroke Discharge Summary  Patient ID: Molly [name]   MRN: [id number]      DOB: Aug 25, 1941  Date of Admission: 08/31/2023 Date of Discharge: 09/06/2023  Attending Physician:  Stroke, Md, MD, Stroke MD Consultant(s):    None  Patient's PCP:  Marguarite Arbour, MD    DISCHARGE DIAGNOSIS:   Primary discharge diagnosis:  Acute Ischemic Infarct: Patchy left MCA territory and bilateral ACA scattered infarcts left MCA occlusion s/p TNK and IR with TICI 2B, etiology: Large vessel occlusion versus cardioembolic source   Secondary diagnosis: HTN HLD DM Leukocytosis, resolved   LABORATORY STUDIES CBC    Component Value Date/Time   WBC 9.7 09/06/2023 0548   RBC 4.29 09/06/2023 0548   HGB 13.3 09/06/2023 0548   HCT 39.3 09/06/2023 0548   PLT 267 09/06/2023 0548   MCV 91.6 09/06/2023 0548   MCH 31.0 09/06/2023 0548   MCHC 33.8 09/06/2023 0548   RDW 13.7 09/06/2023 0548   LYMPHSABS 2.3 08/31/2023 0930   MONOABS 0.6 08/31/2023 0930   EOSABS 0.2 08/31/2023 0930   BASOSABS 0.0 08/31/2023 0930   CMP    Component Value Date/Time   NA 140 09/06/2023 0548   NA 143 02/10/2023 1503   K 3.8 09/06/2023 0548   CL 105 09/06/2023 0548   CO2 25 09/06/2023 0548   GLUCOSE 119 (H) 09/06/2023 0548   BUN 15 09/06/2023 0548   BUN 20 02/10/2023 1503   CREATININE 0.71 09/06/2023 0548   CALCIUM 9.2 09/06/2023 0548   PROT 5.5 (L) 09/01/2023 0656   PROT 7.2 02/10/2023 1503   ALBUMIN 3.1 (L) 09/01/2023 0656   ALBUMIN 4.9 (H) 02/10/2023 1503   AST 23 09/01/2023 0656   ALT 23 09/01/2023 0656   ALKPHOS 25 (L) 09/01/2023 0656   BILITOT 0.6 09/01/2023 0656   BILITOT 0.5 02/10/2023 1503   GFRNONAA >60 09/06/2023 0548   GFRAA >60 08/11/2019 0326   COAGS Lab Results  Component Value Date   INR 1.0 08/31/2023   INR 1.13 03/19/2018   Lipid Panel    Component Value Date/Time   CHOL 133 09/01/2023 0656   TRIG 89 09/01/2023 0656   HDL 52 09/01/2023 0656   CHOLHDL 2.6 09/01/2023 0656    VLDL 18 09/01/2023 0656   LDLCALC 63 09/01/2023 0656   HgbA1C  Lab Results  Component Value Date   HGBA1C 6.7 (H) 08/31/2023   Urinalysis    Component Value Date/Time   COLORURINE STRAW (A) 01/25/2021 0030   APPEARANCEUR CLEAR (A) 01/25/2021 0030   LABSPEC 1.013 01/25/2021 0030   PHURINE 5.0 01/25/2021 0030   GLUCOSEU 150 (A) 01/25/2021 0030   HGBUR NEGATIVE 01/25/2021 0030   BILIRUBINUR NEGATIVE 01/25/2021 0030   KETONESUR 5 (A) 01/25/2021 0030   PROTEINUR NEGATIVE 01/25/2021 0030   NITRITE NEGATIVE 01/25/2021 0030   LEUKOCYTESUR SMALL (A) 01/25/2021 0030   Urine Drug Screen No results found for: "LABOPIA", "COCAINSCRNUR", "LABBENZ", "AMPHETMU", "THCU", "LABBARB"  Alcohol Level    Component Value Date/Time   ETH <10 08/31/2023 0930     SIGNIFICANT DIAGNOSTIC STUDIES CT HEAD WO CONTRAST ( )  Result Date: 09/01/2023 CLINICAL DATA:  Stroke, follow up EXAM: CT HEAD WITHOUT CONTRAST TECHNIQUE: Contiguous axial images were obtained from the base of the skull through the vertex without intravenous contrast. RADIATION DOSE REDUCTION: This exam was performed according to the departmental dose-optimization program which includes automated exposure control, adjustment of the mA and/or kV according to patient size and/or use  Stroke Discharge Summary  Patient ID: Molly [name]   MRN: [id number]      DOB: Aug 25, 1941  Date of Admission: 08/31/2023 Date of Discharge: 09/06/2023  Attending Physician:  Stroke, Md, MD, Stroke MD Consultant(s):    None  Patient's PCP:  Marguarite Arbour, MD    DISCHARGE DIAGNOSIS:   Primary discharge diagnosis:  Acute Ischemic Infarct: Patchy left MCA territory and bilateral ACA scattered infarcts left MCA occlusion s/p TNK and IR with TICI 2B, etiology: Large vessel occlusion versus cardioembolic source   Secondary diagnosis: HTN HLD DM Leukocytosis, resolved   LABORATORY STUDIES CBC    Component Value Date/Time   WBC 9.7 09/06/2023 0548   RBC 4.29 09/06/2023 0548   HGB 13.3 09/06/2023 0548   HCT 39.3 09/06/2023 0548   PLT 267 09/06/2023 0548   MCV 91.6 09/06/2023 0548   MCH 31.0 09/06/2023 0548   MCHC 33.8 09/06/2023 0548   RDW 13.7 09/06/2023 0548   LYMPHSABS 2.3 08/31/2023 0930   MONOABS 0.6 08/31/2023 0930   EOSABS 0.2 08/31/2023 0930   BASOSABS 0.0 08/31/2023 0930   CMP    Component Value Date/Time   NA 140 09/06/2023 0548   NA 143 02/10/2023 1503   K 3.8 09/06/2023 0548   CL 105 09/06/2023 0548   CO2 25 09/06/2023 0548   GLUCOSE 119 (H) 09/06/2023 0548   BUN 15 09/06/2023 0548   BUN 20 02/10/2023 1503   CREATININE 0.71 09/06/2023 0548   CALCIUM 9.2 09/06/2023 0548   PROT 5.5 (L) 09/01/2023 0656   PROT 7.2 02/10/2023 1503   ALBUMIN 3.1 (L) 09/01/2023 0656   ALBUMIN 4.9 (H) 02/10/2023 1503   AST 23 09/01/2023 0656   ALT 23 09/01/2023 0656   ALKPHOS 25 (L) 09/01/2023 0656   BILITOT 0.6 09/01/2023 0656   BILITOT 0.5 02/10/2023 1503   GFRNONAA >60 09/06/2023 0548   GFRAA >60 08/11/2019 0326   COAGS Lab Results  Component Value Date   INR 1.0 08/31/2023   INR 1.13 03/19/2018   Lipid Panel    Component Value Date/Time   CHOL 133 09/01/2023 0656   TRIG 89 09/01/2023 0656   HDL 52 09/01/2023 0656   CHOLHDL 2.6 09/01/2023 0656    VLDL 18 09/01/2023 0656   LDLCALC 63 09/01/2023 0656   HgbA1C  Lab Results  Component Value Date   HGBA1C 6.7 (H) 08/31/2023   Urinalysis    Component Value Date/Time   COLORURINE STRAW (A) 01/25/2021 0030   APPEARANCEUR CLEAR (A) 01/25/2021 0030   LABSPEC 1.013 01/25/2021 0030   PHURINE 5.0 01/25/2021 0030   GLUCOSEU 150 (A) 01/25/2021 0030   HGBUR NEGATIVE 01/25/2021 0030   BILIRUBINUR NEGATIVE 01/25/2021 0030   KETONESUR 5 (A) 01/25/2021 0030   PROTEINUR NEGATIVE 01/25/2021 0030   NITRITE NEGATIVE 01/25/2021 0030   LEUKOCYTESUR SMALL (A) 01/25/2021 0030   Urine Drug Screen No results found for: "LABOPIA", "COCAINSCRNUR", "LABBENZ", "AMPHETMU", "THCU", "LABBARB"  Alcohol Level    Component Value Date/Time   ETH <10 08/31/2023 0930     SIGNIFICANT DIAGNOSTIC STUDIES CT HEAD WO CONTRAST ( )  Result Date: 09/01/2023 CLINICAL DATA:  Stroke, follow up EXAM: CT HEAD WITHOUT CONTRAST TECHNIQUE: Contiguous axial images were obtained from the base of the skull through the vertex without intravenous contrast. RADIATION DOSE REDUCTION: This exam was performed according to the departmental dose-optimization program which includes automated exposure control, adjustment of the mA and/or kV according to patient size and/or use  of iterative reconstruction technique. COMPARISON:  Same day MRI. FINDINGS: Brain: Small known acute infarcts better characterized on the same day MRI. No progressive mass effect or acute hemorrhage. No hydrocephalus, extra-axial collection or mass lesion/mass effect. Vascular: No hyperdense vessel. Skull: No acute fracture. Sinuses/Orbits: No acute finding. Other: No mastoid effusions. IMPRESSION: Small known acute infarcts better characterized on the same day MRI. No progressive mass effect or acute hemorrhage. Electronically Signed   By: Feliberto Harts M.D.   On: 09/01/2023 17:26   ECHOCARDIOGRAM COMPLETE BUBBLE STUDY  Result Date: 09/01/2023     ECHOCARDIOGRAM REPORT   Patient Name:   Molly [name] Date of Exam: 09/01/2023 Medical Rec #:  [id number]   Height:       66.0 in Accession #:    [id number]  Weight:       195.5 lb Date of Birth:  May 20, 1941   BSA:          1.981 m Patient Age:    82 years    BP:           147/64 mmHg Patient Gender: F           HR:           69 bpm. Exam Location:  Inpatient Procedure: 2D Echo, Cardiac Doppler, Color Doppler and Saline Contrast Bubble            Study Indications:    Stroke I63.9  History:        Patient has no prior history of Echocardiogram examinations.                 Stroke; Risk Factors:Former Smoker, Dyslipidemia, Diabetes and                 Hypertension.  Sonographer:    Dondra Prader RVT RCS Referring Phys: [phone] Reyne Dumas [facility] IMPRESSIONS  1. Left ventricular ejection fraction, by estimation, is 60 to 65%. The left ventricle has normal function. The left ventricle has no regional wall motion abnormalities. Left ventricular diastolic parameters are indeterminate.  2. Right ventricular systolic function is normal. The right ventricular size is normal.  3. The mitral valve is degenerative. Moderate mitral valve regurgitation. No evidence of mitral stenosis. Moderate mitral annular calcification.  4. The aortic valve is normal in structure. Aortic valve regurgitation is mild to moderate. No aortic stenosis is present.  5. The inferior vena cava is normal in size with greater than 50% respiratory variability, suggesting right atrial pressure of 3 mmHg.  6. Agitated saline contrast bubble study was negative, with no evidence of any interatrial shunt. Conclusion(s)/Recommendation(s): No intracardiac source of embolism detected on this transthoracic study. Consider a transesophageal echocardiogram to exclude cardiac source of embolism if clinically indicated. FINDINGS  Left Ventricle: Left ventricular ejection fraction, by estimation, is 60 to 65%. The left ventricle has normal function. The left ventricle has no  regional wall motion abnormalities. The left ventricular internal cavity size was normal in size. There is  no left ventricular hypertrophy. Left ventricular diastolic parameters are indeterminate. Right Ventricle: The right ventricular size is normal. No increase in right ventricular wall thickness. Right ventricular systolic function is normal. Left Atrium: Left atrial size was normal in size. Right Atrium: Right atrial size was normal in size. Pericardium: There is no evidence of pericardial effusion. Mitral Valve: The mitral valve is degenerative in appearance. There is mild thickening of the mitral valve leaflet(s). Moderate mitral annular calcification. Moderate mitral valve regurgitation. No evidence  of iterative reconstruction technique. COMPARISON:  Same day MRI. FINDINGS: Brain: Small known acute infarcts better characterized on the same day MRI. No progressive mass effect or acute hemorrhage. No hydrocephalus, extra-axial collection or mass lesion/mass effect. Vascular: No hyperdense vessel. Skull: No acute fracture. Sinuses/Orbits: No acute finding. Other: No mastoid effusions. IMPRESSION: Small known acute infarcts better characterized on the same day MRI. No progressive mass effect or acute hemorrhage. Electronically Signed   By: Feliberto Harts M.D.   On: 09/01/2023 17:26   ECHOCARDIOGRAM COMPLETE BUBBLE STUDY  Result Date: 09/01/2023     ECHOCARDIOGRAM REPORT   Patient Name:   Molly [name] Date of Exam: 09/01/2023 Medical Rec #:  [id number]   Height:       66.0 in Accession #:    [id number]  Weight:       195.5 lb Date of Birth:  May 20, 1941   BSA:          1.981 m Patient Age:    82 years    BP:           147/64 mmHg Patient Gender: F           HR:           69 bpm. Exam Location:  Inpatient Procedure: 2D Echo, Cardiac Doppler, Color Doppler and Saline Contrast Bubble            Study Indications:    Stroke I63.9  History:        Patient has no prior history of Echocardiogram examinations.                 Stroke; Risk Factors:Former Smoker, Dyslipidemia, Diabetes and                 Hypertension.  Sonographer:    Dondra Prader RVT RCS Referring Phys: [phone] Reyne Dumas [facility] IMPRESSIONS  1. Left ventricular ejection fraction, by estimation, is 60 to 65%. The left ventricle has normal function. The left ventricle has no regional wall motion abnormalities. Left ventricular diastolic parameters are indeterminate.  2. Right ventricular systolic function is normal. The right ventricular size is normal.  3. The mitral valve is degenerative. Moderate mitral valve regurgitation. No evidence of mitral stenosis. Moderate mitral annular calcification.  4. The aortic valve is normal in structure. Aortic valve regurgitation is mild to moderate. No aortic stenosis is present.  5. The inferior vena cava is normal in size with greater than 50% respiratory variability, suggesting right atrial pressure of 3 mmHg.  6. Agitated saline contrast bubble study was negative, with no evidence of any interatrial shunt. Conclusion(s)/Recommendation(s): No intracardiac source of embolism detected on this transthoracic study. Consider a transesophageal echocardiogram to exclude cardiac source of embolism if clinically indicated. FINDINGS  Left Ventricle: Left ventricular ejection fraction, by estimation, is 60 to 65%. The left ventricle has normal function. The left ventricle has no  regional wall motion abnormalities. The left ventricular internal cavity size was normal in size. There is  no left ventricular hypertrophy. Left ventricular diastolic parameters are indeterminate. Right Ventricle: The right ventricular size is normal. No increase in right ventricular wall thickness. Right ventricular systolic function is normal. Left Atrium: Left atrial size was normal in size. Right Atrium: Right atrial size was normal in size. Pericardium: There is no evidence of pericardial effusion. Mitral Valve: The mitral valve is degenerative in appearance. There is mild thickening of the mitral valve leaflet(s). Moderate mitral annular calcification. Moderate mitral valve regurgitation. No evidence  of iterative reconstruction technique. COMPARISON:  Same day MRI. FINDINGS: Brain: Small known acute infarcts better characterized on the same day MRI. No progressive mass effect or acute hemorrhage. No hydrocephalus, extra-axial collection or mass lesion/mass effect. Vascular: No hyperdense vessel. Skull: No acute fracture. Sinuses/Orbits: No acute finding. Other: No mastoid effusions. IMPRESSION: Small known acute infarcts better characterized on the same day MRI. No progressive mass effect or acute hemorrhage. Electronically Signed   By: Feliberto Harts M.D.   On: 09/01/2023 17:26   ECHOCARDIOGRAM COMPLETE BUBBLE STUDY  Result Date: 09/01/2023     ECHOCARDIOGRAM REPORT   Patient Name:   Molly [name] Date of Exam: 09/01/2023 Medical Rec #:  [id number]   Height:       66.0 in Accession #:    [id number]  Weight:       195.5 lb Date of Birth:  May 20, 1941   BSA:          1.981 m Patient Age:    82 years    BP:           147/64 mmHg Patient Gender: F           HR:           69 bpm. Exam Location:  Inpatient Procedure: 2D Echo, Cardiac Doppler, Color Doppler and Saline Contrast Bubble            Study Indications:    Stroke I63.9  History:        Patient has no prior history of Echocardiogram examinations.                 Stroke; Risk Factors:Former Smoker, Dyslipidemia, Diabetes and                 Hypertension.  Sonographer:    Dondra Prader RVT RCS Referring Phys: [phone] Reyne Dumas [facility] IMPRESSIONS  1. Left ventricular ejection fraction, by estimation, is 60 to 65%. The left ventricle has normal function. The left ventricle has no regional wall motion abnormalities. Left ventricular diastolic parameters are indeterminate.  2. Right ventricular systolic function is normal. The right ventricular size is normal.  3. The mitral valve is degenerative. Moderate mitral valve regurgitation. No evidence of mitral stenosis. Moderate mitral annular calcification.  4. The aortic valve is normal in structure. Aortic valve regurgitation is mild to moderate. No aortic stenosis is present.  5. The inferior vena cava is normal in size with greater than 50% respiratory variability, suggesting right atrial pressure of 3 mmHg.  6. Agitated saline contrast bubble study was negative, with no evidence of any interatrial shunt. Conclusion(s)/Recommendation(s): No intracardiac source of embolism detected on this transthoracic study. Consider a transesophageal echocardiogram to exclude cardiac source of embolism if clinically indicated. FINDINGS  Left Ventricle: Left ventricular ejection fraction, by estimation, is 60 to 65%. The left ventricle has normal function. The left ventricle has no  regional wall motion abnormalities. The left ventricular internal cavity size was normal in size. There is  no left ventricular hypertrophy. Left ventricular diastolic parameters are indeterminate. Right Ventricle: The right ventricular size is normal. No increase in right ventricular wall thickness. Right ventricular systolic function is normal. Left Atrium: Left atrial size was normal in size. Right Atrium: Right atrial size was normal in size. Pericardium: There is no evidence of pericardial effusion. Mitral Valve: The mitral valve is degenerative in appearance. There is mild thickening of the mitral valve leaflet(s). Moderate mitral annular calcification. Moderate mitral valve regurgitation. No evidence  of iterative reconstruction technique. COMPARISON:  Same day MRI. FINDINGS: Brain: Small known acute infarcts better characterized on the same day MRI. No progressive mass effect or acute hemorrhage. No hydrocephalus, extra-axial collection or mass lesion/mass effect. Vascular: No hyperdense vessel. Skull: No acute fracture. Sinuses/Orbits: No acute finding. Other: No mastoid effusions. IMPRESSION: Small known acute infarcts better characterized on the same day MRI. No progressive mass effect or acute hemorrhage. Electronically Signed   By: Feliberto Harts M.D.   On: 09/01/2023 17:26   ECHOCARDIOGRAM COMPLETE BUBBLE STUDY  Result Date: 09/01/2023     ECHOCARDIOGRAM REPORT   Patient Name:   Molly [name] Date of Exam: 09/01/2023 Medical Rec #:  [id number]   Height:       66.0 in Accession #:    [id number]  Weight:       195.5 lb Date of Birth:  May 20, 1941   BSA:          1.981 m Patient Age:    82 years    BP:           147/64 mmHg Patient Gender: F           HR:           69 bpm. Exam Location:  Inpatient Procedure: 2D Echo, Cardiac Doppler, Color Doppler and Saline Contrast Bubble            Study Indications:    Stroke I63.9  History:        Patient has no prior history of Echocardiogram examinations.                 Stroke; Risk Factors:Former Smoker, Dyslipidemia, Diabetes and                 Hypertension.  Sonographer:    Dondra Prader RVT RCS Referring Phys: [phone] Reyne Dumas [facility] IMPRESSIONS  1. Left ventricular ejection fraction, by estimation, is 60 to 65%. The left ventricle has normal function. The left ventricle has no regional wall motion abnormalities. Left ventricular diastolic parameters are indeterminate.  2. Right ventricular systolic function is normal. The right ventricular size is normal.  3. The mitral valve is degenerative. Moderate mitral valve regurgitation. No evidence of mitral stenosis. Moderate mitral annular calcification.  4. The aortic valve is normal in structure. Aortic valve regurgitation is mild to moderate. No aortic stenosis is present.  5. The inferior vena cava is normal in size with greater than 50% respiratory variability, suggesting right atrial pressure of 3 mmHg.  6. Agitated saline contrast bubble study was negative, with no evidence of any interatrial shunt. Conclusion(s)/Recommendation(s): No intracardiac source of embolism detected on this transthoracic study. Consider a transesophageal echocardiogram to exclude cardiac source of embolism if clinically indicated. FINDINGS  Left Ventricle: Left ventricular ejection fraction, by estimation, is 60 to 65%. The left ventricle has normal function. The left ventricle has no  regional wall motion abnormalities. The left ventricular internal cavity size was normal in size. There is  no left ventricular hypertrophy. Left ventricular diastolic parameters are indeterminate. Right Ventricle: The right ventricular size is normal. No increase in right ventricular wall thickness. Right ventricular systolic function is normal. Left Atrium: Left atrial size was normal in size. Right Atrium: Right atrial size was normal in size. Pericardium: There is no evidence of pericardial effusion. Mitral Valve: The mitral valve is degenerative in appearance. There is mild thickening of the mitral valve leaflet(s). Moderate mitral annular calcification. Moderate mitral valve regurgitation. No evidence  of iterative reconstruction technique. COMPARISON:  Same day MRI. FINDINGS: Brain: Small known acute infarcts better characterized on the same day MRI. No progressive mass effect or acute hemorrhage. No hydrocephalus, extra-axial collection or mass lesion/mass effect. Vascular: No hyperdense vessel. Skull: No acute fracture. Sinuses/Orbits: No acute finding. Other: No mastoid effusions. IMPRESSION: Small known acute infarcts better characterized on the same day MRI. No progressive mass effect or acute hemorrhage. Electronically Signed   By: Feliberto Harts M.D.   On: 09/01/2023 17:26   ECHOCARDIOGRAM COMPLETE BUBBLE STUDY  Result Date: 09/01/2023     ECHOCARDIOGRAM REPORT   Patient Name:   Molly [name] Date of Exam: 09/01/2023 Medical Rec #:  [id number]   Height:       66.0 in Accession #:    [id number]  Weight:       195.5 lb Date of Birth:  May 20, 1941   BSA:          1.981 m Patient Age:    82 years    BP:           147/64 mmHg Patient Gender: F           HR:           69 bpm. Exam Location:  Inpatient Procedure: 2D Echo, Cardiac Doppler, Color Doppler and Saline Contrast Bubble            Study Indications:    Stroke I63.9  History:        Patient has no prior history of Echocardiogram examinations.                 Stroke; Risk Factors:Former Smoker, Dyslipidemia, Diabetes and                 Hypertension.  Sonographer:    Dondra Prader RVT RCS Referring Phys: [phone] Reyne Dumas [facility] IMPRESSIONS  1. Left ventricular ejection fraction, by estimation, is 60 to 65%. The left ventricle has normal function. The left ventricle has no regional wall motion abnormalities. Left ventricular diastolic parameters are indeterminate.  2. Right ventricular systolic function is normal. The right ventricular size is normal.  3. The mitral valve is degenerative. Moderate mitral valve regurgitation. No evidence of mitral stenosis. Moderate mitral annular calcification.  4. The aortic valve is normal in structure. Aortic valve regurgitation is mild to moderate. No aortic stenosis is present.  5. The inferior vena cava is normal in size with greater than 50% respiratory variability, suggesting right atrial pressure of 3 mmHg.  6. Agitated saline contrast bubble study was negative, with no evidence of any interatrial shunt. Conclusion(s)/Recommendation(s): No intracardiac source of embolism detected on this transthoracic study. Consider a transesophageal echocardiogram to exclude cardiac source of embolism if clinically indicated. FINDINGS  Left Ventricle: Left ventricular ejection fraction, by estimation, is 60 to 65%. The left ventricle has normal function. The left ventricle has no  regional wall motion abnormalities. The left ventricular internal cavity size was normal in size. There is  no left ventricular hypertrophy. Left ventricular diastolic parameters are indeterminate. Right Ventricle: The right ventricular size is normal. No increase in right ventricular wall thickness. Right ventricular systolic function is normal. Left Atrium: Left atrial size was normal in size. Right Atrium: Right atrial size was normal in size. Pericardium: There is no evidence of pericardial effusion. Mitral Valve: The mitral valve is degenerative in appearance. There is mild thickening of the mitral valve leaflet(s). Moderate mitral annular calcification. Moderate mitral valve regurgitation. No evidence  of iterative reconstruction technique. COMPARISON:  Same day MRI. FINDINGS: Brain: Small known acute infarcts better characterized on the same day MRI. No progressive mass effect or acute hemorrhage. No hydrocephalus, extra-axial collection or mass lesion/mass effect. Vascular: No hyperdense vessel. Skull: No acute fracture. Sinuses/Orbits: No acute finding. Other: No mastoid effusions. IMPRESSION: Small known acute infarcts better characterized on the same day MRI. No progressive mass effect or acute hemorrhage. Electronically Signed   By: Feliberto Harts M.D.   On: 09/01/2023 17:26   ECHOCARDIOGRAM COMPLETE BUBBLE STUDY  Result Date: 09/01/2023     ECHOCARDIOGRAM REPORT   Patient Name:   Molly [name] Date of Exam: 09/01/2023 Medical Rec #:  [id number]   Height:       66.0 in Accession #:    [id number]  Weight:       195.5 lb Date of Birth:  May 20, 1941   BSA:          1.981 m Patient Age:    82 years    BP:           147/64 mmHg Patient Gender: F           HR:           69 bpm. Exam Location:  Inpatient Procedure: 2D Echo, Cardiac Doppler, Color Doppler and Saline Contrast Bubble            Study Indications:    Stroke I63.9  History:        Patient has no prior history of Echocardiogram examinations.                 Stroke; Risk Factors:Former Smoker, Dyslipidemia, Diabetes and                 Hypertension.  Sonographer:    Dondra Prader RVT RCS Referring Phys: [phone] Reyne Dumas [facility] IMPRESSIONS  1. Left ventricular ejection fraction, by estimation, is 60 to 65%. The left ventricle has normal function. The left ventricle has no regional wall motion abnormalities. Left ventricular diastolic parameters are indeterminate.  2. Right ventricular systolic function is normal. The right ventricular size is normal.  3. The mitral valve is degenerative. Moderate mitral valve regurgitation. No evidence of mitral stenosis. Moderate mitral annular calcification.  4. The aortic valve is normal in structure. Aortic valve regurgitation is mild to moderate. No aortic stenosis is present.  5. The inferior vena cava is normal in size with greater than 50% respiratory variability, suggesting right atrial pressure of 3 mmHg.  6. Agitated saline contrast bubble study was negative, with no evidence of any interatrial shunt. Conclusion(s)/Recommendation(s): No intracardiac source of embolism detected on this transthoracic study. Consider a transesophageal echocardiogram to exclude cardiac source of embolism if clinically indicated. FINDINGS  Left Ventricle: Left ventricular ejection fraction, by estimation, is 60 to 65%. The left ventricle has normal function. The left ventricle has no  regional wall motion abnormalities. The left ventricular internal cavity size was normal in size. There is  no left ventricular hypertrophy. Left ventricular diastolic parameters are indeterminate. Right Ventricle: The right ventricular size is normal. No increase in right ventricular wall thickness. Right ventricular systolic function is normal. Left Atrium: Left atrial size was normal in size. Right Atrium: Right atrial size was normal in size. Pericardium: There is no evidence of pericardial effusion. Mitral Valve: The mitral valve is degenerative in appearance. There is mild thickening of the mitral valve leaflet(s). Moderate mitral annular calcification. Moderate mitral valve regurgitation. No evidence  of iterative reconstruction technique. COMPARISON:  Same day MRI. FINDINGS: Brain: Small known acute infarcts better characterized on the same day MRI. No progressive mass effect or acute hemorrhage. No hydrocephalus, extra-axial collection or mass lesion/mass effect. Vascular: No hyperdense vessel. Skull: No acute fracture. Sinuses/Orbits: No acute finding. Other: No mastoid effusions. IMPRESSION: Small known acute infarcts better characterized on the same day MRI. No progressive mass effect or acute hemorrhage. Electronically Signed   By: Feliberto Harts M.D.   On: 09/01/2023 17:26   ECHOCARDIOGRAM COMPLETE BUBBLE STUDY  Result Date: 09/01/2023     ECHOCARDIOGRAM REPORT   Patient Name:   Molly [name] Date of Exam: 09/01/2023 Medical Rec #:  [id number]   Height:       66.0 in Accession #:    [id number]  Weight:       195.5 lb Date of Birth:  May 20, 1941   BSA:          1.981 m Patient Age:    82 years    BP:           147/64 mmHg Patient Gender: F           HR:           69 bpm. Exam Location:  Inpatient Procedure: 2D Echo, Cardiac Doppler, Color Doppler and Saline Contrast Bubble            Study Indications:    Stroke I63.9  History:        Patient has no prior history of Echocardiogram examinations.                 Stroke; Risk Factors:Former Smoker, Dyslipidemia, Diabetes and                 Hypertension.  Sonographer:    Dondra Prader RVT RCS Referring Phys: [phone] Reyne Dumas [facility] IMPRESSIONS  1. Left ventricular ejection fraction, by estimation, is 60 to 65%. The left ventricle has normal function. The left ventricle has no regional wall motion abnormalities. Left ventricular diastolic parameters are indeterminate.  2. Right ventricular systolic function is normal. The right ventricular size is normal.  3. The mitral valve is degenerative. Moderate mitral valve regurgitation. No evidence of mitral stenosis. Moderate mitral annular calcification.  4. The aortic valve is normal in structure. Aortic valve regurgitation is mild to moderate. No aortic stenosis is present.  5. The inferior vena cava is normal in size with greater than 50% respiratory variability, suggesting right atrial pressure of 3 mmHg.  6. Agitated saline contrast bubble study was negative, with no evidence of any interatrial shunt. Conclusion(s)/Recommendation(s): No intracardiac source of embolism detected on this transthoracic study. Consider a transesophageal echocardiogram to exclude cardiac source of embolism if clinically indicated. FINDINGS  Left Ventricle: Left ventricular ejection fraction, by estimation, is 60 to 65%. The left ventricle has normal function. The left ventricle has no  regional wall motion abnormalities. The left ventricular internal cavity size was normal in size. There is  no left ventricular hypertrophy. Left ventricular diastolic parameters are indeterminate. Right Ventricle: The right ventricular size is normal. No increase in right ventricular wall thickness. Right ventricular systolic function is normal. Left Atrium: Left atrial size was normal in size. Right Atrium: Right atrial size was normal in size. Pericardium: There is no evidence of pericardial effusion. Mitral Valve: The mitral valve is degenerative in appearance. There is mild thickening of the mitral valve leaflet(s). Moderate mitral annular calcification. Moderate mitral valve regurgitation. No evidence  of iterative reconstruction technique. COMPARISON:  Same day MRI. FINDINGS: Brain: Small known acute infarcts better characterized on the same day MRI. No progressive mass effect or acute hemorrhage. No hydrocephalus, extra-axial collection or mass lesion/mass effect. Vascular: No hyperdense vessel. Skull: No acute fracture. Sinuses/Orbits: No acute finding. Other: No mastoid effusions. IMPRESSION: Small known acute infarcts better characterized on the same day MRI. No progressive mass effect or acute hemorrhage. Electronically Signed   By: Feliberto Harts M.D.   On: 09/01/2023 17:26   ECHOCARDIOGRAM COMPLETE BUBBLE STUDY  Result Date: 09/01/2023     ECHOCARDIOGRAM REPORT   Patient Name:   Molly [name] Date of Exam: 09/01/2023 Medical Rec #:  [id number]   Height:       66.0 in Accession #:    [id number]  Weight:       195.5 lb Date of Birth:  May 20, 1941   BSA:          1.981 m Patient Age:    82 years    BP:           147/64 mmHg Patient Gender: F           HR:           69 bpm. Exam Location:  Inpatient Procedure: 2D Echo, Cardiac Doppler, Color Doppler and Saline Contrast Bubble            Study Indications:    Stroke I63.9  History:        Patient has no prior history of Echocardiogram examinations.                 Stroke; Risk Factors:Former Smoker, Dyslipidemia, Diabetes and                 Hypertension.  Sonographer:    Dondra Prader RVT RCS Referring Phys: [phone] Reyne Dumas [facility] IMPRESSIONS  1. Left ventricular ejection fraction, by estimation, is 60 to 65%. The left ventricle has normal function. The left ventricle has no regional wall motion abnormalities. Left ventricular diastolic parameters are indeterminate.  2. Right ventricular systolic function is normal. The right ventricular size is normal.  3. The mitral valve is degenerative. Moderate mitral valve regurgitation. No evidence of mitral stenosis. Moderate mitral annular calcification.  4. The aortic valve is normal in structure. Aortic valve regurgitation is mild to moderate. No aortic stenosis is present.  5. The inferior vena cava is normal in size with greater than 50% respiratory variability, suggesting right atrial pressure of 3 mmHg.  6. Agitated saline contrast bubble study was negative, with no evidence of any interatrial shunt. Conclusion(s)/Recommendation(s): No intracardiac source of embolism detected on this transthoracic study. Consider a transesophageal echocardiogram to exclude cardiac source of embolism if clinically indicated. FINDINGS  Left Ventricle: Left ventricular ejection fraction, by estimation, is 60 to 65%. The left ventricle has normal function. The left ventricle has no  regional wall motion abnormalities. The left ventricular internal cavity size was normal in size. There is  no left ventricular hypertrophy. Left ventricular diastolic parameters are indeterminate. Right Ventricle: The right ventricular size is normal. No increase in right ventricular wall thickness. Right ventricular systolic function is normal. Left Atrium: Left atrial size was normal in size. Right Atrium: Right atrial size was normal in size. Pericardium: There is no evidence of pericardial effusion. Mitral Valve: The mitral valve is degenerative in appearance. There is mild thickening of the mitral valve leaflet(s). Moderate mitral annular calcification. Moderate mitral valve regurgitation. No evidence

## 2023-09-07 ENCOUNTER — Emergency Department: Payer: PPO

## 2023-09-07 ENCOUNTER — Other Ambulatory Visit: Payer: Self-pay

## 2023-09-07 ENCOUNTER — Observation Stay
Admission: EM | Admit: 2023-09-07 | Discharge: 2023-09-09 | Disposition: A | Discharge status: Home / Self Care | Payer: PPO | Attending: Internal Medicine | Admitting: Internal Medicine

## 2023-09-07 ENCOUNTER — Encounter: Payer: Self-pay | Admitting: Radiology

## 2023-09-07 DIAGNOSIS — R200 Anesthesia of skin: Secondary | ICD-10-CM | POA: Diagnosis not present

## 2023-09-07 DIAGNOSIS — Z7984 Long term (current) use of oral hypoglycemic drugs: Secondary | ICD-10-CM | POA: Insufficient documentation

## 2023-09-07 DIAGNOSIS — Z794 Long term (current) use of insulin: Secondary | ICD-10-CM | POA: Insufficient documentation

## 2023-09-07 DIAGNOSIS — E119 Type 2 diabetes mellitus without complications: Secondary | ICD-10-CM | POA: Diagnosis not present

## 2023-09-07 DIAGNOSIS — M6281 Muscle weakness (generalized): Secondary | ICD-10-CM | POA: Insufficient documentation

## 2023-09-07 DIAGNOSIS — Z85828 Personal history of other malignant neoplasm of skin: Secondary | ICD-10-CM | POA: Insufficient documentation

## 2023-09-07 DIAGNOSIS — I700 Atherosclerosis of aorta: Secondary | ICD-10-CM | POA: Diagnosis not present

## 2023-09-07 DIAGNOSIS — Z7902 Long term (current) use of antithrombotics/antiplatelets: Secondary | ICD-10-CM | POA: Diagnosis not present

## 2023-09-07 DIAGNOSIS — Z87891 Personal history of nicotine dependence: Secondary | ICD-10-CM | POA: Diagnosis not present

## 2023-09-07 DIAGNOSIS — R4701 Aphasia: Principal | ICD-10-CM | POA: Insufficient documentation

## 2023-09-07 DIAGNOSIS — I6523 Occlusion and stenosis of bilateral carotid arteries: Secondary | ICD-10-CM | POA: Diagnosis not present

## 2023-09-07 DIAGNOSIS — Z8673 Personal history of transient ischemic attack (TIA), and cerebral infarction without residual deficits: Secondary | ICD-10-CM | POA: Insufficient documentation

## 2023-09-07 DIAGNOSIS — R2681 Unsteadiness on feet: Secondary | ICD-10-CM | POA: Diagnosis not present

## 2023-09-07 DIAGNOSIS — R7989 Other specified abnormal findings of blood chemistry: Secondary | ICD-10-CM | POA: Diagnosis present

## 2023-09-07 DIAGNOSIS — G9389 Other specified disorders of brain: Secondary | ICD-10-CM | POA: Diagnosis not present

## 2023-09-07 DIAGNOSIS — Z79899 Other long term (current) drug therapy: Secondary | ICD-10-CM | POA: Diagnosis not present

## 2023-09-07 DIAGNOSIS — G459 Transient cerebral ischemic attack, unspecified: Secondary | ICD-10-CM | POA: Diagnosis not present

## 2023-09-07 DIAGNOSIS — R2689 Other abnormalities of gait and mobility: Secondary | ICD-10-CM | POA: Diagnosis not present

## 2023-09-07 DIAGNOSIS — R059 Cough, unspecified: Secondary | ICD-10-CM | POA: Diagnosis not present

## 2023-09-07 DIAGNOSIS — I10 Essential (primary) hypertension: Secondary | ICD-10-CM | POA: Diagnosis not present

## 2023-09-07 DIAGNOSIS — I080 Rheumatic disorders of both mitral and aortic valves: Secondary | ICD-10-CM | POA: Diagnosis not present

## 2023-09-07 DIAGNOSIS — R29818 Other symptoms and signs involving the nervous system: Secondary | ICD-10-CM | POA: Diagnosis not present

## 2023-09-07 DIAGNOSIS — I3481 Nonrheumatic mitral (valve) annulus calcification: Secondary | ICD-10-CM | POA: Diagnosis not present

## 2023-09-07 DIAGNOSIS — Z7982 Long term (current) use of aspirin: Secondary | ICD-10-CM | POA: Insufficient documentation

## 2023-09-07 DIAGNOSIS — E785 Hyperlipidemia, unspecified: Secondary | ICD-10-CM | POA: Diagnosis present

## 2023-09-07 DIAGNOSIS — I639 Cerebral infarction, unspecified: Principal | ICD-10-CM | POA: Diagnosis present

## 2023-09-07 LAB — DIFFERENTIAL
Abs Immature Granulocytes: 0.03 10*3/uL (ref 0.00–0.07)
Basophils Absolute: 0 10*3/uL (ref 0.0–0.1)
Basophils Relative: 0 %
Eosinophils Absolute: 0.2 10*3/uL (ref 0.0–0.5)
Eosinophils Relative: 3 %
Immature Granulocytes: 0 %
Lymphocytes Relative: 25 %
Lymphs Abs: 2.3 10*3/uL (ref 0.7–4.0)
Monocytes Absolute: 0.9 10*3/uL (ref 0.1–1.0)
Monocytes Relative: 10 %
Neutro Abs: 5.8 10*3/uL (ref 1.7–7.7)
Neutrophils Relative %: 62 %

## 2023-09-07 LAB — COMPREHENSIVE METABOLIC PANEL
ALT: 35 U/L (ref 0–44)
AST: 37 U/L (ref 15–41)
Albumin: 4.1 g/dL (ref 3.5–5.0)
Alkaline Phosphatase: 43 U/L (ref 38–126)
Anion gap: 11 (ref 5–15)
BUN: 17 mg/dL (ref 8–23)
CO2: 23 mmol/L (ref 22–32)
Calcium: 9.1 mg/dL (ref 8.9–10.3)
Chloride: 105 mmol/L (ref 98–111)
Creatinine, Ser: 0.69 mg/dL (ref 0.44–1.00)
GFR, Estimated: >60 mL/min (ref >60)
Glucose, Bld: 162 mg/dL — ABNORMAL HIGH (ref 70–99)
Potassium: 3.7 mmol/L (ref 3.5–5.1)
Sodium: 139 mmol/L (ref 135–145)
Total Bilirubin: 0.9 mg/dL (ref 0.3–1.2)
Total Protein: 7.3 g/dL (ref 6.5–8.1)

## 2023-09-07 LAB — URINALYSIS, COMPLETE (UACMP) WITH MICROSCOPIC
Bacteria, UA: NONE SEEN
Bilirubin Urine: NEGATIVE
Glucose, UA: NEGATIVE mg/dL
Hgb urine dipstick: NEGATIVE
Ketones, ur: 5 mg/dL — AB
Nitrite: NEGATIVE
Protein, ur: NEGATIVE mg/dL
Specific Gravity, Urine: >1.046 — ABNORMAL HIGH (ref 1.005–1.030)
pH: 5 (ref 5.0–8.0)

## 2023-09-07 LAB — CBG MONITORING, ED: Glucose-Capillary: 141 mg/dL — ABNORMAL HIGH (ref 70–99)

## 2023-09-07 LAB — URINE DRUG SCREEN, QUALITATIVE (ARMC ONLY)
Amphetamines, Ur Screen: NOT DETECTED
Barbiturates, Ur Screen: NOT DETECTED
Benzodiazepine, Ur Scrn: NOT DETECTED
Cannabinoid 50 Ng, Ur ~~LOC~~: NOT DETECTED
Cocaine Metabolite,Ur ~~LOC~~: NOT DETECTED
MDMA (Ecstasy)Ur Screen: NOT DETECTED
Methadone Scn, Ur: NOT DETECTED
Opiate, Ur Screen: NOT DETECTED
Phencyclidine (PCP) Ur S: NOT DETECTED
Tricyclic, Ur Screen: NOT DETECTED

## 2023-09-07 LAB — LIPID PANEL
Cholesterol: 146 mg/dL (ref 0–200)
HDL: 53 mg/dL (ref >40)
LDL Cholesterol: 58 mg/dL (ref 0–99)
Total CHOL/HDL Ratio: 2.8 {ratio}
Triglycerides: 175 mg/dL — ABNORMAL HIGH (ref <150)
VLDL: 35 mg/dL (ref 0–40)

## 2023-09-07 LAB — CBC
HCT: 44.1 % (ref 36.0–46.0)
Hemoglobin: 14.6 g/dL (ref 12.0–15.0)
MCH: 31.3 pg (ref 26.0–34.0)
MCHC: 33.1 g/dL (ref 30.0–36.0)
MCV: 94.4 fL (ref 80.0–100.0)
Platelets: 288 10*3/uL (ref 150–400)
RBC: 4.67 MIL/uL (ref 3.87–5.11)
RDW: 13.8 % (ref 11.5–15.5)
WBC: 9.3 10*3/uL (ref 4.0–10.5)
nRBC: 0 % (ref 0.0–0.2)

## 2023-09-07 LAB — TROPONIN I (HIGH SENSITIVITY): Troponin I (High Sensitivity): 8 ng/L (ref <18)

## 2023-09-07 LAB — AMMONIA: Ammonia: <10 umol/L (ref 9–35)

## 2023-09-07 LAB — D-DIMER, QUANTITATIVE: D-Dimer, Quant: 0.51 ug{FEU}/mL — ABNORMAL HIGH (ref 0.00–0.50)

## 2023-09-07 LAB — PROTIME-INR
INR: 1 (ref 0.8–1.2)
Prothrombin Time: 13.5 s (ref 11.4–15.2)

## 2023-09-07 LAB — ETHANOL: Alcohol, Ethyl (B): <10 mg/dL (ref <10)

## 2023-09-07 LAB — APTT: aPTT: 30 s (ref 24–36)

## 2023-09-07 MED ORDER — ACETAMINOPHEN 325 MG PO TABS
650.0000 mg | ORAL_TABLET | Freq: Four times a day (QID) | ORAL | Status: DC | PRN
  Administered 2023-09-07 – 2023-09-09 (×4): 650 mg via ORAL
  Filled 2023-09-07 (×4): qty 2

## 2023-09-07 MED ORDER — ACETAMINOPHEN 160 MG/5ML PO SOLN: 650.0000 mg | ORAL | Status: DC | PRN

## 2023-09-07 MED ORDER — SODIUM CHLORIDE 0.9% FLUSH
3.0000 mL | Freq: Once | INTRAVENOUS | Status: AC
  Administered 2023-09-07: 3 mL via INTRAVENOUS

## 2023-09-07 MED ORDER — ACETAMINOPHEN 325 MG PO TABS: 650.0000 mg | ORAL_TABLET | ORAL | Status: DC | PRN

## 2023-09-07 MED ORDER — ACETAMINOPHEN 650 MG RE SUPP
650.0000 mg | RECTAL | Status: DC | PRN
Start: 2023-09-07 — End: 2023-09-09

## 2023-09-07 MED ORDER — SODIUM CHLORIDE 0.9% FLUSH
10.0000 mL | Freq: Two times a day (BID) | INTRAVENOUS | Status: DC
  Administered 2023-09-08 – 2023-09-09 (×3): 10 mL via INTRAVENOUS

## 2023-09-07 MED ORDER — ACETAMINOPHEN 325 MG RE SUPP: 650.0000 mg | RECTAL | Status: DC | PRN

## 2023-09-07 MED ORDER — IOHEXOL 350 MG/ML SOLN
75.0000 mL | Freq: Once | INTRAVENOUS | Status: AC | PRN
  Administered 2023-09-07: 75 mL via INTRAVENOUS

## 2023-09-07 MED ORDER — STROKE: EARLY STAGES OF RECOVERY BOOK: Freq: Once | Status: AC

## 2023-09-07 NOTE — ED Provider Notes (Signed)
[facility] Provider Note    Event Date/Time   First MD Initiated Contact with Patient 09/07/23 1624     (approximate)   History   Code Stroke   HPI  Molly [name] is a 82 year old female with history of T2DM, HTN presenting to the emergency department for evaluation of aphasia as a code stroke.  Around 10 AM, patient noticed some tingling in her right hand that resolved.  About 20 minutes prior to presentation she had onset of difficulty getting her words out that lasted for about 15 minutes.  This was noted by EMS, but improving at the time my initial evaluation on presentation to the ER.  Patient was just discharged yesterday after presenting with right-sided weakness and aphasia on 10/21.  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     Physical Exam   Triage Vital Signs: ED Triage Vitals [09/07/23 1648]  Encounter Vitals Group     BP      Systolic BP Percentile      Diastolic BP Percentile      Pulse Rate 86     Resp 14     Temp 98.5 F (36.9 C)     Temp Source Oral     SpO2 98 %     Weight      Height      Head Circumference      Peak Flow      Pain Score      Pain Loc      Pain Education      Exclude from Growth Chart     Most recent vital signs: Vitals:   09/07/23 1620 09/07/23 1648  Pulse:  86  Resp:  14  Temp:  98.5 F (36.9 C)  SpO2: 95% 98%     General: Awake, interactive  CV:  Regular rate, good peripheral perfusion.  Resp:  Unlabored respirations.  Abd:  Soft, nontender, nondistended Neuro:  Keenly aware, correctly answers month and age, able to blink eyes and squeeze hands, normal horizontal extraocular movements, no visual field loss, possible mild right facial droop, no arm or leg motor drift, no limb ataxia, normal sensation, perhaps minimal word finding difficulties, but intact naming, fluid speech without dysarthria, no inattention at time of my initial  evaluation  ED Results / Procedures / Treatments   Labs (all labs ordered are listed, but only abnormal results are displayed) Labs Reviewed  COMPREHENSIVE METABOLIC PANEL - Abnormal; Notable for the following components:      Result Value   Glucose, Bld 162 (*)    All other components within normal limits  CBG MONITORING, ED - Abnormal; Notable for the following components:   Glucose-Capillary 141 (*)    All other components within normal limits  PROTIME-INR  APTT  CBC  DIFFERENTIAL  ETHANOL  AMMONIA  URINALYSIS, COMPLETE (UACMP) WITH MICROSCOPIC  VITAMIN B12  D-DIMER, QUANTITATIVE  I-STAT CREATININE, ED  TROPONIN I (HIGH SENSITIVITY)     EKG EKG independently reviewed interpreted by myself (ER attending) demonstrates:  EKG demonstrates sinus rhythm at a rate of 84, PR 167, QRS 88, QTc 489, no acute ST changes  RADIOLOGY Imaging independently reviewed and interpreted by myself demonstrates:  CT head without acute bleed CTA without evidence of LVO  PROCEDURES:  Critical Care performed: No  Procedures   MEDICATIONS ORDERED IN ED: Medications  sodium chloride flush (NS) 0.9 % injection 3  mL (3 mLs Intravenous Given 09/07/23 1710)  iohexol (OMNIPAQUE) 350 MG/ML injection 75 mL (75 mLs Intravenous Contrast Given 09/07/23 1638)     IMPRESSION / MDM / ASSESSMENT AND PLAN / ED COURSE  I reviewed the triage vital signs and the nursing notes.  Differential diagnosis includes, but is not limited to, acute CVA, recrudescence of prior stroke, acute intracranial bleed, other acute intracranial process  Patient's presentation is most consistent with acute presentation with potential threat to life or bodily function.  82 year old female presenting with word finding difficulty and aphasia in the setting of recent large vessel occlusion for which she received TNK and thrombectomy.  Code stroke was activated.  On my initial evaluation, patient's speech is much improved.   Reviewed with Dr. Roda Shutters with teleneurology team. He felt her imaging was unchanged, but did recommend admission for at least 1 night for repeat MRI and PT/OT evaluation.  If patient able to pass bedside swallow screening, did feel that she was okay for a diet.  I discussed this with the patient who is comfortable with this plan.  Will reach out to hospitalist team to discuss admission.  Reviewed with Dr. Allena Katz.  Requested that troponin and D-dimer be added on which I have ordered.  She will evaluate patient for anticipated admission.    FINAL CLINICAL IMPRESSION(S) / ED DIAGNOSES   Final diagnoses:  Aphasia     Rx / DC Orders   ED Discharge Orders     None        Note:  This document was prepared using Dragon voice recognition software and may include unintentional dictation errors.   Trinna Post, MD 09/07/23 [phone]

## 2023-09-07 NOTE — ED Notes (Signed)
CODE  STROKE  CALLED  TO  CARELINK  AT  4:16PM

## 2023-09-07 NOTE — ED Notes (Signed)
Informed RN Heather via chat/ Pt has bed assigned

## 2023-09-07 NOTE — H&P (Signed)
History and Physical    Patient: Molly [name]:[id number] DOB: Jul 02, 1941 DOA: 09/07/2023 DOS: the patient was seen and examined on 09/08/2023 PCP: Marguarite Arbour, MD  Patient coming from: Home  Chief Complaint:  Chief Complaint  Patient presents with   Code Stroke    HPI: Molly [name] is a 82 y.o. female with medical history significant for Diabetes mellitus type 2, hypertension, hyperlipidemia, GERD, recent acute ischemic stroke status post TNK and thrombectomy for left MCA occlusion with TICI2b reperfusion.  Repeat MRI showed patchy left MCA and bilateral ACA scattered infarcts.  Repeat CT head and neck showed left MCA patent.  EF 60 to 65%.  LDL 63 and A1c 6.7.  Her symptoms resolved and neurologic intact on discharge yesterday on 10/27.  Upon discharge patient was to have cardiac monitoring for identifying underlying atrial fibrillation.  Son on the phone states that they were not happy that mom did not have the cardiac monitor on discharge were not aware that they were going to be mailed the monitor.  Son states that mom was on the recliner at home resting she got up and went outside in the yard walked to the neighbors house and walked back and after returning a few minutes later she became altered as in was awake alert oriented but she was making incomprehensible sounds and not words but gibberish.  And he called EMS because he was worried about her having another stroke.  Patient states that yesterday night she had also tingling in her left arm along with chest pain and headache which has since resolved.  In ed pt is nonfocal and has returned to baseline and has ambulated. D/W son that we will also monitor her heart and consider PE eval.   In the emergency room code stroke called as patient is high risk and status post thrombectomy.  Patient was seen by neurology and MRI / EEG was recommended.  In emergency room vitals trend shows: Vitals:   09/07/23 2130 09/07/23 2200 09/07/23  2230 09/07/23 2327  BP: (!) 171/60 (!) 163/55 (!) 171/61 (!) 171/60  Pulse: 79 75 70 69  Temp:    98 F (36.7 C)  Resp: (!) 21 (!) 21 19   Height:      Weight:      SpO2: 97% 94% 96% 97%  TempSrc:    Oral  BMI (Calculated):      Labs are notable for glucose 162 normal kidney function normal LFTs, normal CBC with differential, D-dimer of 0.51.  Initial EKG shows sinus rhythm at 84 with QTc of 489 no acute ST-T wave changes PR interval of 167 upright axis.  CT today is no acute abnormality no ICH or subarachnoid hemorrhage and CT angio head and neck negative for any large vessel occlusion and unchanged from last CT angio which showed the left MCA was patent, patient is not a candidate for TNKase due to the recent stroke mild symptoms and recent thrombectomy.  In the ED pt received: Medications  sodium chloride flush (NS) 0.9 % injection 10 mL (10 mLs Intravenous Not Given 09/07/23 2113)  acetaminophen (TYLENOL) tablet 650 mg (650 mg Oral Given 09/07/23 2222)    Or  acetaminophen (TYLENOL) 160 MG/5ML solution 650 mg ( Per Tube See Alternative 09/07/23 2222)    Or  acetaminophen (TYLENOL) suppository 650 mg ( Rectal See Alternative 09/07/23 2222)  clopidogrel (PLAVIX) tablet 75 mg (has no administration in time range)  aspirin EC tablet 81 mg (has  no administration in time range)  metoprolol tartrate (LOPRESSOR) tablet 25 mg (has no administration in time range)  lisinopril (ZESTRIL) tablet 10 mg (has no administration in time range)  pantoprazole (PROTONIX) EC tablet 40 mg (has no administration in time range)  rosuvastatin (CRESTOR) tablet 40 mg (has no administration in time range)  insulin aspart (novoLOG) injection 0-15 Units (has no administration in time range)  hydrALAZINE (APRESOLINE) injection 5 mg (has no administration in time range)  sodium chloride flush (NS) 0.9 % injection 3 mL (3 mLs Intravenous Given 09/07/23 1710)  iohexol (OMNIPAQUE) 350 MG/ML injection 75 mL (75 mLs  Intravenous Contrast Given 09/07/23 1638)   stroke: early stages of recovery book ( Does not apply Given 09/08/23 0012)   Review of Systems  Neurological:  Positive for tingling, speech change and headaches.  All other systems reviewed and are negative.  Past Medical History:  Diagnosis Date   Arrhythmia    Arthritis    Basal cell carcinoma    right neck, right forehead - treated in the past   Cancer (HCC)    skin   CMV (cytomegalovirus infection) (HCC)    Colon polyps    Diabetes mellitus, type 2 (HCC)    Duodenitis    Dysrhythmia    Fatty liver disease, nonalcoholic    FH: colonic polyps    hx of-adenomatous   GERD (gastroesophageal reflux disease)    Heart murmur    dx'd in 1960s. followed by PCP   Hemorrhoid .   Hyperlipidemia    Hypertension    IBS (irritable bowel syndrome)    Osteoporosis    Pancreatitis    drug induced   Past Surgical History:  Procedure Laterality Date   ABDOMINAL HYSTERECTOMY     Carotids  3/08   mild only   CATARACT EXTRACTION W/PHACO Right 06/29/2017   Procedure: CATARACT EXTRACTION PHACO AND INTRAOCULAR LENS PLACEMENT (IOC)  Toric Diabetic Right;  Surgeon: Nevada Crane, MD;  Location: [facility];  Service: Ophthalmology;  Laterality: Right;  diabetic - oral meds   CATARACT EXTRACTION W/PHACO Right 07/27/2017   Procedure: CATARACT EXTRACTION PHACO AND INTRAOCULAR LENS PLACEMENT (IOC) LEFT DIABETIC TORIC;  Surgeon: Nevada Crane, MD;  Location: [facility];  Service: Ophthalmology;  Laterality: Right;  Diabetic - oral meds   COLONOSCOPY     COLONOSCOPY WITH PROPOFOL N/A 07/20/2019   Procedure: COLONOSCOPY WITH PROPOFOL;  Surgeon: Toledo, Boykin Nearing, MD;  Location: [facility] ENDOSCOPY;  Service: Gastroenterology;  Laterality: N/A;   cystocele, cystourethropexy  12/87   dexa     increase T - 3.1 spine, normal femur 1/04   ESOPHAGOGASTRODUODENOSCOPY     ESOPHAGOGASTRODUODENOSCOPY (EGD) WITH PROPOFOL N/A 04/16/2018    Procedure: ESOPHAGOGASTRODUODENOSCOPY (EGD) WITH PROPOFOL;  Surgeon: Scot Jun, MD;  Location: [facility] END[facility]COPY;  Service: Endoscopy;  Laterality: N/A;   ESOPHAGOGASTRODUODENOSCOPY (EGD) WITH PROPOFOL N/A 07/20/2019   Procedure: ESOPHAGOGASTRODUODENOSCOPY (EGD) WITH PROPOFOL;  Surgeon: Toledo, Boykin Nearing, MD;  Location: [facility] ENDOSCOPY;  Service: Gastroenterology;  Laterality: N/A;   EYE SURGERY     hysterectomy (other)  1980   IR CT HEAD LTD  08/31/2023   IR PERCUTANEOUS ART THROMBECTOMY/INFUSION INTRACRANIAL INC DIAG ANGIO  08/31/2023   IR US GUIDE VASC ACCESS RIGHT  08/31/2023   RADIOLOGY WITH ANESTHESIA N/A 08/31/2023   Procedure: IR WITH ANESTHESIA;  Surgeon: Radiologist, Medication, MD;  Location: [facility] OR;  Service: Radiology;  Laterality: N/A;   TUBAL LIGATION  1978   vaginal deliveries  x2    reports that she quit smoking about 32 years ago. Her smoking use included cigarettes. She started smoking about 52 years ago. She has never used smokeless tobacco. She reports that she does not currently use alcohol. She reports that she does not use drugs.  Allergies  Allergen Reactions   Alendronate Nausea Only   Codeine Nausea Only   Lansoprazole     REACTION: hurts stomach   Sulfonamide Derivatives     Childhood reaction (doesn't remember)   Adhesive [Tape] Rash    Blisters if on too long    Family History  Problem Relation Age of Onset   Heart failure Mother    Coronary artery disease Mother    Heart attack Mother 74   Diabetes Mother    Heart failure Father    Coronary artery disease Father    Breast cancer Neg Hx     Prior to Admission medications   Medication Sig Start Date End Date Taking? Authorizing Provider  aspirin EC 81 MG tablet Take 1 tablet (81 mg total) by mouth daily. Swallow whole. 09/07/23   Mathews Argyle, NP  Biotin 2500 MCG CAPS Take by mouth 2 (two) times daily.    [provider]  CALCIUM PO Take 750 mg by mouth 2 (two) times daily.     [provider]  Cholecalciferol (VITAMIN D PO) Take 500 Units by mouth 2 (two) times daily.    [provider]  clopidogrel (PLAVIX) 75 MG tablet Take 1 tablet (75 mg total) by mouth daily. 09/07/23   Mathews Argyle, NP  Cyanocobalamin (VITAMIN B 12 PO) Take 1 tablet by mouth daily.    [provider]  glipiZIDE (GLUCOTROL XL) 10 MG 24 hr tablet Take 10 mg by mouth 2 (two) times daily. 07/25/19   [provider]  glucose blood test strip 1 each by Other route as directed. Use as instructed    [provider]  HYDROmorphone (DILAUDID) 4 MG tablet Take 4 mg by mouth every 6 (six) hours as needed for moderate pain (pain score 4-6). 08/22/20   [provider]  ibuprofen (ADVIL) 200 MG tablet Take 200 mg by mouth every 6 (six) hours as needed for mild pain (pain score 1-3).    [provider]  insulin glargine (LANTUS SOLOSTAR) 100 UNIT/ML Solostar Pen TAKE 48 UNITS ONCE DAILY. TAKE EACH MORNING. 12/30/21   [provider]  ketoconazole (NIZORAL) 2 % cream Apply to skin folds once daily, twice daily with flares. Patient taking differently: Apply 1 Application topically daily as needed for irritation. 02/04/23   Willeen Niece, MD  Lancet [employer] [facility]) lancets 1 each by Other route as needed. Use as instructed    [provider]  lisinopril (ZESTRIL) 20 MG tablet Take 20 mg by mouth daily.    [provider]  magnesium oxide (MAG-OX) 400 MG tablet Take 1 tablet by mouth daily. 01/02/21   [provider]  metFORMIN (GLUCOPHAGE-XR) 500 MG 24 hr tablet Take 1,000 mg by mouth 2 (two) times daily. 02/25/18   [provider]  metoprolol tartrate (LOPRESSOR) 50 MG tablet Take 50 mg by mouth 2 (two) times daily. 07/30/19   [provider]  pantoprazole (PROTONIX) 40 MG tablet Take 40 mg by mouth 2 (two) times daily. 01/08/21   [provider]  pimecrolimus (ELIDEL) 1 % cream Apply  to buttocks area twice daily until improved. Patient not taking: Reported on 08/31/2023 02/10/23  Willeen Niece, MD  simvastatin (ZOCOR) 20 MG tablet Take 0.5 tablets (10 mg total) by mouth at bedtime. Patient taking differently: Take 20 mg by mouth at bedtime. 08/11/19 08/31/23  Ihor Austin, MD   Vitals:   09/07/23 2130 09/07/23 2200 09/07/23 2230 09/07/23 2327  BP: (!) 171/60 (!) 163/55 (!) 171/61 (!) 171/60  Pulse: 79 75 70 69  Resp: (!) 21 (!) 21 19   Temp:    98 F (36.7 C)  TempSrc:    Oral  SpO2: 97% 94% 96% 97%  Weight:      Height:       Physical Exam Vitals and nursing note reviewed.  Constitutional:      General: She is awake. She is not in acute distress.    Appearance: Normal appearance. She is not ill-appearing.  HENT:     Head: Normocephalic and atraumatic.     Right Ear: Hearing and external ear normal.     Left Ear: Hearing and external ear normal.     Nose: Nose normal.     Mouth/Throat:     Lips: Pink.     Tongue: No lesions. Tongue does not deviate from midline.     Pharynx: Oropharynx is clear.  Eyes:     General: Lids are normal.     Extraocular Movements: Extraocular movements intact.  Neck:     Vascular: Carotid bruit present.  Cardiovascular:     Rate and Rhythm: Normal rate and regular rhythm.     Pulses:          Dorsalis pedis pulses are 1+ on the right side and 1+ on the left side.       Posterior tibial pulses are 1+ on the right side and 1+ on the left side.     Heart sounds: Murmur heard.     Systolic murmur is present with a grade of 4/6.  Pulmonary:     Effort: Pulmonary effort is normal.     Breath sounds: Normal breath sounds. No wheezing or rhonchi.  Abdominal:     General: Abdomen is protuberant. Bowel sounds are normal. There is no distension.     Palpations: Abdomen is soft. There is no mass.     Tenderness: There is no abdominal tenderness.  Musculoskeletal:     Right lower leg: No edema.     Left lower leg: No edema.   Skin:    General: Skin is warm.  Neurological:     General: No focal deficit present.     Mental Status: She is alert and oriented to person, place, and time.     Cranial Nerves: Cranial nerves 2-12 are intact. No dysarthria or facial asymmetry.     Motor: No weakness, tremor or atrophy.     Coordination: Coordination normal. Finger-Nose-Finger Test normal.     Deep Tendon Reflexes:     Reflex Scores:      Bicep reflexes are 2+ on the right side and 2+ on the left side.      Patellar reflexes are 1+ on the right side and 1+ on the left side. Psychiatric:        Attention and Perception: Attention normal.        Mood and Affect: Mood normal.        Speech: Speech normal.        Behavior: Behavior normal. Behavior is cooperative.     Labs on Admission: I have personally reviewed following labs and imaging studies Results for  orders placed or performed during the hospital encounter of 09/07/23 (from the past 24 hour(s))  CBG monitoring, ED     Status: Abnormal   Collection Time: 09/07/23  4:15 PM  Result Value Ref Range   Glucose-Capillary 141 (H) 70 - 99 mg/dL  Protime-INR     Status: None   Collection Time: 09/07/23  4:35 PM  Result Value Ref Range   Prothrombin Time 13.5 11.4 - 15.2 seconds   INR 1.0 0.8 - 1.2  APTT     Status: None   Collection Time: 09/07/23  4:35 PM  Result Value Ref Range   aPTT 30 24 - 36 seconds  CBC     Status: None   Collection Time: 09/07/23  4:35 PM  Result Value Ref Range   WBC 9.3 4.0 - 10.5 K/uL   RBC 4.67 3.87 - 5.11 MIL/uL   Hemoglobin 14.6 12.0 - 15.0 g/dL   HCT 21.3 08.6 - 57.8 %   MCV 94.4 80.0 - 100.0 fL   MCH 31.3 26.0 - 34.0 pg   MCHC 33.1 30.0 - 36.0 g/dL   RDW 46.9 62.9 - 52.8 %   Platelets 288 150 - 400 K/uL   nRBC 0.0 0.0 - 0.2 %  Differential     Status: None   Collection Time: 09/07/23  4:35 PM  Result Value Ref Range   Neutrophils Relative % 62 %   Neutro Abs 5.8 1.7 - 7.7 K/uL   Lymphocytes Relative 25 %   Lymphs Abs  2.3 0.7 - 4.0 K/uL   Monocytes Relative 10 %   Monocytes Absolute 0.9 0.1 - 1.0 K/uL   Eosinophils Relative 3 %   Eosinophils Absolute 0.2 0.0 - 0.5 K/uL   Basophils Relative 0 %   Basophils Absolute 0.0 0.0 - 0.1 K/uL   Immature Granulocytes 0 %   Abs Immature Granulocytes 0.03 0.00 - 0.07 K/uL  Comprehensive metabolic panel     Status: Abnormal   Collection Time: 09/07/23  4:35 PM  Result Value Ref Range   Sodium 139 135 - 145 mmol/L   Potassium 3.7 3.5 - 5.1 mmol/L   Chloride 105 98 - 111 mmol/L   CO2 23 22 - 32 mmol/L   Glucose, Bld 162 (H) 70 - 99 mg/dL   BUN 17 8 - 23 mg/dL   Creatinine, Ser 4.13 0.44 - 1.00 mg/dL   Calcium 9.1 8.9 - 24.4 mg/dL   Total Protein 7.3 6.5 - 8.1 g/dL   Albumin 4.1 3.5 - 5.0 g/dL   AST 37 15 - 41 U/L   ALT 35 0 - 44 U/L   Alkaline Phosphatase 43 38 - 126 U/L   Total Bilirubin 0.9 0.3 - 1.2 mg/dL   GFR, Estimated >01 >02 mL/min   Anion gap 11 5 - 15  Ethanol     Status: None   Collection Time: 09/07/23  4:35 PM  Result Value Ref Range   Alcohol, Ethyl (B) <10 <10 mg/dL  Ammonia     Status: None   Collection Time: 09/07/23  5:59 PM  Result Value Ref Range   Ammonia <10 9 - 35 umol/L  Troponin I (High Sensitivity)     Status: None   Collection Time: 09/07/23  7:02 PM  Result Value Ref Range   Troponin I (High Sensitivity) 8 <18 ng/L  D-dimer, quantitative     Status: Abnormal   Collection Time: 09/07/23  7:02 PM  Result Value Ref Range   D-Dimer,  Quant 0.51 (H) 0.00 - 0.50 ug/mL-FEU  Lipid panel     Status: Abnormal   Collection Time: 09/07/23  7:02 PM  Result Value Ref Range   Cholesterol 146 0 - 200 mg/dL   Triglycerides 366 (H) <150 mg/dL   HDL 53 >44 mg/dL   Total CHOL/HDL Ratio 2.8 RATIO   VLDL 35 0 - 40 mg/dL   LDL Cholesterol 58 0 - 99 mg/dL  Urinalysis, Complete w Microscopic -Urine, Clean Catch     Status: Abnormal   Collection Time: 09/07/23  8:18 PM  Result Value Ref Range   Color, Urine YELLOW (A) YELLOW   APPearance  CLEAR (A) CLEAR   Specific Gravity, Urine >1.046 (H) 1.005 - 1.030   pH 5.0 5.0 - 8.0   Glucose, UA NEGATIVE NEGATIVE mg/dL   Hgb urine dipstick NEGATIVE NEGATIVE   Bilirubin Urine NEGATIVE NEGATIVE   Ketones, ur 5 (A) NEGATIVE mg/dL   Protein, ur NEGATIVE NEGATIVE mg/dL   Nitrite NEGATIVE NEGATIVE   Leukocytes,Ua TRACE (A) NEGATIVE   RBC / HPF 0-5 0 - 5 RBC/hpf   WBC, UA 0-5 0 - 5 WBC/hpf   Bacteria, UA NONE SEEN NONE SEEN   Squamous Epithelial / HPF 0-5 0 - 5 /HPF   Mucus PRESENT   Urine Drug Screen, Qualitative ([facility] only)     Status: None   Collection Time: 09/07/23  9:18 PM  Result Value Ref Range   Tricyclic, Ur Screen NONE DETECTED NONE DETECTED   Amphetamines, Ur Screen NONE DETECTED NONE DETECTED   MDMA (Ecstasy)Ur Screen NONE DETECTED NONE DETECTED   Cocaine Metabolite,Ur [place] NONE DETECTED NONE DETECTED   Opiate, Ur Screen NONE DETECTED NONE DETECTED   Phencyclidine (PCP) Ur S NONE DETECTED NONE DETECTED   Cannabinoid 50 Ng, Ur [place] NONE DETECTED NONE DETECTED   Barbiturates, Ur Screen NONE DETECTED NONE DETECTED   Benzodiazepine, Ur Scrn NONE DETECTED NONE DETECTED   Methadone Scn, Ur NONE DETECTED NONE DETECTED   CBC: Recent Labs  Lab 09/01/23 0656 09/06/23 0548 09/07/23 1635  WBC 14.2* 9.7 9.3  NEUTROABS  --   --  5.8  HGB 11.9* 13.3 14.6  HCT 35.4* 39.3 44.1  MCV 93.9 91.6 94.4  PLT 222 267 288   Basic Metabolic Panel: Recent Labs  Lab 09/01/23 0656 09/06/23 0548 09/07/23 1635  NA 140 140 139  K 3.7 3.8 3.7  CL 105 105 105  CO2 24 25 23   GLUCOSE 131* 119* 162*  BUN 11 15 17   CREATININE 0.72 0.71 0.69  CALCIUM 8.3* 9.2 9.1   GFR: Estimated Creatinine Clearance: 54.3 mL/min (by C-G formula based on SCr of 0.69 mg/dL). Liver Function Tests: Recent Labs  Lab 09/01/23 0656 09/07/23 1635  AST 23 37  ALT 23 35  ALKPHOS 25* 43  BILITOT 0.6 0.9  PROT 5.5* 7.3  ALBUMIN 3.1* 4.1   No results for input(s): "LIPASE", "AMYLASE" in the last 168  hours. Recent Labs  Lab 09/07/23 1759  AMMONIA <10   Coagulation Profile: Recent Labs  Lab 09/07/23 1635  INR 1.0   Cardiac Enzymes: No results for input(s): "CKTOTAL", "CKMB", "CKMBINDEX", "TROPONINI" in the last 168 hours. BNP (last 3 results) No results for input(s): "PROBNP" in the last 8760 hours. HbA1C: No results for input(s): "HGBA1C" in the last 72 hours. CBG: Recent Labs  Lab 09/05/23 1637 09/05/23 2107 09/06/23 0608 09/06/23 1117 09/07/23 1615  GLUCAP 228* 113* 131* 178* 141*   Lipid Profile: Recent Labs  09/07/23 1902  CHOL 146  HDL 53  LDLCALC 58  TRIG 175*  CHOLHDL 2.8   Thyroid Function Tests: No results for input(s): "TSH", "T4TOTAL", "FREET4", "T3FREE", "THYROIDAB" in the last 72 hours. Anemia Panel: No results for input(s): "VITAMINB12", "FOLATE", "FERRITIN", "TIBC", "IRON", "RETICCTPCT" in the last 72 hours. Urinalysis    Component Value Date/Time   COLORURINE YELLOW (A) 09/07/2023 2018   APPEARANCEUR CLEAR (A) 09/07/2023 2018   LABSPEC >1.046 (H) 09/07/2023 2018   PHURINE 5.0 09/07/2023 2018   GLUCOSEU NEGATIVE 09/07/2023 2018   HGBUR NEGATIVE 09/07/2023 2018   BILIRUBINUR NEGATIVE 09/07/2023 2018   KETONESUR 5 (A) 09/07/2023 2018   PROTEINUR NEGATIVE 09/07/2023 2018   NITRITE NEGATIVE 09/07/2023 2018   LEUKOCYTESUR TRACE (A) 09/07/2023 2018   Unresulted Labs (From admission, onward)     Start     Ordered   09/07/23 1727  Vitamin B12  Once,   URGENT        09/07/23 1730           Medications  sodium chloride flush (NS) 0.9 % injection 10 mL (10 mLs Intravenous Not Given 09/07/23 2113)  acetaminophen (TYLENOL) tablet 650 mg (650 mg Oral Given 09/07/23 2222)    Or  acetaminophen (TYLENOL) 160 MG/5ML solution 650 mg ( Per Tube See Alternative 09/07/23 2222)    Or  acetaminophen (TYLENOL) suppository 650 mg ( Rectal See Alternative 09/07/23 2222)  clopidogrel (PLAVIX) tablet 75 mg (has no administration in time range)   aspirin EC tablet 81 mg (has no administration in time range)  metoprolol tartrate (LOPRESSOR) tablet 25 mg (has no administration in time range)  lisinopril (ZESTRIL) tablet 10 mg (has no administration in time range)  pantoprazole (PROTONIX) EC tablet 40 mg (has no administration in time range)  rosuvastatin (CRESTOR) tablet 40 mg (has no administration in time range)  insulin aspart (novoLOG) injection 0-15 Units (has no administration in time range)  hydrALAZINE (APRESOLINE) injection 5 mg (has no administration in time range)  sodium chloride flush (NS) 0.9 % injection 3 mL (3 mLs Intravenous Given 09/07/23 1710)  iohexol (OMNIPAQUE) 350 MG/ML injection 75 mL (75 mLs Intravenous Contrast Given 09/07/23 1638)   stroke: early stages of recovery book ( Does not apply Given 09/08/23 0012)   Radiological Exams on Admission: MR BRAIN WO CONTRAST  Result Date: 09/07/2023 CLINICAL DATA:  Stroke EXAM: MRI HEAD WITHOUT CONTRAST TECHNIQUE: Multiplanar, multiecho pulse sequences of the brain and surrounding structures were obtained without intravenous contrast. COMPARISON:  09/01/2023 FINDINGS: Brain: Much of the previously seen abnormal diffusion restriction has resolved. There is persistent abnormality at the left caudate tail, left precentral gyrus and left medial frontal gyrus with a new focus in the left centrum semiovale (series 5, image 36). Petechial blood products at the superior left hemisphere (series 10, image 52). There is multifocal hyperintense T2-weighted signal within the white matter. Parenchymal volume and CSF spaces are normal. The midline structures are normal. Vascular: Normal flow voids. Skull and upper cervical spine: Normal calvarium and skull base. Visualized upper cervical spine and soft tissues are normal. Sinuses/Orbits:No paranasal sinus fluid levels or advanced mucosal thickening. No mastoid or middle ear effusion. Normal orbits. IMPRESSION: 1. Much of the previously seen  abnormal diffusion restriction has resolved. Persistent diffusion restriction of the left precentral gyrus, caudate tail and medial frontal gyrus. 2. New focus of acute ischemia at the left centrum semiovale. 3. Petechial blood products at the superior left hemisphere. Electronically Signed   By:  Deatra Robinson M.D.   On: 09/07/2023 21:33   DG Chest 1 View  Result Date: 09/07/2023 CLINICAL DATA:  Cough EXAM: CHEST  1 VIEW COMPARISON:  01/29/2021 FINDINGS: Upper normal cardiac size. Dense mitral annular calcification. No acute airspace disease, pleural effusion or pneumothorax. Aortic atherosclerosis. IMPRESSION: No active disease. Electronically Signed   By: Jasmine Pang M.D.   On: 09/07/2023 20:59   CT ANGIO HEAD NECK W WO CM (CODE STROKE)  Result Date: 09/07/2023 CLINICAL DATA:  Neuro deficit, acute, stroke suspected. Right arm numbness. Transient word-finding difficulty. EXAM: CT ANGIOGRAPHY HEAD AND NECK WITH AND WITHOUT CONTRAST TECHNIQUE: Multidetector CT imaging of the head and neck was performed using the standard protocol during bolus administration of intravenous contrast. Multiplanar CT image reconstructions and MIPs were obtained to evaluate the vascular anatomy. Carotid stenosis measurements (when applicable) are obtained utilizing NASCET criteria, using the distal internal carotid diameter as the denominator. RADIATION DOSE REDUCTION: This exam was performed according to the departmental dose-optimization program which includes automated exposure control, adjustment of the mA and/or kV according to patient size and/or use of iterative reconstruction technique. CONTRAST:  75mL OMNIPAQUE IOHEXOL 350 MG/ML SOLN COMPARISON:  Head CT 09/07/2023.  CTA head/neck 09/01/2023. FINDINGS: CTA NECK FINDINGS Aortic arch: Three-vessel arch configuration. Atherosclerotic calcifications of the aortic arch and arch vessel origins. Arch vessel origins are patent. Right carotid system: No evidence of dissection,  stenosis (50% or greater), or occlusion. Mild calcified plaque along the right carotid bulb. Left carotid system: No evidence of dissection, stenosis (50% or greater), or occlusion. Mild calcified plaque along the left carotid bulb and proximal left cervical ICA. Vertebral arteries: Right-dominant. No evidence of dissection, stenosis (50% or greater), or occlusion. The left vertebral artery functionally terminates in PICA. Skeleton: Mild cervical spondylosis without high-grade spinal canal stenosis. Other neck: Unremarkable. Upper chest: Unremarkable. Review of the MIP images confirms the above findings CTA HEAD FINDINGS Anterior circulation: Calcified plaque along the carotid siphons without hemodynamically significant stenosis. The proximal ACAs and MCAs are patent without stenosis or aneurysm. Distal branches are symmetric. Posterior circulation: Normal basilar artery. The SCAs, AICAs and PICAs are patent proximally. The PCAs are patent proximally without stenosis or aneurysm. Distal branches are symmetric. Venous sinuses: As permitted by contrast timing, patent. Anatomic variants: None. Review of the MIP images confirms the above findings IMPRESSION: 1. No large vessel occlusion or hemodynamically significant stenosis in the head or neck. 2. Mild atherosclerosis of the carotid arteries. Aortic Atherosclerosis (ICD10-I70.0). Code stroke imaging results were communicated on 09/07/2023 at 4:51 pm to provider Dr. Roda Shutters via secure text paging. Electronically Signed   By: Orvan Falconer M.D.   On: 09/07/2023 16:56   CT HEAD CODE STROKE WO CONTRAST  Result Date: 09/07/2023 CLINICAL DATA:  Code stroke.  Neuro deficit, acute, stroke suspected EXAM: CT HEAD WITHOUT CONTRAST TECHNIQUE: Contiguous axial images were obtained from the base of the skull through the vertex without intravenous contrast. RADIATION DOSE REDUCTION: This exam was performed according to the departmental dose-optimization program which includes  automated exposure control, adjustment of the mA and/or kV according to patient size and/or use of iterative reconstruction technique. COMPARISON:  CT headOctober 22, 2024. FINDINGS: Brain: No evidence of acute large vascular territory infarction, hemorrhage, hydrocephalus, extra-axial collection or mass lesion/mass effect. Patchy white matter hypodensities are nonspecific but compatible with chronic microvascular ischemic disease. Vascular: No hyperdense vessel identified.  Calcific atherosclerosis Skull: Normal. Negative for fracture or focal lesion. Sinuses/Orbits: No acute finding.  ASPECTS [facility] Stroke Program Early CT Score) total score (0-10 with 10 being normal): 10 IMPRESSION: 1. No evidence of acute intracranial abnormality. 2. ASPECTS is 10. Code stroke imaging results were communicated on 09/07/2023 at 4:42 pm to provider Dr. Roda Shutters via secure text paging. Electronically Signed   By: Feliberto Harts M.D.   On: 09/07/2023 16:42    Data Reviewed: Relevant notes from primary care and specialist visits, past discharge summaries as available in EHR, including Care Everywhere. Prior diagnostic testing as pertinent to current admission diagnoses Updated medications and problem lists for reconciliation ED course, including vitals, labs, imaging, treatment and response to treatment Triage notes, nursing and pharmacy notes and ED provider's notes Notable results as noted in HPI  Assessment and Plan: * Aphasia Stat MRI ordered to identify any new ischemic or hemorrhagic process.    Acute ischemic stroke [facility]) MRI shows new focus of acute ischemia at the left centrum semiovale, previously seen abnormal diffusion restriction has resolved, persistent abnormality at the left caudate tail left precentral gyrus and left medial frontal gyrus with a new focus in the left centrum semiovale as described above, petechial blood products of the superior left hemisphere. Continue patient's DAPT and crestor  from  tomorrow. Will keep blood pressure between 140s and 150s. Metoprolol at 25 mg bid and lisinopril at half existing dose to prevent hypotension.  Will avoid ibuprofen will continue with heparin every 12 for DVT prophylaxis. Aspiration, fall, speech, occupational therapy, physical therapy. EEG.  D/W husband at 1:10 AM about her MRI result showing positive for stroke.  We will get Vq scan in am as well to identify PE as pt had c/o chest tightness.   Abnormal TSH Will continue patient on levothyroxine and check patient's free T4 and TSH levels.  Essential hypertension Vitals:   09/07/23 1651 09/07/23 1653 09/07/23 1927 09/07/23 1930  BP: (!) 183/61 (!) 190/68 (!) 162/57 (!) 162/52   09/07/23 2000 09/07/23 2030 09/07/23 2100 09/07/23 2130  BP: (!) 160/51 (!) 161/60 (!) 176/56 (!) 171/60   09/07/23 2200 09/07/23 2230 09/07/23 2327  BP: (!) 163/55 (!) 171/61 (!) 171/60  Patient started on metoprolol 25 mg twice a day and lisinopril 10 mg daily.  As needed hydralazine for hypertension with goal blood pressure of 140s and 150s systolic.   Hyperlipidemia Will add lipid panel to current existing blood work and start patient on Crestor 40 mg.  DM (diabetes mellitus) (HCC) Glycemic protocol with q4 hours accucheck as needed.  Home regimen of insulin and glipizide and metformin are currently held to prevent any CIN any acidosis and any hypoglycemia.    DVT prophylaxis:  Heparin q12h.  Consults:  Neurology: Dr.Kirkpatrick.   Advance Care Planning:    Code Status: Full Code  Family Communication:   Spouse at bedside.  Disposition Plan:  Home.   Severity of Illness: The appropriate patient status for this patient is OBSERVATION. Observation status is judged to be reasonable and necessary in order to provide the required intensity of service to ensure the patient's safety. The patient's presenting symptoms, physical exam findings, and initial radiographic and laboratory data in the context  of their medical condition is felt to place them at decreased risk for further clinical deterioration. Furthermore, it is anticipated that the patient will be medically stable for discharge from the hospital within 2 midnights of admission.   Author: Gertha Calkin, MD 09/08/2023 1:13 AM  For on call review [email or web address].

## 2023-09-07 NOTE — Progress Notes (Signed)
Telestroke RN Code Stroke Note   1617- Stroke cart activation- EDP at bedside assessing.  1623- Pt taken for CT 1623- Dr Roda Shutters on stroke cart camera in CT dept.  1638- Pt taken back to room. NIH =3 Slight right facial droop, slight expressive aphasia, right upper extremity mild dysmetria   MRS 3 LKW 1410 No TNK d/t recent stroke

## 2023-09-07 NOTE — Progress Notes (Signed)
   09/07/23 1915  Spiritual Encounters  Type of Visit Follow up  Care provided to: Pt and family  Reason for visit Routine spiritual support  OnCall Visit Yes   Chaplain provided follow up support to patient and family.

## 2023-09-07 NOTE — ED Notes (Signed)
Patient ambulated with standby assistance by this RN to toilet. Patient ambulated using the walker and maintained a steady gait throughout the transport. Patient was returned to bed and placed back on the cardiac monitor.

## 2023-09-07 NOTE — Code Documentation (Addendum)
Stroke Response Nurse Documentation Code Documentation  Molly [name] is a 82 y.o. female arriving to [facility] via [facility] on 09/07/2023 with past medical hx of DM, HTN, GERD, status post TNK and thrombectomy for left MCA occlusion with TICI2b reperfusion discharged 10/27 . On aspirin 81 mg daily and clopidogrel 75 mg daily. Code stroke was activated by EMS.   Patient from home where she was LKW at 1400 and now complaining of acute onset worsening aphasia. Per family, patient was walking with walker without assistance, conversing per at baseline from discharge yesterday. Around 3:15 her friend was calling her on the phone and she was unable to speak for form sentences, at which time EMS was called. Pt reported  right arm numbness today that had resolved earlier this morning around 10a. EMS BP: 178/82  Stroke team at the bedside on patient arrival. Labs drawn and patient cleared for CT by Dr. Rosalia Hammers. Patient to CT with team. NIHSS 4, see documentation for details and code stroke times. Patient with right facial droop, right limb ataxia, and Expressive aphasia  on exam. The following imaging was completed:  CT Head and CTA. Patient is not a candidate for IV Thrombolytic due to recent stroke, per MD. Patient is not a candidate for IR due to no LVO on imaging, Per MD.   Care Plan: every 2 hour NIHSS and VS. Swallow screen per protocol   Bedside handoff with ED RN [facility].    Wille Glaser  Stroke Response RN

## 2023-09-07 NOTE — ED Triage Notes (Addendum)
C/O right arm numbness today, intermttently.  C/O difficulty getting words out x 15 minutes. REcent stroke, discharged from [facility].  AAOx3.  Skin warm and dry.  MAE equally and strong. No numbness currently. Sensation equal.  Difficulty retrieving month.  Delays in answering questions.

## 2023-09-07 NOTE — Consult Note (Signed)
[facility] Telemedicine Consult   Requesting Provider: Dr. Rosalia Hammers  Chief Complaint: Aphasia  HPI: 82 year old female with history of diabetes, hypertension, hyperlipidemia, GERD admitted recently in [facility] for right-sided weakness and dysarthria.  Status post TNK and thrombectomy for left MCA occlusion with TICI2b reperfusion.  MRI showed patchy left MCA and bilateral ACA scattered infarcts.  Repeat CT head and neck showed left MCA patent.  EF 60 to 65%.  LDL 63 and A1c 6.7.  Her symptoms resolved and neurologic intact on discharge yesterday on 10/27.  She was discharged on aspirin and Plavix as well as Crestor 20.  Recommended 30-day CardioNet monitoring as outpatient to rule out A-fib.  After discharge, patient doing well at home, walking with walker without assistance, able to converse without problem.  Last seen well 2 PM.  Around 3:15 PM, patient's friend calling her on the phone, she pick up the phone and was not able to talk, double speech, not able to form sentences.  However, no weakness or facial droop noted.  Family called 911, on EMS arrival BP 136/82, glucose 141.  In ED, patient symptoms much improved, starting to talk gradually improving.  Still complaining some right arm and leg tingling feeling with slight right facial droop.  CT no acute abnormality, no ICH or SAH.  CT head and neck no LVO, unchanged from last CTA which showed left MCA patent.  LKW: 210 tpa given?: No, recent stroke, mild symptoms IR Thrombectomy? No, no LVO Modified Rankin Scale: 3-Moderate disability-requires help but walks WITHOUT assistance   Exam: Vitals:   09/07/23 1648  Pulse: 86  Resp: 14  Temp: 98.5 F (36.9 C)  SpO2: 98%     Temp:  [98.5 F (36.9 C)] 98.5 F (36.9 C) (10/28 1648) Pulse Rate:  [86] 86 (10/28 1648) Resp:  [14] 14 (10/28 1648) SpO2:  [98 %] 98 % (10/28 1648) Weight:  [88 kg] 88 kg (10/28 1648)  General - Well nourished, well developed, in no apparent  distress, slight lethargy.  Ophthalmologic - fundi not visualized due to noncooperation.  Cardiovascular - Regular rhythm and rate.  Neuro - awake, alert, eyes open, orientated to age, place, time and people. Slight hesitancy of speech and not quite fluent, but following all simple commands. Able to name and read. No gaze palsy, tracking bilaterally, visual field full.  Slight right facial droop. Tongue midline. Bilateral UEs 5/5, no drift. Bilaterally LEs 5/5, no drift. Sensation initially stated right upper and lower extremity tingling but on repeat testing symmetrical bilaterally, right FTN mild dysmetria, gait not tested.  No neglect  NIH Stroke Scale = 3 (Slight right facial droop, slight expressive aphasia, right upper extremity mild dysmetria)   Imaging Reviewed:  CT ANGIO HEAD NECK W WO CM (CODE STROKE)  Result Date: 09/07/2023 CLINICAL DATA:  Neuro deficit, acute, stroke suspected. Right arm numbness. Transient word-finding difficulty. EXAM: CT ANGIOGRAPHY HEAD AND NECK WITH AND WITHOUT CONTRAST TECHNIQUE: Multidetector CT imaging of the head and neck was performed using the standard protocol during bolus administration of intravenous contrast. Multiplanar CT image reconstructions and MIPs were obtained to evaluate the vascular anatomy. Carotid stenosis measurements (when applicable) are obtained utilizing NASCET criteria, using the distal internal carotid diameter as the denominator. RADIATION DOSE REDUCTION: This exam was performed according to the departmental dose-optimization program which includes automated exposure control, adjustment of the mA and/or kV according to patient size and/or use of iterative reconstruction technique. CONTRAST:  75mL OMNIPAQUE IOHEXOL 350 MG/ML SOLN  COMPARISON:  Head CT 09/07/2023.  CTA head/neck 09/01/2023. FINDINGS: CTA NECK FINDINGS Aortic arch: Three-vessel arch configuration. Atherosclerotic calcifications of the aortic arch and arch vessel origins.  Arch vessel origins are patent. Right carotid system: No evidence of dissection, stenosis (50% or greater), or occlusion. Mild calcified plaque along the right carotid bulb. Left carotid system: No evidence of dissection, stenosis (50% or greater), or occlusion. Mild calcified plaque along the left carotid bulb and proximal left cervical ICA. Vertebral arteries: Right-dominant. No evidence of dissection, stenosis (50% or greater), or occlusion. The left vertebral artery functionally terminates in PICA. Skeleton: Mild cervical spondylosis without high-grade spinal canal stenosis. Other neck: Unremarkable. Upper chest: Unremarkable. Review of the MIP images confirms the above findings CTA HEAD FINDINGS Anterior circulation: Calcified plaque along the carotid siphons without hemodynamically significant stenosis. The proximal ACAs and MCAs are patent without stenosis or aneurysm. Distal branches are symmetric. Posterior circulation: Normal basilar artery. The SCAs, AICAs and PICAs are patent proximally. The PCAs are patent proximally without stenosis or aneurysm. Distal branches are symmetric. Venous sinuses: As permitted by contrast timing, patent. Anatomic variants: None. Review of the MIP images confirms the above findings IMPRESSION: 1. No large vessel occlusion or hemodynamically significant stenosis in the head or neck. 2. Mild atherosclerosis of the carotid arteries. Aortic Atherosclerosis (ICD10-I70.0). Code stroke imaging results were communicated on 09/07/2023 at 4:51 pm to provider Dr. Roda Shutters via secure text paging. Electronically Signed   By: Orvan Falconer M.D.   On: 09/07/2023 16:56   CT HEAD CODE STROKE WO CONTRAST  Result Date: 09/07/2023 CLINICAL DATA:  Code stroke.  Neuro deficit, acute, stroke suspected EXAM: CT HEAD WITHOUT CONTRAST TECHNIQUE: Contiguous axial images were obtained from the base of the skull through the vertex without intravenous contrast. RADIATION DOSE REDUCTION: This exam was  performed according to the departmental dose-optimization program which includes automated exposure control, adjustment of the mA and/or kV according to patient size and/or use of iterative reconstruction technique. COMPARISON:  CT headOctober 22, 2024. FINDINGS: Brain: No evidence of acute large vascular territory infarction, hemorrhage, hydrocephalus, extra-axial collection or mass lesion/mass effect. Patchy white matter hypodensities are nonspecific but compatible with chronic microvascular ischemic disease. Vascular: No hyperdense vessel identified.  Calcific atherosclerosis Skull: Normal. Negative for fracture or focal lesion. Sinuses/Orbits: No acute finding. ASPECTS [facility] Stroke Program Early CT Score) total score (0-10 with 10 being normal): 10 IMPRESSION: 1. No evidence of acute intracranial abnormality. 2. ASPECTS is 10. Code stroke imaging results were communicated on 09/07/2023 at 4:42 pm to provider Dr. Roda Shutters via secure text paging. Electronically Signed   By: Feliberto Harts M.D.   On: 09/07/2023 16:42     Labs reviewed in epic and pertinent values follow: Creatinine 0.69, WBC 9.3, platelet 288, hemoglobin 14.6  Assessment:  82 year old female with history of diabetes, hypertension, hyperlipidemia, GERD just discharged yesterday from [facility] for left MCA and bilateral ACA patchy scattered infarcts.  She received TNK and mechanical thrombectomy, and symptoms much improved and discharged with intact neuroexam yesterday.  After discharge, patient doing well, last seen well 2 PM today.  Around 3:15 PM, patient had acute onset aphasia while talking to friends over the phone.  However, in ED, patient symptoms much improved, starting to talk gradually improving.   CT no acute abnormality, no ICH or SAH.  CT head and neck no LVO, unchanged from last CTA which showed left MCA patent.  NIH score 3.  Not TNK candidate due to recent  stroke and mild symptoms.  No IR due to no LVO.   Etiology for  patient's symptoms not quite clear, could be TIA, recurrent minor stroke or recrudescence from metabolic encephalopathy.  Will repeat MRI to rule out stroke.  May consider EEG to rule out seizure.  Check CXR and UA and ammonia for encephalopathy workup.   Recommendations:  -Admission vs observation for further workup -MRI brain to rule out stroke -EEG to rule out seizure -CXR, UA and ammonia level -Neurocheck and vital signs every 2 hours -Patient had home medication today, continue home medication tomorrow including DAPT and statin -If passes bedside swallow screening, please put on diet -discussed with EDP Dr. Rosalia Hammers -Will follow   Consult Participants: Patient, son and husband, stroke response nurse, RN, me Location of the provider: [facility] Location of the patient: [facility]  Time Code Stroke Page received: 4:20 PM Time neurologist arrived: 4:22 PM Time NIHSS completed: 4:38 PM  This consult was provided via telemedicine with 2-way video and audio communication. The patient/family was informed that care would be provided in this way and agreed to receive care in this manner.   This patient is receiving care for possible acute neurological changes. There was 60 minutes of care by this provider at the time of service, including time for direct evaluation via telemedicine, review of medical records, imaging studies and discussion of findings with providers, the patient and/or family.  Marvel Plan, MD PhD Stroke Neurology 09/07/2023 5:05 PM

## 2023-09-07 NOTE — ED Triage Notes (Addendum)
Patient to ED via [facility] from home. Patient's family noticed that the patient was having trouble talking on the phone around 1400 today so they decided to call EMS. Patient has Hx of stroke with surgical removal of the thrombus last Monday 10/21. Patient also states she had tingling and numbness in her R hand last night. Patient is currently taking Plavix and aspirin.

## 2023-09-07 NOTE — ED Notes (Addendum)
Pt to room, neurotele at bedside. Family at bedside

## 2023-09-07 NOTE — ED Notes (Signed)
Patient to MRI now

## 2023-09-07 NOTE — Progress Notes (Signed)
   09/07/23 1600  Spiritual Encounters  Type of Visit Initial  Care provided to: Family   Chaplain received a code Stroke and met with the patients family in the ED while she received CT Scan. Chaplain offered patients husband and sons words of encouragement. Patients husband was happy to see the chaplain because I was with them last time the patient came in for a Stroke. Chaplain referred family to oncoming chaplain and he went with the family to patients room for updates.

## 2023-09-07 NOTE — Progress Notes (Signed)
   09/07/23 1700  Spiritual Encounters  Type of Visit Initial  Care provided to: Family  Referral source Chaplain team  Reason for visit Code  OnCall Visit Yes   This Chaplain received hand-off from previous chaplain to care for family of patient after a Code Stroke. Chaplain escorted family to bedside where care team was tending to patient and waiting for family.

## 2023-09-07 NOTE — ED Notes (Signed)
Family (son) is very "annoyed", because patient is still in the emergency department. Family educated that the hospital has been very full, but we are doing our best to get an inpt room for her.  Son also c/o that it is "taking a long time for the nurse to come into the room when the call light button is presses".  Family educated that this nurse has 3 other patients, but this nurse answers the call light as soon as she is able to.  Family states VU.  Charge nurse notified that family is upset about still being in the ER.

## 2023-09-08 ENCOUNTER — Observation Stay: Payer: PPO

## 2023-09-08 ENCOUNTER — Encounter: Payer: Self-pay | Admitting: Internal Medicine

## 2023-09-08 DIAGNOSIS — R569 Unspecified convulsions: Secondary | ICD-10-CM | POA: Diagnosis not present

## 2023-09-08 DIAGNOSIS — R0600 Dyspnea, unspecified: Secondary | ICD-10-CM | POA: Diagnosis not present

## 2023-09-08 DIAGNOSIS — R4701 Aphasia: Secondary | ICD-10-CM | POA: Diagnosis not present

## 2023-09-08 LAB — GLUCOSE, CAPILLARY
Glucose-Capillary: 177 mg/dL — ABNORMAL HIGH (ref 70–99)
Glucose-Capillary: 258 mg/dL — ABNORMAL HIGH (ref 70–99)
Glucose-Capillary: 274 mg/dL — ABNORMAL HIGH (ref 70–99)
Glucose-Capillary: 289 mg/dL — ABNORMAL HIGH (ref 70–99)
Glucose-Capillary: 280 mg/dL — ABNORMAL HIGH (ref 70–99)

## 2023-09-08 LAB — VITAMIN B12: Vitamin B-12: 656 pg/mL (ref 180–914)

## 2023-09-08 MED ORDER — ASPIRIN 81 MG PO TBEC
81.0000 mg | DELAYED_RELEASE_TABLET | Freq: Every day | ORAL | Status: DC
  Administered 2023-09-08 – 2023-09-09 (×2): 81 mg via ORAL
  Filled 2023-09-08 (×2): qty 1

## 2023-09-08 MED ORDER — HYDRALAZINE HCL 20 MG/ML IJ SOLN: 5.0000 mg | Freq: Four times a day (QID) | INTRAMUSCULAR | Status: DC | PRN

## 2023-09-08 MED ORDER — PANTOPRAZOLE SODIUM 40 MG PO TBEC
40.0000 mg | DELAYED_RELEASE_TABLET | Freq: Two times a day (BID) | ORAL | Status: DC
  Administered 2023-09-08 – 2023-09-09 (×3): 40 mg via ORAL
  Filled 2023-09-08 (×3): qty 1

## 2023-09-08 MED ORDER — INSULIN GLARGINE-YFGN 100 UNIT/ML ~~LOC~~ SOLN
20.0000 [IU] | Freq: Every day | SUBCUTANEOUS | Status: DC
  Administered 2023-09-09: 20 [IU] via SUBCUTANEOUS
  Filled 2023-09-08 (×2): qty 0.2

## 2023-09-08 MED ORDER — METOPROLOL TARTRATE 25 MG PO TABS
25.0000 mg | ORAL_TABLET | Freq: Two times a day (BID) | ORAL | Status: DC
  Administered 2023-09-09: 25 mg via ORAL
  Filled 2023-09-08: qty 1

## 2023-09-08 MED ORDER — ENOXAPARIN SODIUM 40 MG/0.4ML IJ SOSY
40.0000 mg | PREFILLED_SYRINGE | INTRAMUSCULAR | Status: DC
  Administered 2023-09-08: 40 mg via SUBCUTANEOUS
  Filled 2023-09-08: qty 0.4

## 2023-09-08 MED ORDER — INSULIN ASPART 100 UNIT/ML IJ SOLN: 0.0000 [IU] | INTRAMUSCULAR | Status: DC | PRN

## 2023-09-08 MED ORDER — CLOPIDOGREL BISULFATE 75 MG PO TABS
75.0000 mg | ORAL_TABLET | Freq: Every day | ORAL | Status: DC
  Administered 2023-09-08 – 2023-09-09 (×2): 75 mg via ORAL
  Filled 2023-09-08 (×2): qty 1

## 2023-09-08 MED ORDER — ROSUVASTATIN CALCIUM 20 MG PO TABS
40.0000 mg | ORAL_TABLET | Freq: Every day | ORAL | Status: DC
  Administered 2023-09-08 – 2023-09-09 (×2): 40 mg via ORAL
  Filled 2023-09-08 (×3): qty 2

## 2023-09-08 MED ORDER — METOPROLOL TARTRATE 25 MG PO TABS
25.0000 mg | ORAL_TABLET | Freq: Two times a day (BID) | ORAL | Status: DC
  Administered 2023-09-08: 25 mg via ORAL
  Filled 2023-09-08 (×2): qty 1

## 2023-09-08 MED ORDER — LISINOPRIL 10 MG PO TABS
10.0000 mg | ORAL_TABLET | Freq: Every day | ORAL | Status: DC
  Administered 2023-09-09: 10 mg via ORAL
  Filled 2023-09-08: qty 1

## 2023-09-08 MED ORDER — INSULIN ASPART 100 UNIT/ML IJ SOLN
0.0000 [IU] | Freq: Three times a day (TID) | INTRAMUSCULAR | Status: DC
  Administered 2023-09-08 (×2): 8 [IU] via SUBCUTANEOUS
  Administered 2023-09-08: 3 [IU] via SUBCUTANEOUS
  Administered 2023-09-09: 11 [IU] via SUBCUTANEOUS
  Administered 2023-09-09: 5 [IU] via SUBCUTANEOUS
  Filled 2023-09-08 (×5): qty 1

## 2023-09-08 MED ORDER — TECHNETIUM TO 99M ALBUMIN AGGREGATED
4.0000 | Freq: Once | INTRAVENOUS | Status: AC | PRN
  Administered 2023-09-08: 4.36 via INTRAVENOUS

## 2023-09-08 MED ORDER — LISINOPRIL 10 MG PO TABS
10.0000 mg | ORAL_TABLET | Freq: Every day | ORAL | Status: DC
  Filled 2023-09-08: qty 1

## 2023-09-08 NOTE — Assessment & Plan Note (Signed)
Glycemic protocol with q4 hours accucheck as needed.  Home regimen of insulin and glipizide and metformin are currently held to prevent any CIN any acidosis and any hypoglycemia.

## 2023-09-08 NOTE — Assessment & Plan Note (Signed)
Will add lipid panel to current existing blood work and start patient on Crestor 40 mg.

## 2023-09-08 NOTE — Assessment & Plan Note (Addendum)
MRI shows new focus of acute ischemia at the left centrum semiovale, previously seen abnormal diffusion restriction has resolved, persistent abnormality at the left caudate tail left precentral gyrus and left medial frontal gyrus with a new focus in the left centrum semiovale as described above, petechial blood products of the superior left hemisphere. Continue patient's DAPT and crestor  from tomorrow. Will keep blood pressure between 140s and 150s. Metoprolol at 25 mg bid and lisinopril at half existing dose to prevent hypotension.  Will avoid ibuprofen will continue with heparin every 12 for DVT prophylaxis. Aspiration, fall, speech, occupational therapy, physical therapy. EEG.  D/W husband at 1:10 AM about her MRI result showing positive for stroke.  We will get Vq scan in am as well to identify PE as pt had c/o chest tightness.

## 2023-09-08 NOTE — Progress Notes (Signed)
[facility] Progress Note  Patient: Molly [name]    VWU:[id number]  DOA: 09/07/2023     Date of Service: the patient was seen and examined on 09/08/2023  Chief Complaint  Patient presents with   Code Stroke   Brief hospital course: As per H&P LATYA HENDRICKSEN is a 82 y.o. female with medical history significant for Diabetes mellitus type 2, hypertension, hyperlipidemia, GERD, recent acute ischemic stroke status post TNK and thrombectomy for left MCA occlusion with TICI2b reperfusion.  Repeat MRI showed patchy left MCA and bilateral ACA scattered infarcts.  Repeat CT head and neck showed left MCA patent.  EF 60 to 65%.  LDL 63 and A1c 6.7.  Her symptoms resolved and neurologic intact on discharge yesterday on 10/27.  Upon discharge patient was to have cardiac monitoring for identifying underlying atrial fibrillation.  Son on the phone states that they were not happy that mom did not have the cardiac monitor on discharge were not aware that they were going to be mailed the monitor.   Son states that mom was on the recliner at home resting she got up and went outside in the yard walked to the neighbors house and walked back and after returning a few minutes later she became altered as in was awake alert oriented but she was making incomprehensible sounds and not words but gibberish.  And he called EMS because he was worried about her having another stroke.  Patient states that yesterday night she had also tingling in her left arm along with chest pain and headache which has since resolved.  In ed pt is nonfocal and has returned to baseline and has ambulated. D/W son that we will also monitor her heart and consider PE eval.    In the emergency room code stroke called as patient is high risk and status post thrombectomy.  Patient was seen by neurology and MRI / EEG was recommended.    Assessment and Plan:  # New stroke versus stroke recrudescence in the left MCA territory  Neurology consulted,  recommended, Cryptogenic etiology of the stroke-requires further workup including prolonged cardiac monitoring for underlying cardioembolic etiology  Continue dual antiplatelet therapy aspirin and Plavix for 3 months followed by aspirin only.  Continue Crestor 40 mg p.o. daily. LDL 58 below goal <70 CTA negative for large vessel occlusion EEG: cortical dysfunction in left frontotemporal region likely secondary to underlying stroke. No seizures or epileptiform discharges were seen throughout the recording.  EP cardiology consulted, plan is for implantable loop recorder insertion tomorrow a.m. PT and OT evaluation done, recommended home health PT, SLP evaluation done, recommended SLP follow-up with an outpatient Continue to monitor on telemetry Continue neurochecks every 4 hourly   # Hypertension Allow permissive hypertension Patient is on lisinopril and metoprolol, resume tomorrow a.m. Use IV hydralazine as needed   # IDDM T2 Held home regimen for now Started Semglee 20 units subcu daily and NovoLog sliding scale, monitor CBG Continue diabetic diet   Body mass index is 27.78 kg/m.  Interventions:   Diet: Diabetic diet DVT Prophylaxis: Subcutaneous Lovenox   Advance goals of care discussion: Full code  Family Communication: family was present at bedside, at the time of interview.  The pt provided permission to discuss medical plan with the family. Opportunity was given to ask question and all questions were answered satisfactorily.   Disposition:  Pt is from home, admitted with acute CVA, needs implantable loop recorder insertion which is scheduled by cardiology for tomorrow  a.m., which precludes a safe discharge. Discharge to Home with [facility], when stable and cleared by cardiology after ILR.  Subjective: No significant events overnight, patient's speech is back to normal, no new focal deficits.  No new complaints.  Physical Exam: General: NAD, lying comfortably Appear in no  distress, affect appropriate Eyes: PERRLA ENT: Oral Mucosa Clear, moist  Neck: no JVD,  Cardiovascular: S1 and S2 Present, no Murmur,  Respiratory: good respiratory effort, Bilateral Air entry equal and Decreased, no Crackles, no wheezes Abdomen: Bowel Sound present, Soft and no tenderness,  Skin: no rashes Extremities: no Pedal edema, no calf tenderness Neurologic: without any new focal findings Gait not checked due to patient safety concerns  Vitals:   09/07/23 2327 10/07/23 0323 10/07/23 0725 2023/10/07 1131  BP: (!) 171/60 (!) 146/48 (!) 159/56 (!) 150/63  Pulse: 69 61 67 73  Resp:   17 17  Temp: 98 F (36.7 C) 98 F (36.7 C) 97.6 F (36.4 C) 97.9 F (36.6 C)  TempSrc: Oral Oral Oral   SpO2: 97% 95% 97% 96%  Weight: 73.4 kg     Height: 5\' 4"  (1.626 m)       Intake/Output Summary (Last 24 hours) at 2023-10-07 1459 Last data filed at October 07, 2023 1431 Gross per 24 hour  Intake 223 ml  Output --  Net 223 ml   Filed Weights   09/07/23 1648 09/07/23 1756 09/07/23 2327  Weight: 88 kg 76.7 kg 73.4 kg    Data Reviewed: I have personally reviewed and interpreted daily labs, tele strips, imagings as discussed above. I reviewed all nursing notes, pharmacy notes, vitals, pertinent old records I have discussed plan of care as described above with RN and patient/family.  CBC: Recent Labs  Lab 09/06/23 0548 09/07/23 1635  WBC 9.7 9.3  NEUTROABS  --  5.8  HGB 13.3 14.6  HCT 39.3 44.1  MCV 91.6 94.4  PLT 267 288   Basic Metabolic Panel: Recent Labs  Lab 09/06/23 0548 09/07/23 1635  NA 140 139  K 3.8 3.7  CL 105 105  CO2 25 23  GLUCOSE 119* 162*  BUN 15 17  CREATININE 0.71 0.69  CALCIUM 9.2 9.1    Studies: EEG adult  Result Date: 07-Oct-2023 Charlsie Quest, MD     2023-10-07  2:20 PM Patient Name: MAYSA ESKILDSEN MRN: [id number] Epilepsy Attending: Charlsie Quest Referring Physician/Provider: Marvel Plan, MD Date: 10/07/23 Duration: 28.52 mins Patient  history: 82 year old female with aphasia.  EEG to alert for seizure. Level of alertness: Awake, drowsy AEDs during EEG study: None Technical aspects: This EEG study was done with scalp electrodes positioned according to the 10-20 International system of electrode placement. Electrical activity was reviewed with band pass filter of 1-70Hz , sensitivity of 7 uV/mm, display speed of 9mm/sec with a 60Hz  notched filter applied as appropriate. EEG data were recorded continuously and digitally stored.  Video monitoring was available and reviewed as appropriate. Description: The posterior dominant rhythm consists of 8 Hz activity of moderate voltage (25-35 uV) seen predominantly in posterior head regions, symmetric and reactive to eye opening and eye closing. Drowsiness was characterized by attenuation of the posterior background rhythm. Sleep was characterized by vertex waves, sleep spindles (12 to 14 Hz), maximal frontocentral region.  EEG showed intermittent 3-5 hz theta- delta slowing in left frontotemporal region.  Hyperventilation and photic stimulation were not performed.   ABNORMALITY -Intermittent slow, left frontotemporal region. IMPRESSION: This study is suggestive of cortical dysfunction in left  frontotemporal region likely secondary to underlying stroke. No seizures or epileptiform discharges were seen throughout the recording. Please note lack of epileptiform activity during interictal EEG does not exclude the diagnosis of epilepsy. Charlsie Quest   NM Pulmonary Perfusion  Result Date: 09/08/2023 CLINICAL DATA:  Dyspnea. EXAM: NUCLEAR MEDICINE PERFUSION LUNG SCAN TECHNIQUE: Perfusion images were obtained in multiple projections after intravenous injection of radiopharmaceutical. Ventilation scans intentionally deferred if perfusion scan and chest x-ray adequate for interpretation during COVID 19 epidemic. RADIOPHARMACEUTICALS:  4.36 mCi Tc-76m MAA IV COMPARISON:  Portable chest dated 09/07/2023.  FINDINGS: Normal perfusion of both lungs.  No perfusion defects. IMPRESSION: Normal examination.  No evidence of pulmonary embolism. Electronically Signed   By: Beckie Salts M.D.   On: 09/08/2023 13:50   MR BRAIN WO CONTRAST  Result Date: 09/07/2023 CLINICAL DATA:  Stroke EXAM: MRI HEAD WITHOUT CONTRAST TECHNIQUE: Multiplanar, multiecho pulse sequences of the brain and surrounding structures were obtained without intravenous contrast. COMPARISON:  09/01/2023 FINDINGS: Brain: Much of the previously seen abnormal diffusion restriction has resolved. There is persistent abnormality at the left caudate tail, left precentral gyrus and left medial frontal gyrus with a new focus in the left centrum semiovale (series 5, image 36). Petechial blood products at the superior left hemisphere (series 10, image 52). There is multifocal hyperintense T2-weighted signal within the white matter. Parenchymal volume and CSF spaces are normal. The midline structures are normal. Vascular: Normal flow voids. Skull and upper cervical spine: Normal calvarium and skull base. Visualized upper cervical spine and soft tissues are normal. Sinuses/Orbits:No paranasal sinus fluid levels or advanced mucosal thickening. No mastoid or middle ear effusion. Normal orbits. IMPRESSION: 1. Much of the previously seen abnormal diffusion restriction has resolved. Persistent diffusion restriction of the left precentral gyrus, caudate tail and medial frontal gyrus. 2. New focus of acute ischemia at the left centrum semiovale. 3. Petechial blood products at the superior left hemisphere. Electronically Signed   By: Deatra Robinson M.D.   On: 09/07/2023 21:33   DG Chest 1 View  Result Date: 09/07/2023 CLINICAL DATA:  Cough EXAM: CHEST  1 VIEW COMPARISON:  01/29/2021 FINDINGS: Upper normal cardiac size. Dense mitral annular calcification. No acute airspace disease, pleural effusion or pneumothorax. Aortic atherosclerosis. IMPRESSION: No active disease.  Electronically Signed   By: Jasmine Pang M.D.   On: 09/07/2023 20:59   CT ANGIO HEAD NECK W WO CM (CODE STROKE)  Result Date: 09/07/2023 CLINICAL DATA:  Neuro deficit, acute, stroke suspected. Right arm numbness. Transient word-finding difficulty. EXAM: CT ANGIOGRAPHY HEAD AND NECK WITH AND WITHOUT CONTRAST TECHNIQUE: Multidetector CT imaging of the head and neck was performed using the standard protocol during bolus administration of intravenous contrast. Multiplanar CT image reconstructions and MIPs were obtained to evaluate the vascular anatomy. Carotid stenosis measurements (when applicable) are obtained utilizing NASCET criteria, using the distal internal carotid diameter as the denominator. RADIATION DOSE REDUCTION: This exam was performed according to the departmental dose-optimization program which includes automated exposure control, adjustment of the mA and/or kV according to patient size and/or use of iterative reconstruction technique. CONTRAST:  75mL OMNIPAQUE IOHEXOL 350 MG/ML SOLN COMPARISON:  Head CT 09/07/2023.  CTA head/neck 09/01/2023. FINDINGS: CTA NECK FINDINGS Aortic arch: Three-vessel arch configuration. Atherosclerotic calcifications of the aortic arch and arch vessel origins. Arch vessel origins are patent. Right carotid system: No evidence of dissection, stenosis (50% or greater), or occlusion. Mild calcified plaque along the right carotid bulb. Left carotid system: No evidence of  dissection, stenosis (50% or greater), or occlusion. Mild calcified plaque along the left carotid bulb and proximal left cervical ICA. Vertebral arteries: Right-dominant. No evidence of dissection, stenosis (50% or greater), or occlusion. The left vertebral artery functionally terminates in PICA. Skeleton: Mild cervical spondylosis without high-grade spinal canal stenosis. Other neck: Unremarkable. Upper chest: Unremarkable. Review of the MIP images confirms the above findings CTA HEAD FINDINGS Anterior  circulation: Calcified plaque along the carotid siphons without hemodynamically significant stenosis. The proximal ACAs and MCAs are patent without stenosis or aneurysm. Distal branches are symmetric. Posterior circulation: Normal basilar artery. The SCAs, AICAs and PICAs are patent proximally. The PCAs are patent proximally without stenosis or aneurysm. Distal branches are symmetric. Venous sinuses: As permitted by contrast timing, patent. Anatomic variants: None. Review of the MIP images confirms the above findings IMPRESSION: 1. No large vessel occlusion or hemodynamically significant stenosis in the head or neck. 2. Mild atherosclerosis of the carotid arteries. Aortic Atherosclerosis (ICD10-I70.0). Code stroke imaging results were communicated on 09/07/2023 at 4:51 pm to provider Dr. Roda Shutters via secure text paging. Electronically Signed   By: Orvan Falconer M.D.   On: 09/07/2023 16:56   CT HEAD CODE STROKE WO CONTRAST  Result Date: 09/07/2023 CLINICAL DATA:  Code stroke.  Neuro deficit, acute, stroke suspected EXAM: CT HEAD WITHOUT CONTRAST TECHNIQUE: Contiguous axial images were obtained from the base of the skull through the vertex without intravenous contrast. RADIATION DOSE REDUCTION: This exam was performed according to the departmental dose-optimization program which includes automated exposure control, adjustment of the mA and/or kV according to patient size and/or use of iterative reconstruction technique. COMPARISON:  CT headOctober 22, 2024. FINDINGS: Brain: No evidence of acute large vascular territory infarction, hemorrhage, hydrocephalus, extra-axial collection or mass lesion/mass effect. Patchy white matter hypodensities are nonspecific but compatible with chronic microvascular ischemic disease. Vascular: No hyperdense vessel identified.  Calcific atherosclerosis Skull: Normal. Negative for fracture or focal lesion. Sinuses/Orbits: No acute finding. ASPECTS [facility] Stroke Program Early CT Score)  total score (0-10 with 10 being normal): 10 IMPRESSION: 1. No evidence of acute intracranial abnormality. 2. ASPECTS is 10. Code stroke imaging results were communicated on 09/07/2023 at 4:42 pm to provider Dr. Roda Shutters via secure text paging. Electronically Signed   By: Feliberto Harts M.D.   On: 09/07/2023 16:42    Scheduled Meds:  aspirin EC  81 mg Oral Daily   clopidogrel  75 mg Oral Daily   enoxaparin (LOVENOX) injection  40 mg Subcutaneous Q24H   insulin aspart  0-15 Units Subcutaneous TID WC   [START ON 09/09/2023] lisinopril  10 mg Oral Daily   [START ON 09/09/2023] metoprolol tartrate  25 mg Oral BID   pantoprazole  40 mg Oral BID   rosuvastatin  40 mg Oral Daily   sodium chloride flush  10 mL Intravenous Q12H   Continuous Infusions: PRN Meds: acetaminophen **OR** acetaminophen (TYLENOL) oral liquid 160 mg/5 mL **OR** acetaminophen, hydrALAZINE  Time spent: 35 minutes  Author: Gillis Santa. MD [facility] 09/08/2023 2:59 PM  To reach On-call, see care teams to locate the attending and reach out to them via [email or web address]. If 7PM-7AM, please contact night-coverage If you still have difficulty reaching the attending provider, please page the [facility] (Director on Call) for [facility] on amion for assistance.

## 2023-09-08 NOTE — Plan of Care (Signed)
  Problem: Education: Goal: Knowledge of disease or condition will improve 09/08/2023 1451 by Danella Sensing, RN Outcome: Progressing 09/08/2023 1143 by Danella Sensing, RN Outcome: Progressing Goal: Knowledge of secondary prevention will improve (MUST DOCUMENT ALL) 09/08/2023 1451 by Danella Sensing, RN Outcome: Progressing 09/08/2023 1143 by Danella Sensing, RN Outcome: Progressing Goal: Knowledge of patient specific risk factors will improve Loraine Leriche N/A or DELETE if not current risk factor) 09/08/2023 1451 by Danella Sensing, RN Outcome: Progressing 09/08/2023 1143 by Danella Sensing, RN Outcome: Progressing   Problem: Coping: Goal: Will verbalize positive feelings about self 09/08/2023 1451 by Danella Sensing, RN Outcome: Progressing 09/08/2023 1143 by Danella Sensing, RN Outcome: Progressing Goal: Will identify appropriate support needs 09/08/2023 1451 by Danella Sensing, RN Outcome: Progressing 09/08/2023 1143 by Danella Sensing, RN Outcome: Progressing   Problem: Ischemic Stroke/TIA Tissue Perfusion: Goal: Complications of ischemic stroke/TIA will be minimized 09/08/2023 1451 by Danella Sensing, RN Outcome: Progressing 09/08/2023 1143 by Danella Sensing, RN Outcome: Progressing   Problem: Health Behavior/Discharge Planning: Goal: Ability to manage health-related needs will improve 09/08/2023 1451 by Danella Sensing, RN Outcome: Progressing 09/08/2023 1143 by Danella Sensing, RN Outcome: Progressing Goal: Goals will be collaboratively established with patient/family 09/08/2023 1451 by Danella Sensing, RN Outcome: Progressing 09/08/2023 1143 by Danella Sensing, RN Outcome: Progressing   Problem: Self-Care: Goal: Ability to participate in self-care as condition permits will improve 09/08/2023 1451 by Danella Sensing, RN Outcome: Progressing 09/08/2023 1143 by Danella Sensing, RN Outcome:  Progressing Goal: Verbalization of feelings and concerns over difficulty with self-care will improve 09/08/2023 1451 by Danella Sensing, RN Outcome: Progressing 09/08/2023 1143 by Danella Sensing, RN Outcome: Progressing Goal: Ability to communicate needs accurately will improve 09/08/2023 1451 by Danella Sensing, RN Outcome: Progressing 09/08/2023 1143 by Danella Sensing, RN Outcome: Progressing   Problem: Nutrition: Goal: Risk of aspiration will decrease 09/08/2023 1451 by Danella Sensing, RN Outcome: Progressing 09/08/2023 1143 by Danella Sensing, RN Outcome: Progressing Goal: Dietary intake will improve 09/08/2023 1451 by Danella Sensing, RN Outcome: Progressing 09/08/2023 1143 by Danella Sensing, RN Outcome: Progressing   Problem: Elimination: Goal: Will not experience complications related to bowel motility 09/08/2023 1451 by Danella Sensing, RN Outcome: Progressing 09/08/2023 1143 by Danella Sensing, RN Outcome: Progressing Goal: Will not experience complications related to urinary retention 09/08/2023 1451 by Danella Sensing, RN Outcome: Progressing 09/08/2023 1143 by Danella Sensing, RN Outcome: Progressing   Problem: Nutrition: Goal: Adequate nutrition will be maintained 09/08/2023 1451 by Danella Sensing, RN Outcome: Progressing 09/08/2023 1143 by Danella Sensing, RN Outcome: Progressing

## 2023-09-08 NOTE — Assessment & Plan Note (Signed)
Will continue patient on levothyroxine and check patient's free T4 and TSH levels.

## 2023-09-08 NOTE — Progress Notes (Signed)
   09/08/23 1400  Spiritual Encounters  Type of Visit Follow up;Attempt (pt unavailable)  Care provided to: Family   Chaplain went to try to do a follow up visit with the patient twice. She was getting a scan and then when the chaplain came back she was in the bathroom. Chaplain spoke with patients husband who was happy to see the chaplain and talked to her about how he is feeling and dealing with the patient having another stroke. Chaplain offered him words of encouragement. Chaplain will try to follow up at a later time.

## 2023-09-08 NOTE — Progress Notes (Signed)
NEUROLOGY CONSULT FOLLOW UP NOTE   Date of service: September 08, 2023 Patient Name: Molly [name] MRN:  [id number] DOB:  1941-05-27  Brief HPI  Molly [name] is a 82 y.o. female  has a past medical history of Arrhythmia, Arthritis, Basal cell carcinoma, Cancer (HCC), CMV (cytomegalovirus infection) (HCC), Colon polyps, Diabetes mellitus, type 2 (HCC), Duodenitis, Dysrhythmia, Fatty liver disease, nonalcoholic, FH: colonic polyps, GERD (gastroesophageal reflux disease), Heart murmur, Hemorrhoid (.), Hyperlipidemia, Hypertension, IBS (irritable bowel syndrome), Osteoporosis, and Pancreatitis.  Recent left MCA stroke that was treated with TNK as well as left MCA endovascular thrombectomy achieving a TICI2b reperfusion with prior MRI showing patchy left MCA and bilateral ACA scattered infarcts, no coming with last known well 2 PM on 09/07/2023, with difficulty of speech/aphasia-not able to form sentences.  Seen as a telestroke-not a candidate for TNK due to recent stroke.  CT angiography showed patent left MCA. Has been recommended CardioNet monitor as outpatient   Interval Hx/subjective  No acute events overnight.  Speech seems to be better.  Husband at bedside confirming.  Vitals   Vitals:   09/07/23 2230 09/07/23 2327 09/08/23 0323 09/08/23 0725  BP: (!) 171/61 (!) 171/60 (!) 146/48 (!) 159/56  Pulse: 70 69 61 67  Resp: 19   17  Temp:  98 F (36.7 C) 98 F (36.7 C) 97.6 F (36.4 C)  TempSrc:  Oral Oral Oral  SpO2: 96% 97% 95% 97%  Weight:  73.4 kg    Height:  5\' 4"  (1.626 m)       Body mass index is 27.78 kg/m.  Physical Exam  General: Awake alert in no distress HEENT: Normocephalic atraumatic Lungs: Clear Cardiovascular: Regular rhythm Neurological exam She is awake alert oriented x 3 There is mild loss of fluency, some hesitancy but other than that is able to follow all commands.  Is able to name simple objects.  Able to repeat. Cranial nerves: Pupils are equal round react light,  extraocular movements intact, visual fields full, very subtle right facial droop, tongue and palate midline. Motor examination: No drift in any of the extremities Sensation intact to light touch bilaterally. No dysmetria No neglect  Labs and Diagnostic Imaging   CBC:  Recent Labs  Lab 09/06/23 0548 09/07/23 1635  WBC 9.7 9.3  NEUTROABS  --  5.8  HGB 13.3 14.6  HCT 39.3 44.1  MCV 91.6 94.4  PLT 267 288    Basic Metabolic Panel:  Lab Results  Component Value Date   NA 139 09/07/2023   K 3.7 09/07/2023   CO2 23 09/07/2023   GLUCOSE 162 (H) 09/07/2023   BUN 17 09/07/2023   CREATININE 0.69 09/07/2023   CALCIUM 9.1 09/07/2023   GFRNONAA >60 09/07/2023   GFRAA >60 08/11/2019   Lipid Panel:  Lab Results  Component Value Date   LDLCALC 58 09/07/2023   HgbA1c:  Lab Results  Component Value Date   HGBA1C 6.7 (H) 08/31/2023   Urine Drug Screen:     Component Value Date/Time   LABOPIA NONE DETECTED 09/07/2023 2118   COCAINSCRNUR NONE DETECTED 09/07/2023 2118   LABBENZ NONE DETECTED 09/07/2023 2118   AMPHETMU NONE DETECTED 09/07/2023 2118   THCU NONE DETECTED 09/07/2023 2118   LABBARB NONE DETECTED 09/07/2023 2118    Alcohol Level     Component Value Date/Time   ETH <10 09/07/2023 1635   INR  Lab Results  Component Value Date   INR 1.0 09/07/2023   APTT  Lab  Results  Component Value Date   APTT 30 09/07/2023    CT Head without contrast(Personally reviewed): No acute changes  CT angio Head and Neck with contrast(Personally reviewed): No ELVO.  Patent left MCA.  MRI Brain(Personally reviewed): Question of a small new area of restricted diffusion in the left corona radiata-I think this was present before and possibly reveals a much prominent area of restricted diffusion than before due to technique difference/slicing.  There is a small possibility that this truly could be a new stroke as well.  rEEG:  pending  Assessment   Molly [name] is a 82 y.o.  female past medical history as above including that of her recent left MCA stroke status post TNK and endovascular revascularization who comes in for evaluation of another episode of aphasia.  Repeat MRI with a questionable new area of stroke. CT angiography head and neck with patent vasculature including the previously reperfused left MCA. Getting an EEG to rule out electrographic abnormality. Most likely recrudescence of old stroke symptoms but evaluation for seizure is reasonable. Was supposed to get a 30-day cardiac monitor as an outpatient after the discharge but has not received it yet Given that the etiology of stroke remains cryptogenic, some concern for prior cardiac arrhythmia listed in medical list without a clear answer, I would recommend an implantable loop recorder instead of a 30-day monitor with strong suspicion for underlying atrial fibrillation as etiology for strokes.  Impression New stroke versus stroke recrudescence in the left MCA territory Cryptogenic etiology of the stroke-requires further workup including prolonged cardiac monitoring for underlying cardioembolic etiology Evaluate for underlying cerebral electrographic disturbance such as a seizure due to underlying stroke.     Recommendations  Continue dual antiplatelets as prescribed at discharge on 09/06/2023-aspirin and Plavix for 3 months followed by aspirin only. Follow-up on the EEG-consider AED if it shows any abnormalities.  Otherwise I would hold off on AEDs for now. Requested a cardiac EP consultation for ILR placement.  Appreciate their assistance.  May need to stay overnight for an ILR placement tomorrow versus outpatient-cardiac EP to decide with patient. Updated Dr. Lucianne Muss on the plan. Discussed the plan with the husband over the phone again this afternoon after discussing the initial plan in the morning on rounds.   -- Milon Dikes, MD Neurologist [facility]

## 2023-09-08 NOTE — Progress Notes (Signed)
Eeg done 

## 2023-09-08 NOTE — Evaluation (Signed)
Physical Therapy Evaluation Patient Details Name: Molly [name] MRN: [id number] DOB: 10-07-1941 Today's Date: 09/08/2023  History of Present Illness  Patient is a 82 year old female recently admitted to [facility] with right side weakness and dysarthria s/p TNK and thrombectomy. Was discharge home and developed aphasia, right side tingling. MRI of brain with new focus of acute ischemia at the left centrum semiovale    Clinical Impression  Patient was agreeable to PT evaluation and eager to mobilize. She reports being discharged for only one day before having to return but was using rolling walker for ambulation since recent stroke. She lives with supportive spouse.  No focal weakness noted in LE on exam today. The patient reports sensation is normal. Supervision provided for hallway ambulation using rolling walker. Patient did require cueing and occasionally bumped into objects on the right side. She does have difficulty with multi step commands and decreased safety awareness but is able to follow single step commands consistently. Recommend to continue PT to maximize independence and decrease caregiver burden.     If plan is discharge home, recommend the following: Supervision due to cognitive status;Help with stairs or ramp for entrance;Assist for transportation   Can travel by private vehicle        Equipment Recommendations None recommended by PT  Recommendations for Other Services       Functional Status Assessment Patient has had a recent decline in their functional status and demonstrates the ability to make significant improvements in function in a reasonable and predictable amount of time.     Precautions / Restrictions Precautions Precautions: Fall Restrictions Weight Bearing Restrictions: No      Mobility  Bed Mobility Overal bed mobility: Modified Independent                  Transfers Overall transfer level: Needs assistance Equipment used: Rolling walker (2  wheels) Transfers: Sit to/from Stand Sit to Stand: Supervision                Ambulation/Gait Ambulation/Gait assistance: Supervision Gait Distance (Feet): 175 Feet Assistive device: Rolling walker (2 wheels) Gait Pattern/deviations: Step-through pattern Gait velocity: decreased     General Gait Details: intermittently bumping into objects on the right. head turns without loss of balance. slow but steady with ambualtion. navigational cues required  Stairs            Wheelchair Mobility     Tilt Bed    Modified Rankin (Stroke Patients Only)       Balance Overall balance assessment: Needs assistance Sitting-balance support: Feet supported, Bilateral upper extremity supported Sitting balance-Leahy Scale: Good     Standing balance support: No upper extremity supported, During functional activity Standing balance-Leahy Scale: Poor Standing balance comment: external support required. recommended continued use of rolling walker with dynamic activity and ambualtion                             Pertinent Vitals/Pain Pain Assessment Pain Assessment: No/denies pain    Home Living Family/patient expects to be discharged to:: Private residence Living Arrangements: Spouse/significant other Available Help at Discharge: Family;Available 24 hours/day Type of Home: House Home Access: Stairs to enter Entrance Stairs-Rails: Doctor, general practice of Steps: 3   Home Layout: One level Home Equipment: Shower seat;Grab bars - toilet;Grab bars - tub/shower;Hand held Programmer, systems (2 wheels);Cane - single point      Prior Function Prior Level of Function :  Independent/Modified Independent;Driving             Mobility Comments: has been using rolling walker since stroke ADLs Comments: driving when she wanted to, spouse food shops,     Extremity/Trunk Assessment   Upper Extremity Assessment Upper Extremity Assessment: Overall WFL  for tasks assessed    Lower Extremity Assessment Lower Extremity Assessment: Overall WFL for tasks assessed       Communication   Communication Communication: Difficulty following commands/understanding Following commands: Follows one step commands with increased time;Follows multi-step commands inconsistently Cueing Techniques: Verbal cues  Cognition Arousal: Alert Behavior During Therapy: WFL for tasks assessed/performed Overall Cognitive Status: History of cognitive impairments - at baseline Area of Impairment: Memory, Following commands, Safety/judgement, Problem solving, Attention                   Current Attention Level: Sustained Memory: Decreased short-term memory Following Commands: Follows one step commands with increased time, Follows multi-step commands inconsistently Safety/Judgement: Decreased awareness of safety, Decreased awareness of deficits Awareness: Emergent Problem Solving: Slow processing, Decreased initiation, Requires verbal cues, Difficulty sequencing, Requires tactile cues          General Comments General comments (skin integrity, edema, etc.): spouse present and very supportive    Exercises     Assessment/Plan    PT Assessment Patient needs continued PT services  PT Problem List Decreased activity tolerance;Decreased balance;Decreased mobility;Decreased safety awareness       PT Treatment Interventions DME instruction;Gait training;Stair training;Functional mobility training;Therapeutic activities;Therapeutic exercise;Balance training;Neuromuscular re-education;Cognitive remediation;Patient/family education    PT Goals (Current goals can be found in the Care Plan section)  Acute Rehab PT Goals Patient Stated Goal: to return home PT Goal Formulation: With patient/family Time For Goal Achievement: 09/22/23 Potential to Achieve Goals: Good Additional Goals Additional Goal #1: patient will be independent when challenged outside base  of support without UE support to decrease risk for falls    Frequency Min 1X/week     Co-evaluation               AM-PAC PT "6 Clicks" Mobility  Outcome Measure Help needed turning from your back to your side while in a flat bed without using bedrails?: None Help needed moving from lying on your back to sitting on the side of a flat bed without using bedrails?: A Little Help needed moving to and from a bed to a chair (including a wheelchair)?: A Little Help needed standing up from a chair using your arms (e.g., wheelchair or bedside chair)?: A Little Help needed to walk in hospital room?: A Little Help needed climbing 3-5 steps with a railing? : A Little 6 Click Score: 19    End of Session   Activity Tolerance: Patient tolerated treatment well Patient left: in bed;with call bell/phone within reach;with bed alarm set;with family/visitor present Nurse Communication: Mobility status PT Visit Diagnosis: Unsteadiness on feet (R26.81);Muscle weakness (generalized) (M62.81)    Time: 2952-8413 PT Time Calculation (min) (ACUTE ONLY): 30 min   Charges:   PT Evaluation $PT Eval Low Complexity: 1 Low PT Treatments $Therapeutic Activity: 8-22 mins PT General Charges $$ ACUTE PT VISIT: 1 Visit         Donna Bernard, PT, MPT   Ina Homes 09/08/2023, 3:59 PM

## 2023-09-08 NOTE — Assessment & Plan Note (Signed)
Vitals:   09/07/23 1651 09/07/23 1653 09/07/23 1927 09/07/23 1930  BP: (!) 183/61 (!) 190/68 (!) 162/57 (!) 162/52   09/07/23 2000 09/07/23 2030 09/07/23 2100 09/07/23 2130  BP: (!) 160/51 (!) 161/60 (!) 176/56 (!) 171/60   09/07/23 2200 09/07/23 2230 09/07/23 2327  BP: (!) 163/55 (!) 171/61 (!) 171/60  Patient started on metoprolol 25 mg twice a day and lisinopril 10 mg daily.  As needed hydralazine for hypertension with goal blood pressure of 140s and 150s systolic.

## 2023-09-08 NOTE — Consult Note (Signed)
ELECTROPHYSIOLOGY CONSULT NOTE  Patient ID: Molly [name] MRN: [id number], DOB/AGE: [birth year]   Admit date: 09/07/2023 Date of Consult: 09/08/2023  Primary Physician: Marguarite Arbour, MD Primary Cardiologist: None  Primary Electrophysiologist: New to None  Reason for Consultation: Cryptogenic stroke; recommendations regarding Implantable Loop Recorder Insurance: [employer] P[employer]  History of Present Illness EP has been asked to evaluate Molly [name] for placement of an implantable loop recorder to monitor for atrial fibrillation by Dr  Wilford Corner .  She has PMH of T2DM, NAFLD, HTRN, HLD.  The patient was admitted on 09/07/2023 with with aphasia, concerning for recurrent stroke.  She had a recent left MCA stroke that was treated with TNK as well as left MCA endovascular thrombectomy, [facility] 10/27 with a 30d heart monitor that was to be mailed to patient's home.    She presented to [facility] ER 10/28 with aphasia and intermittent R arm numbness. She was not a candidate for TNK due to recent stroke. CT angiography showed patent left MCA.  MRI brain was concerning for small, new area of restricted diffusion in the left corona radiata, raising the possibility that this is a new stroke.   Workup thus far:  - 2D echo with LVEF 60-65%, bubble study negative, L atrial size normal - LDL 63 - HgbA1c 6.7.   - EEG - pending results   The patient has been monitored on telemetry which has demonstrated sinus rhythm with rare PVCs.  Inpatient stroke work-up will not require a TEE per Neurology.   Echocardiogram as above. Lab work is reviewed.  Prior to admission, the patient denies chest pain, shortness of breath, dizziness, palpitations, or syncope.  She is recovering from her stroke with plans to return home  at discharge.  Allergies, Past Medical, Surgical, Social, and Family Histories have been reviewed and are referenced here-in when relevant for medical decision making.    Inpatient Medications:   aspirin EC  81 mg Oral Daily   clopidogrel  75 mg Oral Daily   enoxaparin (LOVENOX) injection  40 mg Subcutaneous Q24H   insulin aspart  0-15 Units Subcutaneous TID WC   [START ON 09/09/2023] lisinopril  10 mg Oral Daily   [START ON 09/09/2023] metoprolol tartrate  25 mg Oral BID   pantoprazole  40 mg Oral BID   rosuvastatin  40 mg Oral Daily   sodium chloride flush  10 mL Intravenous Q12H    Physical Exam: Vitals:   09/07/23 2327 09/08/23 0323 09/08/23 0725 09/08/23 1131  BP: (!) 171/60 (!) 146/48 (!) 159/56 (!) 150/63  Pulse: 69 61 67 73  Resp:   17 17  Temp: 98 F (36.7 C) 98 F (36.7 C) 97.6 F (36.4 C) 97.9 F (36.6 C)  TempSrc: Oral Oral Oral   SpO2: 97% 95% 97% 96%  Weight: 73.4 kg     Height: 5\' 4"  (1.626 m)       GEN- NAD. A&O x 3. Normal affect. HEENT: Normocephalic, atraumatic Lungs- CTAB, Normal effort.  Heart- Regular rate and rhythm rate and rhythm. No M/G/R.  Extremities- No peripheral edema. no clubbing or cyanosis Skin- warm and dry, no rash or lesion. Neuro - slight slurred speech, slight R facial droop   12-lead ECG  (personally reviewed) All prior EKG's in EPIC reviewed with no documented atrial fibrillation  Telemetry NSR at 60-70s (personally reviewed)  Assessment and Plan:  1. Cryptogenic stroke The patient presents with cryptogenic stroke.  The patient does  not have a TEE planned for this AM.  I spoke at length with the patient, husband in person and patient's son via telephone about monitoring for afib with an implantable loop recorder.  Risks, benefits, and alteratives to implantable loop recorder were discussed with the patient today.   At this time, the patient is very clear in their decision to proceed with implantable loop recorder.   Wound care was reviewed with the patient (keep incision clean and dry for 3 days). Please call with questions.   ILR to be implanted 10/30 in [facility] Cath lab by Dr.  Lalla Brothers.   Sherie Don, NP 09/08/2023 2:57 PM

## 2023-09-08 NOTE — Progress Notes (Signed)
PT Cancellation Note  Patient Details Name: Molly [name] MRN: [id number] DOB: [birth year]   Cancelled Treatment:    Reason Eval/Treat Not Completed: Other (comment) (PT in the room starting evaluation when transporter arrived to take patient to procedure. Nurse reports patient has been ambulating independently to the bathroom. PT will follow as patient available.)  Donna Bernard, PT, MPT  Ina Homes 09/08/2023, 12:52 PM

## 2023-09-08 NOTE — Plan of Care (Signed)
  Problem: Education: Goal: Knowledge of disease or condition will improve Outcome: Progressing Goal: Knowledge of secondary prevention will improve (MUST DOCUMENT ALL) Outcome: Progressing Goal: Knowledge of patient specific risk factors will improve Loraine Leriche N/A or DELETE if not current risk factor) Outcome: Progressing   Problem: Coping: Goal: Will verbalize positive feelings about self Outcome: Progressing Goal: Will identify appropriate support needs Outcome: Progressing   Problem: Health Behavior/Discharge Planning: Goal: Ability to manage health-related needs will improve Outcome: Progressing Goal: Goals will be collaboratively established with patient/family Outcome: Progressing   Problem: Self-Care: Goal: Ability to participate in self-care as condition permits will improve Outcome: Progressing Goal: Verbalization of feelings and concerns over difficulty with self-care will improve Outcome: Progressing Goal: Ability to communicate needs accurately will improve Outcome: Progressing   Problem: Nutrition: Goal: Risk of aspiration will decrease Outcome: Progressing Goal: Dietary intake will improve Outcome: Progressing   Problem: Clinical Measurements: Goal: Ability to maintain clinical measurements within normal limits will improve Outcome: Progressing Goal: Will remain free from infection Outcome: Progressing Goal: Diagnostic test results will improve Outcome: Progressing Goal: Respiratory complications will improve Outcome: Progressing Goal: Cardiovascular complication will be avoided Outcome: Progressing   Problem: Elimination: Goal: Will not experience complications related to bowel motility Outcome: Progressing Goal: Will not experience complications related to urinary retention Outcome: Progressing   Problem: Pain Management: Goal: General experience of comfort will improve Outcome: Progressing   Problem: Skin Integrity: Goal: Risk for impaired skin  integrity will decrease Outcome: Progressing

## 2023-09-08 NOTE — Evaluation (Signed)
Occupational Therapy Evaluation Patient Details Name: Molly [name] MRN: [id number] DOB: 08/06/1941 Today's Date: 09/08/2023   History of Present Illness Patient is a 82 year old female recently admitted to [facility] with right side weakness and dysarthria s/p TNK and thrombectomy. Was discharge home and developed aphasia, right side tingling. MRI of brain with new focus of acute ischemia at the left centrum semiovale   Clinical Impression   Pt was seen for OT evaluation this date. Prior to hospital admission, prior to previous admission, aprox 1 week ago pt was independent. Pt lives her husband in one level home, 3 steps to enter. Pt alert and oriented x3, noted confusion about current health status. Pt visual assessment present with some difficulty with R eye, tracking in L inferior field. Pt presents to acute OT demonstrating impaired ADL performance and independence  (See OT problem list for additional functional deficits). Pt currently requires CGA+RW for all OOB mobility. Pt completed simulated toilet t/f with CGA + hand rails, no difficulty with STS from regular toilet. Pt had difficulty with multi step tasks, and sustaining attention on said tasks. Pt often required multiple reminders on directions and steps through. Pt would benefit from skilled OT services to address noted impairments and functional limitations (see below for any additional details) in order to maximize safety and independence while minimizing falls risk and caregiver burden. OT will follow acutely.      If plan is discharge home, recommend the following: A lot of help with bathing/dressing/bathroom;A little help with walking and/or transfers;Assist for transportation;Supervision due to cognitive status;Assistance with cooking/housework;Direct supervision/assist for financial management;Direct supervision/assist for medications management    Functional Status Assessment  Patient has had a recent decline in their functional  status and demonstrates the ability to make significant improvements in function in a reasonable and predictable amount of time.  Equipment Recommendations  Other (comment) (next venue)    Recommendations for Other Services       Precautions / Restrictions Precautions Precautions: Fall Restrictions Weight Bearing Restrictions: No      Mobility Bed Mobility Overal bed mobility: Needs Assistance Bed Mobility: Sit to Supine       Sit to supine: Supervision   General bed mobility comments: Pt sitting on EOB on arrival to room    Transfers Overall transfer level: Needs assistance Equipment used: Rolling walker (2 wheels) Transfers: Sit to/from Stand Sit to Stand: Contact guard assist     Step pivot transfers: Contact guard assist     General transfer comment: Pt amb to bathroom and back to bed ~13ft      Balance Overall balance assessment: Needs assistance Sitting-balance support: Feet supported, Bilateral upper extremity supported Sitting balance-Leahy Scale: Good Sitting balance - Comments: fair dynamic sitting balance during LB dressing   Standing balance support: No upper extremity supported, During functional activity Standing balance-Leahy Scale: Fair                             ADL either performed or assessed with clinical judgement   ADL Overall ADL's : Needs assistance/impaired     Grooming: Wash/dry hands;Wash/dry face;Cueing for sequencing;Standing Grooming Details (indicate cue type and reason): sink level, cues for attention on task at hand             Lower Body Dressing: Supervision/safety;Bed level Lower Body Dressing Details (indicate cue type and reason): Donn/doff socks Toilet Transfer: Contact guard assist;Ambulation;Grab Paramedic Details (  indicate cue type and reason): Simulated   Toileting - Clothing Manipulation Details (indicate cue type and reason): NT     Functional mobility during ADLs:  Contact guard assist;Rolling walker (2 wheels) General ADL Comments: Constant verbal cues for sequencing and task attention     Vision Baseline Vision/History: 1 Wears glasses Vision Assessment?: Yes Ocular Range of Motion: Restricted on the left Alignment/Gaze Preference: Within Defined Limits Tracking/Visual Pursuits: Decreased smoothness of eye movement to LEFT inferior field;Impaired - to be further tested in functional context Saccades: Within functional limits Convergence: Within functional limits Visual Fields: No apparent deficits     Perception Perception: Within Functional Limits       Praxis Praxis: WFL       Pertinent Vitals/Pain Pain Assessment Pain Assessment: Faces Faces Pain Scale: Hurts a little bit Pain Location: Bilateral IV lines when she touches them Pain Descriptors / Indicators: Sharp Pain Intervention(s): Monitored during session     Extremity/Trunk Assessment Upper Extremity Assessment Upper Extremity Assessment: Overall WFL for tasks assessed   Lower Extremity Assessment Lower Extremity Assessment: Overall WFL for tasks assessed       Communication Communication Communication: Difficulty following commands/understanding Following commands: Follows one step commands with increased time;Follows multi-step commands inconsistently Cueing Techniques: Verbal cues   Cognition Arousal: Alert Behavior During Therapy: WFL for tasks assessed/performed Overall Cognitive Status: History of cognitive impairments - at baseline Area of Impairment: Memory, Following commands, Safety/judgement, Problem solving, Attention, Orientation                 Orientation Level: Disoriented to, Situation Current Attention Level: Sustained Memory: Decreased short-term memory Following Commands: Follows one step commands with increased time, Follows multi-step commands inconsistently Safety/Judgement: Decreased awareness of safety, Decreased awareness of  deficits   Problem Solving: Slow processing, Decreased initiation, Requires verbal cues, Difficulty sequencing, Requires tactile cues General Comments: Pt was alert and oriented x3, cooperative during session     General Comments  spouse present and very supportive    Exercises Other Exercises Other Exercises: Edu: role of OT, safe ADL completion   Shoulder Instructions      Home Living Family/patient expects to be discharged to:: Private residence Living Arrangements: Spouse/significant other Available Help at Discharge: Family;Available 24 hours/day Type of Home: House Home Access: Stairs to enter [employer] of Steps: 3 Entrance Stairs-Rails: Right;Left Home Layout: One level     Bathroom Shower/Tub: Producer, television/film/video: Standard Bathroom Accessibility: Yes   Home Equipment: Shower seat;Grab bars - toilet;Grab bars - tub/shower;Hand held Programmer, systems (2 wheels);Gilmer Mor - single point      Lives With: Spouse    Prior Functioning/Environment Prior Level of Function : Independent/Modified Independent;Driving             Mobility Comments: has been using rolling walker since stroke ADLs Comments: driving when she wanted to, spouse food shops,        OT Problem List: Decreased strength;Decreased range of motion;Decreased activity tolerance;Impaired balance (sitting and/or standing);Impaired vision/perception;Decreased cognition;Decreased safety awareness;Decreased knowledge of use of DME or AE;Decreased knowledge of precautions;Impaired sensation;Obesity      OT Treatment/Interventions: Self-care/ADL training;Therapeutic exercise;Neuromuscular education;Energy conservation;Manual therapy;DME and/or AE instruction;Modalities;Therapeutic activities;Cognitive remediation/compensation;Patient/family education;Balance training    OT Goals(Current goals can be found in the care plan section) Acute Rehab OT Goals Patient Stated Goal: To  go home OT Goal Formulation: With patient Time For Goal Achievement: 09/22/23 Potential to Achieve Goals: Good ADL Goals Pt Will Perform Grooming: standing;with modified  independence Pt Will Perform Lower Body Dressing: with modified independence;sit to/from stand Pt Will Transfer to Toilet: with modified independence;ambulating;regular height toilet Pt Will Perform Toileting - Clothing Manipulation and hygiene: with modified independence;sit to/from stand  OT Frequency: Min 1X/week    Co-evaluation              AM-PAC OT "6 Clicks" Daily Activity     Outcome Measure Help from another person eating meals?: A Little Help from another person taking care of personal grooming?: A Little Help from another person toileting, which includes using toliet, bedpan, or urinal?: A Little Help from another person bathing (including washing, rinsing, drying)?: A Lot Help from another person to put on and taking off regular upper body clothing?: None Help from another person to put on and taking off regular lower body clothing?: None 6 Click Score: 19   End of Session Equipment Utilized During Treatment: Rolling walker (2 wheels) Nurse Communication: Mobility status  Activity Tolerance: Patient tolerated treatment well Patient left: in bed;with family/visitor present;with call bell/phone within reach;with bed alarm set  OT Visit Diagnosis: Unsteadiness on feet (R26.81);Muscle weakness (generalized) (M62.81);Other abnormalities of gait and mobility (R26.89)                Time: 4098-1191 OT Time Calculation (min): 22 min Charges:  OT General Charges $OT Visit: 1 Visit OT Evaluation $OT Eval Moderate Complexity: 1 Mod  [employer], OTS

## 2023-09-08 NOTE — Procedures (Signed)
Patient Name: PAIGELYN SHILLINGLAW  MRN: [id number]  Epilepsy Attending: Charlsie Quest  Referring Physician/Provider: Marvel Plan, MD  Date: 09/08/2023 Duration: 28.52 mins  Patient history: 82 year old female with aphasia.  EEG to alert for seizure.  Level of alertness: Awake, drowsy  AEDs during EEG study: None  Technical aspects: This EEG study was done with scalp electrodes positioned according to the 10-20 International system of electrode placement. Electrical activity was reviewed with band pass filter of 1-70Hz , sensitivity of 7 uV/mm, display speed of 47mm/sec with a 60Hz  notched filter applied as appropriate. EEG data were recorded continuously and digitally stored.  Video monitoring was available and reviewed as appropriate.  Description: The posterior dominant rhythm consists of 8 Hz activity of moderate voltage (25-35 uV) seen predominantly in posterior head regions, symmetric and reactive to eye opening and eye closing. Drowsiness was characterized by attenuation of the posterior background rhythm. Sleep was characterized by vertex waves, sleep spindles (12 to 14 Hz), maximal frontocentral region.  EEG showed intermittent 3-5 hz theta- delta slowing in left frontotemporal region.  Hyperventilation and photic stimulation were not performed.     ABNORMALITY -Intermittent slow, left frontotemporal region.  IMPRESSION: This study is suggestive of cortical dysfunction in left frontotemporal region likely secondary to underlying stroke. No seizures or epileptiform discharges were seen throughout the recording.  Please note lack of epileptiform activity during interictal EEG does not exclude the diagnosis of epilepsy.  Shannell Mikkelsen Annabelle Harman

## 2023-09-08 NOTE — Evaluation (Signed)
Speech Language Pathology Evaluation Patient Details Name: Molly [name] MRN: [id number] DOB: 07/06/1941 Today's Date: 09/08/2023 Time: 0905-1005 SLP Time Calculation (min) (ACUTE ONLY): 60 min  Problem List:  Patient Active Problem List   Diagnosis Date Noted   Aphasia 09/07/2023   Stroke (HCC) 08/31/2023   Acute ischemic stroke (HCC) 08/31/2023   Compression fracture of T12 vertebra (HCC) 02/12/2023   Abnormal MRI, lumbar spine (10/17/2022) 02/10/2023   Colon polyp 02/10/2023   Chronic pain syndrome 02/10/2023   Pharmacologic therapy 02/10/2023   Disorder of skeletal system 02/10/2023   Problems influencing health status 02/10/2023   Chronic low back pain (1ry area of Pain) (Bilateral) w/o sciatica 02/10/2023   Compression fracture of L1 lumbar vertebra, sequela 02/10/2023   History of kyphoplasty (L1) 02/10/2023   Radicular pain of thoracic region 02/10/2023   Compression fracture of L2 lumbar vertebra, sequela 02/10/2023   Grade 1 Retrolisthesis of L2/L3 and L3/L4 02/10/2023   Lumbar facet arthropathy (Multilevel) (Bilateral) 02/10/2023   Synovial cyst of lumbar facet joint (L5-S1) (Left) 02/10/2023   DDD (degenerative disc disease), lumbosacral 02/10/2023   Age-related osteoporosis with current pathological fracture, vertebra(e), sequela 02/10/2023   DDD (degenerative disc disease), thoracic 02/10/2023   Chronic recurrent pancreatitis (HCC) 05/07/2021   Acute respiratory failure with hypoxia (HCC) 01/29/2021   Constipation 01/26/2021   Pancreatitis, recurrent 01/25/2021   Essential hypertension    Fatty liver disease, nonalcoholic 05/18/2019   Post-menopausal osteoporosis 05/16/2019   Hx of adenomatous colonic polyps 05/16/2019   H/O acute pancreatitis 04/01/2018   Drug-induced acute pancreatitis without infection or necrosis 03/26/2018   Acute pancreatitis 03/18/2018   Abnormal TSH 06/29/2015   DM (diabetes mellitus) (HCC) 06/06/2014   Hyperlipidemia 06/06/2014    Osteopenia 06/06/2014   Chronic thoracic back pain (2ry area of Pain) (Bilateral) 10/01/2010   SINUSITIS - ACUTE-NOS 08/30/2009   Disturbance in sleep behavior 08/30/2009   GOUT 04/03/2009   GERD (gastroesophageal reflux disease) 09/08/2008   Palpitations 12/14/2007   DIABETES MELLITUS, TYPE II 07/20/2007   HYPERLIPIDEMIA 07/20/2007   IBS 07/20/2007   Past Medical History:  Past Medical History:  Diagnosis Date   Arrhythmia    Arthritis    Basal cell carcinoma    right neck, right forehead - treated in the past   Cancer (HCC)    skin   CMV (cytomegalovirus infection) (HCC)    Colon polyps    Diabetes mellitus, type 2 (HCC)    Duodenitis    Dysrhythmia    Fatty liver disease, nonalcoholic    FH: colonic polyps    hx of-adenomatous   GERD (gastroesophageal reflux disease)    Heart murmur    dx'd in 1960s. followed by PCP   Hemorrhoid .   Hyperlipidemia    Hypertension    IBS (irritable bowel syndrome)    Osteoporosis    Pancreatitis    drug induced   Past Surgical History:  Past Surgical History:  Procedure Laterality Date   ABDOMINAL HYSTERECTOMY     Carotids  3/08   mild only   CATARACT EXTRACTION W/PHACO Right 06/29/2017   Procedure: CATARACT EXTRACTION PHACO AND INTRAOCULAR LENS PLACEMENT (IOC)  Toric Diabetic Right;  Surgeon: Nevada Crane, MD;  Location: [facility];  Service: Ophthalmology;  Laterality: Right;  diabetic - oral meds   CATARACT EXTRACTION W/PHACO Right 07/27/2017   Procedure: CATARACT EXTRACTION PHACO AND INTRAOCULAR LENS PLACEMENT (IOC) LEFT DIABETIC TORIC;  Surgeon: Nevada Crane, MD;  Location: [facility];  Service: Ophthalmology;  Laterality: Right;  Diabetic - oral meds   COLONOSCOPY     COLONOSCOPY WITH PROPOFOL N/A 07/20/2019   Procedure: COLONOSCOPY WITH PROPOFOL;  Surgeon: Toledo, Boykin Nearing, MD;  Location: [facility];  Service: Gastroenterology;  Laterality: N/A;   cystocele, cystourethropexy  12/87   dexa      increase T - 3.1 spine, normal femur 1/04   ESOPHAGOGASTRODUODENOSCOPY     ESOPHAGOGASTRODUODENOSCOPY (EGD) WITH PROPOFOL N/A 04/16/2018   Procedure: ESOPHAGOGASTRODUODENOSCOPY (EGD) WITH PROPOFOL;  Surgeon: Scot Jun, MD;  Location: [facility];  Service: Endoscopy;  Laterality: N/A;   ESOPHAGOGASTRODUODENOSCOPY (EGD) WITH PROPOFOL N/A 07/20/2019   Procedure: ESOPHAGOGASTRODUODENOSCOPY (EGD) WITH PROPOFOL;  Surgeon: Toledo, Boykin Nearing, MD;  Location: [facility];  Service: Gastroenterology;  Laterality: N/A;   EYE SURGERY     hysterectomy (other)  1980   IR CT HEAD LTD  08/31/2023   IR PERCUTANEOUS ART THROMBECTOMY/INFUSION INTRACRANIAL INC DIAG ANGIO  08/31/2023   IR US GUIDE VASC ACCESS RIGHT  08/31/2023   RADIOLOGY WITH ANESTHESIA N/A 08/31/2023   Procedure: IR WITH ANESTHESIA;  Surgeon: Radiologist, Medication, MD;  Location: [facility] OR;  Service: Radiology;  Laterality: N/A;   TUBAL LIGATION  1978   vaginal deliveries     x2   HPI:  Pt is a 82 y.o. female  has a past medical history of Arrhythmia, Arthritis, Basal cell carcinoma, Cancer (HCC), CMV (cytomegalovirus infection) (HCC), Colon polyps, Diabetes mellitus, type 2 (HCC), Duodenitis, Dysrhythmia, Fatty liver disease, nonalcoholic, FH: colonic polyps, GERD (gastroesophageal reflux disease), Heart murmur, Hemorrhoid (.), Hyperlipidemia, Hypertension, IBS (irritable bowel syndrome), Osteoporosis, and Pancreatitis.  Recent left MCA stroke that was treated with TNK as well as left MCA endovascular thrombectomy achieving a TICI2b reperfusion with prior MRI showing patchy left MCA and bilateral ACA scattered infarcts.    MRI on 09/07/2023: Much of the previously seen abnormal diffusion restriction has resolved. Persistent diffusion restriction of the left precentral  gyrus, caudate tail and medial frontal gyrus.  2. New focus of acute ischemia at the left centrum semiovale.  3. Petechial blood products at the superior left hemisphere.    CXR: No active disease.   Assessment / Plan / Recommendation Clinical Impression   Pt seen for speech/language evaluation today.  Pt awake/alert, verbal and speaking in general conversation w/ mild hesitancies; reports inconsistent word finding difficulty. Husband present. There is a reported h/o memory/attention difficulties at baseline per chart notes. Both pt and Husband stated they felt pt's speech was "not back to normal for her" though she is able to communicate wants/needs appropriately.  OF NOTE: pt was recently assessed by ST services at previous hospital admit 09/02/2023 which revealed: pt "scored 20/30 on the SLUMS (a score of 27 or above is considered WFL) characterized by deficits related to sustained attention and problem solving.".   Pt on RA, afebrile. WBC wnl.     Evaluation completed via informal means and portions of Western Aphasia Battery Revised (Bedside Record Form) and informal Cognitive assessment. Note, pt was in a double occupancy room. Pt presented w/ functional Cognitive abilities in areas of Orientation, basic verbal problem-solving situations given cues, basic commonalities/differences. Pt exhibited deficits in the areas of lengthy attention, fluency, and Memory. Pt presented w/ functional receptive and expressive language abilities w/ mild deficits in more challenging expressive language tasks of convergent/divergent skills. Semantic cues were helpful to increase pt's responses/accuracy. Some responses appeared to lack further detail. Time was given also for information processing. Pt's speech  was c/b inconsistent anomia and hesitancies. Pt was stimulable w/ verbal cues.  No motor speech deficits were noted. Speech intelligibility was 100%.   In setting of pt's improved communication abilities since admit but PRIOR cognitive-communication deficits as assessed at prior hospital admit(see SLUMS assessment/score), as well as new neurological changes(see MRIs), recommend  Post-Acute ST services for above mentioned deficits and ongoing assessment of communication abilities in ADLs; more formal assessment in a structured setting. This was discussed w/ both pt/Husband and MD who all agreed. Recommend frequent/constant Supervision/assistance at D/C for safety, support.    Pt/Husband made aware of results of assessment, changes to functional communication following stroke, basic communication strategies, and POC for f/u at next venue of care. Recommended f/u skilled ST services post Discharge to address communication skills/needs in functional communication ADLs.     SLP Assessment  SLP Recommendation/Assessment: All further Speech Lanaguage Pathology  needs can be addressed in the next venue of care SLP Visit Diagnosis: Aphasia (R47.01);Cognitive communication deficit (R41.841)    Recommendations for follow up therapy are one component of a multi-disciplinary discharge planning process, led by the attending physician.  Recommendations may be updated based on patient status, additional functional criteria and insurance authorization.    Follow Up Recommendations  Follow physician's recommendations for discharge plan and follow up therapies    Assistance Recommended at Discharge  Frequent or constant Supervision/Assistance  Functional Status Assessment Patient has had a recent decline in their functional status and demonstrates the ability to make significant improvements in function in a reasonable and predictable amount of time.  Frequency and Duration  (TBD)   (TBD)      SLP Evaluation Cognition  Overall Cognitive Status: History of cognitive impairments - at baseline (memory/attention deficits reported at baseline) Arousal/Alertness: Awake/alert Orientation Level: Oriented X4 Year: 2024 Month: October Day of Week: Correct Attention: Sustained;Focused Focused Attention: Appears intact Sustained Attention: Impaired (inconsistent) Sustained Attention  Impairment: Verbal basic;Functional basic [facility]) Memory: Impaired Memory Impairment: Decreased recall of new information Awareness: Impaired Awareness Impairment: Anticipatory impairment Problem Solving: Impaired Problem Solving Impairment: Verbal complex;Functional complex Executive Function: Reasoning Reasoning: Appears intact Behaviors:  (n/a) Safety/Judgment: Appears intact (grossly but assess further) Comments: was able to ID problem situations and a response in picture       Comprehension  Auditory Comprehension Overall Auditory Comprehension: Appears within functional limits for tasks assessed Yes/No Questions: Within Functional Limits (self-corrected on complex question) Commands: Within Functional Limits (1-2 step) Conversation: Simple Other Conversation Comments: hesitancies Interfering Components: Processing speed EffectiveTechniques: Extra processing time;Pausing;Repetition Visual Recognition/Discrimination Discrimination: Not tested Reading Comprehension Reading Status: Within funtional limits    Expression Expression Primary Mode of Expression: Verbal Verbal Expression Overall Verbal Expression: Impaired Initiation: No impairment Automatic Speech:  (WFL) Level of Generative/Spontaneous Verbalization: Sentence Repetition: No impairment Naming: No impairment Confrontation: Impaired Divergent: 50-74% accurate Pragmatics: No impairment Effective Techniques: Semantic cues Non-Verbal Means of Communication: Not applicable Written Expression Dominant Hand: Right Written Expression: Not tested   Oral / Motor  Oral Motor/Sensory Function Overall Oral Motor/Sensory Function: Within functional limits (grossly WFL w/ slight R upper lip asymmetry?) Motor Speech Overall Motor Speech: Appears within functional limits for tasks assessed Respiration: Within functional limits Phonation: Normal Resonance: Within functional limits Articulation: Within functional  limitis Intelligibility: Intelligible Motor Planning: Witnin functional limits              Jerilynn Som, MS, CCC-SLP Speech Language Pathologist Rehab Services; [facility] [phone] (ascom) Russel Morain 09/08/2023, 3:02 PM

## 2023-09-08 NOTE — Assessment & Plan Note (Signed)
Stat MRI ordered to identify any new ischemic or hemorrhagic process.

## 2023-09-08 NOTE — Progress Notes (Signed)
OT Cancellation Note  Patient Details Name: Molly [name] MRN: [id number] DOB: [birth year]   Cancelled Treatment:    Reason Eval/Treat Not Completed: Other (comment) (pt off the floor for EEG, OT will re attempt as able) Oleta Mouse, OTD OTR/L  09/08/23, 1:00 PM

## 2023-09-09 ENCOUNTER — Encounter
Admission: EM | Disposition: A | Discharge status: Home / Self Care | Payer: Self-pay | Source: Home / Self Care | Attending: Emergency Medicine

## 2023-09-09 ENCOUNTER — Encounter: Payer: Self-pay | Admitting: Cardiology

## 2023-09-09 DIAGNOSIS — R4701 Aphasia: Secondary | ICD-10-CM | POA: Diagnosis not present

## 2023-09-09 DIAGNOSIS — I6389 Other cerebral infarction: Secondary | ICD-10-CM

## 2023-09-09 HISTORY — PX: LOOP RECORDER INSERTION: EP1214

## 2023-09-09 LAB — GLUCOSE, CAPILLARY
Glucose-Capillary: 214 mg/dL — ABNORMAL HIGH (ref 70–99)
Glucose-Capillary: 311 mg/dL — ABNORMAL HIGH (ref 70–99)

## 2023-09-09 SURGERY — LOOP RECORDER INSERTION
Anesthesia: LOCAL | Laterality: Left

## 2023-09-09 MED ORDER — ROSUVASTATIN CALCIUM 40 MG PO TABS: 40.0000 mg | ORAL_TABLET | Freq: Every day | ORAL | 0 refills | Status: AC

## 2023-09-09 MED ORDER — CLOPIDOGREL BISULFATE 75 MG PO TABS: 75.0000 mg | ORAL_TABLET | Freq: Every day | ORAL | 1 refills | Status: AC

## 2023-09-09 MED ORDER — LIDOCAINE-EPINEPHRINE (PF) 1 %-1:200000 IJ SOLN
INTRAMUSCULAR | Status: DC | PRN
  Administered 2023-09-09: 5 mL

## 2023-09-09 MED ORDER — METOPROLOL TARTRATE 25 MG PO TABS: 25.0000 mg | ORAL_TABLET | Freq: Two times a day (BID) | ORAL | 0 refills | Status: AC

## 2023-09-09 MED ORDER — LANTUS SOLOSTAR 100 UNIT/ML ~~LOC~~ SOPN: 35.0000 [IU] | PEN_INJECTOR | Freq: Every day | SUBCUTANEOUS | 11 refills | Status: AC

## 2023-09-09 MED ORDER — LIDOCAINE-EPINEPHRINE (PF) 2 %-1:200000 IJ SOLN
INTRAMUSCULAR | Status: AC
  Filled 2023-09-09: qty 20

## 2023-09-09 SURGICAL SUPPLY — 2 items
MONITOR REVEAL LINQ II (Prosthesis & Implant Heart) IMPLANT
PACK LOOP INSERTION (CUSTOM PROCEDURE TRAY) IMPLANT

## 2023-09-09 NOTE — Discharge Instructions (Signed)
Care After Your Loop Recorder  You have a [employer] Loop Recorder   Monitor your cardiac device site for redness, swelling, and drainage. Call the device clinic at [phone] if you experience these symptoms or fever/chills.  If you notice bleeding from your site, hold firm, but gently pressure with two fingers for 5 minutes. Dried blood on the steri-strips when removing the outer bandage is normal.   Keep the large square bandage on your site for 24 hours and then you may remove it yourself. Keep the steri-strips underneath in place.   You may shower after 72 hours / 3 days from your procedure with the steri-strips in place. They will usually fall off on their own, or may be removed after 10 days. Pat dry.   Avoid lotions, ointments, or perfumes over your incision until it is well-healed.  Please do not submerge in water until your site is completely healed.   Your device is MRI compatible.   Remote monitoring is used to monitor your cardiac device from home. This monitoring is scheduled every month by our office. It allows us to keep an eye on the function of your device to ensure it is working properly.    

## 2023-09-09 NOTE — Discharge Summary (Signed)
the mA and/or kV according to patient size and/or use of iterative reconstruction technique. COMPARISON:  Same day MRI. FINDINGS: Brain: Small known acute infarcts better characterized on the same day MRI. No progressive mass effect or acute hemorrhage. No hydrocephalus, extra-axial collection or mass lesion/mass effect. Vascular: No hyperdense vessel. Skull: No acute fracture. Sinuses/Orbits: No acute finding. Other: No mastoid effusions. IMPRESSION: Small known acute  infarcts better characterized on the same day MRI. No progressive mass effect or acute hemorrhage. Electronically Signed   By: Feliberto Harts M.D.   On: 09/01/2023 17:26   ECHOCARDIOGRAM COMPLETE BUBBLE STUDY  Result Date: 09/01/2023    ECHOCARDIOGRAM REPORT   Patient Name:   Molly [name] Date of Exam: 09/01/2023 Medical Rec #:  [id number]   Height:       66.0 in Accession #:    [id number]  Weight:       195.5 lb Date of Birth:  09/10/1941   BSA:          1.981 m Patient Age:    82 years    BP:           147/64 mmHg Patient Gender: F           HR:           69 bpm. Exam Location:  Inpatient Procedure: 2D Echo, Cardiac Doppler, Color Doppler and Saline Contrast Bubble            Study Indications:    Stroke I63.9  History:        Patient has no prior history of Echocardiogram examinations.                 Stroke; Risk Factors:Former Smoker, Dyslipidemia, Diabetes and                 Hypertension.  Sonographer:    Dondra Prader RVT RCS Referring Phys: [phone] Reyne Dumas [facility] IMPRESSIONS  1. Left ventricular ejection fraction, by estimation, is 60 to 65%. The left ventricle has normal function. The left ventricle has no regional wall motion abnormalities. Left ventricular diastolic parameters are indeterminate.  2. Right ventricular systolic function is normal. The right ventricular size is normal.  3. The mitral valve is degenerative. Moderate mitral valve regurgitation. No evidence of mitral stenosis. Moderate mitral annular calcification.  4. The aortic valve is normal in structure. Aortic valve regurgitation is mild to moderate. No aortic stenosis is present.  5. The inferior vena cava is normal in size with greater than 50% respiratory variability, suggesting right atrial pressure of 3 mmHg.  6. Agitated saline contrast bubble study was negative, with no evidence of any interatrial shunt. Conclusion(s)/Recommendation(s): No intracardiac source of embolism detected on this transthoracic study. Consider a  transesophageal echocardiogram to exclude cardiac source of embolism if clinically indicated. FINDINGS  Left Ventricle: Left ventricular ejection fraction, by estimation, is 60 to 65%. The left ventricle has normal function. The left ventricle has no regional wall motion abnormalities. The left ventricular internal cavity size was normal in size. There is  no left ventricular hypertrophy. Left ventricular diastolic parameters are indeterminate. Right Ventricle: The right ventricular size is normal. No increase in right ventricular wall thickness. Right ventricular systolic function is normal. Left Atrium: Left atrial size was normal in size. Right Atrium: Right atrial size was normal in size. Pericardium: There is no evidence of pericardial effusion. Mitral Valve: The mitral valve is degenerative in appearance. There is mild thickening of the mitral valve leaflet(s).  Physician Discharge Summary   Patient: Molly [name] MRN: [id number] DOB: 02-Apr-1941  Admit date:     09/07/2023  Discharge date: 09/09/23  Discharge Physician: Alba Cory   PCP: Marguarite Arbour, MD   Recommendations at discharge:   Follow up with EP for results of Loop recorder.  Needs to follow up with Neurology for post admission with stroke.   Discharge Diagnoses: Principal Problem:   Aphasia Active Problems:   Acute ischemic stroke (HCC)   DM (diabetes mellitus) (HCC)   Hyperlipidemia   Essential hypertension   Abnormal TSH  Resolved Problems:   * No resolved hospital problems. *  Hospital Course: Molly [name] is a 82 y.o. female with medical history significant for Diabetes mellitus type 2, hypertension, hyperlipidemia, GERD, recent acute ischemic stroke status post TNK and thrombectomy for left MCA occlusion with TICI2b reperfusion.  Her symptoms resolved and neurologic intact on discharge  on 10/27.  Upon discharge patient was to have cardiac monitoring for identifying underlying atrial fibrillation.   Patient was noted to have aphasia / and family called EMS.   Patient states that yesterday night she had also tingling in her left arm along with chest pain and headache which has since resolved.  In ed pt is nonfocal and has returned to baseline and has ambulated.  In the emergency room code stroke was called  MRI brain 9/28 show previous diffusion restriction has resolved.  Persistent diffusion restriction of the left precentral gyrus, called out the talar and medial frontal gyrus.  New focus of acute ischemia at the left centrum semi oval.   EP was consulted patient underwent a loop recorder.  Neurology was consulted as well.  Per neurology most likely recrudescence of old stroke symptoms versus new stroke.  EEG was also obtained and was negative for seizures. Recommendation  to continue dual antiplatelet therapy for 3 months.  Assessment and Plan: New stroke  versus stroke recrudescence in the left MCA territory  Neurology consulted, recommended, Cryptogenic etiology of the stroke-requires further workup including prolonged cardiac monitoring for underlying cardioembolic etiology  Continue dual antiplatelet therapy aspirin and Plavix for 3 months followed by aspirin only.  Continue Crestor 40 mg p.o. daily. LDL 58 below goal <70 MRI brain 10/28:  CTA negative for large vessel occlusion EEG: cortical dysfunction in left frontotemporal region likely secondary to underlying stroke. No seizures or epileptiform discharges were seen throughout the recording.  EP cardiology consulted, underwent implantable loop recorder insertion 10/30. PT and OT evaluation done, recommended home health PT, SLP evaluation done, recommended SLP follow-up with an outpatient Stable to discharge home. HH ordered.    Hypertension Allow permissive hypertension Resume lisinopril and metoprolol (Lower home dose due to bradycardia.   IDDM T2 Resume Home regimen.  Continue diabetic diet        Consultants: Neurology  Procedures performed: Loop record insertion.  Disposition: Home Diet recommendation:  Discharge Diet Orders (From admission, onward)     Start     Ordered   09/09/23 0000  Diet - low sodium heart healthy        09/09/23 1109           Cardiac diet DISCHARGE MEDICATION: Allergies as of 09/09/2023       Reactions   Alendronate Nausea Only   Codeine Nausea Only   Lansoprazole    REACTION: hurts stomach   Sulfonamide Derivatives    Childhood reaction (doesn't remember)   Adhesive [tape] Rash   Blisters  Physician Discharge Summary   Patient: Molly [name] MRN: [id number] DOB: 02-Apr-1941  Admit date:     09/07/2023  Discharge date: 09/09/23  Discharge Physician: Alba Cory   PCP: Marguarite Arbour, MD   Recommendations at discharge:   Follow up with EP for results of Loop recorder.  Needs to follow up with Neurology for post admission with stroke.   Discharge Diagnoses: Principal Problem:   Aphasia Active Problems:   Acute ischemic stroke (HCC)   DM (diabetes mellitus) (HCC)   Hyperlipidemia   Essential hypertension   Abnormal TSH  Resolved Problems:   * No resolved hospital problems. *  Hospital Course: Molly [name] is a 82 y.o. female with medical history significant for Diabetes mellitus type 2, hypertension, hyperlipidemia, GERD, recent acute ischemic stroke status post TNK and thrombectomy for left MCA occlusion with TICI2b reperfusion.  Her symptoms resolved and neurologic intact on discharge  on 10/27.  Upon discharge patient was to have cardiac monitoring for identifying underlying atrial fibrillation.   Patient was noted to have aphasia / and family called EMS.   Patient states that yesterday night she had also tingling in her left arm along with chest pain and headache which has since resolved.  In ed pt is nonfocal and has returned to baseline and has ambulated.  In the emergency room code stroke was called  MRI brain 9/28 show previous diffusion restriction has resolved.  Persistent diffusion restriction of the left precentral gyrus, called out the talar and medial frontal gyrus.  New focus of acute ischemia at the left centrum semi oval.   EP was consulted patient underwent a loop recorder.  Neurology was consulted as well.  Per neurology most likely recrudescence of old stroke symptoms versus new stroke.  EEG was also obtained and was negative for seizures. Recommendation  to continue dual antiplatelet therapy for 3 months.  Assessment and Plan: New stroke  versus stroke recrudescence in the left MCA territory  Neurology consulted, recommended, Cryptogenic etiology of the stroke-requires further workup including prolonged cardiac monitoring for underlying cardioembolic etiology  Continue dual antiplatelet therapy aspirin and Plavix for 3 months followed by aspirin only.  Continue Crestor 40 mg p.o. daily. LDL 58 below goal <70 MRI brain 10/28:  CTA negative for large vessel occlusion EEG: cortical dysfunction in left frontotemporal region likely secondary to underlying stroke. No seizures or epileptiform discharges were seen throughout the recording.  EP cardiology consulted, underwent implantable loop recorder insertion 10/30. PT and OT evaluation done, recommended home health PT, SLP evaluation done, recommended SLP follow-up with an outpatient Stable to discharge home. HH ordered.    Hypertension Allow permissive hypertension Resume lisinopril and metoprolol (Lower home dose due to bradycardia.   IDDM T2 Resume Home regimen.  Continue diabetic diet        Consultants: Neurology  Procedures performed: Loop record insertion.  Disposition: Home Diet recommendation:  Discharge Diet Orders (From admission, onward)     Start     Ordered   09/09/23 0000  Diet - low sodium heart healthy        09/09/23 1109           Cardiac diet DISCHARGE MEDICATION: Allergies as of 09/09/2023       Reactions   Alendronate Nausea Only   Codeine Nausea Only   Lansoprazole    REACTION: hurts stomach   Sulfonamide Derivatives    Childhood reaction (doesn't remember)   Adhesive [tape] Rash   Blisters  Physician Discharge Summary   Patient: Molly [name] MRN: [id number] DOB: 02-Apr-1941  Admit date:     09/07/2023  Discharge date: 09/09/23  Discharge Physician: Alba Cory   PCP: Marguarite Arbour, MD   Recommendations at discharge:   Follow up with EP for results of Loop recorder.  Needs to follow up with Neurology for post admission with stroke.   Discharge Diagnoses: Principal Problem:   Aphasia Active Problems:   Acute ischemic stroke (HCC)   DM (diabetes mellitus) (HCC)   Hyperlipidemia   Essential hypertension   Abnormal TSH  Resolved Problems:   * No resolved hospital problems. *  Hospital Course: Molly [name] is a 82 y.o. female with medical history significant for Diabetes mellitus type 2, hypertension, hyperlipidemia, GERD, recent acute ischemic stroke status post TNK and thrombectomy for left MCA occlusion with TICI2b reperfusion.  Her symptoms resolved and neurologic intact on discharge  on 10/27.  Upon discharge patient was to have cardiac monitoring for identifying underlying atrial fibrillation.   Patient was noted to have aphasia / and family called EMS.   Patient states that yesterday night she had also tingling in her left arm along with chest pain and headache which has since resolved.  In ed pt is nonfocal and has returned to baseline and has ambulated.  In the emergency room code stroke was called  MRI brain 9/28 show previous diffusion restriction has resolved.  Persistent diffusion restriction of the left precentral gyrus, called out the talar and medial frontal gyrus.  New focus of acute ischemia at the left centrum semi oval.   EP was consulted patient underwent a loop recorder.  Neurology was consulted as well.  Per neurology most likely recrudescence of old stroke symptoms versus new stroke.  EEG was also obtained and was negative for seizures. Recommendation  to continue dual antiplatelet therapy for 3 months.  Assessment and Plan: New stroke  versus stroke recrudescence in the left MCA territory  Neurology consulted, recommended, Cryptogenic etiology of the stroke-requires further workup including prolonged cardiac monitoring for underlying cardioembolic etiology  Continue dual antiplatelet therapy aspirin and Plavix for 3 months followed by aspirin only.  Continue Crestor 40 mg p.o. daily. LDL 58 below goal <70 MRI brain 10/28:  CTA negative for large vessel occlusion EEG: cortical dysfunction in left frontotemporal region likely secondary to underlying stroke. No seizures or epileptiform discharges were seen throughout the recording.  EP cardiology consulted, underwent implantable loop recorder insertion 10/30. PT and OT evaluation done, recommended home health PT, SLP evaluation done, recommended SLP follow-up with an outpatient Stable to discharge home. HH ordered.    Hypertension Allow permissive hypertension Resume lisinopril and metoprolol (Lower home dose due to bradycardia.   IDDM T2 Resume Home regimen.  Continue diabetic diet        Consultants: Neurology  Procedures performed: Loop record insertion.  Disposition: Home Diet recommendation:  Discharge Diet Orders (From admission, onward)     Start     Ordered   09/09/23 0000  Diet - low sodium heart healthy        09/09/23 1109           Cardiac diet DISCHARGE MEDICATION: Allergies as of 09/09/2023       Reactions   Alendronate Nausea Only   Codeine Nausea Only   Lansoprazole    REACTION: hurts stomach   Sulfonamide Derivatives    Childhood reaction (doesn't remember)   Adhesive [tape] Rash   Blisters  Physician Discharge Summary   Patient: Molly [name] MRN: [id number] DOB: 02-Apr-1941  Admit date:     09/07/2023  Discharge date: 09/09/23  Discharge Physician: Alba Cory   PCP: Marguarite Arbour, MD   Recommendations at discharge:   Follow up with EP for results of Loop recorder.  Needs to follow up with Neurology for post admission with stroke.   Discharge Diagnoses: Principal Problem:   Aphasia Active Problems:   Acute ischemic stroke (HCC)   DM (diabetes mellitus) (HCC)   Hyperlipidemia   Essential hypertension   Abnormal TSH  Resolved Problems:   * No resolved hospital problems. *  Hospital Course: Molly [name] is a 82 y.o. female with medical history significant for Diabetes mellitus type 2, hypertension, hyperlipidemia, GERD, recent acute ischemic stroke status post TNK and thrombectomy for left MCA occlusion with TICI2b reperfusion.  Her symptoms resolved and neurologic intact on discharge  on 10/27.  Upon discharge patient was to have cardiac monitoring for identifying underlying atrial fibrillation.   Patient was noted to have aphasia / and family called EMS.   Patient states that yesterday night she had also tingling in her left arm along with chest pain and headache which has since resolved.  In ed pt is nonfocal and has returned to baseline and has ambulated.  In the emergency room code stroke was called  MRI brain 9/28 show previous diffusion restriction has resolved.  Persistent diffusion restriction of the left precentral gyrus, called out the talar and medial frontal gyrus.  New focus of acute ischemia at the left centrum semi oval.   EP was consulted patient underwent a loop recorder.  Neurology was consulted as well.  Per neurology most likely recrudescence of old stroke symptoms versus new stroke.  EEG was also obtained and was negative for seizures. Recommendation  to continue dual antiplatelet therapy for 3 months.  Assessment and Plan: New stroke  versus stroke recrudescence in the left MCA territory  Neurology consulted, recommended, Cryptogenic etiology of the stroke-requires further workup including prolonged cardiac monitoring for underlying cardioembolic etiology  Continue dual antiplatelet therapy aspirin and Plavix for 3 months followed by aspirin only.  Continue Crestor 40 mg p.o. daily. LDL 58 below goal <70 MRI brain 10/28:  CTA negative for large vessel occlusion EEG: cortical dysfunction in left frontotemporal region likely secondary to underlying stroke. No seizures or epileptiform discharges were seen throughout the recording.  EP cardiology consulted, underwent implantable loop recorder insertion 10/30. PT and OT evaluation done, recommended home health PT, SLP evaluation done, recommended SLP follow-up with an outpatient Stable to discharge home. HH ordered.    Hypertension Allow permissive hypertension Resume lisinopril and metoprolol (Lower home dose due to bradycardia.   IDDM T2 Resume Home regimen.  Continue diabetic diet        Consultants: Neurology  Procedures performed: Loop record insertion.  Disposition: Home Diet recommendation:  Discharge Diet Orders (From admission, onward)     Start     Ordered   09/09/23 0000  Diet - low sodium heart healthy        09/09/23 1109           Cardiac diet DISCHARGE MEDICATION: Allergies as of 09/09/2023       Reactions   Alendronate Nausea Only   Codeine Nausea Only   Lansoprazole    REACTION: hurts stomach   Sulfonamide Derivatives    Childhood reaction (doesn't remember)   Adhesive [tape] Rash   Blisters  the mA and/or kV according to patient size and/or use of iterative reconstruction technique. COMPARISON:  Same day MRI. FINDINGS: Brain: Small known acute infarcts better characterized on the same day MRI. No progressive mass effect or acute hemorrhage. No hydrocephalus, extra-axial collection or mass lesion/mass effect. Vascular: No hyperdense vessel. Skull: No acute fracture. Sinuses/Orbits: No acute finding. Other: No mastoid effusions. IMPRESSION: Small known acute  infarcts better characterized on the same day MRI. No progressive mass effect or acute hemorrhage. Electronically Signed   By: Feliberto Harts M.D.   On: 09/01/2023 17:26   ECHOCARDIOGRAM COMPLETE BUBBLE STUDY  Result Date: 09/01/2023    ECHOCARDIOGRAM REPORT   Patient Name:   Molly [name] Date of Exam: 09/01/2023 Medical Rec #:  [id number]   Height:       66.0 in Accession #:    [id number]  Weight:       195.5 lb Date of Birth:  09/10/1941   BSA:          1.981 m Patient Age:    82 years    BP:           147/64 mmHg Patient Gender: F           HR:           69 bpm. Exam Location:  Inpatient Procedure: 2D Echo, Cardiac Doppler, Color Doppler and Saline Contrast Bubble            Study Indications:    Stroke I63.9  History:        Patient has no prior history of Echocardiogram examinations.                 Stroke; Risk Factors:Former Smoker, Dyslipidemia, Diabetes and                 Hypertension.  Sonographer:    Dondra Prader RVT RCS Referring Phys: [phone] Reyne Dumas [facility] IMPRESSIONS  1. Left ventricular ejection fraction, by estimation, is 60 to 65%. The left ventricle has normal function. The left ventricle has no regional wall motion abnormalities. Left ventricular diastolic parameters are indeterminate.  2. Right ventricular systolic function is normal. The right ventricular size is normal.  3. The mitral valve is degenerative. Moderate mitral valve regurgitation. No evidence of mitral stenosis. Moderate mitral annular calcification.  4. The aortic valve is normal in structure. Aortic valve regurgitation is mild to moderate. No aortic stenosis is present.  5. The inferior vena cava is normal in size with greater than 50% respiratory variability, suggesting right atrial pressure of 3 mmHg.  6. Agitated saline contrast bubble study was negative, with no evidence of any interatrial shunt. Conclusion(s)/Recommendation(s): No intracardiac source of embolism detected on this transthoracic study. Consider a  transesophageal echocardiogram to exclude cardiac source of embolism if clinically indicated. FINDINGS  Left Ventricle: Left ventricular ejection fraction, by estimation, is 60 to 65%. The left ventricle has normal function. The left ventricle has no regional wall motion abnormalities. The left ventricular internal cavity size was normal in size. There is  no left ventricular hypertrophy. Left ventricular diastolic parameters are indeterminate. Right Ventricle: The right ventricular size is normal. No increase in right ventricular wall thickness. Right ventricular systolic function is normal. Left Atrium: Left atrial size was normal in size. Right Atrium: Right atrial size was normal in size. Pericardium: There is no evidence of pericardial effusion. Mitral Valve: The mitral valve is degenerative in appearance. There is mild thickening of the mitral valve leaflet(s).  the mA and/or kV according to patient size and/or use of iterative reconstruction technique. COMPARISON:  Same day MRI. FINDINGS: Brain: Small known acute infarcts better characterized on the same day MRI. No progressive mass effect or acute hemorrhage. No hydrocephalus, extra-axial collection or mass lesion/mass effect. Vascular: No hyperdense vessel. Skull: No acute fracture. Sinuses/Orbits: No acute finding. Other: No mastoid effusions. IMPRESSION: Small known acute  infarcts better characterized on the same day MRI. No progressive mass effect or acute hemorrhage. Electronically Signed   By: Feliberto Harts M.D.   On: 09/01/2023 17:26   ECHOCARDIOGRAM COMPLETE BUBBLE STUDY  Result Date: 09/01/2023    ECHOCARDIOGRAM REPORT   Patient Name:   Molly [name] Date of Exam: 09/01/2023 Medical Rec #:  [id number]   Height:       66.0 in Accession #:    [id number]  Weight:       195.5 lb Date of Birth:  09/10/1941   BSA:          1.981 m Patient Age:    82 years    BP:           147/64 mmHg Patient Gender: F           HR:           69 bpm. Exam Location:  Inpatient Procedure: 2D Echo, Cardiac Doppler, Color Doppler and Saline Contrast Bubble            Study Indications:    Stroke I63.9  History:        Patient has no prior history of Echocardiogram examinations.                 Stroke; Risk Factors:Former Smoker, Dyslipidemia, Diabetes and                 Hypertension.  Sonographer:    Dondra Prader RVT RCS Referring Phys: [phone] Reyne Dumas [facility] IMPRESSIONS  1. Left ventricular ejection fraction, by estimation, is 60 to 65%. The left ventricle has normal function. The left ventricle has no regional wall motion abnormalities. Left ventricular diastolic parameters are indeterminate.  2. Right ventricular systolic function is normal. The right ventricular size is normal.  3. The mitral valve is degenerative. Moderate mitral valve regurgitation. No evidence of mitral stenosis. Moderate mitral annular calcification.  4. The aortic valve is normal in structure. Aortic valve regurgitation is mild to moderate. No aortic stenosis is present.  5. The inferior vena cava is normal in size with greater than 50% respiratory variability, suggesting right atrial pressure of 3 mmHg.  6. Agitated saline contrast bubble study was negative, with no evidence of any interatrial shunt. Conclusion(s)/Recommendation(s): No intracardiac source of embolism detected on this transthoracic study. Consider a  transesophageal echocardiogram to exclude cardiac source of embolism if clinically indicated. FINDINGS  Left Ventricle: Left ventricular ejection fraction, by estimation, is 60 to 65%. The left ventricle has normal function. The left ventricle has no regional wall motion abnormalities. The left ventricular internal cavity size was normal in size. There is  no left ventricular hypertrophy. Left ventricular diastolic parameters are indeterminate. Right Ventricle: The right ventricular size is normal. No increase in right ventricular wall thickness. Right ventricular systolic function is normal. Left Atrium: Left atrial size was normal in size. Right Atrium: Right atrial size was normal in size. Pericardium: There is no evidence of pericardial effusion. Mitral Valve: The mitral valve is degenerative in appearance. There is mild thickening of the mitral valve leaflet(s).  the mA and/or kV according to patient size and/or use of iterative reconstruction technique. COMPARISON:  Same day MRI. FINDINGS: Brain: Small known acute infarcts better characterized on the same day MRI. No progressive mass effect or acute hemorrhage. No hydrocephalus, extra-axial collection or mass lesion/mass effect. Vascular: No hyperdense vessel. Skull: No acute fracture. Sinuses/Orbits: No acute finding. Other: No mastoid effusions. IMPRESSION: Small known acute  infarcts better characterized on the same day MRI. No progressive mass effect or acute hemorrhage. Electronically Signed   By: Feliberto Harts M.D.   On: 09/01/2023 17:26   ECHOCARDIOGRAM COMPLETE BUBBLE STUDY  Result Date: 09/01/2023    ECHOCARDIOGRAM REPORT   Patient Name:   Molly [name] Date of Exam: 09/01/2023 Medical Rec #:  [id number]   Height:       66.0 in Accession #:    [id number]  Weight:       195.5 lb Date of Birth:  09/10/1941   BSA:          1.981 m Patient Age:    82 years    BP:           147/64 mmHg Patient Gender: F           HR:           69 bpm. Exam Location:  Inpatient Procedure: 2D Echo, Cardiac Doppler, Color Doppler and Saline Contrast Bubble            Study Indications:    Stroke I63.9  History:        Patient has no prior history of Echocardiogram examinations.                 Stroke; Risk Factors:Former Smoker, Dyslipidemia, Diabetes and                 Hypertension.  Sonographer:    Dondra Prader RVT RCS Referring Phys: [phone] Reyne Dumas [facility] IMPRESSIONS  1. Left ventricular ejection fraction, by estimation, is 60 to 65%. The left ventricle has normal function. The left ventricle has no regional wall motion abnormalities. Left ventricular diastolic parameters are indeterminate.  2. Right ventricular systolic function is normal. The right ventricular size is normal.  3. The mitral valve is degenerative. Moderate mitral valve regurgitation. No evidence of mitral stenosis. Moderate mitral annular calcification.  4. The aortic valve is normal in structure. Aortic valve regurgitation is mild to moderate. No aortic stenosis is present.  5. The inferior vena cava is normal in size with greater than 50% respiratory variability, suggesting right atrial pressure of 3 mmHg.  6. Agitated saline contrast bubble study was negative, with no evidence of any interatrial shunt. Conclusion(s)/Recommendation(s): No intracardiac source of embolism detected on this transthoracic study. Consider a  transesophageal echocardiogram to exclude cardiac source of embolism if clinically indicated. FINDINGS  Left Ventricle: Left ventricular ejection fraction, by estimation, is 60 to 65%. The left ventricle has normal function. The left ventricle has no regional wall motion abnormalities. The left ventricular internal cavity size was normal in size. There is  no left ventricular hypertrophy. Left ventricular diastolic parameters are indeterminate. Right Ventricle: The right ventricular size is normal. No increase in right ventricular wall thickness. Right ventricular systolic function is normal. Left Atrium: Left atrial size was normal in size. Right Atrium: Right atrial size was normal in size. Pericardium: There is no evidence of pericardial effusion. Mitral Valve: The mitral valve is degenerative in appearance. There is mild thickening of the mitral valve leaflet(s).  Physician Discharge Summary   Patient: Molly [name] MRN: [id number] DOB: 02-Apr-1941  Admit date:     09/07/2023  Discharge date: 09/09/23  Discharge Physician: Alba Cory   PCP: Marguarite Arbour, MD   Recommendations at discharge:   Follow up with EP for results of Loop recorder.  Needs to follow up with Neurology for post admission with stroke.   Discharge Diagnoses: Principal Problem:   Aphasia Active Problems:   Acute ischemic stroke (HCC)   DM (diabetes mellitus) (HCC)   Hyperlipidemia   Essential hypertension   Abnormal TSH  Resolved Problems:   * No resolved hospital problems. *  Hospital Course: Molly [name] is a 82 y.o. female with medical history significant for Diabetes mellitus type 2, hypertension, hyperlipidemia, GERD, recent acute ischemic stroke status post TNK and thrombectomy for left MCA occlusion with TICI2b reperfusion.  Her symptoms resolved and neurologic intact on discharge  on 10/27.  Upon discharge patient was to have cardiac monitoring for identifying underlying atrial fibrillation.   Patient was noted to have aphasia / and family called EMS.   Patient states that yesterday night she had also tingling in her left arm along with chest pain and headache which has since resolved.  In ed pt is nonfocal and has returned to baseline and has ambulated.  In the emergency room code stroke was called  MRI brain 9/28 show previous diffusion restriction has resolved.  Persistent diffusion restriction of the left precentral gyrus, called out the talar and medial frontal gyrus.  New focus of acute ischemia at the left centrum semi oval.   EP was consulted patient underwent a loop recorder.  Neurology was consulted as well.  Per neurology most likely recrudescence of old stroke symptoms versus new stroke.  EEG was also obtained and was negative for seizures. Recommendation  to continue dual antiplatelet therapy for 3 months.  Assessment and Plan: New stroke  versus stroke recrudescence in the left MCA territory  Neurology consulted, recommended, Cryptogenic etiology of the stroke-requires further workup including prolonged cardiac monitoring for underlying cardioembolic etiology  Continue dual antiplatelet therapy aspirin and Plavix for 3 months followed by aspirin only.  Continue Crestor 40 mg p.o. daily. LDL 58 below goal <70 MRI brain 10/28:  CTA negative for large vessel occlusion EEG: cortical dysfunction in left frontotemporal region likely secondary to underlying stroke. No seizures or epileptiform discharges were seen throughout the recording.  EP cardiology consulted, underwent implantable loop recorder insertion 10/30. PT and OT evaluation done, recommended home health PT, SLP evaluation done, recommended SLP follow-up with an outpatient Stable to discharge home. HH ordered.    Hypertension Allow permissive hypertension Resume lisinopril and metoprolol (Lower home dose due to bradycardia.   IDDM T2 Resume Home regimen.  Continue diabetic diet        Consultants: Neurology  Procedures performed: Loop record insertion.  Disposition: Home Diet recommendation:  Discharge Diet Orders (From admission, onward)     Start     Ordered   09/09/23 0000  Diet - low sodium heart healthy        09/09/23 1109           Cardiac diet DISCHARGE MEDICATION: Allergies as of 09/09/2023       Reactions   Alendronate Nausea Only   Codeine Nausea Only   Lansoprazole    REACTION: hurts stomach   Sulfonamide Derivatives    Childhood reaction (doesn't remember)   Adhesive [tape] Rash   Blisters  Physician Discharge Summary   Patient: Molly [name] MRN: [id number] DOB: 02-Apr-1941  Admit date:     09/07/2023  Discharge date: 09/09/23  Discharge Physician: Alba Cory   PCP: Marguarite Arbour, MD   Recommendations at discharge:   Follow up with EP for results of Loop recorder.  Needs to follow up with Neurology for post admission with stroke.   Discharge Diagnoses: Principal Problem:   Aphasia Active Problems:   Acute ischemic stroke (HCC)   DM (diabetes mellitus) (HCC)   Hyperlipidemia   Essential hypertension   Abnormal TSH  Resolved Problems:   * No resolved hospital problems. *  Hospital Course: Molly [name] is a 82 y.o. female with medical history significant for Diabetes mellitus type 2, hypertension, hyperlipidemia, GERD, recent acute ischemic stroke status post TNK and thrombectomy for left MCA occlusion with TICI2b reperfusion.  Her symptoms resolved and neurologic intact on discharge  on 10/27.  Upon discharge patient was to have cardiac monitoring for identifying underlying atrial fibrillation.   Patient was noted to have aphasia / and family called EMS.   Patient states that yesterday night she had also tingling in her left arm along with chest pain and headache which has since resolved.  In ed pt is nonfocal and has returned to baseline and has ambulated.  In the emergency room code stroke was called  MRI brain 9/28 show previous diffusion restriction has resolved.  Persistent diffusion restriction of the left precentral gyrus, called out the talar and medial frontal gyrus.  New focus of acute ischemia at the left centrum semi oval.   EP was consulted patient underwent a loop recorder.  Neurology was consulted as well.  Per neurology most likely recrudescence of old stroke symptoms versus new stroke.  EEG was also obtained and was negative for seizures. Recommendation  to continue dual antiplatelet therapy for 3 months.  Assessment and Plan: New stroke  versus stroke recrudescence in the left MCA territory  Neurology consulted, recommended, Cryptogenic etiology of the stroke-requires further workup including prolonged cardiac monitoring for underlying cardioembolic etiology  Continue dual antiplatelet therapy aspirin and Plavix for 3 months followed by aspirin only.  Continue Crestor 40 mg p.o. daily. LDL 58 below goal <70 MRI brain 10/28:  CTA negative for large vessel occlusion EEG: cortical dysfunction in left frontotemporal region likely secondary to underlying stroke. No seizures or epileptiform discharges were seen throughout the recording.  EP cardiology consulted, underwent implantable loop recorder insertion 10/30. PT and OT evaluation done, recommended home health PT, SLP evaluation done, recommended SLP follow-up with an outpatient Stable to discharge home. HH ordered.    Hypertension Allow permissive hypertension Resume lisinopril and metoprolol (Lower home dose due to bradycardia.   IDDM T2 Resume Home regimen.  Continue diabetic diet        Consultants: Neurology  Procedures performed: Loop record insertion.  Disposition: Home Diet recommendation:  Discharge Diet Orders (From admission, onward)     Start     Ordered   09/09/23 0000  Diet - low sodium heart healthy        09/09/23 1109           Cardiac diet DISCHARGE MEDICATION: Allergies as of 09/09/2023       Reactions   Alendronate Nausea Only   Codeine Nausea Only   Lansoprazole    REACTION: hurts stomach   Sulfonamide Derivatives    Childhood reaction (doesn't remember)   Adhesive [tape] Rash   Blisters  Physician Discharge Summary   Patient: Molly [name] MRN: [id number] DOB: 02-Apr-1941  Admit date:     09/07/2023  Discharge date: 09/09/23  Discharge Physician: Alba Cory   PCP: Marguarite Arbour, MD   Recommendations at discharge:   Follow up with EP for results of Loop recorder.  Needs to follow up with Neurology for post admission with stroke.   Discharge Diagnoses: Principal Problem:   Aphasia Active Problems:   Acute ischemic stroke (HCC)   DM (diabetes mellitus) (HCC)   Hyperlipidemia   Essential hypertension   Abnormal TSH  Resolved Problems:   * No resolved hospital problems. *  Hospital Course: Molly [name] is a 82 y.o. female with medical history significant for Diabetes mellitus type 2, hypertension, hyperlipidemia, GERD, recent acute ischemic stroke status post TNK and thrombectomy for left MCA occlusion with TICI2b reperfusion.  Her symptoms resolved and neurologic intact on discharge  on 10/27.  Upon discharge patient was to have cardiac monitoring for identifying underlying atrial fibrillation.   Patient was noted to have aphasia / and family called EMS.   Patient states that yesterday night she had also tingling in her left arm along with chest pain and headache which has since resolved.  In ed pt is nonfocal and has returned to baseline and has ambulated.  In the emergency room code stroke was called  MRI brain 9/28 show previous diffusion restriction has resolved.  Persistent diffusion restriction of the left precentral gyrus, called out the talar and medial frontal gyrus.  New focus of acute ischemia at the left centrum semi oval.   EP was consulted patient underwent a loop recorder.  Neurology was consulted as well.  Per neurology most likely recrudescence of old stroke symptoms versus new stroke.  EEG was also obtained and was negative for seizures. Recommendation  to continue dual antiplatelet therapy for 3 months.  Assessment and Plan: New stroke  versus stroke recrudescence in the left MCA territory  Neurology consulted, recommended, Cryptogenic etiology of the stroke-requires further workup including prolonged cardiac monitoring for underlying cardioembolic etiology  Continue dual antiplatelet therapy aspirin and Plavix for 3 months followed by aspirin only.  Continue Crestor 40 mg p.o. daily. LDL 58 below goal <70 MRI brain 10/28:  CTA negative for large vessel occlusion EEG: cortical dysfunction in left frontotemporal region likely secondary to underlying stroke. No seizures or epileptiform discharges were seen throughout the recording.  EP cardiology consulted, underwent implantable loop recorder insertion 10/30. PT and OT evaluation done, recommended home health PT, SLP evaluation done, recommended SLP follow-up with an outpatient Stable to discharge home. HH ordered.    Hypertension Allow permissive hypertension Resume lisinopril and metoprolol (Lower home dose due to bradycardia.   IDDM T2 Resume Home regimen.  Continue diabetic diet        Consultants: Neurology  Procedures performed: Loop record insertion.  Disposition: Home Diet recommendation:  Discharge Diet Orders (From admission, onward)     Start     Ordered   09/09/23 0000  Diet - low sodium heart healthy        09/09/23 1109           Cardiac diet DISCHARGE MEDICATION: Allergies as of 09/09/2023       Reactions   Alendronate Nausea Only   Codeine Nausea Only   Lansoprazole    REACTION: hurts stomach   Sulfonamide Derivatives    Childhood reaction (doesn't remember)   Adhesive [tape] Rash   Blisters  the mA and/or kV according to patient size and/or use of iterative reconstruction technique. COMPARISON:  Same day MRI. FINDINGS: Brain: Small known acute infarcts better characterized on the same day MRI. No progressive mass effect or acute hemorrhage. No hydrocephalus, extra-axial collection or mass lesion/mass effect. Vascular: No hyperdense vessel. Skull: No acute fracture. Sinuses/Orbits: No acute finding. Other: No mastoid effusions. IMPRESSION: Small known acute  infarcts better characterized on the same day MRI. No progressive mass effect or acute hemorrhage. Electronically Signed   By: Feliberto Harts M.D.   On: 09/01/2023 17:26   ECHOCARDIOGRAM COMPLETE BUBBLE STUDY  Result Date: 09/01/2023    ECHOCARDIOGRAM REPORT   Patient Name:   Molly [name] Date of Exam: 09/01/2023 Medical Rec #:  [id number]   Height:       66.0 in Accession #:    [id number]  Weight:       195.5 lb Date of Birth:  09/10/1941   BSA:          1.981 m Patient Age:    82 years    BP:           147/64 mmHg Patient Gender: F           HR:           69 bpm. Exam Location:  Inpatient Procedure: 2D Echo, Cardiac Doppler, Color Doppler and Saline Contrast Bubble            Study Indications:    Stroke I63.9  History:        Patient has no prior history of Echocardiogram examinations.                 Stroke; Risk Factors:Former Smoker, Dyslipidemia, Diabetes and                 Hypertension.  Sonographer:    Dondra Prader RVT RCS Referring Phys: [phone] Reyne Dumas [facility] IMPRESSIONS  1. Left ventricular ejection fraction, by estimation, is 60 to 65%. The left ventricle has normal function. The left ventricle has no regional wall motion abnormalities. Left ventricular diastolic parameters are indeterminate.  2. Right ventricular systolic function is normal. The right ventricular size is normal.  3. The mitral valve is degenerative. Moderate mitral valve regurgitation. No evidence of mitral stenosis. Moderate mitral annular calcification.  4. The aortic valve is normal in structure. Aortic valve regurgitation is mild to moderate. No aortic stenosis is present.  5. The inferior vena cava is normal in size with greater than 50% respiratory variability, suggesting right atrial pressure of 3 mmHg.  6. Agitated saline contrast bubble study was negative, with no evidence of any interatrial shunt. Conclusion(s)/Recommendation(s): No intracardiac source of embolism detected on this transthoracic study. Consider a  transesophageal echocardiogram to exclude cardiac source of embolism if clinically indicated. FINDINGS  Left Ventricle: Left ventricular ejection fraction, by estimation, is 60 to 65%. The left ventricle has normal function. The left ventricle has no regional wall motion abnormalities. The left ventricular internal cavity size was normal in size. There is  no left ventricular hypertrophy. Left ventricular diastolic parameters are indeterminate. Right Ventricle: The right ventricular size is normal. No increase in right ventricular wall thickness. Right ventricular systolic function is normal. Left Atrium: Left atrial size was normal in size. Right Atrium: Right atrial size was normal in size. Pericardium: There is no evidence of pericardial effusion. Mitral Valve: The mitral valve is degenerative in appearance. There is mild thickening of the mitral valve leaflet(s).  the mA and/or kV according to patient size and/or use of iterative reconstruction technique. COMPARISON:  Same day MRI. FINDINGS: Brain: Small known acute infarcts better characterized on the same day MRI. No progressive mass effect or acute hemorrhage. No hydrocephalus, extra-axial collection or mass lesion/mass effect. Vascular: No hyperdense vessel. Skull: No acute fracture. Sinuses/Orbits: No acute finding. Other: No mastoid effusions. IMPRESSION: Small known acute  infarcts better characterized on the same day MRI. No progressive mass effect or acute hemorrhage. Electronically Signed   By: Feliberto Harts M.D.   On: 09/01/2023 17:26   ECHOCARDIOGRAM COMPLETE BUBBLE STUDY  Result Date: 09/01/2023    ECHOCARDIOGRAM REPORT   Patient Name:   Molly [name] Date of Exam: 09/01/2023 Medical Rec #:  [id number]   Height:       66.0 in Accession #:    [id number]  Weight:       195.5 lb Date of Birth:  09/10/1941   BSA:          1.981 m Patient Age:    82 years    BP:           147/64 mmHg Patient Gender: F           HR:           69 bpm. Exam Location:  Inpatient Procedure: 2D Echo, Cardiac Doppler, Color Doppler and Saline Contrast Bubble            Study Indications:    Stroke I63.9  History:        Patient has no prior history of Echocardiogram examinations.                 Stroke; Risk Factors:Former Smoker, Dyslipidemia, Diabetes and                 Hypertension.  Sonographer:    Dondra Prader RVT RCS Referring Phys: [phone] Reyne Dumas [facility] IMPRESSIONS  1. Left ventricular ejection fraction, by estimation, is 60 to 65%. The left ventricle has normal function. The left ventricle has no regional wall motion abnormalities. Left ventricular diastolic parameters are indeterminate.  2. Right ventricular systolic function is normal. The right ventricular size is normal.  3. The mitral valve is degenerative. Moderate mitral valve regurgitation. No evidence of mitral stenosis. Moderate mitral annular calcification.  4. The aortic valve is normal in structure. Aortic valve regurgitation is mild to moderate. No aortic stenosis is present.  5. The inferior vena cava is normal in size with greater than 50% respiratory variability, suggesting right atrial pressure of 3 mmHg.  6. Agitated saline contrast bubble study was negative, with no evidence of any interatrial shunt. Conclusion(s)/Recommendation(s): No intracardiac source of embolism detected on this transthoracic study. Consider a  transesophageal echocardiogram to exclude cardiac source of embolism if clinically indicated. FINDINGS  Left Ventricle: Left ventricular ejection fraction, by estimation, is 60 to 65%. The left ventricle has normal function. The left ventricle has no regional wall motion abnormalities. The left ventricular internal cavity size was normal in size. There is  no left ventricular hypertrophy. Left ventricular diastolic parameters are indeterminate. Right Ventricle: The right ventricular size is normal. No increase in right ventricular wall thickness. Right ventricular systolic function is normal. Left Atrium: Left atrial size was normal in size. Right Atrium: Right atrial size was normal in size. Pericardium: There is no evidence of pericardial effusion. Mitral Valve: The mitral valve is degenerative in appearance. There is mild thickening of the mitral valve leaflet(s).  the mA and/or kV according to patient size and/or use of iterative reconstruction technique. COMPARISON:  Same day MRI. FINDINGS: Brain: Small known acute infarcts better characterized on the same day MRI. No progressive mass effect or acute hemorrhage. No hydrocephalus, extra-axial collection or mass lesion/mass effect. Vascular: No hyperdense vessel. Skull: No acute fracture. Sinuses/Orbits: No acute finding. Other: No mastoid effusions. IMPRESSION: Small known acute  infarcts better characterized on the same day MRI. No progressive mass effect or acute hemorrhage. Electronically Signed   By: Feliberto Harts M.D.   On: 09/01/2023 17:26   ECHOCARDIOGRAM COMPLETE BUBBLE STUDY  Result Date: 09/01/2023    ECHOCARDIOGRAM REPORT   Patient Name:   Molly [name] Date of Exam: 09/01/2023 Medical Rec #:  [id number]   Height:       66.0 in Accession #:    [id number]  Weight:       195.5 lb Date of Birth:  09/10/1941   BSA:          1.981 m Patient Age:    82 years    BP:           147/64 mmHg Patient Gender: F           HR:           69 bpm. Exam Location:  Inpatient Procedure: 2D Echo, Cardiac Doppler, Color Doppler and Saline Contrast Bubble            Study Indications:    Stroke I63.9  History:        Patient has no prior history of Echocardiogram examinations.                 Stroke; Risk Factors:Former Smoker, Dyslipidemia, Diabetes and                 Hypertension.  Sonographer:    Dondra Prader RVT RCS Referring Phys: [phone] Reyne Dumas [facility] IMPRESSIONS  1. Left ventricular ejection fraction, by estimation, is 60 to 65%. The left ventricle has normal function. The left ventricle has no regional wall motion abnormalities. Left ventricular diastolic parameters are indeterminate.  2. Right ventricular systolic function is normal. The right ventricular size is normal.  3. The mitral valve is degenerative. Moderate mitral valve regurgitation. No evidence of mitral stenosis. Moderate mitral annular calcification.  4. The aortic valve is normal in structure. Aortic valve regurgitation is mild to moderate. No aortic stenosis is present.  5. The inferior vena cava is normal in size with greater than 50% respiratory variability, suggesting right atrial pressure of 3 mmHg.  6. Agitated saline contrast bubble study was negative, with no evidence of any interatrial shunt. Conclusion(s)/Recommendation(s): No intracardiac source of embolism detected on this transthoracic study. Consider a  transesophageal echocardiogram to exclude cardiac source of embolism if clinically indicated. FINDINGS  Left Ventricle: Left ventricular ejection fraction, by estimation, is 60 to 65%. The left ventricle has normal function. The left ventricle has no regional wall motion abnormalities. The left ventricular internal cavity size was normal in size. There is  no left ventricular hypertrophy. Left ventricular diastolic parameters are indeterminate. Right Ventricle: The right ventricular size is normal. No increase in right ventricular wall thickness. Right ventricular systolic function is normal. Left Atrium: Left atrial size was normal in size. Right Atrium: Right atrial size was normal in size. Pericardium: There is no evidence of pericardial effusion. Mitral Valve: The mitral valve is degenerative in appearance. There is mild thickening of the mitral valve leaflet(s).  Physician Discharge Summary   Patient: Molly [name] MRN: [id number] DOB: 02-Apr-1941  Admit date:     09/07/2023  Discharge date: 09/09/23  Discharge Physician: Alba Cory   PCP: Marguarite Arbour, MD   Recommendations at discharge:   Follow up with EP for results of Loop recorder.  Needs to follow up with Neurology for post admission with stroke.   Discharge Diagnoses: Principal Problem:   Aphasia Active Problems:   Acute ischemic stroke (HCC)   DM (diabetes mellitus) (HCC)   Hyperlipidemia   Essential hypertension   Abnormal TSH  Resolved Problems:   * No resolved hospital problems. *  Hospital Course: Molly [name] is a 82 y.o. female with medical history significant for Diabetes mellitus type 2, hypertension, hyperlipidemia, GERD, recent acute ischemic stroke status post TNK and thrombectomy for left MCA occlusion with TICI2b reperfusion.  Her symptoms resolved and neurologic intact on discharge  on 10/27.  Upon discharge patient was to have cardiac monitoring for identifying underlying atrial fibrillation.   Patient was noted to have aphasia / and family called EMS.   Patient states that yesterday night she had also tingling in her left arm along with chest pain and headache which has since resolved.  In ed pt is nonfocal and has returned to baseline and has ambulated.  In the emergency room code stroke was called  MRI brain 9/28 show previous diffusion restriction has resolved.  Persistent diffusion restriction of the left precentral gyrus, called out the talar and medial frontal gyrus.  New focus of acute ischemia at the left centrum semi oval.   EP was consulted patient underwent a loop recorder.  Neurology was consulted as well.  Per neurology most likely recrudescence of old stroke symptoms versus new stroke.  EEG was also obtained and was negative for seizures. Recommendation  to continue dual antiplatelet therapy for 3 months.  Assessment and Plan: New stroke  versus stroke recrudescence in the left MCA territory  Neurology consulted, recommended, Cryptogenic etiology of the stroke-requires further workup including prolonged cardiac monitoring for underlying cardioembolic etiology  Continue dual antiplatelet therapy aspirin and Plavix for 3 months followed by aspirin only.  Continue Crestor 40 mg p.o. daily. LDL 58 below goal <70 MRI brain 10/28:  CTA negative for large vessel occlusion EEG: cortical dysfunction in left frontotemporal region likely secondary to underlying stroke. No seizures or epileptiform discharges were seen throughout the recording.  EP cardiology consulted, underwent implantable loop recorder insertion 10/30. PT and OT evaluation done, recommended home health PT, SLP evaluation done, recommended SLP follow-up with an outpatient Stable to discharge home. HH ordered.    Hypertension Allow permissive hypertension Resume lisinopril and metoprolol (Lower home dose due to bradycardia.   IDDM T2 Resume Home regimen.  Continue diabetic diet        Consultants: Neurology  Procedures performed: Loop record insertion.  Disposition: Home Diet recommendation:  Discharge Diet Orders (From admission, onward)     Start     Ordered   09/09/23 0000  Diet - low sodium heart healthy        09/09/23 1109           Cardiac diet DISCHARGE MEDICATION: Allergies as of 09/09/2023       Reactions   Alendronate Nausea Only   Codeine Nausea Only   Lansoprazole    REACTION: hurts stomach   Sulfonamide Derivatives    Childhood reaction (doesn't remember)   Adhesive [tape] Rash   Blisters  Physician Discharge Summary   Patient: Molly [name] MRN: [id number] DOB: 02-Apr-1941  Admit date:     09/07/2023  Discharge date: 09/09/23  Discharge Physician: Alba Cory   PCP: Marguarite Arbour, MD   Recommendations at discharge:   Follow up with EP for results of Loop recorder.  Needs to follow up with Neurology for post admission with stroke.   Discharge Diagnoses: Principal Problem:   Aphasia Active Problems:   Acute ischemic stroke (HCC)   DM (diabetes mellitus) (HCC)   Hyperlipidemia   Essential hypertension   Abnormal TSH  Resolved Problems:   * No resolved hospital problems. *  Hospital Course: Molly [name] is a 82 y.o. female with medical history significant for Diabetes mellitus type 2, hypertension, hyperlipidemia, GERD, recent acute ischemic stroke status post TNK and thrombectomy for left MCA occlusion with TICI2b reperfusion.  Her symptoms resolved and neurologic intact on discharge  on 10/27.  Upon discharge patient was to have cardiac monitoring for identifying underlying atrial fibrillation.   Patient was noted to have aphasia / and family called EMS.   Patient states that yesterday night she had also tingling in her left arm along with chest pain and headache which has since resolved.  In ed pt is nonfocal and has returned to baseline and has ambulated.  In the emergency room code stroke was called  MRI brain 9/28 show previous diffusion restriction has resolved.  Persistent diffusion restriction of the left precentral gyrus, called out the talar and medial frontal gyrus.  New focus of acute ischemia at the left centrum semi oval.   EP was consulted patient underwent a loop recorder.  Neurology was consulted as well.  Per neurology most likely recrudescence of old stroke symptoms versus new stroke.  EEG was also obtained and was negative for seizures. Recommendation  to continue dual antiplatelet therapy for 3 months.  Assessment and Plan: New stroke  versus stroke recrudescence in the left MCA territory  Neurology consulted, recommended, Cryptogenic etiology of the stroke-requires further workup including prolonged cardiac monitoring for underlying cardioembolic etiology  Continue dual antiplatelet therapy aspirin and Plavix for 3 months followed by aspirin only.  Continue Crestor 40 mg p.o. daily. LDL 58 below goal <70 MRI brain 10/28:  CTA negative for large vessel occlusion EEG: cortical dysfunction in left frontotemporal region likely secondary to underlying stroke. No seizures or epileptiform discharges were seen throughout the recording.  EP cardiology consulted, underwent implantable loop recorder insertion 10/30. PT and OT evaluation done, recommended home health PT, SLP evaluation done, recommended SLP follow-up with an outpatient Stable to discharge home. HH ordered.    Hypertension Allow permissive hypertension Resume lisinopril and metoprolol (Lower home dose due to bradycardia.   IDDM T2 Resume Home regimen.  Continue diabetic diet        Consultants: Neurology  Procedures performed: Loop record insertion.  Disposition: Home Diet recommendation:  Discharge Diet Orders (From admission, onward)     Start     Ordered   09/09/23 0000  Diet - low sodium heart healthy        09/09/23 1109           Cardiac diet DISCHARGE MEDICATION: Allergies as of 09/09/2023       Reactions   Alendronate Nausea Only   Codeine Nausea Only   Lansoprazole    REACTION: hurts stomach   Sulfonamide Derivatives    Childhood reaction (doesn't remember)   Adhesive [tape] Rash   Blisters  Physician Discharge Summary   Patient: Molly [name] MRN: [id number] DOB: 02-Apr-1941  Admit date:     09/07/2023  Discharge date: 09/09/23  Discharge Physician: Alba Cory   PCP: Marguarite Arbour, MD   Recommendations at discharge:   Follow up with EP for results of Loop recorder.  Needs to follow up with Neurology for post admission with stroke.   Discharge Diagnoses: Principal Problem:   Aphasia Active Problems:   Acute ischemic stroke (HCC)   DM (diabetes mellitus) (HCC)   Hyperlipidemia   Essential hypertension   Abnormal TSH  Resolved Problems:   * No resolved hospital problems. *  Hospital Course: Molly [name] is a 82 y.o. female with medical history significant for Diabetes mellitus type 2, hypertension, hyperlipidemia, GERD, recent acute ischemic stroke status post TNK and thrombectomy for left MCA occlusion with TICI2b reperfusion.  Her symptoms resolved and neurologic intact on discharge  on 10/27.  Upon discharge patient was to have cardiac monitoring for identifying underlying atrial fibrillation.   Patient was noted to have aphasia / and family called EMS.   Patient states that yesterday night she had also tingling in her left arm along with chest pain and headache which has since resolved.  In ed pt is nonfocal and has returned to baseline and has ambulated.  In the emergency room code stroke was called  MRI brain 9/28 show previous diffusion restriction has resolved.  Persistent diffusion restriction of the left precentral gyrus, called out the talar and medial frontal gyrus.  New focus of acute ischemia at the left centrum semi oval.   EP was consulted patient underwent a loop recorder.  Neurology was consulted as well.  Per neurology most likely recrudescence of old stroke symptoms versus new stroke.  EEG was also obtained and was negative for seizures. Recommendation  to continue dual antiplatelet therapy for 3 months.  Assessment and Plan: New stroke  versus stroke recrudescence in the left MCA territory  Neurology consulted, recommended, Cryptogenic etiology of the stroke-requires further workup including prolonged cardiac monitoring for underlying cardioembolic etiology  Continue dual antiplatelet therapy aspirin and Plavix for 3 months followed by aspirin only.  Continue Crestor 40 mg p.o. daily. LDL 58 below goal <70 MRI brain 10/28:  CTA negative for large vessel occlusion EEG: cortical dysfunction in left frontotemporal region likely secondary to underlying stroke. No seizures or epileptiform discharges were seen throughout the recording.  EP cardiology consulted, underwent implantable loop recorder insertion 10/30. PT and OT evaluation done, recommended home health PT, SLP evaluation done, recommended SLP follow-up with an outpatient Stable to discharge home. HH ordered.    Hypertension Allow permissive hypertension Resume lisinopril and metoprolol (Lower home dose due to bradycardia.   IDDM T2 Resume Home regimen.  Continue diabetic diet        Consultants: Neurology  Procedures performed: Loop record insertion.  Disposition: Home Diet recommendation:  Discharge Diet Orders (From admission, onward)     Start     Ordered   09/09/23 0000  Diet - low sodium heart healthy        09/09/23 1109           Cardiac diet DISCHARGE MEDICATION: Allergies as of 09/09/2023       Reactions   Alendronate Nausea Only   Codeine Nausea Only   Lansoprazole    REACTION: hurts stomach   Sulfonamide Derivatives    Childhood reaction (doesn't remember)   Adhesive [tape] Rash   Blisters  Physician Discharge Summary   Patient: Molly [name] MRN: [id number] DOB: 02-Apr-1941  Admit date:     09/07/2023  Discharge date: 09/09/23  Discharge Physician: Alba Cory   PCP: Marguarite Arbour, MD   Recommendations at discharge:   Follow up with EP for results of Loop recorder.  Needs to follow up with Neurology for post admission with stroke.   Discharge Diagnoses: Principal Problem:   Aphasia Active Problems:   Acute ischemic stroke (HCC)   DM (diabetes mellitus) (HCC)   Hyperlipidemia   Essential hypertension   Abnormal TSH  Resolved Problems:   * No resolved hospital problems. *  Hospital Course: Molly [name] is a 82 y.o. female with medical history significant for Diabetes mellitus type 2, hypertension, hyperlipidemia, GERD, recent acute ischemic stroke status post TNK and thrombectomy for left MCA occlusion with TICI2b reperfusion.  Her symptoms resolved and neurologic intact on discharge  on 10/27.  Upon discharge patient was to have cardiac monitoring for identifying underlying atrial fibrillation.   Patient was noted to have aphasia / and family called EMS.   Patient states that yesterday night she had also tingling in her left arm along with chest pain and headache which has since resolved.  In ed pt is nonfocal and has returned to baseline and has ambulated.  In the emergency room code stroke was called  MRI brain 9/28 show previous diffusion restriction has resolved.  Persistent diffusion restriction of the left precentral gyrus, called out the talar and medial frontal gyrus.  New focus of acute ischemia at the left centrum semi oval.   EP was consulted patient underwent a loop recorder.  Neurology was consulted as well.  Per neurology most likely recrudescence of old stroke symptoms versus new stroke.  EEG was also obtained and was negative for seizures. Recommendation  to continue dual antiplatelet therapy for 3 months.  Assessment and Plan: New stroke  versus stroke recrudescence in the left MCA territory  Neurology consulted, recommended, Cryptogenic etiology of the stroke-requires further workup including prolonged cardiac monitoring for underlying cardioembolic etiology  Continue dual antiplatelet therapy aspirin and Plavix for 3 months followed by aspirin only.  Continue Crestor 40 mg p.o. daily. LDL 58 below goal <70 MRI brain 10/28:  CTA negative for large vessel occlusion EEG: cortical dysfunction in left frontotemporal region likely secondary to underlying stroke. No seizures or epileptiform discharges were seen throughout the recording.  EP cardiology consulted, underwent implantable loop recorder insertion 10/30. PT and OT evaluation done, recommended home health PT, SLP evaluation done, recommended SLP follow-up with an outpatient Stable to discharge home. HH ordered.    Hypertension Allow permissive hypertension Resume lisinopril and metoprolol (Lower home dose due to bradycardia.   IDDM T2 Resume Home regimen.  Continue diabetic diet        Consultants: Neurology  Procedures performed: Loop record insertion.  Disposition: Home Diet recommendation:  Discharge Diet Orders (From admission, onward)     Start     Ordered   09/09/23 0000  Diet - low sodium heart healthy        09/09/23 1109           Cardiac diet DISCHARGE MEDICATION: Allergies as of 09/09/2023       Reactions   Alendronate Nausea Only   Codeine Nausea Only   Lansoprazole    REACTION: hurts stomach   Sulfonamide Derivatives    Childhood reaction (doesn't remember)   Adhesive [tape] Rash   Blisters

## 2023-09-09 NOTE — Progress Notes (Signed)
Occupational Therapy Treatment Patient Details Name: JNYAH OTTESON MRN: [id number] DOB: [birth year] Today's Date: 09/09/2023   History of present illness Patient is a 82 year old female recently admitted to [facility] with right side weakness and dysarthria s/p TNK and thrombectomy. Was discharge home and developed aphasia, right side tingling. MRI of brain with new focus of acute ischemia at the left centrum semiovale   OT comments  Chart reviewed prior to tx session. Pt seen for OT treatment on this date. Tx session targeted improved ADL activity tolerance and independence. Pt alert and seemly oriented throughout session. Upon arrival to room Pt standing at bed side, talking to husband and nurse. Pt continues to have difficulty with multi-step tasks, improvement noted in ability to sustain attention on tasks.    Pt participated in P[employer] testing on this date while seated on EOB. Pt failed assessment making 3+ error and taking <5 mins to complete. Throughout testing Pt was able to approprietly stay on task despite multiple distractions within room. Pt often needed reminding to read aloud medications. Pt and husband educated on the need for assistance post d/c for medication management at this time. Pt making progress toward goals, will continue to follow POC. Discharge recommendation remains appropriate. OT will follow acutely.           If plan is discharge home, recommend the following:  A lot of help with bathing/dressing/bathroom;A little help with walking and/or transfers;Assist for transportation;Supervision due to cognitive status;Assistance with cooking/housework;Direct supervision/assist for financial management;Direct supervision/assist for medications management   Equipment Recommendations  Other (comment)    Recommendations for Other Services      Precautions / Restrictions Precautions Precautions: Fall Precaution Comments: SBP <180 Restrictions Weight Bearing Restrictions:  No       Mobility Bed Mobility Overal bed mobility: Modified Independent Bed Mobility: Sit to Supine       Sit to supine: Modified independent (Device/Increase time)   General bed mobility comments: No assistance required for bed mobility, EOB <> supine MOD I    Transfers Overall transfer level: Needs assistance Equipment used: None Transfers: Sit to/from Stand Sit to Stand: Supervision           General transfer comment: Pt amb to EOB with no DME + supervision ~ 48ft     Balance Overall balance assessment: Modified Independent Sitting-balance support: No upper extremity supported, Feet supported Sitting balance-Leahy Scale: Good Sitting balance - Comments: fair dynamic sitting balance during LB dressing   Standing balance support: No upper extremity supported, During functional activity Standing balance-Leahy Scale: Good Standing balance comment: Pt prolong standing and conversating with staff ~ no LOB noted                           ADL either performed or assessed with clinical judgement   ADL                                       Functional mobility during ADLs: Supervision/safety General ADL Comments: Standing with RN and husband in room, amb to bed with no DME + supervision assistance    Extremity/Trunk Assessment              Vision       Perception     Praxis      Cognition Arousal: Alert Behavior During Therapy: [facility]  for tasks assessed/performed Overall Cognitive Status: Impaired/Different from baseline Area of Impairment: Memory, Following commands, Safety/judgement, Problem solving, Attention, Awareness                   Current Attention Level: Sustained Memory: Decreased short-term memory Following Commands: Follows one step commands with increased time, Follows multi-step commands inconsistently Safety/Judgement: Decreased awareness of safety, Decreased awareness of deficits Awareness:  Emergent Problem Solving: Slow processing, Decreased initiation, Requires verbal cues, Difficulty sequencing, Requires tactile cues General Comments: Pt was alert and oriented, cooperative during session        Exercises Other Exercises Other Exercises: Edu pt/husband: medication management - safety after d/c with new medications    Shoulder Instructions       General Comments      Pertinent Vitals/ Pain       Pain Assessment Pain Assessment: No/denies pain  Home Living                                          Prior Functioning/Environment              Frequency  Min 1X/week        Progress Toward Goals  OT Goals(current goals can now be found in the care plan section)  Progress towards OT goals: Progressing toward goals     Plan      Co-evaluation                 AM-PAC OT "6 Clicks" Daily Activity     Outcome Measure   Help from another person eating meals?: A Little Help from another person taking care of personal grooming?: A Little Help from another person toileting, which includes using toliet, bedpan, or urinal?: A Little Help from another person bathing (including washing, rinsing, drying)?: A Little Help from another person to put on and taking off regular upper body clothing?: None Help from another person to put on and taking off regular lower body clothing?: None 6 Click Score: 20    End of Session    OT Visit Diagnosis: Unsteadiness on feet (R26.81);Muscle weakness (generalized) (M62.81);Other abnormalities of gait and mobility (R26.89)   Activity Tolerance Patient tolerated treatment well   Patient Left in bed;with family/visitor present;with call bell/phone within reach;with bed alarm set   Nurse Communication Mobility status        Time: 0955-1010 OT Time Calculation (min): 15 min  Charges: OT General Charges $OT Visit: 1 Visit OT Treatments $Cognitive Funtion inital: Initial 15 mins  [employer], OTS

## 2023-09-09 NOTE — Progress Notes (Signed)
NEUROLOGY CONSULT FOLLOW UP NOTE   Date of service: September 09, 2023 Patient Name: SABENA GISMONDI MRN:  [id number] DOB:  12-Aug-1941  Brief HPI  Roby Mckinzey Toth is a 82 y.o. female  has a past medical history of Arrhythmia, Arthritis, Basal cell carcinoma, Cancer (HCC), CMV (cytomegalovirus infection) (HCC), Colon polyps, Diabetes mellitus, type 2 (HCC), Duodenitis, Dysrhythmia, Fatty liver disease, nonalcoholic, FH: colonic polyps, GERD (gastroesophageal reflux disease), Heart murmur, Hemorrhoid (.), Hyperlipidemia, Hypertension, IBS (irritable bowel syndrome), Osteoporosis, and Pancreatitis.  Recent left MCA stroke that was treated with TNK as well as left MCA endovascular thrombectomy achieving a TICI2b reperfusion with prior MRI showing patchy left MCA and bilateral ACA scattered infarcts, no coming with last known well 2 PM on 09/07/2023, with difficulty of speech/aphasia-not able to form sentences.  Seen as a telestroke-not a candidate for TNK due to recent stroke.  CT angiography showed patent left MCA. Has been recommended CardioNet monitor as outpatient   Interval Hx/subjective  No acute events overnight This morning got ILR placed.  Vitals   Vitals:   09/08/23 1936 09/08/23 2336 09/09/23 0336 09/09/23 0845  BP: (!) 157/53 (!) 152/63 (!) 152/57 (!) 156/60  Pulse: 77 69 74 74  Resp:  18  16  Temp: 98.3 F (36.8 C) 97.6 F (36.4 C) (!) 97.4 F (36.3 C) 98 F (36.7 C)  TempSrc:  Oral Oral   SpO2: 98% 96% 100% 96%  Weight:      Height:         Body mass index is 27.78 kg/m.  Physical Exam  General: Awake alert in no distress HEENT: Normocephalic atraumatic Lungs: Clear Cardiovascular: Regular rhythm Neurological exam She is awake alert oriented x 3 There is mild loss of fluency, some hesitancy but other than that is able to follow all commands.  Is able to name simple objects.  Able to repeat. Cranial nerves: Pupils are equal round react light, extraocular movements intact,  visual fields full, very subtle right facial droop, tongue and palate midline. Motor examination: No drift in any of the extremities Sensation intact to light touch bilaterally. No dysmetria No neglect Gait cautious but able to walk with and without walker-with walker more stable, without walker somewhat unsteady on her feet. No change in exam today  Labs and Diagnostic Imaging   CBC:  Recent Labs  Lab 09/06/23 0548 09/07/23 1635  WBC 9.7 9.3  NEUTROABS  --  5.8  HGB 13.3 14.6  HCT 39.3 44.1  MCV 91.6 94.4  PLT 267 288    Basic Metabolic Panel:  Lab Results  Component Value Date   NA 139 09/07/2023   K 3.7 09/07/2023   CO2 23 09/07/2023   GLUCOSE 162 (H) 09/07/2023   BUN 17 09/07/2023   CREATININE 0.69 09/07/2023   CALCIUM 9.1 09/07/2023   GFRNONAA >60 09/07/2023   GFRAA >60 08/11/2019   Lipid Panel:  Lab Results  Component Value Date   LDLCALC 58 09/07/2023   HgbA1c:  Lab Results  Component Value Date   HGBA1C 6.7 (H) 08/31/2023   Urine Drug Screen:     Component Value Date/Time   LABOPIA NONE DETECTED 09/07/2023 2118   COCAINSCRNUR NONE DETECTED 09/07/2023 2118   LABBENZ NONE DETECTED 09/07/2023 2118   AMPHETMU NONE DETECTED 09/07/2023 2118   THCU NONE DETECTED 09/07/2023 2118   LABBARB NONE DETECTED 09/07/2023 2118    Alcohol Level     Component Value Date/Time   ETH <10 09/07/2023 1635  INR  Lab Results  Component Value Date   INR 1.0 09/07/2023   APTT  Lab Results  Component Value Date   APTT 30 09/07/2023    CT Head without contrast(Personally reviewed): No acute changes  CT angio Head and Neck with contrast(Personally reviewed): No ELVO.  Patent left MCA.  MRI Brain(Personally reviewed): Question of a small new area of restricted diffusion in the left corona radiata-I think this was present before and possibly reveals a much prominent area of restricted diffusion than before due to technique difference/slicing.  There is a small  possibility that this truly could be a new stroke as well.  rEEG:  No seizures.  Intermittent slowing in the left frontotemporal region likely secondary to underlying stroke.  Assessment   CHARLETTA MASLAK is a 82 y.o. female past medical history as above including that of her recent left MCA stroke status post TNK and endovascular revascularization who comes in for evaluation of another episode of aphasia.  Repeat MRI with a questionable new area of stroke. CT angiography head and neck with patent vasculature including the previously reperfused left MCA. Getting an EEG to rule out electrographic abnormality. Most likely recrudescence of old stroke symptoms but evaluation for seizure is reasonable. Was supposed to get a 30-day cardiac monitor as an outpatient after the discharge but has not received it yet Given that the etiology of stroke remains cryptogenic, some concern for prior cardiac arrhythmia listed in medical list without a clear answer, I would recommend an implantable loop recorder instead of a 30-day monitor with strong suspicion for underlying atrial fibrillation as etiology for strokes.  Impression New stroke versus stroke recrudescence in the left MCA territory-on my review and review with one of the colleagues, the new area read on MRI is likely progression of the stroke already seen a few days ago. Cryptogenic etiology of the stroke-requires further workup including prolonged cardiac monitoring for underlying cardioembolic etiology Evaluate for underlying cerebral electrographic disturbance such as a seizure due to underlying stroke--EEG negative for seizures.  Intermittent slowing over the left hemisphere likely secondary to underlying stroke. ILR placed   Recommendations  Continue dual antiplatelets as prescribed at discharge on 09/06/2023-aspirin and Plavix for 3 months followed by aspirin only. No AEDs for now Appreciate ILR placement per cardiology-will need continued  follow-up. Follow-up outpatient neurology as planned before. Discussed the plan with the patient, her husband at bedside and son Gala Romney over the phone. Plan also discussed with Dr. Deloria Lair  -- Milon Dikes, MD Neurologist [facility]

## 2023-09-09 NOTE — TOC Transition Note (Signed)
Transition of Care [facility]) - CM/SW Discharge Note   Patient Details  Name: Molly [name] MRN: [id number] Date of Birth: Feb 16, 1941  Transition of Care [facility]) CM/SW Contact:  Garret Reddish, RN Phone Number: 09/09/2023, 11:45 AM   Clinical Narrative:    Chart reviewed.  Noted that patient has orders for discharge today.    I have spoken with Mr. Lottie Dawson and he has not home health preference.  Mr. Willenbrink reports that patient has a rolling walker at home.    I have asked Morrie Sheldon  with [facility] to accept home health referral.  [facility] will provide home health PT and OT services.    I have informed staff nurse of the above information.     Final next level of care: Home w Home Health Services Barriers to Discharge: No Barriers Identified   Patient Goals and CMS Choice CMS Medicare.gov Compare Post Acute Care list provided to:: Patient Choice offered to / list presented to : Patient, Spouse  Discharge Placement                    Name of family member notified: Mr. Koelbl Patient and family notified of of transfer: 09/09/23  Discharge Plan and Services Additional resources added to the After Visit Summary for                            [facility] Arranged: PT, OT [facility] Agency: [facility] ([facility] Date HH Agency Contacted: 09/09/23   Representative spoke with at [facility] Agency: Morrie Sheldon  Social Determinants of Health (SDOH) Interventions SDOH Screenings   Food Insecurity: No Food Insecurity (09/07/2023)  Housing: Patient Declined (09/07/2023)  Transportation Needs: No Transportation Needs (09/07/2023)  Utilities: Not At Risk (09/07/2023)  Depression (PHQ2-9): Low Risk  (02/10/2023)  Tobacco Use: Medium Risk (09/08/2023)     Readmission Risk Interventions     No data to display

## 2023-09-09 NOTE — Progress Notes (Signed)
Pt received from procedure via bed in stable condition. Dressing site looks clean dry and intact.

## 2023-09-09 NOTE — Progress Notes (Signed)
   Rounding Note    Patient Name: Molly [name] Date of Encounter: 09/09/2023  [facility] Cardiologist: None   Subjective   NAEO.   Vital Signs    Vitals:   09/08/23 1629 09/08/23 1936 09/08/23 2336 09/09/23 0336  BP: (!) 136/51 (!) 157/53 (!) 152/63 (!) 152/57  Pulse:  77 69 74  Resp: 18  18   Temp: 98 F (36.7 C) 98.3 F (36.8 C) 97.6 F (36.4 C) (!) 97.4 F (36.3 C)  TempSrc: Oral  Oral Oral  SpO2:  98% 96% 100%  Weight:      Height:        Intake/Output Summary (Last 24 hours) at 09/09/2023 0722 Last data filed at 09/08/2023 2145 Gross per 24 hour  Intake 230 ml  Output --  Net 230 ml      09/07/2023   11:27 PM 09/07/2023    5:56 PM 09/07/2023    4:48 PM  Last 3 Weights  Weight (lbs) 161 lb 13.1 oz 169 lb 194 lb 0.1 oz  Weight (kg) 73.4 kg 76.658 kg 88 kg      Telemetry    Personally Reviewed  ECG    Personally Reviewed  Physical Exam   GEN: No acute distress.   Cardiac: RRR, no murmurs, rubs, or gallops.  Respiratory: Clear to auscultation bilaterally. Psych: Normal affect   Assessment & Plan    #Cryptogenic Stroke Pathophysiology of cryptogenic stroke was discussed in detail. I discussed the role of loop recorder monitoring in patients who have suffered a CVA/TIA. There has been no evidence of AF thus far in the patient's evaluation. Loop recorder monitors were discussed in detail including the implant procedure and its risks. I discussed the monthly monitoring costs associated with loop recorder monitoring. The patient would like to proceed with ILR implant.  Sheria Lang T. Lalla Brothers, MD, [facility], [facility] Cardiac Electrophysiology

## 2023-09-09 NOTE — Plan of Care (Signed)
  Problem: Education: Goal: Knowledge of disease or condition will improve Outcome: Progressing Goal: Knowledge of secondary prevention will improve (MUST DOCUMENT ALL) Outcome: Progressing Goal: Knowledge of patient specific risk factors will improve Molly [name] N/A or DELETE if not current risk factor) Outcome: Progressing   Problem: Ischemic Stroke/TIA Tissue Perfusion: Goal: Complications of ischemic stroke/TIA will be minimized Outcome: Progressing   Problem: Coping: Goal: Will verbalize positive feelings about self Outcome: Progressing Goal: Will identify appropriate support needs Outcome: Progressing   Problem: Health Behavior/Discharge Planning: Goal: Ability to manage health-related needs will improve Outcome: Progressing Goal: Goals will be collaboratively established with patient/family Outcome: Progressing   Problem: Nutrition: Goal: Risk of aspiration will decrease Outcome: Progressing Goal: Dietary intake will improve Outcome: Progressing   Problem: Education: Goal: Knowledge of General Education information will improve Description: Including pain rating scale, medication(s)/side effects and non-pharmacologic comfort measures Outcome: Progressing

## 2023-09-09 NOTE — Progress Notes (Signed)
I got a call from CCMD, her heart rate shows 32 in tele box,heart rate was taken manually, it was 62 beats /minute. MD made aware.

## 2023-09-09 NOTE — Progress Notes (Signed)
Physical Therapy Treatment Patient Details Name: Molly [name] MRN: [id number] DOB: 1941-09-19 Today's Date: 09/09/2023   History of Present Illness Patient is a 82 year old female recently admitted to [facility] with right side weakness and dysarthria s/p TNK and thrombectomy. Was discharge home and developed aphasia, right side tingling. MRI of brain with new focus of acute ischemia at the left centrum semiovale    PT Comments  Pt is progressing with mobility demonstrating Mod I with bed mobility and functional sitting balance, Supervision level for transfers, gait with RW, stair negotiation, and dynamic standing balance with intermittent UE support .  Pt seems to have more R side awareness, negotiating obstacles on right, during gait without hitting them and with Min to no cues.  Continued PT will progress pt with greater balance and increased safety with mobility with LRAD.   If plan is discharge home, recommend the following: Supervision due to cognitive status;Help with stairs or ramp for entrance;Assist for transportation   Can travel by private vehicle        Equipment Recommendations  None recommended by PT    Recommendations for Other Services       Precautions / Restrictions Precautions Precautions: Fall Precaution Comments: SBP <180 Restrictions Weight Bearing Restrictions: No     Mobility  Bed Mobility Overal bed mobility: Modified Independent Bed Mobility: Sit to Supine     Supine to sit: Modified independent (Device/Increase time)     General bed mobility comments: Pt sitting on EOB at end of session.    Transfers Overall transfer level: Needs assistance Equipment used: Rolling walker (2 wheels) Transfers: Sit to/from Stand, Bed to chair/wheelchair/BSC Sit to Stand: Supervision   Step pivot transfers: Supervision            Ambulation/Gait Ambulation/Gait assistance: Supervision Gait Distance (Feet): 200 Feet Assistive device: Rolling walker (2  wheels) Gait Pattern/deviations: Step-through pattern, WFL(Within Functional Limits)       General Gait Details: No bumping into objects on R side.  Gave cues for increased awarenessof right side and for visual scanning to the Right .   Stairs Stairs: Yes Stairs assistance: Supervision Stair Management: Two rails, Step to pattern Number of Stairs: 4 General stair comments: pt performed safely   Wheelchair Mobility     Tilt Bed    Modified Rankin (Stroke Patients Only)       Balance Overall balance assessment: Independent Sitting-balance support: No upper extremity supported, Feet supported Sitting balance-Leahy Scale: Good     Standing balance support: No upper extremity supported, During functional activity Standing balance-Leahy Scale: Good Standing balance comment: intermittent external support required. recommended continued use of rolling walker with dynamic activity and ambualtion                            Cognition Arousal: Alert Behavior During Therapy: WFL for tasks assessed/performed Overall Cognitive Status: History of cognitive impairments - at baseline                                          Exercises      General Comments        Pertinent Vitals/Pain Pain Assessment Pain Assessment: No/denies pain    Home Living  Prior Function            PT Goals (current goals can now be found in the care plan section) Acute Rehab PT Goals Patient Stated Goal: to return home PT Goal Formulation: With patient/family Time For Goal Achievement: 09/22/23 Potential to Achieve Goals: Good Additional Goals Additional Goal #1: patient will be independent when challenged outside base of support without UE support to decrease risk for falls Progress towards PT goals: Progressing toward goals    Frequency    Min 1X/week      PT Plan      Co-evaluation              AM-PAC PT  "6 Clicks" Mobility   Outcome Measure  Help needed turning from your back to your side while in a flat bed without using bedrails?: None Help needed moving from lying on your back to sitting on the side of a flat bed without using bedrails?: None Help needed moving to and from a bed to a chair (including a wheelchair)?: A Little Help needed standing up from a chair using your arms (e.g., wheelchair or bedside chair)?: A Little Help needed to walk in hospital room?: A Little Help needed climbing 3-5 steps with a railing? : A Little 6 Click Score: 20    End of Session Equipment Utilized During Treatment: Gait belt Activity Tolerance: Patient tolerated treatment well Patient left: in bed;with call bell/phone within reach;with bed alarm set;with family/visitor present Nurse Communication: Mobility status PT Visit Diagnosis: Unsteadiness on feet (R26.81);Muscle weakness (generalized) (M62.81)     Time: 1610-9604 PT Time Calculation (min) (ACUTE ONLY): 13 min  Charges:    $Gait Training: 8-22 mins PT General Charges $$ ACUTE PT VISIT: 1 Visit                     Hortencia Conradi, PTA  09/09/23, 11:16 AM

## 2023-09-09 NOTE — Progress Notes (Signed)
Pt sent for procedure via bed in stable condition.

## 2023-09-10 ENCOUNTER — Telehealth: Payer: Self-pay | Admitting: Cardiology

## 2023-09-10 DIAGNOSIS — I69320 Aphasia following cerebral infarction: Secondary | ICD-10-CM | POA: Diagnosis not present

## 2023-09-10 DIAGNOSIS — E119 Type 2 diabetes mellitus without complications: Secondary | ICD-10-CM | POA: Diagnosis not present

## 2023-09-10 DIAGNOSIS — I499 Cardiac arrhythmia, unspecified: Secondary | ICD-10-CM | POA: Diagnosis not present

## 2023-09-10 DIAGNOSIS — M810 Age-related osteoporosis without current pathological fracture: Secondary | ICD-10-CM | POA: Diagnosis not present

## 2023-09-10 DIAGNOSIS — E785 Hyperlipidemia, unspecified: Secondary | ICD-10-CM | POA: Diagnosis not present

## 2023-09-10 DIAGNOSIS — I10 Essential (primary) hypertension: Secondary | ICD-10-CM | POA: Diagnosis not present

## 2023-09-10 DIAGNOSIS — Z556 Problems related to health literacy: Secondary | ICD-10-CM | POA: Diagnosis not present

## 2023-09-10 DIAGNOSIS — Z87891 Personal history of nicotine dependence: Secondary | ICD-10-CM | POA: Diagnosis not present

## 2023-09-10 DIAGNOSIS — I700 Atherosclerosis of aorta: Secondary | ICD-10-CM | POA: Diagnosis not present

## 2023-09-10 DIAGNOSIS — Z7982 Long term (current) use of aspirin: Secondary | ICD-10-CM | POA: Diagnosis not present

## 2023-09-10 DIAGNOSIS — K219 Gastro-esophageal reflux disease without esophagitis: Secondary | ICD-10-CM | POA: Diagnosis not present

## 2023-09-10 DIAGNOSIS — Z8781 Personal history of (healed) traumatic fracture: Secondary | ICD-10-CM | POA: Diagnosis not present

## 2023-09-10 DIAGNOSIS — Z48812 Encounter for surgical aftercare following surgery on the circulatory system: Secondary | ICD-10-CM | POA: Diagnosis not present

## 2023-09-10 DIAGNOSIS — Z7984 Long term (current) use of oral hypoglycemic drugs: Secondary | ICD-10-CM | POA: Diagnosis not present

## 2023-09-10 DIAGNOSIS — Z794 Long term (current) use of insulin: Secondary | ICD-10-CM | POA: Diagnosis not present

## 2023-09-10 DIAGNOSIS — K760 Fatty (change of) liver, not elsewhere classified: Secondary | ICD-10-CM | POA: Diagnosis not present

## 2023-09-10 DIAGNOSIS — Z7902 Long term (current) use of antithrombotics/antiplatelets: Secondary | ICD-10-CM | POA: Diagnosis not present

## 2023-09-10 DIAGNOSIS — M1990 Unspecified osteoarthritis, unspecified site: Secondary | ICD-10-CM | POA: Diagnosis not present

## 2023-09-10 NOTE — Telephone Encounter (Signed)
Patient reports her monitor had a light blinking that was green. Pt advised this is fine, the device is connecting. As long as the light is not an amber orange color, and is green the monitor is working fine. Pt voiced understanding and appreciative of call.

## 2023-09-10 NOTE — Telephone Encounter (Signed)
Patient stated she had a loop recorder placed yesterday and has some questions regarding the equipment.

## 2023-09-15 DIAGNOSIS — R8290 Unspecified abnormal findings in urine: Secondary | ICD-10-CM | POA: Diagnosis not present

## 2023-09-15 DIAGNOSIS — E118 Type 2 diabetes mellitus with unspecified complications: Secondary | ICD-10-CM | POA: Diagnosis not present

## 2023-09-15 DIAGNOSIS — I10 Essential (primary) hypertension: Secondary | ICD-10-CM | POA: Diagnosis not present

## 2023-09-15 DIAGNOSIS — I639 Cerebral infarction, unspecified: Secondary | ICD-10-CM | POA: Diagnosis not present

## 2023-09-15 DIAGNOSIS — Z79899 Other long term (current) drug therapy: Secondary | ICD-10-CM | POA: Diagnosis not present

## 2023-09-15 DIAGNOSIS — E782 Mixed hyperlipidemia: Secondary | ICD-10-CM | POA: Diagnosis not present

## 2023-09-22 DIAGNOSIS — M549 Dorsalgia, unspecified: Secondary | ICD-10-CM | POA: Diagnosis not present

## 2023-09-22 DIAGNOSIS — M47814 Spondylosis without myelopathy or radiculopathy, thoracic region: Secondary | ICD-10-CM | POA: Diagnosis not present

## 2023-09-22 DIAGNOSIS — M47816 Spondylosis without myelopathy or radiculopathy, lumbar region: Secondary | ICD-10-CM | POA: Diagnosis not present

## 2023-09-22 DIAGNOSIS — M7918 Myalgia, other site: Secondary | ICD-10-CM | POA: Diagnosis not present

## 2023-09-22 DIAGNOSIS — M5414 Radiculopathy, thoracic region: Secondary | ICD-10-CM | POA: Diagnosis not present

## 2023-09-25 DIAGNOSIS — Z79899 Other long term (current) drug therapy: Secondary | ICD-10-CM | POA: Diagnosis not present

## 2023-09-25 DIAGNOSIS — E875 Hyperkalemia: Secondary | ICD-10-CM | POA: Diagnosis not present

## 2023-10-01 ENCOUNTER — Telehealth: Payer: Self-pay | Admitting: Cardiovascular Disease

## 2023-10-01 NOTE — Telephone Encounter (Signed)
Husband Gala Romney) stated his wife will need to switch to [facility] and wants to know next steps.

## 2023-10-01 NOTE — Telephone Encounter (Signed)
Spoke with pt husband and he was confused what was wrong with monitor. After looking through patient chart they had called 09/09/2023 and spoke with someone from the device clinic and was told monitor was working fine. The patient was NOT in [facility] or in [facility]. I have apologized to the pt and have put pt in our system now

## 2023-10-06 DIAGNOSIS — M542 Cervicalgia: Secondary | ICD-10-CM | POA: Diagnosis not present

## 2023-10-06 DIAGNOSIS — R531 Weakness: Secondary | ICD-10-CM | POA: Diagnosis not present

## 2023-10-06 DIAGNOSIS — Z7689 Persons encountering health services in other specified circumstances: Secondary | ICD-10-CM | POA: Diagnosis not present

## 2023-10-06 DIAGNOSIS — R200 Anesthesia of skin: Secondary | ICD-10-CM | POA: Diagnosis not present

## 2023-10-06 DIAGNOSIS — R278 Other lack of coordination: Secondary | ICD-10-CM | POA: Diagnosis not present

## 2023-10-06 DIAGNOSIS — R29818 Other symptoms and signs involving the nervous system: Secondary | ICD-10-CM | POA: Diagnosis not present

## 2023-10-06 DIAGNOSIS — Z8673 Personal history of transient ischemic attack (TIA), and cerebral infarction without residual deficits: Secondary | ICD-10-CM | POA: Diagnosis not present

## 2023-10-06 DIAGNOSIS — R262 Difficulty in walking, not elsewhere classified: Secondary | ICD-10-CM | POA: Diagnosis not present

## 2023-10-07 ENCOUNTER — Other Ambulatory Visit: Payer: Self-pay | Admitting: Neurology

## 2023-10-07 DIAGNOSIS — R200 Anesthesia of skin: Secondary | ICD-10-CM

## 2023-10-07 DIAGNOSIS — R531 Weakness: Secondary | ICD-10-CM

## 2023-10-07 DIAGNOSIS — R29818 Other symptoms and signs involving the nervous system: Secondary | ICD-10-CM

## 2023-10-07 DIAGNOSIS — R278 Other lack of coordination: Secondary | ICD-10-CM

## 2023-10-07 DIAGNOSIS — M542 Cervicalgia: Secondary | ICD-10-CM

## 2023-10-07 DIAGNOSIS — R262 Difficulty in walking, not elsewhere classified: Secondary | ICD-10-CM

## 2023-10-07 DIAGNOSIS — Z8673 Personal history of transient ischemic attack (TIA), and cerebral infarction without residual deficits: Secondary | ICD-10-CM

## 2023-10-09 ENCOUNTER — Ambulatory Visit
Admission: RE | Admit: 2023-10-09 | Discharge: 2023-10-09 | Disposition: A | Discharge status: Home / Self Care | Payer: PPO | Source: Ambulatory Visit | Attending: Neurology | Admitting: Neurology

## 2023-10-09 DIAGNOSIS — M542 Cervicalgia: Secondary | ICD-10-CM

## 2023-10-09 DIAGNOSIS — Z8673 Personal history of transient ischemic attack (TIA), and cerebral infarction without residual deficits: Secondary | ICD-10-CM

## 2023-10-09 DIAGNOSIS — I6523 Occlusion and stenosis of bilateral carotid arteries: Secondary | ICD-10-CM | POA: Diagnosis not present

## 2023-10-09 DIAGNOSIS — R278 Other lack of coordination: Secondary | ICD-10-CM

## 2023-10-09 DIAGNOSIS — R29818 Other symptoms and signs involving the nervous system: Secondary | ICD-10-CM

## 2023-10-09 DIAGNOSIS — R531 Weakness: Secondary | ICD-10-CM

## 2023-10-09 DIAGNOSIS — I6381 Other cerebral infarction due to occlusion or stenosis of small artery: Secondary | ICD-10-CM | POA: Diagnosis not present

## 2023-10-09 DIAGNOSIS — R262 Difficulty in walking, not elsewhere classified: Secondary | ICD-10-CM

## 2023-10-09 DIAGNOSIS — R9082 White matter disease, unspecified: Secondary | ICD-10-CM | POA: Diagnosis not present

## 2023-10-09 DIAGNOSIS — I639 Cerebral infarction, unspecified: Secondary | ICD-10-CM | POA: Diagnosis not present

## 2023-10-09 DIAGNOSIS — R200 Anesthesia of skin: Secondary | ICD-10-CM | POA: Insufficient documentation

## 2023-10-12 ENCOUNTER — Encounter: Payer: Self-pay | Admitting: Internal Medicine

## 2023-10-12 ENCOUNTER — Emergency Department: Payer: PPO

## 2023-10-12 ENCOUNTER — Inpatient Hospital Stay
Admission: EM | Admit: 2023-10-12 | Discharge: 2023-10-12 | DRG: 066 | Disposition: A | Discharge status: Home / Self Care | Payer: PPO | Attending: Internal Medicine | Admitting: Internal Medicine

## 2023-10-12 ENCOUNTER — Other Ambulatory Visit: Payer: Self-pay

## 2023-10-12 ENCOUNTER — Other Ambulatory Visit: Payer: Self-pay | Admitting: Nurse Practitioner

## 2023-10-12 DIAGNOSIS — E785 Hyperlipidemia, unspecified: Secondary | ICD-10-CM | POA: Diagnosis present

## 2023-10-12 DIAGNOSIS — Z888 Allergy status to other drugs, medicaments and biological substances status: Secondary | ICD-10-CM

## 2023-10-12 DIAGNOSIS — Z8262 Family history of osteoporosis: Secondary | ICD-10-CM

## 2023-10-12 DIAGNOSIS — Z9851 Tubal ligation status: Secondary | ICD-10-CM

## 2023-10-12 DIAGNOSIS — Z9841 Cataract extraction status, right eye: Secondary | ICD-10-CM | POA: Diagnosis not present

## 2023-10-12 DIAGNOSIS — E1159 Type 2 diabetes mellitus with other circulatory complications: Secondary | ICD-10-CM | POA: Diagnosis not present

## 2023-10-12 DIAGNOSIS — Z79899 Other long term (current) drug therapy: Secondary | ICD-10-CM

## 2023-10-12 DIAGNOSIS — K589 Irritable bowel syndrome without diarrhea: Secondary | ICD-10-CM | POA: Diagnosis present

## 2023-10-12 DIAGNOSIS — R270 Ataxia, unspecified: Secondary | ICD-10-CM | POA: Diagnosis not present

## 2023-10-12 DIAGNOSIS — I10 Essential (primary) hypertension: Secondary | ICD-10-CM | POA: Diagnosis present

## 2023-10-12 DIAGNOSIS — Z8249 Family history of ischemic heart disease and other diseases of the circulatory system: Secondary | ICD-10-CM

## 2023-10-12 DIAGNOSIS — K8681 Exocrine pancreatic insufficiency: Secondary | ICD-10-CM | POA: Diagnosis present

## 2023-10-12 DIAGNOSIS — Z7982 Long term (current) use of aspirin: Secondary | ICD-10-CM | POA: Diagnosis not present

## 2023-10-12 DIAGNOSIS — Z87891 Personal history of nicotine dependence: Secondary | ICD-10-CM

## 2023-10-12 DIAGNOSIS — R4701 Aphasia: Secondary | ICD-10-CM | POA: Diagnosis not present

## 2023-10-12 DIAGNOSIS — Z90710 Acquired absence of both cervix and uterus: Secondary | ICD-10-CM | POA: Diagnosis not present

## 2023-10-12 DIAGNOSIS — Z83719 Family history of colon polyps, unspecified: Secondary | ICD-10-CM

## 2023-10-12 DIAGNOSIS — Z7902 Long term (current) use of antithrombotics/antiplatelets: Secondary | ICD-10-CM

## 2023-10-12 DIAGNOSIS — Z860100 Personal history of colon polyps, unspecified: Secondary | ICD-10-CM | POA: Diagnosis not present

## 2023-10-12 DIAGNOSIS — K760 Fatty (change of) liver, not elsewhere classified: Secondary | ICD-10-CM | POA: Diagnosis not present

## 2023-10-12 DIAGNOSIS — Z833 Family history of diabetes mellitus: Secondary | ICD-10-CM

## 2023-10-12 DIAGNOSIS — Z885 Allergy status to narcotic agent status: Secondary | ICD-10-CM

## 2023-10-12 DIAGNOSIS — Z882 Allergy status to sulfonamides status: Secondary | ICD-10-CM

## 2023-10-12 DIAGNOSIS — R531 Weakness: Secondary | ICD-10-CM | POA: Diagnosis not present

## 2023-10-12 DIAGNOSIS — M810 Age-related osteoporosis without current pathological fracture: Secondary | ICD-10-CM | POA: Diagnosis present

## 2023-10-12 DIAGNOSIS — Z7984 Long term (current) use of oral hypoglycemic drugs: Secondary | ICD-10-CM | POA: Diagnosis not present

## 2023-10-12 DIAGNOSIS — Z85828 Personal history of other malignant neoplasm of skin: Secondary | ICD-10-CM | POA: Diagnosis not present

## 2023-10-12 DIAGNOSIS — I6381 Other cerebral infarction due to occlusion or stenosis of small artery: Principal | ICD-10-CM | POA: Diagnosis present

## 2023-10-12 DIAGNOSIS — K219 Gastro-esophageal reflux disease without esophagitis: Secondary | ICD-10-CM | POA: Diagnosis present

## 2023-10-12 DIAGNOSIS — E119 Type 2 diabetes mellitus without complications: Secondary | ICD-10-CM | POA: Diagnosis present

## 2023-10-12 DIAGNOSIS — Z91048 Other nonmedicinal substance allergy status: Secondary | ICD-10-CM

## 2023-10-12 DIAGNOSIS — K861 Other chronic pancreatitis: Secondary | ICD-10-CM | POA: Diagnosis present

## 2023-10-12 DIAGNOSIS — I611 Nontraumatic intracerebral hemorrhage in hemisphere, cortical: Secondary | ICD-10-CM | POA: Diagnosis not present

## 2023-10-12 DIAGNOSIS — Z794 Long term (current) use of insulin: Secondary | ICD-10-CM | POA: Diagnosis not present

## 2023-10-12 DIAGNOSIS — I639 Cerebral infarction, unspecified: Secondary | ICD-10-CM | POA: Diagnosis not present

## 2023-10-12 LAB — BASIC METABOLIC PANEL
Anion gap: 10 (ref 5–15)
BUN: 18 mg/dL (ref 8–23)
CO2: 25 mmol/L (ref 22–32)
Calcium: 9.7 mg/dL (ref 8.9–10.3)
Chloride: 103 mmol/L (ref 98–111)
Creatinine, Ser: 0.99 mg/dL (ref 0.44–1.00)
GFR, Estimated: 57 mL/min — ABNORMAL LOW (ref >60)
Glucose, Bld: 282 mg/dL — ABNORMAL HIGH (ref 70–99)
Potassium: 3.5 mmol/L (ref 3.5–5.1)
Sodium: 138 mmol/L (ref 135–145)

## 2023-10-12 LAB — CBC
HCT: 45.6 % (ref 36.0–46.0)
Hemoglobin: 15 g/dL (ref 12.0–15.0)
MCH: 31.4 pg (ref 26.0–34.0)
MCHC: 32.9 g/dL (ref 30.0–36.0)
MCV: 95.4 fL (ref 80.0–100.0)
Platelets: 242 10*3/uL (ref 150–400)
RBC: 4.78 MIL/uL (ref 3.87–5.11)
RDW: 13.5 % (ref 11.5–15.5)
WBC: 7.5 10*3/uL (ref 4.0–10.5)
nRBC: 0 % (ref 0.0–0.2)

## 2023-10-12 LAB — APTT: aPTT: 29 s (ref 24–36)

## 2023-10-12 MED ORDER — ACETAMINOPHEN 325 MG RE SUPP: 650.0000 mg | RECTAL | Status: DC | PRN

## 2023-10-12 MED ORDER — ACETAMINOPHEN 160 MG/5ML PO SOLN: 650.0000 mg | ORAL | Status: DC | PRN

## 2023-10-12 MED ORDER — STROKE: EARLY STAGES OF RECOVERY BOOK: Freq: Once | Status: DC

## 2023-10-12 MED ORDER — ENOXAPARIN SODIUM 40 MG/0.4ML IJ SOSY: 40.0000 mg | PREFILLED_SYRINGE | INTRAMUSCULAR | Status: DC

## 2023-10-12 MED ORDER — ACETAMINOPHEN 325 MG PO TABS: 650.0000 mg | ORAL_TABLET | ORAL | Status: DC | PRN

## 2023-10-12 MED ORDER — SENNOSIDES-DOCUSATE SODIUM 8.6-50 MG PO TABS: 1.0000 | ORAL_TABLET | Freq: Every evening | ORAL | Status: DC | PRN

## 2023-10-12 NOTE — ED Notes (Signed)
 Patient discharged from ED by provider. Discharge instructions reviewed with patient and all questions answered. Patient ambulatory from ED in NAD.

## 2023-10-12 NOTE — ED Notes (Signed)
Pt to MRI

## 2023-10-12 NOTE — ED Provider Notes (Signed)
[facility] Provider Note    Event Date/Time   First MD Initiated Contact with Patient 10/12/23 1141     (approximate)   History   Weakness   HPI  Molly [name] is a 82 y.o. female with a history of stroke on Plavix, aspirin who comes in with concerns for new stroke.  Patient was seen by Dr. Malvin Johns and he ordered a CT head.  She had been having some difficulties with balancing, sensory ataxia, and global weakness.  She denies any new symptoms since Friday these of an ongoing and not getting any worse or better.  She does report some intermittent headaches but denies any at this time.  1. New lacunar infarct of the left caudate head. MRI without contrast may be useful further evaluation of acuity. 2. Previous infarcts of the centrum semi ovale. 3. Stable moderate generalized atrophy and white matter disease. This likely reflects the sequela of chronic microvascular ischemia.  Physical Exam   Triage Vital Signs: ED Triage Vitals [10/12/23 1002]  Encounter Vitals Group     BP (!) 194/64     Systolic BP Percentile      Diastolic BP Percentile      Pulse Rate 77     Resp 17     Temp 97.9 F (36.6 C)     Temp Source Oral     SpO2 98 %     Weight      Height      Head Circumference      Peak Flow      Pain Score      Pain Loc      Pain Education      Exclude from Growth Chart     Most recent vital signs: Vitals:   10/12/23 1002  BP: (!) 194/64  Pulse: 77  Resp: 17  Temp: 97.9 F (36.6 C)  SpO2: 98%     General: Awake, no distress.  CV:  Good peripheral perfusion.  Resp:  Normal effort.  Abd:  No distention.  Other:  Left pupil slightly larger then right (baseline since childhood per patient) No obvious cranial nerve stents.  NIH stroke scale of 0   ED Results / Procedures / Treatments   Labs (all labs ordered are listed, but only abnormal results are displayed) Labs Reviewed  BASIC METABOLIC PANEL - Abnormal; Notable for the  following components:      Result Value   Glucose, Bld 282 (*)    GFR, Estimated 57 (*)    All other components within normal limits  CBC  APTT  URINALYSIS, ROUTINE W REFLEX MICROSCOPIC  CBG MONITORING, ED     EKG  My interpretation of EKG:  Normal sinus rhythm 65 without any ST elevation or T wave inversions  RADIOLOGY I have reviewed the CT head personally interpreted and there is a new lacunar infarct PROCEDURES:  Critical Care performed: No  Procedures   MEDICATIONS ORDERED IN ED: Medications - No data to display   IMPRESSION / MDM / ASSESSMENT AND PLAN / ED COURSE  I reviewed the triage vital signs and the nursing notes.   Patient's presentation is most consistent with acute presentation with potential threat to life or bodily function.   Patient comes in with concerns for global weakness, sensory ataxia with CT head confirming new acute stroke.  She is on Plavix, aspirin.  Will discuss the hospital doctor for admission for concern for new stroke.  She has had  CTA imaging recently that did not show any signs of large vessel occlusion or aneurysm.  She does have some asymmetry in her pupils but she reports that is baseline since birth.  Labs ordered evaluate for any electrolyte abnormalities, AKI, UTI.  BMP shows slightly elevated glucose.  CBC normal     FINAL CLINICAL IMPRESSION(S) / ED DIAGNOSES   Final diagnoses:  Weakness  Cerebrovascular accident (CVA), unspecified mechanism (HCC)     Rx / DC Orders   ED Discharge Orders     None        Note:  This document was prepared using Dragon voice recognition software and may include unintentional dictation errors.   Concha Se, MD 10/12/23 [phone]

## 2023-10-12 NOTE — Assessment & Plan Note (Addendum)
 Resume home regimen on discharge.

## 2023-10-12 NOTE — Discharge Summary (Signed)
Physician Discharge Summary   Patient: Molly [name] MRN: [id number] DOB: May 06, 1941  Admit date:     10/12/2023  Discharge date: 10/12/23  Discharge Physician: Verdene Lennert   PCP: Marguarite Arbour, MD   Recommendations at discharge:   Neurology Follow up regarding TEE TEE scheduled for 10/14/2023  Discharge Diagnoses: Active Problems:   Acute CVA (cerebrovascular accident) (HCC)   Type 2 diabetes mellitus (HCC)   Essential hypertension  Resolved Problems:   * No resolved hospital problems. [facility] Course: [name] states that approximately 1 week ago, she had a follow-up visit with her new neurologist, Dr. Malvin Johns.  At that time, she began to experience recurrent right sided weakness and states that it felt like her head was disconnected from her body.  She noted that she would have dizziness especially with positional changes.  She notes that she was still able to ambulate around her home and has not had any falls due to the symptoms.  The symptoms began to improve today and she actually feels much better.  She was told to come to the ED after the CT of her head demonstrated recurrent stroke.  Otherwise she denies any new symptoms including chest pain, palpitations, shortness of breath, nausea, vomiting.   ED course: On arrival to the ED, patient was hypertensive at 194/64 with heart rate of 77.  She was saturating at 98% on room air.  She was afebrile 97.9.  Initial workup notable for normal CBC, and BMP with glucose of 282, creatinine 0.99 with GFR 57.  TRH contacted for admission.  Assessment and Plan:  Acute CVA (cerebrovascular accident) [facility]) Patient has a history of recurrent CVA, most recently on 10/21, where she received TNKase, and then again on 10/28.  Patient developed recurrent ataxia approximately 1 week ago with repeat imaging demonstrating recurrent CVA.  Discussed case with neurology; given strong suspicion for cardioembolic etiology, recommending admission for  TEE.  Cardiology consulted with plans for outpatient TEE on 10/14/2023.  MRI was obtained and showed stable subacute CVA with no acute changes.  - Patient discharged home on home regimen with no additions  Essential hypertension - Restart home regimen given out of time range for permissive hypertension  Type 2 diabetes mellitus (HCC) Resume home regimen on discharge   Consultants: Cardiology, neurology Procedures performed: None Disposition: Home Diet recommendation:  Cardiac and Carb modified diet DISCHARGE MEDICATION: Allergies as of 10/12/2023       Reactions   Alendronate Nausea Only   Codeine Nausea Only   Lansoprazole    REACTION: hurts stomach   Sulfonamide Derivatives    Childhood reaction (doesn't remember)   Adhesive [tape] Rash   Blisters if on too long        Medication List     TAKE these medications    accu-chek softclix lancets 1 each by Other route as needed. Use as instructed   aspirin EC 81 MG tablet Take 1 tablet (81 mg total) by mouth daily. Swallow whole.   Biotin 2500 MCG Caps Take by mouth 2 (two) times daily.   CALCIUM PO Take 750 mg by mouth 2 (two) times daily.   clopidogrel 75 MG tablet Commonly known as: PLAVIX Take 1 tablet (75 mg total) by mouth daily.   glipiZIDE 10 MG 24 hr tablet Commonly known as: GLUCOTROL XL Take 10 mg by mouth 2 (two) times daily.   glucose blood test strip 1 each by Other route as directed. Use as instructed  HYDROmorphone 4 MG tablet Commonly known as: DILAUDID Take 4 mg by mouth every 6 (six) hours as needed for moderate pain (pain score 4-6).   ketoconazole 2 % cream Commonly known as: NIZORAL Apply to skin folds once daily, twice daily with flares. What changed:  how much to take how to take this when to take this reasons to take this additional instructions   Lantus SoloStar 100 UNIT/ML Solostar Pen Generic drug: insulin glargine Inject 35 Units into the skin daily.   lisinopril  20 MG tablet Commonly known as: ZESTRIL Take 20 mg by mouth daily.   magnesium oxide 400 MG tablet Commonly known as: MAG-OX Take 1 tablet by mouth daily.   metFORMIN 500 MG 24 hr tablet Commonly known as: GLUCOPHAGE-XR Take 1,000 mg by mouth 2 (two) times daily.   metoprolol tartrate 25 MG tablet Commonly known as: LOPRESSOR Take 1 tablet (25 mg total) by mouth 2 (two) times daily.   pantoprazole 40 MG tablet Commonly known as: PROTONIX Take 40 mg by mouth 2 (two) times daily.   rosuvastatin 40 MG tablet Commonly known as: CRESTOR Take 1 tablet (40 mg total) by mouth daily.   VITAMIN B 12 PO Take 1 tablet by mouth daily.   VITAMIN D PO Take 500 Units by mouth 2 (two) times daily.        Follow-up Information     [facility] Follow up on 10/14/2023.   Why: 6:30 AM for transesophageal echocardiogram.  Pls present to patient registration. NOTHING TO EAT AFTER MIDNIGHT. Contact information: [address] [place] [address]        Morene Crocker, MD. Schedule an appointment as soon as possible for a visit in 1 week(s).   Specialty: Neurology Contact information: [phone] [facility] [address] [facility] [place] [address] [phone]                Discharge Exam: Ceasar Mons Weights   10/12/23 1408  Weight: 75.3 kg   Physical Exam Vitals and nursing note reviewed.  Constitutional:      General: She is not in acute distress.    Appearance: She is normal weight.  Eyes:     Conjunctiva/sclera: Conjunctivae normal.     Pupils: Pupils are equal, round, and reactive to light.  Pulmonary:     Effort: Pulmonary effort is normal. No respiratory distress.  Skin:    General: Skin is warm and dry.  Neurological:     Mental Status: She is alert.  Psychiatric:        Mood and Affect: Mood normal.        Behavior: Behavior normal.     Condition at discharge: good  The results of significant  diagnostics from this hospitalization (including imaging, microbiology, ancillary and laboratory) are listed below for reference.   Imaging Studies: MR BRAIN WO CONTRAST  Result Date: 10/12/2023 CLINICAL DATA:  Weakness, stroke-like symptoms EXAM: MRI HEAD WITHOUT CONTRAST TECHNIQUE: Multiplanar, multiecho pulse sequences of the brain and surrounding structures were obtained without intravenous contrast. COMPARISON:  09/07/2023 MRI head FINDINGS: Brain: Further diminishment in increased signal on diffusion-weighted imaging in the left caudate tail and left frontal lobe, with resolution of the previously noted ADC correlates, compatible with subacute infarcts. No evidence of additional acute or subacute infarct that was not present on the prior exam. No acute hemorrhage, mass, mass effect, or midline shift. No hydrocephalus or extra-axial collection. Craniocervical junction within normal limits. Redemonstrated petechial hemorrhage  in the superior left medial frontal lobe. No new hemosiderin deposition. T2 hyperintense signal in the periventricular white matter, likely the sequela of chronic small vessel ischemic disease. Vascular: Normal arterial flow voids. Skull and upper cervical spine: Normal marrow signal. Sinuses/Orbits: Clear paranasal sinuses. No acute finding in the orbits. Status post bilateral lens replacements. Other: The mastoid air cells are well aerated. IMPRESSION: 1. Further diminishment in diffusion restriction in the left caudate tail and left frontal lobe, with resolution of the previously noted ADC correlates, compatible with subacute infarcts. 2. No evidence of additional acute or subacute infarct that was not present on the prior exam. 3. Redemonstrated petechial hemorrhage in the superior left medial frontal lobe. No new hemosiderin deposition. Electronically Signed   By: Wiliam Ke M.D.   On: 10/12/2023 14:13   CT HEAD WO CONTRAST ( )  Result Date: 10/09/2023 CLINICAL DATA:   History of CVA. New onset of numbness and weakness. Difficulty walking abnormal balance. EXAM: CT HEAD WITHOUT CONTRAST TECHNIQUE: Contiguous axial images were obtained from the base of the skull through the vertex without intravenous contrast. RADIATION DOSE REDUCTION: This exam was performed according to the departmental dose-optimization program which includes automated exposure control, adjustment of the mA and/or kV according to patient size and/or use of iterative reconstruction technique. COMPARISON:  MR head 09/07/2023 FINDINGS: Brain: Previous infarcts of the centrum semi ovale are noted. Generalized scratched at moderate generalized atrophy and white matter disease is stable. A lacunar infarct of the left caudate head is new since prior exam. No other acute cortical infarct is present. The ventricles are of normal size. Deep brain nuclei are otherwise within normal limits. The brainstem and cerebellum are within normal limits. Midline structures are within normal limits. Vascular: Atherosclerotic calcifications are present within the cavernous internal carotid arteries bilaterally and at dural margin of the right vertebral artery. No hyperdense vessel is present. Skull: Calvarium is intact. No focal lytic or blastic lesions are present. No significant extracranial soft tissue lesion is present. Sinuses/Orbits: The paranasal sinuses and mastoid air cells are clear. Bilateral lens replacements are noted. Globes and orbits are otherwise unremarkable. IMPRESSION: 1. New lacunar infarct of the left caudate head. MRI without contrast may be useful further evaluation of acuity. 2. Previous infarcts of the centrum semi ovale. 3. Stable moderate generalized atrophy and white matter disease. This likely reflects the sequela of chronic microvascular ischemia. These results will be called to the ordering clinician or representative by the Radiologist Assistant, and communication documented in the PACS or [employer]. Electronically Signed   By: Marin Roberts M.D.   On: 10/09/2023 15:16    Microbiology: Results for orders placed or performed during the hospital encounter of 01/25/21  Urine culture     Status: Abnormal   Collection Time: 01/25/21 12:31 AM   Specimen: In/Out Cath Urine  Result Value Ref Range Status   Specimen Description   Final    IN/OUT CATH URINE Performed at [facility], [address], [place], [place] [address]    Special Requests   Final    NONE Performed at [facility], [address] [place], [place] [address]    Culture MULTIPLE SPECIES PRESENT, SUGGEST RECOLLECTION (A)  Final   Report Status 01/26/2021 FINAL  Final  Resp Panel by RT-PCR (Flu A&B, Covid) Nasopharyngeal Swab     Status: None   Collection Time: 01/25/21  7:38 AM   Specimen: Nasopharyngeal Swab; Nasopharyngeal(NP) swabs in vial transport medium  Result Value Ref Range Status   SARS Coronavirus 2 by RT PCR NEGATIVE NEGATIVE Final    Comment: (NOTE) SARS-CoV-2 target nucleic acids are NOT DETECTED.  The SARS-CoV-2 RNA is generally detectable in upper respiratory specimens during the acute phase of infection. The lowest concentration of SARS-CoV-2 viral copies this assay can detect is 138 copies/mL. A negative result does not preclude SARS-Cov-2 infection and should not be used as the sole basis for treatment or other patient management decisions. A negative result may occur with  improper specimen collection/handling, submission of specimen other than nasopharyngeal swab, presence of viral mutation(s) within the areas targeted by this assay, and inadequate number of viral copies(<138 copies/mL). A negative result must be combined with clinical observations, patient history, and epidemiological information. The expected result is Negative.  Fact Sheet for Patients:  BloggerCourse.com  Fact Sheet for Healthcare Providers:   SeriousBroker.it  This test is no t yet approved or cleared by the [place] FDA and  has been authorized for detection and/or diagnosis of SARS-CoV-2 by FDA under an Emergency Use Authorization (EUA). This EUA will remain  in effect (meaning this test can be used) for the duration of the COVID-19 declaration under Section 564(b)(1) of the Act, 21 U.S.C.section 360bbb-3(b)(1), unless the authorization is terminated  or revoked sooner.       Influenza A by PCR NEGATIVE NEGATIVE Final   Influenza B by PCR NEGATIVE NEGATIVE Final    Comment: (NOTE) The Xpert Xpress SARS-CoV-2/FLU/RSV plus assay is intended as an aid in the diagnosis of influenza from Nasopharyngeal swab specimens and should not be used as a sole basis for treatment. Nasal washings and aspirates are unacceptable for Xpert Xpress SARS-CoV-2/FLU/RSV testing.  Fact Sheet for Patients: BloggerCourse.com  Fact Sheet for Healthcare Providers: SeriousBroker.it  This test is not yet approved or cleared by the [place] FDA and has been authorized for detection and/or diagnosis of SARS-CoV-2 by FDA under an Emergency Use Authorization (EUA). This EUA will remain in effect (meaning this test can be used) for the duration of the COVID-19 declaration under Section 564(b)(1) of the Act, 21 U.S.C. section 360bbb-3(b)(1), unless the authorization is terminated or revoked.  Performed at [facility], [address] [place], [place] [address]     Labs: CBC: Recent Labs  Lab 10/12/23 1004  WBC 7.5  HGB 15.0  HCT 45.6  MCV 95.4  PLT 242   Basic Metabolic Panel: Recent Labs  Lab 10/12/23 1004  NA 138  K 3.5  CL 103  CO2 25  GLUCOSE 282*  BUN 18  CREATININE 0.99  CALCIUM 9.7   Liver Function Tests: No results for input(s): "AST", "ALT", "ALKPHOS", "BILITOT", "PROT", "ALBUMIN" in the last 168 hours. CBG: No results  for input(s): "GLUCAP" in the last 168 hours.  Discharge time spent: less than 30 minutes.  Signed: Verdene Lennert, MD [facility] 10/12/2023

## 2023-10-12 NOTE — H&P (Signed)
History and Physical    Patient: Molly [name] MVH:[id number] DOB: 12/12/1940 DOA: 10/12/2023 DOS: the patient was seen and examined on 10/12/2023 PCP: Marguarite Arbour, MD  Patient coming from: Home  Chief Complaint:  Chief Complaint  Patient presents with   Weakness   HPI: Molly [name] is a 82 y.o. female with medical history significant of recurrent ischemic CVA s/p TNK and thrombectomy, type 2 diabetes, hypertension, hyperlipidemia, recurrent pancreatitis, who presents to the ED due to weakness.  Molly [name] states that approximately 1 week ago, she had a follow-up visit with her new neurologist, Dr. Malvin Johns.  At that time, she began to experience recurrent right sided weakness and states that it felt like her head was disconnected from her body.  She noted that she would have dizziness especially with positional changes.  She notes that she was still able to ambulate around her home and has not had any falls due to the symptoms.  The symptoms began to improve today and she actually feels much better.  She was told to come to the ED after the CT of her head demonstrated recurrent stroke.  Otherwise she denies any new symptoms including chest pain, palpitations, shortness of breath, nausea, vomiting.  ED course: On arrival to the ED, patient was hypertensive at 194/64 with heart rate of 77.  She was saturating at 98% on room air.  She was afebrile 97.9.  Initial workup notable for normal CBC, and BMP with glucose of 282, creatinine 0.99 with GFR 57.  TRH contacted for admission.  Review of Systems: As mentioned in the history of present illness. All other systems reviewed and are negative.  Past Medical History:  Diagnosis Date   Arrhythmia    Arthritis    Basal cell carcinoma    right neck, right forehead - treated in the past   Cancer (HCC)    skin   CMV (cytomegalovirus infection) (HCC)    Colon polyps    Diabetes mellitus, type 2 (HCC)    Duodenitis    Dysrhythmia    Fatty liver  disease, nonalcoholic    FH: colonic polyps    hx of-adenomatous   GERD (gastroesophageal reflux disease)    Heart murmur    dx'd in 1960s. followed by PCP   Hemorrhoid .   Hyperlipidemia    Hypertension    IBS (irritable bowel syndrome)    Osteoporosis    Pancreatitis    drug induced   Past Surgical History:  Procedure Laterality Date   ABDOMINAL HYSTERECTOMY     Carotids  3/08   mild only   CATARACT EXTRACTION W/PHACO Right 06/29/2017   Procedure: CATARACT EXTRACTION PHACO AND INTRAOCULAR LENS PLACEMENT (IOC)  Toric Diabetic Right;  Surgeon: Nevada Crane, MD;  Location: [facility];  Service: Ophthalmology;  Laterality: Right;  diabetic - oral meds   CATARACT EXTRACTION W/PHACO Right 07/27/2017   Procedure: CATARACT EXTRACTION PHACO AND INTRAOCULAR LENS PLACEMENT (IOC) LEFT DIABETIC TORIC;  Surgeon: Nevada Crane, MD;  Location: [facility];  Service: Ophthalmology;  Laterality: Right;  Diabetic - oral meds   COLONOSCOPY     COLONOSCOPY WITH PROPOFOL N/A 07/20/2019   Procedure: COLONOSCOPY WITH PROPOFOL;  Surgeon: Toledo, Boykin Nearing, MD;  Location: [facility];  Service: Gastroenterology;  Laterality: N/A;   cystocele, cystourethropexy  12/87   dexa     increase T - 3.1 spine, normal femur 1/04   ESOPHAGOGASTRODUODENOSCOPY     ESOPHAGOGASTRODUODENOSCOPY (EGD) WITH PROPOFOL  N/A 04/16/2018   Procedure: ESOPHAGOGASTRODUODENOSCOPY (EGD) WITH PROPOFOL;  Surgeon: Scot Jun, MD;  Location: [facility];  Service: Endoscopy;  Laterality: N/A;   ESOPHAGOGASTRODUODENOSCOPY (EGD) WITH PROPOFOL N/A 07/20/2019   Procedure: ESOPHAGOGASTRODUODENOSCOPY (EGD) WITH PROPOFOL;  Surgeon: Toledo, Boykin Nearing, MD;  Location: [facility];  Service: Gastroenterology;  Laterality: N/A;   EYE SURGERY     hysterectomy (other)  1980   IR CT HEAD LTD  08/31/2023   IR PERCUTANEOUS ART THROMBECTOMY/INFUSION INTRACRANIAL INC DIAG ANGIO  08/31/2023   IR US GUIDE VASC ACCESS  RIGHT  08/31/2023   LOOP RECORDER INSERTION Left 09/09/2023   Procedure: LOOP RECORDER INSERTION;  Surgeon: Lanier Prude, MD;  Location: [facility];  Service: Cardiovascular;  Laterality: Left;   RADIOLOGY WITH ANESTHESIA N/A 08/31/2023   Procedure: IR WITH ANESTHESIA;  Surgeon: Radiologist, Medication, MD;  Location: [facility] OR;  Service: Radiology;  Laterality: N/A;   TUBAL LIGATION  1978   vaginal deliveries     x2   Social History:  reports that she quit smoking about 32 years ago. Her smoking use included cigarettes. She started smoking about 52 years ago. She has never used smokeless tobacco. She reports that she does not currently use alcohol. She reports that she does not use drugs.  Allergies  Allergen Reactions   Alendronate Nausea Only   Codeine Nausea Only   Lansoprazole     REACTION: hurts stomach   Sulfonamide Derivatives     Childhood reaction (doesn't remember)   Adhesive [Tape] Rash    Blisters if on too long    Family History  Problem Relation Age of Onset   Heart failure Mother    Coronary artery disease Mother    Heart attack Mother 61   Diabetes Mother    Heart failure Father    Coronary artery disease Father    Breast cancer Neg Hx     Prior to Admission medications   Medication Sig Start Date End Date Taking? Authorizing Provider  aspirin EC 81 MG tablet Take 1 tablet (81 mg total) by mouth daily. Swallow whole. 09/07/23   Mathews Argyle, NP  Biotin 2500 MCG CAPS Take by mouth 2 (two) times daily.    [provider]  CALCIUM PO Take 750 mg by mouth 2 (two) times daily.    [provider]  Cholecalciferol (VITAMIN D PO) Take 500 Units by mouth 2 (two) times daily.    [provider]  clopidogrel (PLAVIX) 75 MG tablet Take 1 tablet (75 mg total) by mouth daily. 09/10/23 11/09/23  Regalado, Belkys A, MD  Cyanocobalamin (VITAMIN B 12 PO) Take 1 tablet by mouth daily.    [provider]  glipiZIDE (GLUCOTROL  XL) 10 MG 24 hr tablet Take 10 mg by mouth 2 (two) times daily. 07/25/19   [provider]  glucose blood test strip 1 each by Other route as directed. Use as instructed    [provider]  HYDROmorphone (DILAUDID) 4 MG tablet Take 4 mg by mouth every 6 (six) hours as needed for moderate pain (pain score 4-6). 08/22/20   [provider]  insulin glargine (LANTUS SOLOSTAR) 100 UNIT/ML Solostar Pen Inject 35 Units into the skin daily. 09/09/23   Regalado, Belkys A, MD  ketoconazole (NIZORAL) 2 % cream Apply to skin folds once daily, twice daily with flares. Patient taking differently: Apply 1 Application topically daily as needed for irritation. 02/04/23   Willeen Niece, MD  Lancet Devices (  ACCU-CHEK SOFTCLIX) lancets 1 each by Other route as needed. Use as instructed    [provider]  lisinopril (ZESTRIL) 20 MG tablet Take 20 mg by mouth daily.    [provider]  magnesium oxide (MAG-OX) 400 MG tablet Take 1 tablet by mouth daily. 01/02/21   [provider]  metFORMIN (GLUCOPHAGE-XR) 500 MG 24 hr tablet Take 1,000 mg by mouth 2 (two) times daily. 02/25/18   [provider]  metoprolol tartrate (LOPRESSOR) 25 MG tablet Take 1 tablet (25 mg total) by mouth 2 (two) times daily. 09/09/23   Regalado, Belkys A, MD  pantoprazole (PROTONIX) 40 MG tablet Take 40 mg by mouth 2 (two) times daily. 01/08/21   [provider]  rosuvastatin (CRESTOR) 40 MG tablet Take 1 tablet (40 mg total) by mouth daily. 09/10/23   Regalado, Prentiss Bells, MD    Physical Exam: Vitals:   10/12/23 1002  BP: (!) 194/64  Pulse: 77  Resp: 17  Temp: 97.9 F (36.6 C)  TempSrc: Oral  SpO2: 98%   Physical Exam Vitals and nursing note reviewed.  Constitutional:      General: She is not in acute distress.    Appearance: She is normal weight. She is not toxic-appearing.  HENT:     Head: Normocephalic and atraumatic.     Mouth/Throat:     Mouth: Mucous membranes  are moist.     Pharynx: Oropharynx is clear.  Eyes:     Conjunctiva/sclera: Conjunctivae normal.  Cardiovascular:     Rate and Rhythm: Normal rate and regular rhythm.     Heart sounds: Murmur heard.  Pulmonary:     Effort: Pulmonary effort is normal. No respiratory distress.     Breath sounds: Normal breath sounds. No wheezing, rhonchi or rales.  Musculoskeletal:     Right lower leg: No edema.     Left lower leg: No edema.     Comments: Mild left ankle swelling, non pitting (chronic per patient)  Skin:    General: Skin is warm and dry.  Neurological:     Mental Status: She is alert.     Comments:  Patient is alert and oriented x 3 No facial asymmetry or dysarthria Sensation grossly intact throughout 5/5 strength throughout Normal finger-to-nose bilaterally  Psychiatric:        Mood and Affect: Mood normal.        Behavior: Behavior normal.    Data Reviewed: CBC with WBC of 7.5, hemoglobin of 15.0, platelets of 242 BMP with sodium of 138, potassium 3.5, bicarb 25, glucose 282, BUN 18, creatinine 0.99 with GFR 57  EKG personally reviewed.  Sinus rhythm with rate of 72.  Some motion artifact.  Repeat EKG with sinus rhythm with rate of 65.  No ST or T wave changes concerning for acute ischemia.  CT head obtained on 11/29 IMPRESSION: 1. New lacunar infarct of the left caudate head. MRI without contrast may be useful further evaluation of acuity. 2. Previous infarcts of the centrum semi ovale. 3. Stable moderate generalized atrophy and white matter disease. This likely reflects the sequela of chronic microvascular ischemia.  Results are pending, will review when available.  Some  Assessment and Plan:  Acute CVA (cerebrovascular accident) [facility]) Patient has a history of recurrent CVA, most recently on 10/21, where she received TNKase, and then again on 10/28.  Patient developed recurrent ataxia approximately 1 week ago with repeat imaging demonstrating recurrent CVA.   Discussed case with neurology; given strong  suspicion for cardioembolic etiology, recommending admission for TEE.  - Neurology consulted; appreciate their recommendations - Cardiology consulted regarding TEE and loop recorder interrogation; appreciate their recommendations - Telemetry monitoring - MRI brain without contrast pending - No indication for permissive hypertension - Continue DAPT and statin  Essential hypertension - Restart home regimen given out of time range for permissive hypertension  Type 2 diabetes mellitus (HCC) - Hold home glipizide, metformin, Lantus - SSI, moderate - Semglee 15 units at bedtime  Advance Care Planning:   Code Status: Full Code   Consults: Neurology, cardiology  Family Communication: Patient's husband updated at bedside  Severity of Illness: The appropriate patient status for this patient is INPATIENT. Inpatient status is judged to be reasonable and necessary in order to provide the required intensity of service to ensure the patient's safety. The patient's presenting symptoms, physical exam findings, and initial radiographic and laboratory data in the context of their chronic comorbidities is felt to place them at high risk for further clinical deterioration. Furthermore, it is not anticipated that the patient will be medically stable for discharge from the hospital within 2 midnights of admission.   * I certify that at the point of admission it is my clinical judgment that the patient will require inpatient hospital care spanning beyond 2 midnights from the point of admission due to high intensity of service, high risk for further deterioration and high frequency of surveillance required.*  Author: Verdene Lennert, MD 10/12/2023 1:42 PM  For on call review [email or web address].

## 2023-10-12 NOTE — Progress Notes (Signed)
SLP Cancellation Note  Patient Details Name: Molly [name] MRN: [id number] DOB: [birth year]   Cancelled treatment:       Reason Eval/Treat Not Completed:  (chart reviewed)  Pt admitted only ~2 hours ago.  Bed is in the Hallway currently.  ST will hold on cog-language evaluation at this time until pt is in a formal room, and post 24 hours.      Jerilynn Som, MS, CCC-SLP Speech Language Pathologist Rehab Services; [facility] [phone] (ascom) Molly [name] 10/12/2023, 2:33 PM

## 2023-10-12 NOTE — Progress Notes (Addendum)
History & Physical - for TEE 10/14/2023    Patient ID: JURIDIA NAMBO MRN: [id number], DOB/AGE: [id number]   Admit date: 10/12/2023   Primary Physician: Marguarite Arbour, MD Primary Cardiologist: Lanier Prude, MD  Patient Profile    82 year old female with a history of hypertension, hyperlipidemia, type 2 diabetes mellitus, nonalcoholic fatty liver disease, and recurrent stroke status post loop recorder, who presented to the emergency department December 2, w/ recurrent R sided wkns.  We've been asked to perform outpt TEE.  Past Medical History    Past Medical History:  Diagnosis Date   Arrhythmia    Arthritis    Basal cell carcinoma    right neck, right forehead - treated in the past   Cancer (HCC)    skin   CMV (cytomegalovirus infection) (HCC)    Colon polyps    Diabetes mellitus, type 2 (HCC)    Duodenitis    Dysrhythmia    Fatty liver disease, nonalcoholic    FH: colonic polyps    hx of-adenomatous   GERD (gastroesophageal reflux disease)    Heart murmur    dx'd in 1960s. followed by PCP   Hemorrhoid .   Hyperlipidemia    Hypertension    IBS (irritable bowel syndrome)    Osteoporosis    Pancreatitis    drug induced    Past Surgical History:  Procedure Laterality Date   ABDOMINAL HYSTERECTOMY     Carotids  3/08   mild only   CATARACT EXTRACTION W/PHACO Right 06/29/2017   Procedure: CATARACT EXTRACTION PHACO AND INTRAOCULAR LENS PLACEMENT (IOC)  Toric Diabetic Right;  Surgeon: Nevada Crane, MD;  Location: [facility];  Service: Ophthalmology;  Laterality: Right;  diabetic - oral meds   CATARACT EXTRACTION W/PHACO Right 07/27/2017   Procedure: CATARACT EXTRACTION PHACO AND INTRAOCULAR LENS PLACEMENT (IOC) LEFT DIABETIC TORIC;  Surgeon: Nevada Crane, MD;  Location: [facility];  Service: Ophthalmology;  Laterality: Right;  Diabetic - oral meds   COLONOSCOPY     COLONOSCOPY WITH PROPOFOL N/A 07/20/2019   Procedure: COLONOSCOPY WITH  PROPOFOL;  Surgeon: Toledo, Boykin Nearing, MD;  Location: [facility];  Service: Gastroenterology;  Laterality: N/A;   cystocele, cystourethropexy  12/87   dexa     increase T - 3.1 spine, normal femur 1/04   ESOPHAGOGASTRODUODENOSCOPY     ESOPHAGOGASTRODUODENOSCOPY (EGD) WITH PROPOFOL N/A 04/16/2018   Procedure: ESOPHAGOGASTRODUODENOSCOPY (EGD) WITH PROPOFOL;  Surgeon: Scot Jun, MD;  Location: [facility];  Service: Endoscopy;  Laterality: N/A;   ESOPHAGOGASTRODUODENOSCOPY (EGD) WITH PROPOFOL N/A 07/20/2019   Procedure: ESOPHAGOGASTRODUODENOSCOPY (EGD) WITH PROPOFOL;  Surgeon: Toledo, Boykin Nearing, MD;  Location: [facility];  Service: Gastroenterology;  Laterality: N/A;   EYE SURGERY     hysterectomy (other)  1980   IR CT HEAD LTD  08/31/2023   IR PERCUTANEOUS ART THROMBECTOMY/INFUSION INTRACRANIAL INC DIAG ANGIO  08/31/2023   IR US GUIDE VASC ACCESS RIGHT  08/31/2023   LOOP RECORDER INSERTION Left 09/09/2023   Procedure: LOOP RECORDER INSERTION;  Surgeon: Lanier Prude, MD;  Location: [facility];  Service: Cardiovascular;  Laterality: Left;   RADIOLOGY WITH ANESTHESIA N/A 08/31/2023   Procedure: IR WITH ANESTHESIA;  Surgeon: Radiologist, Medication, MD;  Location: [facility] OR;  Service: Radiology;  Laterality: N/A;   TUBAL LIGATION  1978   vaginal deliveries     x2     Allergies  Allergies  Allergen Reactions   Alendronate Nausea Only   Codeine  Nausea Only   Lansoprazole     REACTION: hurts stomach   Sulfonamide Derivatives     Childhood reaction (doesn't remember)   Adhesive [Tape] Rash    Blisters if on too long    History of Present Illness    82 year old female with a history of hypertension, hyperlipidemia, type 2 diabetes mellitus, nonalcoholic fatty liver disease, and recurrent stroke.  She was admitted October 21 with recurrent CVA was treated with TNK and thrombectomy for left MCA occlusion.  Symptoms resolved and she was subsequently discharged but  had recurrent aphasia prompting repeat presentation October 28.  MRI at that time showed new focus of acute ischemia.  Echo showed an EF of 60 to 65% with moderate MR.  Bubble study was negative.  She was seen by our team and a loop recorder was placed.  EEG was negative for seizures.  She was placed on dual antiplatelet therapy.  Ms. Hempstead presented to the emergency department on December 2 in the setting of difficulty balancing, sensory ataxia, and global weakness, over the past week.  Her neurologist ordered a head CT, which was performed on November 29, and showed a new lacunar infarct of the left caudate head.  She was contacted and advised to present to the emergency department.  She was hypertensive on arrival at 194/64.  Her loop recorder was interrogated and showed no evidence of atrial fibrillation or other arrhythmias.  A TEE was requested and has been arranged to be performed on December 4 as an outpatient, the patient was discharged home from the ED.  Home Medications    Prior to Admission medications   Medication Sig Start Date End Date Taking? Authorizing Provider  aspirin EC 81 MG tablet Take 1 tablet (81 mg total) by mouth daily. Swallow whole. 09/07/23   Mathews Argyle, NP  Biotin 2500 MCG CAPS Take by mouth 2 (two) times daily.    [provider]  CALCIUM PO Take 750 mg by mouth 2 (two) times daily.    [provider]  Cholecalciferol (VITAMIN D PO) Take 500 Units by mouth 2 (two) times daily.    [provider]  clopidogrel (PLAVIX) 75 MG tablet Take 1 tablet (75 mg total) by mouth daily. 09/10/23 11/09/23  Regalado, Belkys A, MD  Cyanocobalamin (VITAMIN B 12 PO) Take 1 tablet by mouth daily.    [provider]  glipiZIDE (GLUCOTROL XL) 10 MG 24 hr tablet Take 10 mg by mouth 2 (two) times daily. 07/25/19   [provider]  glucose blood test strip 1 each by Other route as directed. Use as instructed    [provider]   HYDROmorphone (DILAUDID) 4 MG tablet Take 4 mg by mouth every 6 (six) hours as needed for moderate pain (pain score 4-6). 08/22/20   [provider]  insulin glargine (LANTUS SOLOSTAR) 100 UNIT/ML Solostar Pen Inject 35 Units into the skin daily. 09/09/23   Regalado, Belkys A, MD  ketoconazole (NIZORAL) 2 % cream Apply to skin folds once daily, twice daily with flares. Patient taking differently: Apply 1 Application topically daily as needed for irritation. 02/04/23   Willeen Niece, MD  Lancet Devices [facility]) lancets 1 each by Other route as needed. Use as instructed    [provider]  lisinopril (ZESTRIL) 20 MG tablet Take 20 mg by mouth daily.    [provider]  magnesium oxide (MAG-OX) 400 MG tablet Take 1 tablet by mouth daily. 01/02/21   [provider]  metFORMIN (GLUCOPHAGE-XR) 500 MG 24 hr tablet Take 1,000 mg by mouth 2 (two) times daily. 02/25/18   [provider]  metoprolol tartrate (LOPRESSOR) 25 MG tablet Take 1 tablet (25 mg total) by mouth 2 (two) times daily. 09/09/23   Regalado, Belkys A, MD  pantoprazole (PROTONIX) 40 MG tablet Take 40 mg by mouth 2 (two) times daily. 01/08/21   [provider]  rosuvastatin (CRESTOR) 40 MG tablet Take 1 tablet (40 mg total) by mouth daily. 09/10/23   Regalado, Prentiss Bells, MD    Family History    Family History  Problem Relation Age of Onset   Heart failure Mother    Coronary artery disease Mother    Heart attack Mother 54   Diabetes Mother    Heart failure Father    Coronary artery disease Father    Breast cancer Neg Hx    She indicated that her mother is deceased. She indicated that her father is deceased. She indicated that the status of her neg hx is unknown.   Social History    Social History   Socioeconomic History   Marital status: Married    Spouse name: Not on file   Number of children: Not on file   Years of education: Not on file   Highest education  level: Not on file  Occupational History   Occupation: retired  Tobacco Use   Smoking status: Former    Current packs/day: 0.00    Types: Cigarettes    Start date: 1972    Quit date: 1992    Years since quitting: 32.9   Smokeless tobacco: Never  Vaping Use   Vaping status: Never Used  Substance and Sexual Activity   Alcohol use: Not Currently    Comment: Occ -1x/mo   Drug use: No   Sexual activity: Not on file  Other Topics Concern   Not on file  Social History Narrative   Not on file   Social Determinants of Health   Financial Resource Strain: Not on file  Food Insecurity: No Food Insecurity (09/07/2023)   Hunger Vital Sign    Worried About Running Out of Food in the Last Year: Never true    Ran Out of Food in the Last Year: Never true  Transportation Needs: No Transportation Needs (09/07/2023)   PRAPARE - Administrator, Civil Service (Medical): No    Lack of Transportation (Non-Medical): No  Physical Activity: Not on file  Stress: Not on file  Social Connections: Not on file  Intimate Partner Violence: Not At Risk (09/07/2023)   Humiliation, Afraid, Rape, and Kick questionnaire    Fear of Current or Ex-Partner: No    Emotionally Abused: No    Physically Abused: No    Sexually Abused: No     Review of Systems    General:  No chills, fever, night sweats or weight changes.  Cardiovascular:  No chest pain, dyspnea on exertion, edema, orthopnea, palpitations, paroxysmal nocturnal dyspnea. Dermatological: No rash, lesions/masses Respiratory: No cough, dyspnea Urologic: No hematuria, dysuria Abdominal:   No nausea, vomiting, diarrhea, bright red blood per rectum, melena, or hematemesis Neurologic:  +++ difficulty balancing, sensory ataxia, and global weakness.  Some slurring of speech. No visual changes, changes in mental status. All other systems reviewed and are otherwise negative except as noted above.  Physical Exam    Blood pressure (!) 194/64,  pulse 77, temperature 98 F (36.7 C), temperature source Oral, resp. rate 17,  height 5\' 5"  (1.651 m), weight 75.3 kg, SpO2 98%.  General: Pleasant, NAD Psych: Normal affect. Neuro: Alert and oriented X 3. Moves all extremities spontaneously. HEENT: Normal  Neck: Supple without bruits or JVD. Lungs:  Resp regular and unlabored, CTA. Heart: RRR no s3, s4, or murmurs. Abdomen: Soft, non-tender, non-distended, BS + x 4.  Extremities: No clubbing, cyanosis or edema. DP/PT 2+, Radials 2+ and equal bilaterally.  Labs      Lab Results  Component Value Date   WBC 7.5 10/12/2023   HGB 15.0 10/12/2023   HCT 45.6 10/12/2023   MCV 95.4 10/12/2023   PLT 242 10/12/2023    Recent Labs  Lab 10/12/23 1004  NA 138  K 3.5  CL 103  CO2 25  BUN 18  CREATININE 0.99  CALCIUM 9.7  GLUCOSE 282*   Lab Results  Component Value Date   CHOL 146 09/07/2023   HDL 53 09/07/2023   LDLCALC 58 09/07/2023   TRIG 175 (H) 09/07/2023   Lab Results  Component Value Date   DDIMER 0.51 (H) 09/07/2023      Radiology Studies    MR BRAIN WO CONTRAST  Result Date: 10/12/2023 CLINICAL DATA:  Weakness, stroke-like symptoms EXAM: MRI HEAD WITHOUT CONTRAST TECHNIQUE: Multiplanar, multiecho pulse sequences of the brain and surrounding structures were obtained without intravenous contrast. COMPARISON:  09/07/2023 MRI head FINDINGS: Brain: Further diminishment in increased signal on diffusion-weighted imaging in the left caudate tail and left frontal lobe, with resolution of the previously noted ADC correlates, compatible with subacute infarcts. No evidence of additional acute or subacute infarct that was not present on the prior exam. No acute hemorrhage, mass, mass effect, or midline shift. No hydrocephalus or extra-axial collection. Craniocervical junction within normal limits. Redemonstrated petechial hemorrhage in the superior left medial frontal lobe. No new hemosiderin deposition. T2 hyperintense signal in the  periventricular white matter, likely the sequela of chronic small vessel ischemic disease. Vascular: Normal arterial flow voids. Skull and upper cervical spine: Normal marrow signal. Sinuses/Orbits: Clear paranasal sinuses. No acute finding in the orbits. Status post bilateral lens replacements. Other: The mastoid air cells are well aerated. IMPRESSION: 1. Further diminishment in diffusion restriction in the left caudate tail and left frontal lobe, with resolution of the previously noted ADC correlates, compatible with subacute infarcts. 2. No evidence of additional acute or subacute infarct that was not present on the prior exam. 3. Redemonstrated petechial hemorrhage in the superior left medial frontal lobe. No new hemosiderin deposition. Electronically Signed   By: Wiliam Ke M.D.   On: 10/12/2023 14:13   CT HEAD WO CONTRAST ( )  Result Date: 10/09/2023 CLINICAL DATA:  History of CVA. New onset of numbness and weakness. Difficulty walking abnormal balance. EXAM: CT HEAD WITHOUT CONTRAST TECHNIQUE: Contiguous axial images were obtained from the base of the skull through the vertex without intravenous contrast. RADIATION DOSE REDUCTION: This exam was performed according to the departmental dose-optimization program which includes automated exposure control, adjustment of the mA and/or kV according to patient size and/or use of iterative reconstruction technique. COMPARISON:  MR head 09/07/2023 FINDINGS: Brain: Previous infarcts of the centrum semi ovale are noted. Generalized scratched at moderate generalized atrophy and white matter disease is stable. A lacunar infarct of the left caudate head is new since prior exam. No other acute cortical infarct is present. The ventricles are of normal size. Deep brain nuclei are otherwise within normal limits. The brainstem and cerebellum are within normal limits. Midline structures  are within normal limits. Vascular: Atherosclerotic calcifications are present  within the cavernous internal carotid arteries bilaterally and at dural margin of the right vertebral artery. No hyperdense vessel is present. Skull: Calvarium is intact. No focal lytic or blastic lesions are present. No significant extracranial soft tissue lesion is present. Sinuses/Orbits: The paranasal sinuses and mastoid air cells are clear. Bilateral lens replacements are noted. Globes and orbits are otherwise unremarkable. IMPRESSION: 1. New lacunar infarct of the left caudate head. MRI without contrast may be useful further evaluation of acuity. 2. Previous infarcts of the centrum semi ovale. 3. Stable moderate generalized atrophy and white matter disease. This likely reflects the sequela of chronic microvascular ischemia. These results will be called to the ordering clinician or representative by the Radiologist Assistant, and communication documented in the PACS or [employer]. Electronically Signed   By: Marin Roberts M.D.   On: 10/09/2023 15:16    ECG & Cardiac Imaging    Regular sinus rhythm, 65, anterior infarct- personally reviewed.  Assessment & Plan    1.  Recurrent stroke: Status post recent CVAs in October prompting placement of an implantable loop recorder.  She presented again today with ataxia and weakness over the past week and outpatient CT of the head showing new lacunar infarct of the left caudate head.  Here, she feels well and workup has been unremarkable.  Device interrogation shows no evidence of A-fib or other arrhythmias.  We have been asked to perform a TEE, which has tentatively been scheduled for December 4.  Medicine and neurology have agreed that patient may go home.  Patient will return in December 4 at 6:30 AM for 730 TEE with Dr. Mariah Milling.   After careful review of history and examination, the risks and benefits of transesophageal echocardiogram have been explained including risks of esophageal damage, perforation (1:10,000 risk), bleeding, pharyngeal  hematoma as well as other potential complications associated with conscious sedation including aspiration, arrhythmia, respiratory failure and death. Alternatives to treatment were discussed, questions were answered. Patient is willing to proceed.  DAPT/BP mgmt/Lipid mgmt per primary team.  Nicolasa Ducking, NP  10/12/2023 4:57 PM    Signed, Nicolasa Ducking, NP 10/12/2023, 4:47 PM

## 2023-10-12 NOTE — Assessment & Plan Note (Addendum)
Patient has a history of recurrent CVA, most recently on 10/21, where she received TNKase, and then again on 10/28.  Patient developed recurrent ataxia approximately 1 week ago with repeat imaging demonstrating recurrent CVA.  Discussed case with neurology; given strong suspicion for cardioembolic etiology, recommending admission for TEE.  Cardiology consulted with plans for outpatient TEE on 10/14/2023.  MRI was obtained and showed stable subacute CVA with no acute changes.  - Patient discharged home on home regimen with no additions

## 2023-10-12 NOTE — Assessment & Plan Note (Signed)
-   Restart home regimen given out of time range for permissive hypertension

## 2023-10-12 NOTE — H&P (View-Only) (Signed)
 History & Physical - for TEE 10/14/2023    Patient ID: JURIDIA NAMBO MRN: [id number], DOB/AGE: [id number]   Admit date: 10/12/2023   Primary Physician: Marguarite Arbour, MD Primary Cardiologist: Lanier Prude, MD  Patient Profile    82 year old female with a history of hypertension, hyperlipidemia, type 2 diabetes mellitus, nonalcoholic fatty liver disease, and recurrent stroke status post loop recorder, who presented to the emergency department December 2, w/ recurrent R sided wkns.  We've been asked to perform outpt TEE.  Past Medical History    Past Medical History:  Diagnosis Date   Arrhythmia    Arthritis    Basal cell carcinoma    right neck, right forehead - treated in the past   Cancer (HCC)    skin   CMV (cytomegalovirus infection) (HCC)    Colon polyps    Diabetes mellitus, type 2 (HCC)    Duodenitis    Dysrhythmia    Fatty liver disease, nonalcoholic    FH: colonic polyps    hx of-adenomatous   GERD (gastroesophageal reflux disease)    Heart murmur    dx'd in 1960s. followed by PCP   Hemorrhoid .   Hyperlipidemia    Hypertension    IBS (irritable bowel syndrome)    Osteoporosis    Pancreatitis    drug induced    Past Surgical History:  Procedure Laterality Date   ABDOMINAL HYSTERECTOMY     Carotids  3/08   mild only   CATARACT EXTRACTION W/PHACO Right 06/29/2017   Procedure: CATARACT EXTRACTION PHACO AND INTRAOCULAR LENS PLACEMENT (IOC)  Toric Diabetic Right;  Surgeon: Nevada Crane, MD;  Location: [facility];  Service: Ophthalmology;  Laterality: Right;  diabetic - oral meds   CATARACT EXTRACTION W/PHACO Right 07/27/2017   Procedure: CATARACT EXTRACTION PHACO AND INTRAOCULAR LENS PLACEMENT (IOC) LEFT DIABETIC TORIC;  Surgeon: Nevada Crane, MD;  Location: [facility];  Service: Ophthalmology;  Laterality: Right;  Diabetic - oral meds   COLONOSCOPY     COLONOSCOPY WITH PROPOFOL N/A 07/20/2019   Procedure: COLONOSCOPY WITH  PROPOFOL;  Surgeon: Toledo, Boykin Nearing, MD;  Location: [facility];  Service: Gastroenterology;  Laterality: N/A;   cystocele, cystourethropexy  12/87   dexa     increase T - 3.1 spine, normal femur 1/04   ESOPHAGOGASTRODUODENOSCOPY     ESOPHAGOGASTRODUODENOSCOPY (EGD) WITH PROPOFOL N/A 04/16/2018   Procedure: ESOPHAGOGASTRODUODENOSCOPY (EGD) WITH PROPOFOL;  Surgeon: Scot Jun, MD;  Location: [facility];  Service: Endoscopy;  Laterality: N/A;   ESOPHAGOGASTRODUODENOSCOPY (EGD) WITH PROPOFOL N/A 07/20/2019   Procedure: ESOPHAGOGASTRODUODENOSCOPY (EGD) WITH PROPOFOL;  Surgeon: Toledo, Boykin Nearing, MD;  Location: [facility] END[facility];  Service: Gastroenterology;  Laterality: N/A;   EYE SURGERY     hysterectomy (other)  1980   IR CT HEAD LTD  08/31/2023   IR PERCUTANEOUS ART THROMBECTOMY/INFUSION INTRACRANIAL INC DIAG ANGIO  08/31/2023   IR US GUIDE VASC ACCESS RIGHT  08/31/2023   LOOP RECORDER INSERTION Left 09/09/2023   Procedure: LOOP RECORDER INSERTION;  Surgeon: Lanier Prude, MD;  Location: [facility];  Service: Cardiovascular;  Laterality: Left;   RADIOLOGY WITH ANESTHESIA N/A 08/31/2023   Procedure: IR WITH ANESTHESIA;  Surgeon: Radiologist, Medication, MD;  Location: [facility] OR;  Service: Radiology;  Laterality: N/A;   TUBAL LIGATION  1978   vaginal deliveries     x2     Allergies  Allergies  Allergen Reactions   Alendronate Nausea Only   Codeine  Nausea Only   Lansoprazole     REACTION: hurts stomach   Sulfonamide Derivatives     Childhood reaction (doesn't remember)   Adhesive [Tape] Rash    Blisters if on too long    History of Present Illness    82 year old female with a history of hypertension, hyperlipidemia, type 2 diabetes mellitus, nonalcoholic fatty liver disease, and recurrent stroke.  She was admitted October 21 with recurrent CVA was treated with TNK and thrombectomy for left MCA occlusion.  Symptoms resolved and she was subsequently discharged but  had recurrent aphasia prompting repeat presentation October 28.  MRI at that time showed new focus of acute ischemia.  Echo showed an EF of 60 to 65% with moderate MR.  Bubble study was negative.  She was seen by our team and a loop recorder was placed.  EEG was negative for seizures.  She was placed on dual antiplatelet therapy.  Ms. Hempstead presented to the emergency department on December 2 in the setting of difficulty balancing, sensory ataxia, and global weakness, over the past week.  Her neurologist ordered a head CT, which was performed on November 29, and showed a new lacunar infarct of the left caudate head.  She was contacted and advised to present to the emergency department.  She was hypertensive on arrival at 194/64.  Her loop recorder was interrogated and showed no evidence of atrial fibrillation or other arrhythmias.  A TEE was requested and has been arranged to be performed on December 4 as an outpatient, the patient was discharged home from the ED.  Home Medications    Prior to Admission medications   Medication Sig Start Date End Date Taking? Authorizing Provider  aspirin EC 81 MG tablet Take 1 tablet (81 mg total) by mouth daily. Swallow whole. 09/07/23   Mathews Argyle, NP  Biotin 2500 MCG CAPS Take by mouth 2 (two) times daily.    [provider]  CALCIUM PO Take 750 mg by mouth 2 (two) times daily.    [provider]  Cholecalciferol (VITAMIN D PO) Take 500 Units by mouth 2 (two) times daily.    [provider]  clopidogrel (PLAVIX) 75 MG tablet Take 1 tablet (75 mg total) by mouth daily. 09/10/23 11/09/23  Regalado, Belkys A, MD  Cyanocobalamin (VITAMIN B 12 PO) Take 1 tablet by mouth daily.    [provider]  glipiZIDE (GLUCOTROL XL) 10 MG 24 hr tablet Take 10 mg by mouth 2 (two) times daily. 07/25/19   [provider]  glucose blood test strip 1 each by Other route as directed. Use as instructed    [provider]   HYDROmorphone (DILAUDID) 4 MG tablet Take 4 mg by mouth every 6 (six) hours as needed for moderate pain (pain score 4-6). 08/22/20   [provider]  insulin glargine (LANTUS SOLOSTAR) 100 UNIT/ML Solostar Pen Inject 35 Units into the skin daily. 09/09/23   Regalado, Belkys A, MD  ketoconazole (NIZORAL) 2 % cream Apply to skin folds once daily, twice daily with flares. Patient taking differently: Apply 1 Application topically daily as needed for irritation. 02/04/23   Willeen Niece, MD  Lancet Devices [facility]) lancets 1 each by Other route as needed. Use as instructed    [provider]  lisinopril (ZESTRIL) 20 MG tablet Take 20 mg by mouth daily.    [provider]  magnesium oxide (MAG-OX) 400 MG tablet Take 1 tablet by mouth daily. 01/02/21   [provider]  metFORMIN (GLUCOPHAGE-XR) 500 MG 24 hr tablet Take 1,000 mg by mouth 2 (two) times daily. 02/25/18   [provider]  metoprolol tartrate (LOPRESSOR) 25 MG tablet Take 1 tablet (25 mg total) by mouth 2 (two) times daily. 09/09/23   Regalado, Belkys A, MD  pantoprazole (PROTONIX) 40 MG tablet Take 40 mg by mouth 2 (two) times daily. 01/08/21   [provider]  rosuvastatin (CRESTOR) 40 MG tablet Take 1 tablet (40 mg total) by mouth daily. 09/10/23   Regalado, Prentiss Bells, MD    Family History    Family History  Problem Relation Age of Onset   Heart failure Mother    Coronary artery disease Mother    Heart attack Mother 54   Diabetes Mother    Heart failure Father    Coronary artery disease Father    Breast cancer Neg Hx    She indicated that her mother is deceased. She indicated that her father is deceased. She indicated that the status of her neg hx is unknown.   Social History    Social History   Socioeconomic History   Marital status: Married    Spouse name: Not on file   Number of children: Not on file   Years of education: Not on file   Highest education  level: Not on file  Occupational History   Occupation: retired  Tobacco Use   Smoking status: Former    Current packs/day: 0.00    Types: Cigarettes    Start date: 1972    Quit date: 1992    Years since quitting: 32.9   Smokeless tobacco: Never  Vaping Use   Vaping status: Never Used  Substance and Sexual Activity   Alcohol use: Not Currently    Comment: Occ -1x/mo   Drug use: No   Sexual activity: Not on file  Other Topics Concern   Not on file  Social History Narrative   Not on file   Social Determinants of Health   Financial Resource Strain: Not on file  Food Insecurity: No Food Insecurity (09/07/2023)   Hunger Vital Sign    Worried About Running Out of Food in the Last Year: Never true    Ran Out of Food in the Last Year: Never true  Transportation Needs: No Transportation Needs (09/07/2023)   PRAPARE - Administrator, Civil Service (Medical): No    Lack of Transportation (Non-Medical): No  Physical Activity: Not on file  Stress: Not on file  Social Connections: Not on file  Intimate Partner Violence: Not At Risk (09/07/2023)   Humiliation, Afraid, Rape, and Kick questionnaire    Fear of Current or Ex-Partner: No    Emotionally Abused: No    Physically Abused: No    Sexually Abused: No     Review of Systems    General:  No chills, fever, night sweats or weight changes.  Cardiovascular:  No chest pain, dyspnea on exertion, edema, orthopnea, palpitations, paroxysmal nocturnal dyspnea. Dermatological: No rash, lesions/masses Respiratory: No cough, dyspnea Urologic: No hematuria, dysuria Abdominal:   No nausea, vomiting, diarrhea, bright red blood per rectum, melena, or hematemesis Neurologic:  +++ difficulty balancing, sensory ataxia, and global weakness.  Some slurring of speech. No visual changes, changes in mental status. All other systems reviewed and are otherwise negative except as noted above.  Physical Exam    Blood pressure (!) 194/64,  pulse 77, temperature 98 F (36.7 C), temperature source Oral, resp. rate 17,  height 5\' 5"  (1.651 m), weight 75.3 kg, SpO2 98%.  General: Pleasant, NAD Psych: Normal affect. Neuro: Alert and oriented X 3. Moves all extremities spontaneously. HEENT: Normal  Neck: Supple without bruits or JVD. Lungs:  Resp regular and unlabored, CTA. Heart: RRR no s3, s4, or murmurs. Abdomen: Soft, non-tender, non-distended, BS + x 4.  Extremities: No clubbing, cyanosis or edema. DP/PT 2+, Radials 2+ and equal bilaterally.  Labs      Lab Results  Component Value Date   WBC 7.5 10/12/2023   HGB 15.0 10/12/2023   HCT 45.6 10/12/2023   MCV 95.4 10/12/2023   PLT 242 10/12/2023    Recent Labs  Lab 10/12/23 1004  NA 138  K 3.5  CL 103  CO2 25  BUN 18  CREATININE 0.99  CALCIUM 9.7  GLUCOSE 282*   Lab Results  Component Value Date   CHOL 146 09/07/2023   HDL 53 09/07/2023   LDLCALC 58 09/07/2023   TRIG 175 (H) 09/07/2023   Lab Results  Component Value Date   DDIMER 0.51 (H) 09/07/2023      Radiology Studies    MR BRAIN WO CONTRAST  Result Date: 10/12/2023 CLINICAL DATA:  Weakness, stroke-like symptoms EXAM: MRI HEAD WITHOUT CONTRAST TECHNIQUE: Multiplanar, multiecho pulse sequences of the brain and surrounding structures were obtained without intravenous contrast. COMPARISON:  09/07/2023 MRI head FINDINGS: Brain: Further diminishment in increased signal on diffusion-weighted imaging in the left caudate tail and left frontal lobe, with resolution of the previously noted ADC correlates, compatible with subacute infarcts. No evidence of additional acute or subacute infarct that was not present on the prior exam. No acute hemorrhage, mass, mass effect, or midline shift. No hydrocephalus or extra-axial collection. Craniocervical junction within normal limits. Redemonstrated petechial hemorrhage in the superior left medial frontal lobe. No new hemosiderin deposition. T2 hyperintense signal in the  periventricular white matter, likely the sequela of chronic small vessel ischemic disease. Vascular: Normal arterial flow voids. Skull and upper cervical spine: Normal marrow signal. Sinuses/Orbits: Clear paranasal sinuses. No acute finding in the orbits. Status post bilateral lens replacements. Other: The mastoid air cells are well aerated. IMPRESSION: 1. Further diminishment in diffusion restriction in the left caudate tail and left frontal lobe, with resolution of the previously noted ADC correlates, compatible with subacute infarcts. 2. No evidence of additional acute or subacute infarct that was not present on the prior exam. 3. Redemonstrated petechial hemorrhage in the superior left medial frontal lobe. No new hemosiderin deposition. Electronically Signed   By: Wiliam Ke M.D.   On: 10/12/2023 14:13   CT HEAD WO CONTRAST ( )  Result Date: 10/09/2023 CLINICAL DATA:  History of CVA. New onset of numbness and weakness. Difficulty walking abnormal balance. EXAM: CT HEAD WITHOUT CONTRAST TECHNIQUE: Contiguous axial images were obtained from the base of the skull through the vertex without intravenous contrast. RADIATION DOSE REDUCTION: This exam was performed according to the departmental dose-optimization program which includes automated exposure control, adjustment of the mA and/or kV according to patient size and/or use of iterative reconstruction technique. COMPARISON:  MR head 09/07/2023 FINDINGS: Brain: Previous infarcts of the centrum semi ovale are noted. Generalized scratched at moderate generalized atrophy and white matter disease is stable. A lacunar infarct of the left caudate head is new since prior exam. No other acute cortical infarct is present. The ventricles are of normal size. Deep brain nuclei are otherwise within normal limits. The brainstem and cerebellum are within normal limits. Midline structures  are within normal limits. Vascular: Atherosclerotic calcifications are present  within the cavernous internal carotid arteries bilaterally and at dural margin of the right vertebral artery. No hyperdense vessel is present. Skull: Calvarium is intact. No focal lytic or blastic lesions are present. No significant extracranial soft tissue lesion is present. Sinuses/Orbits: The paranasal sinuses and mastoid air cells are clear. Bilateral lens replacements are noted. Globes and orbits are otherwise unremarkable. IMPRESSION: 1. New lacunar infarct of the left caudate head. MRI without contrast may be useful further evaluation of acuity. 2. Previous infarcts of the centrum semi ovale. 3. Stable moderate generalized atrophy and white matter disease. This likely reflects the sequela of chronic microvascular ischemia. These results will be called to the ordering clinician or representative by the Radiologist Assistant, and communication documented in the PACS or [employer]. Electronically Signed   By: Marin Roberts M.D.   On: 10/09/2023 15:16    ECG & Cardiac Imaging    Regular sinus rhythm, 65, anterior infarct- personally reviewed.  Assessment & Plan    1.  Recurrent stroke: Status post recent CVAs in October prompting placement of an implantable loop recorder.  She presented again today with ataxia and weakness over the past week and outpatient CT of the head showing new lacunar infarct of the left caudate head.  Here, she feels well and workup has been unremarkable.  Device interrogation shows no evidence of A-fib or other arrhythmias.  We have been asked to perform a TEE, which has tentatively been scheduled for December 4.  Medicine and neurology have agreed that patient may go home.  Patient will return in December 4 at 6:30 AM for 730 TEE with Dr. Mariah Milling.   After careful review of history and examination, the risks and benefits of transesophageal echocardiogram have been explained including risks of esophageal damage, perforation (1:10,000 risk), bleeding, pharyngeal  hematoma as well as other potential complications associated with conscious sedation including aspiration, arrhythmia, respiratory failure and death. Alternatives to treatment were discussed, questions were answered. Patient is willing to proceed.  DAPT/BP mgmt/Lipid mgmt per primary team.  Nicolasa Ducking, NP  10/12/2023 4:57 PM    Signed, Nicolasa Ducking, NP 10/12/2023, 4:47 PM

## 2023-10-12 NOTE — Discharge Instructions (Signed)
Molly [name],   Today you were evaluated in the ER due to concerns for additional stroke, which the MRI confirmed. Next steps include a procedure called trans-esophageal echocardiogram. This will allow [place] to look for blood clots in your heart that may be causing your strokes. The cardiology team have provided you with the pre-op information.   It was a pleasure meeting you and your husband. We wish you the best!   Sincerely,  Dr. Huel Cote

## 2023-10-12 NOTE — ED Triage Notes (Signed)
Pt here with weakness. Pt was sent here for stroke like symptoms from the primary doctors office. Pt had a CT of her head on 10/09/23 that showed a new lacunar infarct of the left caudate head. Pt states she is having weakness.

## 2023-10-13 ENCOUNTER — Other Ambulatory Visit: Payer: Self-pay | Admitting: Cardiovascular Disease

## 2023-10-13 MED ORDER — SODIUM CHLORIDE 0.9% FLUSH: 10.0000 mL | Freq: Two times a day (BID) | INTRAVENOUS | Status: DC

## 2023-10-14 ENCOUNTER — Encounter: Payer: Self-pay | Admitting: Cardiovascular Disease

## 2023-10-14 ENCOUNTER — Ambulatory Visit: Payer: PPO | Admitting: Anesthesiology

## 2023-10-14 ENCOUNTER — Ambulatory Visit
Admission: RE | Admit: 2023-10-14 | Discharge: 2023-10-14 | Disposition: A | Discharge status: Home / Self Care | Payer: PPO | Attending: Cardiovascular Disease | Admitting: Cardiovascular Disease

## 2023-10-14 ENCOUNTER — Ambulatory Visit
Admission: RE | Admit: 2023-10-14 | Discharge: 2023-10-14 | Disposition: A | Discharge status: Home / Self Care | Payer: PPO | Source: Home / Self Care | Attending: Nurse Practitioner | Admitting: Cardiovascular Disease

## 2023-10-14 ENCOUNTER — Encounter
Admission: RE | Disposition: A | Discharge status: Home / Self Care | Payer: Self-pay | Source: Home / Self Care | Attending: Cardiovascular Disease

## 2023-10-14 DIAGNOSIS — R278 Other lack of coordination: Secondary | ICD-10-CM | POA: Diagnosis not present

## 2023-10-14 DIAGNOSIS — Z79899 Other long term (current) drug therapy: Secondary | ICD-10-CM | POA: Insufficient documentation

## 2023-10-14 DIAGNOSIS — I361 Nonrheumatic tricuspid (valve) insufficiency: Secondary | ICD-10-CM | POA: Diagnosis not present

## 2023-10-14 DIAGNOSIS — Z87891 Personal history of nicotine dependence: Secondary | ICD-10-CM | POA: Diagnosis not present

## 2023-10-14 DIAGNOSIS — I10 Essential (primary) hypertension: Secondary | ICD-10-CM | POA: Insufficient documentation

## 2023-10-14 DIAGNOSIS — I6389 Other cerebral infarction: Secondary | ICD-10-CM | POA: Diagnosis not present

## 2023-10-14 DIAGNOSIS — I6381 Other cerebral infarction due to occlusion or stenosis of small artery: Secondary | ICD-10-CM | POA: Diagnosis not present

## 2023-10-14 DIAGNOSIS — R29818 Other symptoms and signs involving the nervous system: Secondary | ICD-10-CM | POA: Diagnosis not present

## 2023-10-14 DIAGNOSIS — K760 Fatty (change of) liver, not elsewhere classified: Secondary | ICD-10-CM | POA: Insufficient documentation

## 2023-10-14 DIAGNOSIS — E119 Type 2 diabetes mellitus without complications: Secondary | ICD-10-CM | POA: Diagnosis not present

## 2023-10-14 DIAGNOSIS — I639 Cerebral infarction, unspecified: Secondary | ICD-10-CM | POA: Diagnosis not present

## 2023-10-14 DIAGNOSIS — E785 Hyperlipidemia, unspecified: Secondary | ICD-10-CM | POA: Diagnosis not present

## 2023-10-14 DIAGNOSIS — I340 Nonrheumatic mitral (valve) insufficiency: Secondary | ICD-10-CM | POA: Diagnosis not present

## 2023-10-14 DIAGNOSIS — R262 Difficulty in walking, not elsewhere classified: Secondary | ICD-10-CM | POA: Diagnosis not present

## 2023-10-14 DIAGNOSIS — R200 Anesthesia of skin: Secondary | ICD-10-CM | POA: Diagnosis not present

## 2023-10-14 DIAGNOSIS — Z7984 Long term (current) use of oral hypoglycemic drugs: Secondary | ICD-10-CM | POA: Diagnosis not present

## 2023-10-14 DIAGNOSIS — M542 Cervicalgia: Secondary | ICD-10-CM | POA: Diagnosis not present

## 2023-10-14 DIAGNOSIS — K219 Gastro-esophageal reflux disease without esophagitis: Secondary | ICD-10-CM | POA: Insufficient documentation

## 2023-10-14 DIAGNOSIS — R531 Weakness: Secondary | ICD-10-CM | POA: Diagnosis not present

## 2023-10-14 DIAGNOSIS — Z794 Long term (current) use of insulin: Secondary | ICD-10-CM | POA: Diagnosis not present

## 2023-10-14 DIAGNOSIS — Z7902 Long term (current) use of antithrombotics/antiplatelets: Secondary | ICD-10-CM | POA: Diagnosis not present

## 2023-10-14 DIAGNOSIS — Z8673 Personal history of transient ischemic attack (TIA), and cerebral infarction without residual deficits: Secondary | ICD-10-CM | POA: Diagnosis not present

## 2023-10-14 HISTORY — PX: TEE WITHOUT CARDIOVERSION: SHX5443

## 2023-10-14 LAB — GLUCOSE, CAPILLARY: Glucose-Capillary: 153 mg/dL — ABNORMAL HIGH (ref 70–99)

## 2023-10-14 LAB — ECHO TEE

## 2023-10-14 SURGERY — ECHOCARDIOGRAM, TRANSESOPHAGEAL
Anesthesia: General

## 2023-10-14 MED ORDER — SODIUM CHLORIDE 0.9 % IV SOLN: INTRAVENOUS | Status: DC | PRN

## 2023-10-14 MED ORDER — PROPOFOL 10 MG/ML IV BOLUS
INTRAVENOUS | Status: DC | PRN
  Administered 2023-10-14: 30 mg via INTRAVENOUS
  Administered 2023-10-14: 50 mg via INTRAVENOUS
  Administered 2023-10-14: 20 mg via INTRAVENOUS
  Administered 2023-10-14: 30 mg via INTRAVENOUS

## 2023-10-14 MED ORDER — PROPOFOL 10 MG/ML IV BOLUS
INTRAVENOUS | Status: AC
Start: 2023-10-14 — End: ?
  Filled 2023-10-14: qty 20

## 2023-10-14 MED ORDER — PROPOFOL 10 MG/ML IV BOLUS
INTRAVENOUS | Status: AC
  Filled 2023-10-14: qty 20

## 2023-10-14 MED ORDER — BUTAMBEN-TETRACAINE-BENZOCAINE 2-2-14 % EX AERO
INHALATION_SPRAY | CUTANEOUS | Status: AC
Start: 2023-10-14 — End: ?
  Filled 2023-10-14: qty 5

## 2023-10-14 MED ORDER — LIDOCAINE VISCOUS HCL 2 % MT SOLN
OROMUCOSAL | Status: AC
  Filled 2023-10-14: qty 15

## 2023-10-14 NOTE — Progress Notes (Signed)
*  PRELIMINARY RESULTS* Echocardiogram Echocardiogram Transesophageal has been performed.  Cristela Blue 10/14/2023, 8:26 AM

## 2023-10-14 NOTE — Transfer of Care (Signed)
Immediate Anesthesia Transfer of Care Note  Patient: Molly [name]  Procedure(s) Performed: TRANSESOPHAGEAL ECHOCARDIOGRAM (TEE)  Patient Location: Cath Lab  Anesthesia Type:General  Level of Consciousness: awake, alert , and oriented  Airway & Oxygen Therapy: Patient Spontanous Breathing  Post-op Assessment: Report given to RN and Post -op Vital signs reviewed and stable  Post vital signs: Reviewed and stable  Last Vitals:  Vitals Value Taken Time  BP 140/61 10/14/23 0818  Temp    Pulse 73 10/14/23 0819  Resp 17 10/14/23 0819  SpO2 99 % 10/14/23 0819    Last Pain:  Vitals:   10/14/23 0704  TempSrc: Oral  PainSc: 0-No pain         Complications: No notable events documented.

## 2023-10-14 NOTE — Anesthesia Postprocedure Evaluation (Signed)
Anesthesia Post Note  Patient: Molly [name]  Procedure(s) Performed: TRANSESOPHAGEAL ECHOCARDIOGRAM (TEE)  Patient location during evaluation: PACU Anesthesia Type: General Level of consciousness: awake and alert, oriented and patient cooperative Pain management: pain level controlled Vital Signs Assessment: post-procedure vital signs reviewed and stable Respiratory status: spontaneous breathing, nonlabored ventilation and respiratory function stable Cardiovascular status: blood pressure returned to baseline and stable Postop Assessment: adequate PO intake Anesthetic complications: no   No notable events documented.   Last Vitals:  Vitals:   10/14/23 0830 10/14/23 0845  BP: (!) 143/62 (!) 148/63  Pulse: 78 71  Resp: 17 14  Temp:    SpO2: 99% 96%    Last Pain:  Vitals:   10/14/23 0845  TempSrc:   PainSc: 0-No pain                 Reed Breech

## 2023-10-14 NOTE — Anesthesia Procedure Notes (Signed)
Date/Time: 10/14/2023 7:49 AM  Performed by: Joanette Gula, Kalee Broxton, CRNAPre-anesthesia Checklist: Patient identified, Emergency Drugs available, Suction available, Patient being monitored and Timeout performed Patient Re-evaluated:Patient Re-evaluated prior to induction Oxygen Delivery Method: Nasal cannula Induction Type: IV induction

## 2023-10-14 NOTE — Anesthesia Preprocedure Evaluation (Addendum)
Anesthesia Evaluation  Patient identified by MRN, date of birth, ID band Patient awake    Reviewed: Allergy & Precautions, NPO status , Patient's Chart, lab work & pertinent test results  History of Anesthesia Complications Negative for: history of anesthetic complications  Airway Mallampati: III   Neck ROM: Full    Dental  (+) Missing   Pulmonary former smoker (quit 1992)   Pulmonary exam normal breath sounds clear to auscultation       Cardiovascular hypertension, Normal cardiovascular exam+ dysrhythmias  Rhythm:Regular Rate:Normal  ECG 10/12/23: NSR; septal infarct, age undetermined  Echo 02/04/21: NORMAL LEFT VENTRICULAR SYSTOLIC FUNCTION  NORMAL RIGHT VENTRICULAR SYSTOLIC FUNCTION  Moderate LAE  Moderate Mitral valve Regurgitation  Mild Tricuspid and Aortic valve regurgitation  Trivial Pulmonic valve regurgitation  Study unchanged from 2019     Neuro/Psych CVA (on Plavix)    GI/Hepatic ,GERD  ,,  Endo/Other  diabetes, Type 2    Renal/GU      Musculoskeletal  (+) Arthritis ,    Abdominal   Peds  Hematology negative hematology ROS (+)   Anesthesia Other Findings   Reproductive/Obstetrics                             Anesthesia Physical Anesthesia Plan  ASA: 3  Anesthesia Plan: General   Post-op Pain Management:    Induction: Intravenous  PONV Risk Score and Plan: 3 and Propofol infusion, TIVA and Treatment may vary due to age or medical condition  Airway Management Planned: Natural Airway  Additional Equipment:   Intra-op Plan:   Post-operative Plan:   Informed Consent: I have reviewed the patients History and Physical, chart, labs and discussed the procedure including the risks, benefits and alternatives for the proposed anesthesia with the patient or authorized representative who has indicated his/her understanding and acceptance.       Plan Discussed with:  CRNA  Anesthesia Plan Comments: (LMA/GETA backup discussed.  Patient consented for risks of anesthesia including but not limited to:  - adverse reactions to medications - damage to eyes, teeth, lips or other oral mucosa - nerve damage due to positioning  - sore throat or hoarseness - damage to heart, brain, nerves, lungs, other parts of body or loss of life  Informed patient about role of CRNA in peri- and intra-operative care.  Patient voiced understanding.)       Anesthesia Quick Evaluation

## 2023-10-14 NOTE — Procedures (Signed)
Transesophageal Echocardiogram :  Indication: CVA Requesting/ordering  physician: Dr. Huel Cote  Procedure: Benzocaine spray x2 and 2 mls x 2 of viscous lidocaine were given orally to provide local anesthesia to the oropharynx. The patient was positioned supine on the left side, bite block provided. The patient was moderately sedated with the doses of versed and fentanyl as detailed below.  Using digital technique an omniplane probe was advanced into the distal esophagus without incident.   Moderate sedation: 1. Sedation used:  per anesthesia team  See report in EPIC  for complete details: In brief, transgastric imaging revealed normal LV function with no RWMAs and no mural apical thrombus.  .  Estimated ejection fraction was 55%.  Right sided cardiac chambers were normal with no evidence of pulmonary hypertension.  Imaging of the septum showed no ASD or VSD Bubble study was negative for shunt 2D and color flow confirmed no PFO  The LA was well visualized in orthogonal views.  There was no spontaneous contrast and no thrombus in the LA and LA appendage At least moderate dilation of left atrium.  The descending thoracic aorta had no  mural aortic debris with no evidence of aneurysmal dilation or dissection. Moderate diffuse aortic atherosclerosis including arch and descending aorta.    Molly [name] 10/14/2023 8:29 AM

## 2023-10-15 ENCOUNTER — Encounter: Payer: Self-pay | Admitting: Cardiovascular Disease

## 2023-10-16 NOTE — H&P (Signed)
Patient was seen and evaluated prior to transesophageal echo procedure Symptoms, prior testing details again confirmed with the patient Patient examined, no significant change from prior exam Lab work reviewed in detail personally by myself After careful review of history and examination, the risks and benefits of transesophageal echocardiogram have been explained including risks of esophageal damage, perforation (1:10,000 risk), bleeding, pharyngeal hematoma as well as other potential complications associated with conscious sedation including aspiration, arrhythmia, respiratory failure and death. Alternatives to treatment were discussed, questions were answered.  Patient is willing to proceed.   Signed, Dossie Arbour, MD, Ph.D [facility]

## 2023-10-16 NOTE — Interval H&P Note (Signed)
History and Physical Interval Note:  10/16/2023 6:14 PM  Molly [name]  has presented today for surgery, with the diagnosis of stroke.  The various methods of treatment have been discussed with the patient and family. After consideration of risks, benefits and other options for treatment, the patient has consented to  Procedure(s): TRANSESOPHAGEAL ECHOCARDIOGRAM (TEE) (N/A) as a surgical intervention.  The patient's history has been reviewed, patient examined, no change in status, stable for surgery.  I have reviewed the patient's chart and labs.  Questions were answered to the patient's satisfaction.     [name]

## 2023-10-19 ENCOUNTER — Ambulatory Visit: Payer: PPO | Attending: Cardiovascular Disease | Admitting: Cardiovascular Disease

## 2023-10-19 ENCOUNTER — Encounter: Payer: Self-pay | Admitting: Cardiovascular Disease

## 2023-10-19 VITALS — BP 170/67 | HR 67 | Ht 64.0 in | Wt 165.0 lb

## 2023-10-19 DIAGNOSIS — Z794 Long term (current) use of insulin: Secondary | ICD-10-CM

## 2023-10-19 DIAGNOSIS — E118 Type 2 diabetes mellitus with unspecified complications: Secondary | ICD-10-CM

## 2023-10-19 DIAGNOSIS — E782 Mixed hyperlipidemia: Secondary | ICD-10-CM

## 2023-10-19 DIAGNOSIS — I10 Essential (primary) hypertension: Secondary | ICD-10-CM | POA: Diagnosis not present

## 2023-10-19 DIAGNOSIS — I700 Atherosclerosis of aorta: Secondary | ICD-10-CM

## 2023-10-19 DIAGNOSIS — I639 Cerebral infarction, unspecified: Secondary | ICD-10-CM | POA: Diagnosis not present

## 2023-10-19 NOTE — Patient Instructions (Signed)

## 2023-10-19 NOTE — Progress Notes (Signed)
Cardiology Office Note:  .   Date:  10/19/2023  ID:  Molly [name], DOB [birth year], MRN [id number] PCP: Marguarite Arbour, MD  [facility] Providers Cardiologist:  Lanier Prude, MD    History of Present Illness: .   Molly [name] is a 82 y.o. female who is here to establish general cardiology follow-up.  She had 2 strokes this year: on 09/07/2023 (left centrum semiovale, transient right hemiparesis) when she was started on aspirin and clopidogrel and the second event on 10/12/2023 (new lacunar infarct of the left caudate head, generalized weakness and sensory ataxia).  She has recovered well, without neurological sequelae.  Additional problems include type 2 diabetes mellitus, dyslipidemia, hypertension, osteoporosis with thoracic and lumbar spine compression fractures GERD and a remote history of drug-induced acute pancreatitis.  Workup has included transthoracic and transesophageal echo, which shows no cardioembolic source of stroke, known moderate mitral insufficiency, severe left atrial dilation without visible clot and moderate atherosclerosis of the aortic arch.  She received an implantable loop recorder 09/09/2023.  She underwent carotid CT angiography which showed no evidence large vessel stenosis and only mild carotid atherosclerosis.  The left vertebral artery may be occluded proximally.  CT angiography when she initially presented with stroke did show occlusion of the distal M1 segment and proximal superior and inferior M2 branches  ROS: The patient specifically denies any chest pain at rest or with exertion, dyspnea at rest or with exertion, orthopnea, paroxysmal nocturnal dyspnea, syncope, palpitations, focal neurological deficits, intermittent claudication, change in her usual pattern of unilateral left ankle edema, unexplained weight gain, cough, hemoptysis or wheezing.   Studies Reviewed: Marland Kitchen   EKG Interpretation Date/Time:  Monday October 19 2023 10:55:21 EST Ventricular  Rate:  67 PR Interval:  170 QRS Duration:  82 QT Interval:  420 QTC Calculation: 443 R Axis:   -9  Text Interpretation: Normal sinus rhythm Septal infarct (cited on or before 12-Oct-2023) When compared with ECG of 12-Oct-2023 10:17, No significant change was found Confirmed by Jorja Empie [phone]) on 10/19/2023 10:59:33 AM    Echocardiogram [facility] 02/04/2021  NORMAL LEFT VENTRICULAR SYSTOLIC FUNCTION  NORMAL RIGHT VENTRICULAR SYSTOLIC FUNCTION  Moderate LAE  Moderate Mitral valve Regurgitation  Mild Tricuspid and Aortic valve regurgitation  Trivial Pulmonic valve regurgitation  Study unchanged from 2019   Echocardiogram 09/01/2023   1. Left ventricular ejection fraction, by estimation, is 60 to 65%. The  left ventricle has normal function. The left ventricle has no regional  wall motion abnormalities. Left ventricular diastolic parameters are  indeterminate.   2. Right ventricular systolic function is normal. The right ventricular  size is normal.   3. The mitral valve is degenerative. Moderate mitral valve regurgitation.  No evidence of mitral stenosis. Moderate mitral annular calcification.   4. The aortic valve is normal in structure. Aortic valve regurgitation is  mild to moderate. No aortic stenosis is present.   5. The inferior vena cava is normal in size with greater than 50%  respiratory variability, suggesting right atrial pressure of 3 mmHg.   6. Agitated saline contrast bubble study was negative, with no evidence  of any interatrial shunt.   Conclusion(s)/Recommendation(s): No intracardiac source of embolism  detected on this transthoracic study. Consider a transesophageal  echocardiogram to exclude cardiac source of embolism if clinically  indicated.   Transesophageal echocardiogram 10/14/2023    1. Left ventricular ejection fraction, by estimation, is 55 to 60%. The  left ventricle has normal function. The  left ventricle has no regional  wall  motion abnormalities.   2. Right ventricular systolic function is normal. The right ventricular  size is normal.   3. Left atrial size was severely dilated. No left atrial/left atrial  appendage thrombus was detected.   4. The mitral valve is normal in structure. Moderate mitral valve  regurgitation. No evidence of mitral stenosis.   5. The aortic valve is calcified. There is moderate calcification of the  aortic valve. Aortic valve regurgitation is not visualized. Aortic valve  sclerosis/calcification is present, without any evidence of aortic  stenosis.   6. There is Moderate (Grade III) atheroma plaque involving the ascending  aorta, aortic arch and descending aorta.   7. The inferior vena cava is normal in size with greater than 50%  respiratory variability, suggesting right atrial pressure of 3 mmHg.   8. Agitated saline contrast bubble study was negative, with no evidence  of any interatrial shunt.   Conclusion(s)/Recommendation(s): Normal biventricular function without  evidence of hemodynamically significant valvular heart disease.  Risk Assessment/Calculations:     HYPERTENSION CONTROL Vitals:   10/19/23 1050 10/19/23 1141  BP: (!) 152/60 (!) 170/67    The patient's blood pressure is elevated above target today.  In order to address the patient's elevated BP: Blood pressure will be monitored at home to determine if medication changes need to be made.          Physical Exam:   VS:  BP (!) 170/67   Pulse 67   Ht 5\' 4"  (1.626 m)   Wt 165 lb (74.8 kg)   SpO2 95%   BMI 28.32 kg/m    Wt Readings from Last 3 Encounters:  10/19/23 165 lb (74.8 kg)  10/14/23 165 lb (74.8 kg)  10/12/23 166 lb (75.3 kg)    GEN: Well nourished, well developed in no acute distress NECK: No JVD; bilateral carotid bruit, particularly prominent on the left CARDIAC: RRR, 2/6 early peaking aortic ejection murmur, 1/6 apical holosystolic murmur, no diastolic murmurs, rubs, gallops RESPIRATORY:   Clear to auscultation without rales, wheezing or rhonchi  ABDOMEN: Soft, non-tender, non-distended EXTREMITIES: 1-2+ localized left ankle edema; No deformity   ASSESSMENT AND PLAN: .   Cryptogenic stroke: The location of her stroke, typical for an embolic event as well as the fact that she has a severely dilated left atrium raise strong suspicion for atrial fibrillation, but this arrhythmia has not yet been detected.  She has an implantable loop recorder in place.  I told her it would be at least a year before we can be confident that her stroke is not from asymptomatic paroxysmal atrial fibrillation.  In the meantime, dual antiplatelet therapy with aspirin and clopidogrel is recommended.  She would like to establish neurology follow-up in the same practice as her husband, at [facility] neurology. Aortic atherosclerosis: Prominent atherosclerosis in the aortic arch could be another source of her stroke.  She is on dual antiplatelet therapy and aggressive lipid-lowering. HTN: She has a very broad pulse pressure.  152/60 in clinic today.  I do not think we can reduce her systolic blood pressure further without causing a dangerous decrease in her diastolic blood pressure which might actually increase the risk of future neurological events. HLP: LDL cholesterol 58 and HDL 53, minimally elevated triglycerides.  Continue rosuvastatin. DM: Good hemoglobin A1c at 6.7%.  Currently on metformin and a low-dose of glipizide as well as Lantus.  It may be worthwhile transitioning to newer agents for their  better cardiovascular outcome profile.  However, she has had a history of drug-induced pancreatitis.  GLP-1 agonists may not be the best choice for her. MR: Not clinically symptomatic.  May be part of the reason why she has a dilated left atrium.  Moderate by both transthoracic and transesophageal echo.  Also has a murmur of aortic valve sclerosis, no stenosis on echo. Carotid bruits are present, but there is no  significant stenosis on either side by CT angiography.       Dispo: Monthly loop recorder downloads.  Follow-up in 1 year.  Signed, Thurmon Fair, MD

## 2023-10-22 ENCOUNTER — Encounter: Payer: Self-pay | Admitting: Cardiovascular Disease

## 2023-10-23 DIAGNOSIS — K219 Gastro-esophageal reflux disease without esophagitis: Secondary | ICD-10-CM | POA: Diagnosis not present

## 2023-10-23 DIAGNOSIS — R42 Dizziness and giddiness: Secondary | ICD-10-CM | POA: Diagnosis not present

## 2023-10-23 DIAGNOSIS — E782 Mixed hyperlipidemia: Secondary | ICD-10-CM | POA: Diagnosis not present

## 2023-10-23 DIAGNOSIS — E118 Type 2 diabetes mellitus with unspecified complications: Secondary | ICD-10-CM | POA: Diagnosis not present

## 2023-10-23 DIAGNOSIS — I10 Essential (primary) hypertension: Secondary | ICD-10-CM | POA: Diagnosis not present

## 2023-10-26 NOTE — Progress Notes (Signed)
NEUROLOGY CONSULTATION NOTE  Molly [name] MRN: [id number] DOB: February 09, 1941  Referring provider: Thurmon Fair MD Primary care provider: Aram Beecham, MD  Reason for consult:  stroke  Assessment/Plan:   Left hemispheric territory infarcts, appears embolic of unknown source but suspicious for cardiac Hypertension Hyperlipidemia Type 2 diabetes mellitus Dizziness - I am not convinced that the third admission earlier this month was a stroke.  MRI was negative but she did not exhibit any clear lateralizing symptoms like the first 2 events - nonspecific unsteadiness, generalized weakness and dizziness.  It may have been a peripheral vestibulopathy.     Finish DAPT (ASA 81mg  and Plavix 75mg  daily) for remainder of 3 months followed by ASA 81mg  daily alone Secondary stroke prevention as managed by PCP/cardiology: Statin.  LDL goal less than 70 Normotensive blood pressure Hgb A1c goal less than 7 Monitor for a fib via loop recorder Mediterranean diet Vestibular rehab as ordered by PCP. Follow up 6 months.   Subjective:  Molly [name] is an 82 year old right-handed with HTN, HLD, non-alcoholic fatty liver disease, DM 2, IBS and history of basal cell carcinoma who presents for strokes.  History supplemented by her accompanying husband as well as the hospital and referring provider's notes.  Patient has had recurrent acute strokes since October.  On 10/21, she developed sudden onset right sided facial droop, right sided weakness and aphasia.  She presented to [facility] where she received TNK.  MRI of brain revealed patchy acute infarcts within the left MCA and bilateral ACA territories.  CTA head and neck revealed distal left M1 and proximal superior and inferior M2 occlusions.  Underwent mechanical thrombectomy resulting in patent left MCA.  2D echo showed EF 60-65% with negative bubble study.  LDL 63 and Hgb A1c was 6.7.  She was not on antithrombotic therapy and was discharged on ASA and Plavix  for 3 months followed by ASA alone.  Due to involvement of bilateral ACA territories.  Discharged on 10/27 but next day she had recurrence of aphasia but also endorsed headache, left sided arm tingling and chest pain.  Admitted to [facility].  MRI of brain showed news acute infarct at the left centrum semiovale.  EP consulted and underwent loop recorder implant.  EEG showed cortical dysfunction in the left frontotemporal region likely secondary to underlying stroke but study was negative for epileptiform discharges.  Maintained on DAPT and changed from simvastatin to rosuvastatin.  Following discharge on 10/30, she experienced generalized weakness.  She started feeling more dizzy.  Outpatient CT head on 11/29 demonstrated new lacunar infarct of the left caudate head.  Readmitted to [facility] on 12/2 for further evaluation.  MRI of brain showed evidence of known subacute infarcts in the left caudate tail and left frontal lobe but no evidence of new infarcts.  TEE on 12/4 revealed EF 55-60% and severely dilated left atrium with no evidence of left atrial appendate thrombus, significant valvular disease or PFO.  She sill feels a little dizzy and will be starting physical therapy.   08/31/2023 CTA HEAD & NECK:  1. The distal left M1 segment/proximal superior and inferior M2 segments are now occluded for a short-segment with diminished collateral flow throughout the distal MCA territory. 2. No evidence of evolved infarct in the left MCA distribution or intracranial hemorrhage. 09/01/2023 MRI BRAIN WO:  Acute left MCA and ACA territory infarcts involving the left frontal lobe, left parietal lobe, and the body and head of the caudate on the left.  No hemorrhage. 09/01/2023 CTA HEAD & NECK:  1. No intracranial arterial occlusion or high-grade stenosis. The left middle cerebral artery is now patent. 2. Unchanged appearance of the left vertebral artery which terminates in PICA and may be occluded proximally. 09/07/2023 MRI BRAIN  WO: 1. Much of the previously seen abnormal diffusion restriction has resolved. Persistent diffusion restriction of the left precentral gyrus, caudate tail and medial frontal gyrus. 2. New focus of acute ischemia at the left centrum semiovale. 3. Petechial blood products at the superior left hemisphere  09/07/2023 CTA HEAD & NECK:  1. No large vessel occlusion or hemodynamically significant stenosis in the head or neck. 2. Mild atherosclerosis of the carotid arteries. 10/12/2023 MRI BRAIN WO:  1. Further diminishment in diffusion restriction in the left caudate tail and left frontal lobe, with resolution of the previously noted ADC correlates, compatible with subacute infarcts. 2. No evidence of additional acute or subacute infarct that was not present on the prior exam. 3. Redemonstrated petechial hemorrhage in the superior left medial frontal lobe. No new hemosiderin deposition.     PAST MEDICAL HISTORY: Past Medical History:  Diagnosis Date   Arrhythmia    Arthritis    Basal cell carcinoma    right neck, right forehead - treated in the past   Cancer (HCC)    skin   CMV (cytomegalovirus infection) (HCC)    Colon polyps    Diabetes mellitus, type 2 (HCC)    Duodenitis    Dysrhythmia    Fatty liver disease, nonalcoholic    FH: colonic polyps    hx of-adenomatous   GERD (gastroesophageal reflux disease)    Heart murmur    dx'd in 1960s. followed by PCP   Hemorrhoid .   Hyperlipidemia    Hypertension    IBS (irritable bowel syndrome)    Osteoporosis    Pancreatitis    drug induced    PAST SURGICAL HISTORY: Past Surgical History:  Procedure Laterality Date   ABDOMINAL HYSTERECTOMY     Carotids  3/08   mild only   CATARACT EXTRACTION W/PHACO Right 06/29/2017   Procedure: CATARACT EXTRACTION PHACO AND INTRAOCULAR LENS PLACEMENT (IOC)  Toric Diabetic Right;  Surgeon: Nevada Crane, MD;  Location: [facility];  Service: Ophthalmology;  Laterality: Right;  diabetic -  oral meds   CATARACT EXTRACTION W/PHACO Right 07/27/2017   Procedure: CATARACT EXTRACTION PHACO AND INTRAOCULAR LENS PLACEMENT (IOC) LEFT DIABETIC TORIC;  Surgeon: Nevada Crane, MD;  Location: [facility];  Service: Ophthalmology;  Laterality: Right;  Diabetic - oral meds   COLONOSCOPY     COLONOSCOPY WITH PROPOFOL N/A 07/20/2019   Procedure: COLONOSCOPY WITH PROPOFOL;  Surgeon: Toledo, Boykin Nearing, MD;  Location: [facility] END[facility];  Service: Gastroenterology;  Laterality: N/A;   cystocele, cystourethropexy  12/87   dexa     increase T - 3.1 spine, normal femur 1/04   ESOPHAGOGASTRODUODENOSCOPY     ESOPHAGOGASTRODUODENOSCOPY (EGD) WITH PROPOFOL N/A 04/16/2018   Procedure: ESOPHAGOGASTRODUODENOSCOPY (EGD) WITH PROPOFOL;  Surgeon: Scot Jun, MD;  Location: [facility];  Service: Endoscopy;  Laterality: N/A;   ESOPHAGOGASTRODUODENOSCOPY (EGD) WITH PROPOFOL N/A 07/20/2019   Procedure: ESOPHAGOGASTRODUODENOSCOPY (EGD) WITH PROPOFOL;  Surgeon: Toledo, Boykin Nearing, MD;  Location: [facility] END[facility]COPY;  Service: Gastroenterology;  Laterality: N/A;   EYE SURGERY     hysterectomy (other)  1980   IR CT HEAD LTD  08/31/2023   IR PERCUTANEOUS ART THROMBECTOMY/INFUSION INTRACRANIAL INC DIAG ANGIO  08/31/2023   IR US GUIDE  VASC ACCESS RIGHT  08/31/2023   LOOP RECORDER INSERTION Left 09/09/2023   Procedure: LOOP RECORDER INSERTION;  Surgeon: Lanier Prude, MD;  Location: [facility] IN[facility];  Service: Cardiovascular;  Laterality: Left;   RADIOLOGY WITH ANESTHESIA N/A 08/31/2023   Procedure: IR WITH ANESTHESIA;  Surgeon: Radiologist, Medication, MD;  Location: [facility] OR;  Service: Radiology;  Laterality: N/A;   TEE WITHOUT CARDIOVERSION N/A 10/14/2023   Procedure: TRANSESOPHAGEAL ECHOCARDIOGRAM (TEE);  Surgeon: Antonieta Iba, MD;  Location: [facility] ORS;  Service: Cardiovascular;  Laterality: N/A;   TUBAL LIGATION  1978   vaginal deliveries     x2    MEDICATIONS: Current Outpatient Medications on  File Prior to Visit  Medication Sig Dispense Refill   aspirin EC 81 MG tablet Take 1 tablet (81 mg total) by mouth daily. Swallow whole. 30 tablet 12   Biotin 2500 MCG CAPS Take 2,500 mcg by mouth 2 (two) times daily. (Patient not taking: Reported on 10/19/2023)     CALCIUM PO Take 750 mg by mouth 2 (two) times daily.     Cholecalciferol (VITAMIN D PO) Take 500 Units by mouth 2 (two) times daily.     clopidogrel (PLAVIX) 75 MG tablet Take 1 tablet (75 mg total) by mouth daily. 30 tablet 1   Cyanocobalamin (VITAMIN B 12 PO) Take 1 tablet by mouth daily.     glipiZIDE (GLUCOTROL XL) 5 MG 24 hr tablet Take 5-10 mg by mouth See admin instructions. Take 10 mg by mouth in the morning and 5 mg in the evening     glucose blood test strip 1 each by Other route as directed. Use as instructed     HYDROcodone-acetaminophen (NORCO/VICODIN) 5-325 MG tablet Take 1 tablet by mouth every 6 (six) hours as needed for moderate pain (pain score 4-6). (Patient not taking: Reported on 10/19/2023)     HYDROmorphone (DILAUDID) 4 MG tablet Take 4 mg by mouth every 6 (six) hours as needed for moderate pain (pain score 4-6). (Patient not taking: Reported on 10/19/2023)     insulin glargine (LANTUS SOLOSTAR) 100 UNIT/ML Solostar Pen Inject 35 Units into the skin daily. 15 mL 11   ketoconazole (NIZORAL) 2 % cream Apply to skin folds once daily, twice daily with flares. (Patient not taking: Reported on 10/19/2023) 60 g 5   Lancet Devices (ACCU-CHEK SOFTCLIX) lancets 1 each by Other route as needed. Use as instructed     lisinopril (ZESTRIL) 20 MG tablet Take 20 mg by mouth daily.     magnesium oxide (MAG-OX) 400 MG tablet Take 400 mg by mouth daily.     metFORMIN (GLUCOPHAGE-XR) 500 MG 24 hr tablet Take 1,000 mg by mouth 2 (two) times daily.  1   metoprolol tartrate (LOPRESSOR) 25 MG tablet Take 1 tablet (25 mg total) by mouth 2 (two) times daily. 30 tablet 0   pantoprazole (PROTONIX) 40 MG tablet Take 40 mg by mouth 2 (two) times  daily.     rosuvastatin (CRESTOR) 40 MG tablet Take 1 tablet (40 mg total) by mouth daily. 30 tablet 0   No current facility-administered medications on file prior to visit.    ALLERGIES: Allergies  Allergen Reactions   Alendronate Nausea Only   Codeine Nausea Only   Prevacid [Lansoprazole] Other (See Comments)    hurts stomach   Sulfonamide Derivatives     Childhood reaction (doesn't remember)   Adhesive [Tape] Rash    Blisters if on too long    FAMILY HISTORY:  Family History  Problem Relation Age of Onset   Heart failure Mother    Coronary artery disease Mother    Heart attack Mother 72   Diabetes Mother    Heart failure Father    Coronary artery disease Father    Breast cancer Neg Hx     Objective:  Blood pressure 136/70, pulse 70, height 5\' 4"  (1.626 m), weight 165 lb 9.6 oz (75.1 kg), SpO2 95%. General: No acute distress.  Patient appears well-groomed.   Head:  Normocephalic/atraumatic Eyes:  fundi examined but not visualized Neck: supple, no paraspinal tenderness, full range of motion Heart: regular rate and rhythm Vascular: No carotid bruits. Neurological Exam: Mental status: alert and oriented to person, place, and time, speech fluent and not dysarthric, language intact. Cranial nerves: CN I: not tested CN II: pupils equal, round and reactive to light, visual fields intact CN III, IV, VI:  full range of motion, no nystagmus, no ptosis CN V: facial sensation intact. CN VII: upper and lower face symmetric CN VIII: hearing intact CN IX, X: gag intact, uvula midline CN XI: sternocleidomastoid and trapezius muscles intact CN XII: tongue midline Bulk & Tone: normal, no fasciculations. Motor:  muscle strength 5/5 throughout Sensation:  Pinprick  and vibratory sensation intact. Deep Tendon Reflexes:  1+ throughout,  toes downgoing.   Finger to nose testing:  Without dysmetria.   Heel to shin:  Without dysmetria.   Gait:  Mildly wide-based but steady.  Able to  turn.  Unable to tandem walk.  Romberg negative.    Thank you for allowing me to take part in the care of this patient.  Shon Millet, DO  CC:  Aram Beecham, MD  Thurmon Fair, MD

## 2023-10-27 ENCOUNTER — Encounter: Payer: Self-pay | Admitting: Neurology

## 2023-10-27 ENCOUNTER — Ambulatory Visit: Payer: PPO | Admitting: Neurology

## 2023-10-27 VITALS — BP 136/70 | HR 70 | Ht 64.0 in | Wt 165.6 lb

## 2023-10-27 DIAGNOSIS — E785 Hyperlipidemia, unspecified: Secondary | ICD-10-CM | POA: Diagnosis not present

## 2023-10-27 DIAGNOSIS — Z794 Long term (current) use of insulin: Secondary | ICD-10-CM | POA: Diagnosis not present

## 2023-10-27 DIAGNOSIS — I10 Essential (primary) hypertension: Secondary | ICD-10-CM | POA: Diagnosis not present

## 2023-10-27 DIAGNOSIS — I6310 Cerebral infarction due to embolism of unspecified precerebral artery: Secondary | ICD-10-CM | POA: Diagnosis not present

## 2023-10-27 DIAGNOSIS — E1159 Type 2 diabetes mellitus with other circulatory complications: Secondary | ICD-10-CM

## 2023-10-27 NOTE — Patient Instructions (Signed)
Continue aspirin 81mg  daily Continue clopidogrel 75mg  daily until end of January, then continue aspirin 81mg  daily alone Continue statin Continue physical therapy

## 2023-10-29 DIAGNOSIS — I69320 Aphasia following cerebral infarction: Secondary | ICD-10-CM | POA: Diagnosis not present

## 2023-10-29 DIAGNOSIS — I499 Cardiac arrhythmia, unspecified: Secondary | ICD-10-CM | POA: Diagnosis not present

## 2023-10-29 DIAGNOSIS — E785 Hyperlipidemia, unspecified: Secondary | ICD-10-CM | POA: Diagnosis not present

## 2023-10-29 DIAGNOSIS — Z87891 Personal history of nicotine dependence: Secondary | ICD-10-CM | POA: Diagnosis not present

## 2023-10-29 DIAGNOSIS — E1121 Type 2 diabetes mellitus with diabetic nephropathy: Secondary | ICD-10-CM | POA: Diagnosis not present

## 2023-10-29 DIAGNOSIS — I10 Essential (primary) hypertension: Secondary | ICD-10-CM | POA: Diagnosis not present

## 2023-10-29 DIAGNOSIS — K219 Gastro-esophageal reflux disease without esophagitis: Secondary | ICD-10-CM | POA: Diagnosis not present

## 2023-10-29 DIAGNOSIS — M810 Age-related osteoporosis without current pathological fracture: Secondary | ICD-10-CM | POA: Diagnosis not present

## 2023-10-29 DIAGNOSIS — K760 Fatty (change of) liver, not elsewhere classified: Secondary | ICD-10-CM | POA: Diagnosis not present

## 2023-11-03 ENCOUNTER — Ambulatory Visit (INDEPENDENT_AMBULATORY_CARE_PROVIDER_SITE_OTHER): Payer: PPO

## 2023-11-03 DIAGNOSIS — I639 Cerebral infarction, unspecified: Secondary | ICD-10-CM

## 2023-11-09 LAB — CUP PACEART REMOTE DEVICE CHECK
Date Time Interrogation Session: 20241226224808
Implantable Pulse Generator Implant Date: 20241030

## 2023-11-25 DIAGNOSIS — K219 Gastro-esophageal reflux disease without esophagitis: Secondary | ICD-10-CM | POA: Diagnosis not present

## 2023-11-25 DIAGNOSIS — K760 Fatty (change of) liver, not elsewhere classified: Secondary | ICD-10-CM | POA: Diagnosis not present

## 2023-11-25 DIAGNOSIS — E1121 Type 2 diabetes mellitus with diabetic nephropathy: Secondary | ICD-10-CM | POA: Diagnosis not present

## 2023-11-25 DIAGNOSIS — I10 Essential (primary) hypertension: Secondary | ICD-10-CM | POA: Diagnosis not present

## 2023-11-25 DIAGNOSIS — I69320 Aphasia following cerebral infarction: Secondary | ICD-10-CM | POA: Diagnosis not present

## 2023-11-25 DIAGNOSIS — M810 Age-related osteoporosis without current pathological fracture: Secondary | ICD-10-CM | POA: Diagnosis not present

## 2023-11-25 DIAGNOSIS — E785 Hyperlipidemia, unspecified: Secondary | ICD-10-CM | POA: Diagnosis not present

## 2023-12-04 DIAGNOSIS — E118 Type 2 diabetes mellitus with unspecified complications: Secondary | ICD-10-CM | POA: Diagnosis not present

## 2023-12-04 DIAGNOSIS — M810 Age-related osteoporosis without current pathological fracture: Secondary | ICD-10-CM | POA: Diagnosis not present

## 2023-12-04 DIAGNOSIS — Z79899 Other long term (current) drug therapy: Secondary | ICD-10-CM | POA: Diagnosis not present

## 2023-12-04 DIAGNOSIS — R8290 Unspecified abnormal findings in urine: Secondary | ICD-10-CM | POA: Diagnosis not present

## 2023-12-04 DIAGNOSIS — I10 Essential (primary) hypertension: Secondary | ICD-10-CM | POA: Diagnosis not present

## 2023-12-04 DIAGNOSIS — R809 Proteinuria, unspecified: Secondary | ICD-10-CM | POA: Diagnosis not present

## 2023-12-04 DIAGNOSIS — E1129 Type 2 diabetes mellitus with other diabetic kidney complication: Secondary | ICD-10-CM | POA: Diagnosis not present

## 2023-12-04 DIAGNOSIS — K861 Other chronic pancreatitis: Secondary | ICD-10-CM | POA: Diagnosis not present

## 2023-12-04 DIAGNOSIS — R42 Dizziness and giddiness: Secondary | ICD-10-CM | POA: Diagnosis not present

## 2023-12-04 DIAGNOSIS — Z0000 Encounter for general adult medical examination without abnormal findings: Secondary | ICD-10-CM | POA: Diagnosis not present

## 2023-12-04 DIAGNOSIS — Z794 Long term (current) use of insulin: Secondary | ICD-10-CM | POA: Diagnosis not present

## 2023-12-04 DIAGNOSIS — E782 Mixed hyperlipidemia: Secondary | ICD-10-CM | POA: Diagnosis not present

## 2023-12-07 ENCOUNTER — Ambulatory Visit (INDEPENDENT_AMBULATORY_CARE_PROVIDER_SITE_OTHER): Payer: PPO

## 2023-12-07 DIAGNOSIS — I639 Cerebral infarction, unspecified: Secondary | ICD-10-CM | POA: Diagnosis not present

## 2023-12-07 LAB — CUP PACEART REMOTE DEVICE CHECK
Date Time Interrogation Session: 20250126232826
Implantable Pulse Generator Implant Date: 20241030

## 2023-12-08 ENCOUNTER — Encounter: Payer: Self-pay | Admitting: Cardiology

## 2023-12-14 NOTE — Progress Notes (Signed)
 [facility] Summary Report / Loop Recorder

## 2023-12-14 NOTE — Addendum Note (Signed)
Addended by: Geralyn Flash D on: 12/14/2023 03:57 PM   Modules accepted: Orders

## 2023-12-15 ENCOUNTER — Other Ambulatory Visit: Payer: Self-pay

## 2023-12-15 NOTE — Patient Outreach (Signed)
 First telephone outreach attempt to obtain mRS. No answer. Left message for returned call.  [facility]  Population Health Care Management Assistant  Direct Dial: [phone]  Fax: [phone] Website: [email or web address]

## 2023-12-16 ENCOUNTER — Other Ambulatory Visit: Payer: Self-pay

## 2023-12-16 NOTE — Patient Outreach (Signed)
 Telephone outreach to patient to obtain mRS was successfully completed. MRS= 1  Vanice Sarah [facility] Health Care Management Assistant  Direct Dial: [phone]  Fax: [phone] Website: [email or web address]

## 2024-01-11 ENCOUNTER — Ambulatory Visit (INDEPENDENT_AMBULATORY_CARE_PROVIDER_SITE_OTHER): Payer: PPO

## 2024-01-11 DIAGNOSIS — I639 Cerebral infarction, unspecified: Secondary | ICD-10-CM

## 2024-01-12 ENCOUNTER — Encounter: Payer: Self-pay | Admitting: Cardiology

## 2024-01-12 LAB — CUP PACEART REMOTE DEVICE CHECK
Date Time Interrogation Session: 20250302232037
Implantable Pulse Generator Implant Date: 20241030

## 2024-01-13 NOTE — Progress Notes (Signed)
 [facility] Summary Report / Loop Recorder

## 2024-02-10 NOTE — Progress Notes (Signed)
 [facility] Summary Report / Loop Recorder

## 2024-02-10 NOTE — Addendum Note (Signed)
 Addended by: Geralyn Flash D on: 02/10/2024 04:40 PM   Modules accepted: Orders

## 2024-02-15 ENCOUNTER — Ambulatory Visit (INDEPENDENT_AMBULATORY_CARE_PROVIDER_SITE_OTHER): Payer: PPO

## 2024-02-15 DIAGNOSIS — I639 Cerebral infarction, unspecified: Secondary | ICD-10-CM | POA: Diagnosis not present

## 2024-02-16 LAB — CUP PACEART REMOTE DEVICE CHECK
Date Time Interrogation Session: 20250406232119
Implantable Pulse Generator Implant Date: 20241030

## 2024-02-18 DIAGNOSIS — E1129 Type 2 diabetes mellitus with other diabetic kidney complication: Secondary | ICD-10-CM | POA: Diagnosis not present

## 2024-02-18 DIAGNOSIS — Z794 Long term (current) use of insulin: Secondary | ICD-10-CM | POA: Diagnosis not present

## 2024-02-18 DIAGNOSIS — R809 Proteinuria, unspecified: Secondary | ICD-10-CM | POA: Diagnosis not present

## 2024-02-20 ENCOUNTER — Encounter: Payer: Self-pay | Admitting: Cardiology

## 2024-02-25 DIAGNOSIS — K760 Fatty (change of) liver, not elsewhere classified: Secondary | ICD-10-CM | POA: Diagnosis not present

## 2024-02-25 DIAGNOSIS — N1831 Chronic kidney disease, stage 3a: Secondary | ICD-10-CM | POA: Diagnosis not present

## 2024-02-25 DIAGNOSIS — M810 Age-related osteoporosis without current pathological fracture: Secondary | ICD-10-CM | POA: Diagnosis not present

## 2024-02-25 DIAGNOSIS — Z794 Long term (current) use of insulin: Secondary | ICD-10-CM | POA: Diagnosis not present

## 2024-02-25 DIAGNOSIS — E1129 Type 2 diabetes mellitus with other diabetic kidney complication: Secondary | ICD-10-CM | POA: Diagnosis not present

## 2024-02-25 DIAGNOSIS — I10 Essential (primary) hypertension: Secondary | ICD-10-CM | POA: Diagnosis not present

## 2024-02-25 DIAGNOSIS — E113293 Type 2 diabetes mellitus with mild nonproliferative diabetic retinopathy without macular edema, bilateral: Secondary | ICD-10-CM | POA: Diagnosis not present

## 2024-02-25 DIAGNOSIS — Z0000 Encounter for general adult medical examination without abnormal findings: Secondary | ICD-10-CM | POA: Diagnosis not present

## 2024-02-25 DIAGNOSIS — R809 Proteinuria, unspecified: Secondary | ICD-10-CM | POA: Diagnosis not present

## 2024-02-25 DIAGNOSIS — E1165 Type 2 diabetes mellitus with hyperglycemia: Secondary | ICD-10-CM | POA: Diagnosis not present

## 2024-02-25 DIAGNOSIS — E118 Type 2 diabetes mellitus with unspecified complications: Secondary | ICD-10-CM | POA: Diagnosis not present

## 2024-02-25 DIAGNOSIS — Z1231 Encounter for screening mammogram for malignant neoplasm of breast: Secondary | ICD-10-CM | POA: Diagnosis not present

## 2024-02-25 DIAGNOSIS — Z79899 Other long term (current) drug therapy: Secondary | ICD-10-CM | POA: Diagnosis not present

## 2024-02-25 DIAGNOSIS — E782 Mixed hyperlipidemia: Secondary | ICD-10-CM | POA: Diagnosis not present

## 2024-02-25 DIAGNOSIS — K219 Gastro-esophageal reflux disease without esophagitis: Secondary | ICD-10-CM | POA: Diagnosis not present

## 2024-02-29 ENCOUNTER — Other Ambulatory Visit: Payer: Self-pay | Admitting: Internal Medicine

## 2024-02-29 DIAGNOSIS — Z1231 Encounter for screening mammogram for malignant neoplasm of breast: Secondary | ICD-10-CM

## 2024-03-21 ENCOUNTER — Ambulatory Visit (INDEPENDENT_AMBULATORY_CARE_PROVIDER_SITE_OTHER): Payer: PPO

## 2024-03-21 DIAGNOSIS — I639 Cerebral infarction, unspecified: Secondary | ICD-10-CM

## 2024-03-21 LAB — CUP PACEART REMOTE DEVICE CHECK
Date Time Interrogation Session: 20250511234807
Implantable Pulse Generator Implant Date: 20241030

## 2024-03-25 DIAGNOSIS — J40 Bronchitis, not specified as acute or chronic: Secondary | ICD-10-CM | POA: Diagnosis not present

## 2024-03-27 ENCOUNTER — Ambulatory Visit: Payer: Self-pay | Admitting: Cardiology

## 2024-03-28 NOTE — Progress Notes (Signed)
 [facility] Summary Report / Loop Recorder

## 2024-03-28 NOTE — Addendum Note (Signed)
 Addended by: Edra Govern D on: 03/28/2024 09:55 AM   Modules accepted: Orders

## 2024-04-21 ENCOUNTER — Ambulatory Visit (INDEPENDENT_AMBULATORY_CARE_PROVIDER_SITE_OTHER)

## 2024-04-21 DIAGNOSIS — I639 Cerebral infarction, unspecified: Secondary | ICD-10-CM

## 2024-04-21 LAB — CUP PACEART REMOTE DEVICE CHECK
Date Time Interrogation Session: 20250611234035
Implantable Pulse Generator Implant Date: 20241030

## 2024-04-23 ENCOUNTER — Ambulatory Visit: Payer: Self-pay | Admitting: Cardiology

## 2024-04-25 NOTE — Progress Notes (Unsigned)
 NEUROLOGY FOLLOW UP OFFICE NOTE  Molly [name] [id number]  Assessment/Plan:   Left hemispheric territory infarcts, appears embolic of unknown source but suspicious for cardiac Hypertension Hyperlipidemia Type 2 diabetes mellitus Dizziness - I am not convinced that the third admission earlier this month was a stroke.  MRI was negative but she did not exhibit any clear lateralizing symptoms like the first 2 events - nonspecific unsteadiness, generalized weakness and dizziness.  It may have been a peripheral vestibulopathy.     Secondary stroke prevention as managed by PCP/cardiology: ASA 81mg  daily Statin.  LDL goal less than 70 Normotensive blood pressure Hgb A1c goal less than 7 Monitor for a fib via loop recorder Mediterranean diet Vestibular rehab as ordered by PCP. Follow up 6 months.   Subjective:  Molly [name] is an 83 year old right-handed with HTN, HLD, non-alcoholic fatty liver disease, DM 2, IBS and history of basal cell carcinoma who follows up for strokes.  UPDATE: Current medications:  ***  ***  HISTORY: Patient has had recurrent acute strokes since October 2024.  On 10/21, she developed sudden onset right sided facial droop, right sided weakness and aphasia.  She presented to [facility] where she received TNK.  MRI of brain revealed patchy acute infarcts within the left MCA and bilateral ACA territories.  CTA head and neck revealed distal left M1 and proximal superior and inferior M2 occlusions.  Underwent mechanical thrombectomy resulting in patent left MCA.  2D echo showed EF 60-65% with negative bubble study.  LDL 63 and Hgb A1c was 6.7.  She was not on antithrombotic therapy and was discharged on ASA and Plavix  for 3 months followed by ASA alone.  Due to involvement of bilateral ACA territories.  Discharged on 10/27 but next day she had recurrence of aphasia but also endorsed headache, left sided arm tingling and chest pain.  Admitted to [facility].  MRI of brain showed news  acute infarct at the left centrum semiovale.  EP consulted and underwent loop recorder implant.  EEG showed cortical dysfunction in the left frontotemporal region likely secondary to underlying stroke but study was negative for epileptiform discharges.  Maintained on DAPT and changed from simvastatin  to rosuvastatin .  Following discharge on 10/30, she experienced generalized weakness.  She started feeling more dizzy.  Outpatient CT head on 11/29 demonstrated new lacunar infarct of the left caudate head.  Readmitted to [facility] on 12/2 for further evaluation.  MRI of brain showed evidence of known subacute infarcts in the left caudate tail and left frontal lobe but no evidence of new infarcts.  TEE on 12/4 revealed EF 55-60% and severely dilated left atrium with no evidence of left atrial appendate thrombus, significant valvular disease or PFO.  She sill feels a little dizzy and will be starting physical therapy.   08/31/2023 CTA HEAD & NECK:  1. The distal left M1 segment/proximal superior and inferior M2 segments are now occluded for a short-segment with diminished collateral flow throughout the distal MCA territory. 2. No evidence of evolved infarct in the left MCA distribution or intracranial hemorrhage. 09/01/2023 MRI BRAIN WO:  Acute left MCA and ACA territory infarcts involving the left frontal lobe, left parietal lobe, and the body and head of the caudate on the left. No hemorrhage. 09/01/2023 CTA HEAD & NECK:  1. No intracranial arterial occlusion or high-grade stenosis. The left middle cerebral artery is now patent. 2. Unchanged appearance of the left vertebral artery which terminates in PICA and may be occluded  proximally. 09/07/2023 MRI BRAIN WO: 1. Much of the previously seen abnormal diffusion restriction has resolved. Persistent diffusion restriction of the left precentral gyrus, caudate tail and medial frontal gyrus. 2. New focus of acute ischemia at the left centrum semiovale. 3. Petechial blood  products at the superior left hemisphere  09/07/2023 CTA HEAD & NECK:  1. No large vessel occlusion or hemodynamically significant stenosis in the head or neck. 2. Mild atherosclerosis of the carotid arteries. 10/12/2023 MRI BRAIN WO:  1. Further diminishment in diffusion restriction in the left caudate tail and left frontal lobe, with resolution of the previously noted ADC correlates, compatible with subacute infarcts. 2. No evidence of additional acute or subacute infarct that was not present on the prior exam. 3. Redemonstrated petechial hemorrhage in the superior left medial frontal lobe. No new hemosiderin deposition.  PAST MEDICAL HISTORY: Past Medical History:  Diagnosis Date   Arrhythmia    Arthritis    Basal cell carcinoma    right neck, right forehead - treated in the past   Cancer (HCC)    skin   CMV (cytomegalovirus infection) (HCC)    Colon polyps    Diabetes mellitus, type 2 (HCC)    Duodenitis    Dysrhythmia    Fatty liver disease, nonalcoholic    FH: colonic polyps    hx of-adenomatous   GERD (gastroesophageal reflux disease)    Heart murmur    dx'd in 1960s. followed by PCP   Hemorrhoid .   Hyperlipidemia    Hypertension    IBS (irritable bowel syndrome)    Osteoporosis    Pancreatitis    drug induced    MEDICATIONS: Current Outpatient Medications on File Prior to Visit  Medication Sig Dispense Refill   aspirin  EC 81 MG tablet Take 1 tablet (81 mg total) by mouth daily. Swallow whole. 30 tablet 12   Biotin 2500 MCG CAPS Take 2,500 mcg by mouth 2 (two) times daily.     CALCIUM  PO Take 750 mg by mouth 2 (two) times daily.     Cholecalciferol (VITAMIN D  PO) Take 500 Units by mouth 2 (two) times daily.     Cyanocobalamin  (VITAMIN B 12 PO) Take 1 tablet by mouth daily.     glipiZIDE  (GLUCOTROL  XL) 5 MG 24 hr tablet Take 5-10 mg by mouth See admin instructions. Take 10 mg by mouth in the morning and 5 mg in the evening     glucose blood test strip 1 each by Other  route as directed. Use as instructed     HYDROcodone-acetaminophen  (NORCO/VICODIN) 5-325 MG tablet Take 1 tablet by mouth every 6 (six) hours as needed for moderate pain (pain score 4-6).     HYDROmorphone  (DILAUDID ) 4 MG tablet Take 4 mg by mouth every 6 (six) hours as needed for moderate pain (pain score 4-6).     insulin  glargine (LANTUS  SOLOSTAR) 100 UNIT/ML Solostar Pen Inject 35 Units into the skin daily. 15 mL 11   ketoconazole  (NIZORAL ) 2 % cream Apply to skin folds once daily, twice daily with flares. 60 g 5   Lancet Devices (ACCU-CHEK SOFTCLIX) lancets 1 each by Other route as needed. Use as instructed     lisinopril  (ZESTRIL ) 20 MG tablet Take 20 mg by mouth daily.     magnesium  oxide (MAG-OX) 400 MG tablet Take 400 mg by mouth daily.     metFORMIN  (GLUCOPHAGE -XR) 500 MG 24 hr tablet Take 1,000 mg by mouth 2 (two) times daily.  1  metoprolol  tartrate (LOPRESSOR ) 25 MG tablet Take 1 tablet (25 mg total) by mouth 2 (two) times daily. 30 tablet 0   pantoprazole  (PROTONIX ) 40 MG tablet Take 40 mg by mouth 2 (two) times daily.     rosuvastatin  (CRESTOR ) 40 MG tablet Take 1 tablet (40 mg total) by mouth daily. 30 tablet 0   No current facility-administered medications on file prior to visit.    ALLERGIES: Allergies  Allergen Reactions   Alendronate Nausea Only   Codeine Nausea Only   Prevacid [Lansoprazole] Other (See Comments)    hurts stomach   Sulfonamide Derivatives     Childhood reaction (doesn't remember)   Adhesive [Tape] Rash    Blisters if on too long    FAMILY HISTORY: Family History  Problem Relation Age of Onset   Heart failure Mother    Coronary artery disease Mother    Heart attack Mother 32   Diabetes Mother    Heart failure Father    Coronary artery disease Father    Breast cancer Neg Hx       Objective:  *** General: No acute distress.  Patient appears ***-groomed.   Head:  Normocephalic/atraumatic Eyes:  Fundi examined but not visualized Neck:  supple, no paraspinal tenderness, full range of motion Heart:  Regular rate and rhythm Lungs:  Clear to auscultation bilaterally Back: No paraspinal tenderness Neurological Exam: alert and oriented.  Speech fluent and not dysarthric, language intact.  CN II-XII intact. Bulk and tone normal, muscle strength 5/5 throughout.  Sensation to light touch intact.  Deep tendon reflexes 2+ throughout, toes downgoing.  Finger to nose testing intact.  Gait normal, Romberg negative.   Janne Members, DO  CC: ***

## 2024-04-26 ENCOUNTER — Ambulatory Visit: Payer: PPO | Admitting: Neurology

## 2024-04-26 ENCOUNTER — Encounter: Payer: Self-pay | Admitting: Neurology

## 2024-04-26 VITALS — BP 148/72 | HR 65 | Ht 65.0 in | Wt 166.0 lb

## 2024-04-26 DIAGNOSIS — E785 Hyperlipidemia, unspecified: Secondary | ICD-10-CM | POA: Diagnosis not present

## 2024-04-26 DIAGNOSIS — I6310 Cerebral infarction due to embolism of unspecified precerebral artery: Secondary | ICD-10-CM | POA: Diagnosis not present

## 2024-04-26 DIAGNOSIS — I10 Essential (primary) hypertension: Secondary | ICD-10-CM

## 2024-04-26 DIAGNOSIS — E1159 Type 2 diabetes mellitus with other circulatory complications: Secondary | ICD-10-CM

## 2024-04-26 DIAGNOSIS — Z794 Long term (current) use of insulin: Secondary | ICD-10-CM

## 2024-04-26 NOTE — Patient Instructions (Signed)
 Continue aspirin  81mg  daily Continue rosuvastatin  Continue blood pressure medication Continue diabetes medication

## 2024-05-04 NOTE — Progress Notes (Signed)
 [facility] Summary Report / Loop Recorder

## 2024-05-23 ENCOUNTER — Ambulatory Visit (INDEPENDENT_AMBULATORY_CARE_PROVIDER_SITE_OTHER)

## 2024-05-23 ENCOUNTER — Ambulatory Visit: Payer: Self-pay | Admitting: Cardiology

## 2024-05-23 DIAGNOSIS — R809 Proteinuria, unspecified: Secondary | ICD-10-CM | POA: Diagnosis not present

## 2024-05-23 DIAGNOSIS — I10 Essential (primary) hypertension: Secondary | ICD-10-CM | POA: Diagnosis not present

## 2024-05-23 DIAGNOSIS — I639 Cerebral infarction, unspecified: Secondary | ICD-10-CM

## 2024-05-23 DIAGNOSIS — E118 Type 2 diabetes mellitus with unspecified complications: Secondary | ICD-10-CM | POA: Diagnosis not present

## 2024-05-23 DIAGNOSIS — E1129 Type 2 diabetes mellitus with other diabetic kidney complication: Secondary | ICD-10-CM | POA: Diagnosis not present

## 2024-05-23 DIAGNOSIS — Z79899 Other long term (current) drug therapy: Secondary | ICD-10-CM | POA: Diagnosis not present

## 2024-05-23 DIAGNOSIS — E782 Mixed hyperlipidemia: Secondary | ICD-10-CM | POA: Diagnosis not present

## 2024-05-23 DIAGNOSIS — Z794 Long term (current) use of insulin: Secondary | ICD-10-CM | POA: Diagnosis not present

## 2024-05-23 LAB — CUP PACEART REMOTE DEVICE CHECK
Date Time Interrogation Session: 20250714003846
Implantable Pulse Generator Implant Date: 20241030

## 2024-05-26 ENCOUNTER — Ambulatory Visit
Admission: RE | Admit: 2024-05-26 | Discharge: 2024-05-26 | Disposition: A | Discharge status: Home / Self Care | Source: Ambulatory Visit | Attending: Internal Medicine | Admitting: Internal Medicine

## 2024-05-26 DIAGNOSIS — Z1231 Encounter for screening mammogram for malignant neoplasm of breast: Secondary | ICD-10-CM | POA: Diagnosis not present

## 2024-05-30 DIAGNOSIS — E118 Type 2 diabetes mellitus with unspecified complications: Secondary | ICD-10-CM | POA: Diagnosis not present

## 2024-05-30 DIAGNOSIS — R0602 Shortness of breath: Secondary | ICD-10-CM | POA: Diagnosis not present

## 2024-05-30 DIAGNOSIS — I10 Essential (primary) hypertension: Secondary | ICD-10-CM | POA: Diagnosis not present

## 2024-05-30 DIAGNOSIS — K861 Other chronic pancreatitis: Secondary | ICD-10-CM | POA: Diagnosis not present

## 2024-05-30 DIAGNOSIS — E782 Mixed hyperlipidemia: Secondary | ICD-10-CM | POA: Diagnosis not present

## 2024-05-30 DIAGNOSIS — E1129 Type 2 diabetes mellitus with other diabetic kidney complication: Secondary | ICD-10-CM | POA: Diagnosis not present

## 2024-05-30 DIAGNOSIS — M810 Age-related osteoporosis without current pathological fracture: Secondary | ICD-10-CM | POA: Diagnosis not present

## 2024-05-30 DIAGNOSIS — E113293 Type 2 diabetes mellitus with mild nonproliferative diabetic retinopathy without macular edema, bilateral: Secondary | ICD-10-CM | POA: Diagnosis not present

## 2024-05-30 DIAGNOSIS — R002 Palpitations: Secondary | ICD-10-CM | POA: Diagnosis not present

## 2024-05-30 DIAGNOSIS — Z794 Long term (current) use of insulin: Secondary | ICD-10-CM | POA: Diagnosis not present

## 2024-05-30 DIAGNOSIS — R809 Proteinuria, unspecified: Secondary | ICD-10-CM | POA: Diagnosis not present

## 2024-06-08 DIAGNOSIS — R002 Palpitations: Secondary | ICD-10-CM | POA: Diagnosis not present

## 2024-06-13 NOTE — Progress Notes (Signed)
 [facility] Summary Report / Loop Recorder

## 2024-06-21 DIAGNOSIS — R002 Palpitations: Secondary | ICD-10-CM | POA: Diagnosis not present

## 2024-06-21 DIAGNOSIS — R0602 Shortness of breath: Secondary | ICD-10-CM | POA: Diagnosis not present

## 2024-06-22 NOTE — Progress Notes (Signed)
 Cardiology Office Note   Date:  06/24/2024  ID:  Molly [name], DOB Jan 13, 1941, MRN [id number] PCP: Auston Reyes BIRCH, MD  [facility] Providers Cardiologist:  OLE ONEIDA HOLTS, MD    History of Present Illness Molly [name] is a 83 y.o. female with a past medical history of 2 strokes in 2024 (1 on 08/31/2023 and the second 09/07/2023.  Her first stroke was left centrum semiovale, transient right hemiparesis.  She was then started on aspirin  and Plavix  and second event was a new lacunar infarct of the left caudate head, generalized weakness and sensory ataxia.  She recovered well without neurologic sequelae.  Additional problems include type 2 diabetes mellitus, dyslipidemia, hypertension, osteoporosis with thoracic and lumbar spine compression fractures, GERD, and remote history of drug induced acute pancreatitis.  Cardiac workup included transthoracic and transesophageal echocardiogram which showed no cardioembolic source of stroke, known moderate mitral valve insufficiency, severe left atrial dilation without visible clot and moderate atherosclerosis of the aortic arch.  She received an implantable loop recorder 09/09/2023.  Underwent carotid CT angiography which showed no evidence of large vessel stenosis and only mild carotid atherosclerosis.  Left vertebral artery may be occluded proximally.  CT angiography when she initially presented with stroke did show occlusion of distal M1 segment and proximal superior and inferior M2 branches.  Today, she presents with hx of strokes for evaluation of heart rhythm concerns.  She experienced two strokes in October of the previous year, with extensive testing failing to reveal a cause. A loop recorder was implanted to monitor for atrial fibrillation. Recent episodes of heart rhythm irregularities include 'skipped beats' and chest heaviness. An echocardiogram and heart monitor showed stable results with longstanding valvular issues, including a  moderate mitral valve leak. The loop recorder has not detected atrial fibrillation, tachycardia, or bradycardia, but noted PVCs less than 1% of the time.  She experiences chronic swelling in her feet, more pronounced on the side of an old orthopedic injury. She uses compression hose in the winter. Blood pressure is typically well-controlled at home but was elevated during the visit, possibly due to 'white coat syndrome'. She takes half a dose of lisinopril  due to improved kidney function, with noted increased ankle swelling since the dose reduction.  She has a history of pancreatitis linked to Jardiance, now discontinued, and manages diabetes with fast-acting insulin  before dinner, stabilizing blood sugar levels between 80 and 121. She is concerned about high triglyceride levels, possibly due to uncontrolled blood sugar, and is under endocrinology care for diabetes management.  Reports no shortness of breath nor dyspnea on exertion. Reports no chest pain, pressure, or tightness. No edema, orthopnea, PND. Reports no palpitations.   Discussed the use of AI scribe software for clinical note transcription with the patient, who gave verbal consent to proceed.  ROS: pertinent ROS in HPI  Studies Reviewed      NORMAL LEFT VENTRICULAR SYSTOLIC FUNCTION  NORMAL RIGHT VENTRICULAR SYSTOLIC FUNCTION  Moderate LAE  Moderate Mitral valve Regurgitation  Mild Tricuspid and Aortic valve regurgitation  Trivial Pulmonic valve regurgitation  Study unchanged from 2019    Echocardiogram 09/01/2023    1. Left ventricular ejection fraction, by estimation, is 60 to 65%. The  left ventricle has normal function. The left ventricle has no regional  wall motion abnormalities. Left ventricular diastolic parameters are  indeterminate.   2. Right ventricular systolic function is normal. The right ventricular  size is normal.   3. The mitral valve is degenerative.  Moderate mitral valve regurgitation.  No evidence of  mitral stenosis. Moderate mitral annular calcification.   4. The aortic valve is normal in structure. Aortic valve regurgitation is  mild to moderate. No aortic stenosis is present.   5. The inferior vena cava is normal in size with greater than 50%  respiratory variability, suggesting right atrial pressure of 3 mmHg.   6. Agitated saline contrast bubble study was negative, with no evidence  of any interatrial shunt.   Conclusion(s)/Recommendation(s): No intracardiac source of embolism  detected on this transthoracic study. Consider a transesophageal  echocardiogram to exclude cardiac source of embolism if clinically  indicated.    Transesophageal echocardiogram 10/14/2023     1. Left ventricular ejection fraction, by estimation, is 55 to 60%. The  left ventricle has normal function. The left ventricle has no regional  wall motion abnormalities.   2. Right ventricular systolic function is normal. The right ventricular  size is normal.   3. Left atrial size was severely dilated. No left atrial/left atrial  appendage thrombus was detected.   4. The mitral valve is normal in structure. Moderate mitral valve  regurgitation. No evidence of mitral stenosis.   5. The aortic valve is calcified. There is moderate calcification of the  aortic valve. Aortic valve regurgitation is not visualized. Aortic valve  sclerosis/calcification is present, without any evidence of aortic  stenosis.   6. There is Moderate (Grade III) atheroma plaque involving the ascending  aorta, aortic arch and descending aorta.   7. The inferior vena cava is normal in size with greater than 50%  respiratory variability, suggesting right atrial pressure of 3 mmHg.   8. Agitated saline contrast bubble study was negative, with no evidence  of any interatrial shunt.   Conclusion(s)/Recommendation(s): Normal biventricular function without  evidence of hemodynamically significant valvular heart disease.       Physical  Exam VS:  BP (!) 148/76   Pulse 62   Ht 5' 4 (1.626 m)   Wt 167 lb (75.8 kg)   SpO2 97%   BMI 28.67 kg/m        Wt Readings from Last 3 Encounters:  06/24/24 167 lb (75.8 kg)  04/26/24 166 lb (75.3 kg)  10/27/23 165 lb 9.6 oz (75.1 kg)    GEN: Well nourished, well developed in no acute distress NECK: No JVD; No carotid bruits CARDIAC: RRR, no murmurs, rubs, gallops RESPIRATORY:  Clear to auscultation without rales, wheezing or rhonchi  ABDOMEN: Soft, non-tender, non-distended EXTREMITIES:  No edema; No deformity   ASSESSMENT AND PLAN  Paroxysmal ventricular and atrial premature beats (PVCs/PACs) Intermittent PVCs <1% on loop recorder, normal. No AFib, tachycardia, bradycardia, or pauses. Possible exacerbation by dehydration or caffeine. - Advise to avoid caffeine and alcohol. - Encourage hydration with at least 64 ounces of water daily. - Report any recurrence of chest heaviness or skipped beats. - Send message to Dr. Cindie to review loop recorder report and discuss findings.  Mitral valve regurgitation, moderate Moderate mitral valve regurgitation, stable. No significant echocardiogram changes or symptoms. - Order annual echocardiogram to monitor mitral valve regurgitation.  Aortic valve stenosis and regurgitation, mild Mild aortic valve stenosis and regurgitation, stable. No significant echocardiogram changes or symptoms.  Tricuspid and pulmonic valve regurgitation, trivial Trivial regurgitation, stable. No significant echocardiogram changes.  Diastolic dysfunction, grade 1 Grade 1 diastolic dysfunction, stable. Common with aging.  Hypertension Slightly elevated office BP, likely white coat syndrome. Home readings acceptable. Lisinopril  dose reduced due to improved  kidney function. - Monitor blood pressure at home and report if consistently in 140s-150s. - Continue current antihypertensive regimen.  Type 2 diabetes mellitus Blood sugar stabilizing with  fast-acting insulin . Metformin  efficacy decreasing. Jardiance previously caused pancreatitis. - Continue fast-acting insulin  before dinner. - Discuss potential changes to diabetes medication regimen with endocrinologist.  Hypertriglyceridemia Elevated triglycerides likely due to uncontrolled blood sugar. Stabilizing with insulin . - Order lipid panel with upcoming lab work through Dr. Auston.  Ischemic stroke Two ischemic strokes last October. On Plavix  and aspirin . No new neurological symptoms. - Continue Plavix  and aspirin  therapy.  Carotid bruit Carotid bruit evaluated with CT angiogram, no significant stenosis. Likely due to aortic valve stenosis echoing.  Lower extremity edema Chronic edema likely secondary to old tendon injury. Persistent swelling, especially in the morning. - Advise use of compression hose, especially when on feet for extended periods. - Encourage elevation of legs when possible.  Pancreatitis due to SGLT2 inhibitor (Jardiance) Pancreatitis associated with Jardiance. No current SGLT2 inhibitor use. - Avoid SGLT2 inhibitors in future treatment plans.      Dispo: follow-up with Dr. Cindie in 3 months and Dr. Francyne in 1 year  Signed, Orren LOISE Fabry, PA-C

## 2024-06-22 NOTE — Progress Notes (Unsigned)
 Cardiology Office Note   Date:  06/22/2024  ID:  Molly [name], DOB 03/07/1941, MRN [id number] PCP: Auston Reyes BIRCH, MD  [facility] Providers Cardiologist:  OLE ONEIDA HOLTS, MD    History of Present Illness Molly [name] is a 83 y.o. female with a past medical history of 2 strokes in 2024 (1 on 09/07/2023 and the second 10/12/2023.  Her first stroke was left centrum semiovale, transient right hemiparesis.  She was then started on aspirin  and Plavix  and second event was a new lacunar infarct of the left caudate head, generalized weakness and sensory ataxia.  She recovered well without neurologic sequelae.  Additional problems include type 2 diabetes mellitus, dyslipidemia, hypertension, osteoporosis with thoracic and lumbar spine compression fractures, GERD, and remote history of drug induced acute pancreatitis.  Cardiac workup included transthoracic and transesophageal echocardiogram which showed no cardioembolic source of stroke, known moderate mitral valve insufficiency, severe left atrial dilation without visible clot and moderate atherosclerosis of the aortic arch.  She received an implantable loop recorder 09/09/2023.  Underwent carotid CT angiography which showed no evidence of large vessel stenosis and only mild carotid atherosclerosis.  Left vertebral artery may be occluded proximally.  CT angiography when she initially presented with stroke did show occlusion of distal M1 segment and proximal superior and inferior M2 branches.  Today, she***  ROS: ***  Studies Reviewed      NORMAL LEFT VENTRICULAR SYSTOLIC FUNCTION  NORMAL RIGHT VENTRICULAR SYSTOLIC FUNCTION  Moderate LAE  Moderate Mitral valve Regurgitation  Mild Tricuspid and Aortic valve regurgitation  Trivial Pulmonic valve regurgitation  Study unchanged from 2019    Echocardiogram 09/01/2023    1. Left ventricular ejection fraction, by estimation, is 60 to 65%. The  left ventricle has normal function. The left  ventricle has no regional  wall motion abnormalities. Left ventricular diastolic parameters are  indeterminate.   2. Right ventricular systolic function is normal. The right ventricular  size is normal.   3. The mitral valve is degenerative. Moderate mitral valve regurgitation.  No evidence of mitral stenosis. Moderate mitral annular calcification.   4. The aortic valve is normal in structure. Aortic valve regurgitation is  mild to moderate. No aortic stenosis is present.   5. The inferior vena cava is normal in size with greater than 50%  respiratory variability, suggesting right atrial pressure of 3 mmHg.   6. Agitated saline contrast bubble study was negative, with no evidence  of any interatrial shunt.   Conclusion(s)/Recommendation(s): No intracardiac source of embolism  detected on this transthoracic study. Consider a transesophageal  echocardiogram to exclude cardiac source of embolism if clinically  indicated.    Transesophageal echocardiogram 10/14/2023     1. Left ventricular ejection fraction, by estimation, is 55 to 60%. The  left ventricle has normal function. The left ventricle has no regional  wall motion abnormalities.   2. Right ventricular systolic function is normal. The right ventricular  size is normal.   3. Left atrial size was severely dilated. No left atrial/left atrial  appendage thrombus was detected.   4. The mitral valve is normal in structure. Moderate mitral valve  regurgitation. No evidence of mitral stenosis.   5. The aortic valve is calcified. There is moderate calcification of the  aortic valve. Aortic valve regurgitation is not visualized. Aortic valve  sclerosis/calcification is present, without any evidence of aortic  stenosis.   6. There is Moderate (Grade III) atheroma plaque involving the ascending  aorta, aortic  arch and descending aorta.   7. The inferior vena cava is normal in size with greater than 50%  respiratory variability,  suggesting right atrial pressure of 3 mmHg.   8. Agitated saline contrast bubble study was negative, with no evidence  of any interatrial shunt.   Conclusion(s)/Recommendation(s): Normal biventricular function without  evidence of hemodynamically significant valvular heart disease.   Risk Assessment/Calculations {Does this patient have ATRIAL FIBRILLATION?:[id number]} No BP recorded.  {Refresh Note OR Click here to enter BP  :1}***       Physical Exam VS:  There were no vitals taken for this visit.       Wt Readings from Last 3 Encounters:  04/26/24 166 lb (75.3 kg)  10/27/23 165 lb 9.6 oz (75.1 kg)  10/19/23 165 lb (74.8 kg)    GEN: Well nourished, well developed in no acute distress NECK: No JVD; No carotid bruits CARDIAC: ***RRR, no murmurs, rubs, gallops RESPIRATORY:  Clear to auscultation without rales, wheezing or rhonchi  ABDOMEN: Soft, non-tender, non-distended EXTREMITIES:  No edema; No deformity   ASSESSMENT AND PLAN Cryptogenic stroke Aortic atherosclerosis HTN HLD DM MR Carotid bruit    {Are you ordering a CV Procedure (e.g. stress test, cath, DCCV, TEE, etc)?   Press F2        :[id number]}  Dispo: ***  Signed, Orren LOISE Fabry, PA-C

## 2024-06-23 ENCOUNTER — Ambulatory Visit (INDEPENDENT_AMBULATORY_CARE_PROVIDER_SITE_OTHER)

## 2024-06-23 ENCOUNTER — Ambulatory Visit: Payer: Self-pay | Admitting: Cardiology

## 2024-06-23 DIAGNOSIS — I639 Cerebral infarction, unspecified: Secondary | ICD-10-CM | POA: Diagnosis not present

## 2024-06-23 LAB — CUP PACEART REMOTE DEVICE CHECK
Date Time Interrogation Session: 20250813234350
Implantable Pulse Generator Implant Date: 20241030

## 2024-06-24 ENCOUNTER — Ambulatory Visit: Attending: Physician Assistant | Admitting: Physician Assistant

## 2024-06-24 VITALS — BP 148/76 | HR 62 | Ht 64.0 in | Wt 167.0 lb

## 2024-06-24 DIAGNOSIS — I10 Essential (primary) hypertension: Secondary | ICD-10-CM | POA: Diagnosis not present

## 2024-06-24 DIAGNOSIS — E782 Mixed hyperlipidemia: Secondary | ICD-10-CM | POA: Diagnosis not present

## 2024-06-24 DIAGNOSIS — I700 Atherosclerosis of aorta: Secondary | ICD-10-CM

## 2024-06-24 DIAGNOSIS — I639 Cerebral infarction, unspecified: Secondary | ICD-10-CM | POA: Diagnosis not present

## 2024-06-24 DIAGNOSIS — E118 Type 2 diabetes mellitus with unspecified complications: Secondary | ICD-10-CM

## 2024-06-24 DIAGNOSIS — Z794 Long term (current) use of insulin: Secondary | ICD-10-CM | POA: Diagnosis not present

## 2024-06-24 DIAGNOSIS — R0989 Other specified symptoms and signs involving the circulatory and respiratory systems: Secondary | ICD-10-CM

## 2024-06-24 NOTE — Patient Instructions (Signed)
 Medication Instructions:  Your physician recommends that you continue on your current medications as directed. Please refer to the Current Medication list given to you today.  *If you need a refill on your cardiac medications before your next appointment, please call your pharmacy*  Lab Work: NONE ordered at this time of appointment   Testing/Procedures: NONE ordered at this time of appointment   Follow-Up: At [facility], you and your health needs are our priority.  As part of our continuing mission to provide you with exceptional heart care, our providers are all part of one team.  This team includes your primary Cardiologist (physician) and Advanced Practice Providers or APPs (Physician Assistants and Nurse Practitioners) who all work together to provide you with the care you need, when you need it.  Your next appointment:   Dr. Cindie (3 months) Dr. Francyne (1 year)  Provider:   OLE ONEIDA CINDIE, MD    We recommend signing up for the patient portal called MyChart.  Sign up information is provided on this After Visit Summary.  MyChart is used to connect with patients for Virtual Visits (Telemedicine).  Patients are able to view lab/test results, encounter notes, upcoming appointments, etc.  Non-urgent messages can be sent to your provider as well.   To learn more about what you can do with MyChart, go to [email or web address].   Other Instructions Please have Fasting lipid panel & HFP drawn with your Primary Care Physician.

## 2024-07-25 ENCOUNTER — Ambulatory Visit (INDEPENDENT_AMBULATORY_CARE_PROVIDER_SITE_OTHER)

## 2024-07-25 DIAGNOSIS — I639 Cerebral infarction, unspecified: Secondary | ICD-10-CM | POA: Diagnosis not present

## 2024-07-26 DIAGNOSIS — R809 Proteinuria, unspecified: Secondary | ICD-10-CM | POA: Diagnosis not present

## 2024-07-26 DIAGNOSIS — E118 Type 2 diabetes mellitus with unspecified complications: Secondary | ICD-10-CM | POA: Diagnosis not present

## 2024-07-26 DIAGNOSIS — E1129 Type 2 diabetes mellitus with other diabetic kidney complication: Secondary | ICD-10-CM | POA: Diagnosis not present

## 2024-07-26 DIAGNOSIS — Z794 Long term (current) use of insulin: Secondary | ICD-10-CM | POA: Diagnosis not present

## 2024-07-26 LAB — CUP PACEART REMOTE DEVICE CHECK
Date Time Interrogation Session: 20250914235759
Implantable Pulse Generator Implant Date: 20241030

## 2024-07-27 DIAGNOSIS — E113213 Type 2 diabetes mellitus with mild nonproliferative diabetic retinopathy with macular edema, bilateral: Secondary | ICD-10-CM | POA: Diagnosis not present

## 2024-07-27 DIAGNOSIS — Z961 Presence of intraocular lens: Secondary | ICD-10-CM | POA: Diagnosis not present

## 2024-07-27 DIAGNOSIS — H04123 Dry eye syndrome of bilateral lacrimal glands: Secondary | ICD-10-CM | POA: Diagnosis not present

## 2024-07-28 ENCOUNTER — Ambulatory Visit: Payer: Self-pay | Admitting: Cardiology

## 2024-07-30 NOTE — Progress Notes (Signed)
 Remote Loop Recorder Transmission

## 2024-08-02 DIAGNOSIS — E118 Type 2 diabetes mellitus with unspecified complications: Secondary | ICD-10-CM | POA: Diagnosis not present

## 2024-08-02 DIAGNOSIS — K219 Gastro-esophageal reflux disease without esophagitis: Secondary | ICD-10-CM | POA: Diagnosis not present

## 2024-08-02 DIAGNOSIS — Z79899 Other long term (current) drug therapy: Secondary | ICD-10-CM | POA: Diagnosis not present

## 2024-08-02 DIAGNOSIS — E782 Mixed hyperlipidemia: Secondary | ICD-10-CM | POA: Diagnosis not present

## 2024-08-02 DIAGNOSIS — M810 Age-related osteoporosis without current pathological fracture: Secondary | ICD-10-CM | POA: Diagnosis not present

## 2024-08-02 DIAGNOSIS — I10 Essential (primary) hypertension: Secondary | ICD-10-CM | POA: Diagnosis not present

## 2024-08-03 NOTE — Progress Notes (Signed)
 Remote Loop Recorder Transmission

## 2024-08-17 NOTE — Progress Notes (Signed)
 Remote Loop Recorder Transmission

## 2024-08-18 DIAGNOSIS — M8588 Other specified disorders of bone density and structure, other site: Secondary | ICD-10-CM | POA: Diagnosis not present

## 2024-08-24 ENCOUNTER — Ambulatory Visit

## 2024-08-24 DIAGNOSIS — I639 Cerebral infarction, unspecified: Secondary | ICD-10-CM | POA: Diagnosis not present

## 2024-08-25 ENCOUNTER — Ambulatory Visit: Payer: Self-pay | Admitting: Cardiology

## 2024-08-25 ENCOUNTER — Encounter

## 2024-08-25 LAB — CUP PACEART REMOTE DEVICE CHECK
Date Time Interrogation Session: 20251014232501
Implantable Pulse Generator Implant Date: 20241030

## 2024-08-26 DIAGNOSIS — R809 Proteinuria, unspecified: Secondary | ICD-10-CM | POA: Diagnosis not present

## 2024-08-26 DIAGNOSIS — M810 Age-related osteoporosis without current pathological fracture: Secondary | ICD-10-CM | POA: Diagnosis not present

## 2024-08-26 DIAGNOSIS — Z794 Long term (current) use of insulin: Secondary | ICD-10-CM | POA: Diagnosis not present

## 2024-08-26 DIAGNOSIS — E118 Type 2 diabetes mellitus with unspecified complications: Secondary | ICD-10-CM | POA: Diagnosis not present

## 2024-08-26 DIAGNOSIS — I10 Essential (primary) hypertension: Secondary | ICD-10-CM | POA: Diagnosis not present

## 2024-08-26 DIAGNOSIS — E1129 Type 2 diabetes mellitus with other diabetic kidney complication: Secondary | ICD-10-CM | POA: Diagnosis not present

## 2024-08-26 DIAGNOSIS — E113293 Type 2 diabetes mellitus with mild nonproliferative diabetic retinopathy without macular edema, bilateral: Secondary | ICD-10-CM | POA: Diagnosis not present

## 2024-08-26 DIAGNOSIS — Z79899 Other long term (current) drug therapy: Secondary | ICD-10-CM | POA: Diagnosis not present

## 2024-08-26 DIAGNOSIS — E11649 Type 2 diabetes mellitus with hypoglycemia without coma: Secondary | ICD-10-CM | POA: Diagnosis not present

## 2024-08-26 DIAGNOSIS — E782 Mixed hyperlipidemia: Secondary | ICD-10-CM | POA: Diagnosis not present

## 2024-08-26 NOTE — Progress Notes (Signed)
 Molly [name]                                          MRN: [id number]   08/26/2024   The [facility] Quality Team Specialist reviewed this patient medical record for the purposes of chart review for care gap closure. The following were reviewed: abstraction for care gap closure-controlling blood pressure and glycemic status assessment.    [facility]BCI Quality Team

## 2024-08-26 NOTE — Progress Notes (Signed)
 Remote Loop Recorder Transmission

## 2024-09-24 ENCOUNTER — Encounter

## 2024-09-25 ENCOUNTER — Ambulatory Visit: Attending: Cardiology

## 2024-09-25 DIAGNOSIS — I639 Cerebral infarction, unspecified: Secondary | ICD-10-CM | POA: Diagnosis not present

## 2024-09-26 ENCOUNTER — Encounter

## 2024-09-26 LAB — CUP PACEART REMOTE DEVICE CHECK
Date Time Interrogation Session: 20251115232817
Implantable Pulse Generator Implant Date: 20241030

## 2024-09-27 NOTE — Progress Notes (Signed)
 Remote Loop Recorder Transmission

## 2024-09-27 NOTE — Progress Notes (Unsigned)
  Electrophysiology Office Follow up Visit Note:    Date:  09/28/2024   ID:  Molly [name], DOB [birth year], MRN [id number]  PCP:  Molly Reyes BIRCH, MD  [facility] Cardiologist:  Molly ONEIDA HOLTS, MD  [facility] Electrophysiologist:  None    Interval History:     Molly [name] is a 83 y.o. female who presents for a follow up visit.   She has a loop recorder in place after a stroke in 2024.  She presents for follow-up.  She saw Molly [name] in August of this year.  Loop recorder showed rare PVCs. She is doing well today.  She is with her husband today in clinic.  No recent palpitations.       Past medical, surgical, social and family history were reviewed.  ROS:   Please see the history of present illness.    All other systems reviewed and are negative.  EKGs/Labs/Other Studies Reviewed:    The following studies were reviewed today:          Physical Exam:    VS:  BP 134/73 (BP Location: Left Arm, Patient Position: Sitting, Cuff Size: Normal)   Pulse 68   Ht 5' 4 (1.626 m)   Wt 162 lb (73.5 kg)   SpO2 96%   BMI 27.81 kg/m     Wt Readings from Last 3 Encounters:  09/28/24 162 lb (73.5 kg)  06/24/24 167 lb (75.8 kg)  04/26/24 166 lb (75.3 kg)     GEN: no distress CARD: RRR, No MRG RESP: No IWOB. CTAB.      ASSESSMENT:    1. Cerebrovascular accident (CVA), unspecified mechanism (HCC)   2. Palpitations   3. Essential hypertension    PLAN:    In order of problems listed above:  #Palpitations Continue metoprolol   #History of stroke No sustained atrial fibrillation on loop recorder monitoring  #Hypertension At goal today.  Recommend checking blood pressures 1-2 times per week at home and recording the values.  Recommend bringing these recordings to the primary care physician.  I discussed my upcoming departure from Molly [name] during today's clinic appointment.  She will continue to follow-up with one of my partners moving forward.  Follow-up as  needed   Signed, Molly Holts, MD, [facility], [facility] 09/28/2024 2:51 PM    Electrophysiology [facility]

## 2024-09-28 ENCOUNTER — Encounter: Payer: Self-pay | Admitting: Cardiology

## 2024-09-28 ENCOUNTER — Ambulatory Visit: Payer: Self-pay | Admitting: Cardiology

## 2024-09-28 ENCOUNTER — Ambulatory Visit: Attending: Cardiology | Admitting: Cardiology

## 2024-09-28 VITALS — BP 134/73 | HR 68 | Ht 64.0 in | Wt 162.0 lb

## 2024-09-28 DIAGNOSIS — I639 Cerebral infarction, unspecified: Secondary | ICD-10-CM | POA: Diagnosis not present

## 2024-09-28 DIAGNOSIS — R002 Palpitations: Secondary | ICD-10-CM | POA: Diagnosis not present

## 2024-09-28 DIAGNOSIS — I10 Essential (primary) hypertension: Secondary | ICD-10-CM | POA: Diagnosis not present

## 2024-09-28 NOTE — Patient Instructions (Signed)
 Medication Instructions:  Your physician recommends that you continue on your current medications as directed. Please refer to the Current Medication list given to you today.  *If you need a refill on your cardiac medications before your next appointment, please call your pharmacy*  Follow-Up: At [facility], you and your health needs are our priority.  As part of our continuing mission to provide you with exceptional heart care, our providers are all part of one team.  This team includes your primary Cardiologist (physician) and Advanced Practice Providers or APPs (Physician Assistants and Nurse Practitioners) who all work together to provide you with the care you need, when you need it.  Your next appointment:   As needed with EP

## 2024-10-25 ENCOUNTER — Encounter

## 2024-10-26 ENCOUNTER — Ambulatory Visit

## 2024-10-26 DIAGNOSIS — I639 Cerebral infarction, unspecified: Secondary | ICD-10-CM

## 2024-10-27 ENCOUNTER — Ambulatory Visit: Payer: Self-pay | Admitting: Cardiology

## 2024-10-27 ENCOUNTER — Encounter

## 2024-10-27 LAB — CUP PACEART REMOTE DEVICE CHECK
Date Time Interrogation Session: 20251216233834
Implantable Pulse Generator Implant Date: 20241030

## 2024-10-27 NOTE — Progress Notes (Signed)
 Remote Loop Recorder Transmission

## 2024-11-25 ENCOUNTER — Encounter

## 2024-11-26 ENCOUNTER — Ambulatory Visit: Attending: Cardiology

## 2024-11-26 DIAGNOSIS — I639 Cerebral infarction, unspecified: Secondary | ICD-10-CM | POA: Diagnosis not present

## 2024-11-28 ENCOUNTER — Encounter

## 2024-11-29 LAB — CUP PACEART REMOTE DEVICE CHECK
Date Time Interrogation Session: 20260116232329
Implantable Pulse Generator Implant Date: 20241030

## 2024-12-01 ENCOUNTER — Ambulatory Visit: Payer: Self-pay | Admitting: Cardiology

## 2024-12-01 NOTE — Progress Notes (Signed)
 Remote Loop Recorder Transmission

## 2024-12-02 NOTE — Progress Notes (Unsigned)
 "  Virtual Visit via Video Note:   Consent was obtained for video visit:  Yes.   Answered questions that patient had about telehealth interaction:  Yes.   I discussed the limitations, risks, security and privacy concerns of performing an evaluation and management service by telemedicine. I also discussed with the patient that there may be a patient responsible charge related to this service. The patient expressed understanding and agreed to proceed.  Pt location: Home Physician Location: office Name of referring provider:  Auston Reyes BIRCH, MD I connected with Molly [name] at patients initiation/request on 12/05/2024 at  1:50 PM EST by video enabled telemedicine application and verified that I am speaking with the correct person using two identifiers. Pt MRN:  [id number] Pt DOB:  04-02-1941 Video Participants:  Molly [name]  Assessment/Plan:   Left hemispheric territory infarcts, appears embolic of unknown source but suspicious for cardiac Hypertension Hyperlipidemia Type 2 diabetes mellitus Dizziness - I am not convinced that the third admission earlier this month was a stroke.  MRI was negative but she did not exhibit any clear lateralizing symptoms like the first 2 events - nonspecific unsteadiness, generalized weakness and dizziness.  It may have been a peripheral vestibulopathy.     Secondary stroke prevention as managed by PCP/cardiology: ASA 81mg  daily Statin.  LDL goal less than 70 Normotensive blood pressure Hgb A1c goal less than 7 Monitor for a fib via loop recorder Mediterranean diet Vestibular rehab as ordered by PCP. Follow up 6 months.   Subjective:  Molly [name] is an 84 year old right-handed with HTN, HLD, non-alcoholic fatty liver disease, DM 2, IBS and history of basal cell carcinoma who follows up for strokes.  She is accompanied by her husband who supplements history.  UPDATE: Current medications:  ASA 81mg  daily, rosuvastatin , lisinopril , metoprolol  tartrate,  metformin , glipizide , insulin   Doing well.    Loop recorder hasn't detected a fib thus far  05/23/2024 Labs:  LDL 28 08/26/2024 Labs:  Hgb A1c 7.4  HISTORY: Patient has had recurrent acute strokes since October 2024.  On 10/2/20241, she developed sudden onset right sided facial droop, right sided weakness and aphasia.  She presented to [facility] where she received TNK.  MRI of brain revealed patchy acute infarcts within the left MCA and bilateral ACA territories.  CTA head and neck revealed distal left M1 and proximal superior and inferior M2 occlusions.  Underwent mechanical thrombectomy resulting in patent left MCA.  2D echo showed EF 60-65% with negative bubble study.  LDL 63 and Hgb A1c was 6.7.  She was not on antithrombotic therapy and was discharged on ASA and Plavix  for 3 months followed by ASA alone.  Due to involvement of bilateral ACA territories.  Discharged on 10/27 but next day she had recurrence of aphasia but also endorsed headache, left sided arm tingling and chest pain.  Admitted to [facility].  MRI of brain showed news acute infarct at the left centrum semiovale.  EP consulted and underwent loop recorder implant.  EEG showed cortical dysfunction in the left frontotemporal region likely secondary to underlying stroke but study was negative for epileptiform discharges.  Maintained on DAPT and changed from simvastatin  to rosuvastatin .  Following discharge on 10/30, she experienced generalized weakness.  She started feeling more dizzy.  Outpatient CT head on 11/29 demonstrated new lacunar infarct of the left caudate head.  Readmitted to [facility] on 12/2 for further evaluation.  MRI of brain showed evidence of known subacute infarcts in  the left caudate tail and left frontal lobe but no evidence of new infarcts.  TEE on 12/4 revealed EF 55-60% and severely dilated left atrium with no evidence of left atrial appendate thrombus, significant valvular disease or PFO.  She sill feels a little dizzy and will be  starting physical therapy.   08/31/2023 CTA HEAD & NECK:  1. The distal left M1 segment/proximal superior and inferior M2 segments are now occluded for a short-segment with diminished collateral flow throughout the distal MCA territory. 2. No evidence of evolved infarct in the left MCA distribution or intracranial hemorrhage. 09/01/2023 MRI BRAIN WO:  Acute left MCA and ACA territory infarcts involving the left frontal lobe, left parietal lobe, and the body and head of the caudate on the left. No hemorrhage. 09/01/2023 CTA HEAD & NECK:  1. No intracranial arterial occlusion or high-grade stenosis. The left middle cerebral artery is now patent. 2. Unchanged appearance of the left vertebral artery which terminates in PICA and may be occluded proximally. 09/07/2023 MRI BRAIN WO: 1. Much of the previously seen abnormal diffusion restriction has resolved. Persistent diffusion restriction of the left precentral gyrus, caudate tail and medial frontal gyrus. 2. New focus of acute ischemia at the left centrum semiovale. 3. Petechial blood products at the superior left hemisphere  09/07/2023 CTA HEAD & NECK:  1. No large vessel occlusion or hemodynamically significant stenosis in the head or neck. 2. Mild atherosclerosis of the carotid arteries. 10/12/2023 MRI BRAIN WO:  1. Further diminishment in diffusion restriction in the left caudate tail and left frontal lobe, with resolution of the previously noted ADC correlates, compatible with subacute infarcts. 2. No evidence of additional acute or subacute infarct that was not present on the prior exam. 3. Redemonstrated petechial hemorrhage in the superior left medial frontal lobe. No new hemosiderin deposition.  Past Medical History: Past Medical History:  Diagnosis Date   Arrhythmia    Arthritis    Basal cell carcinoma    right neck, right forehead - treated in the past   Cancer (HCC)    skin   CMV (cytomegalovirus infection) (HCC)    Colon polyps     Diabetes mellitus, type 2 (HCC)    Duodenitis    Dysrhythmia    Fatty liver disease, nonalcoholic    FH: colonic polyps    hx of-adenomatous   GERD (gastroesophageal reflux disease)    Heart murmur    dx'd in 1960s. followed by PCP   Hemorrhoid .   Hyperlipidemia    Hypertension    IBS (irritable bowel syndrome)    Osteoporosis    Pancreatitis    drug induced    Medications: Outpatient Encounter Medications as of 12/05/2024  Medication Sig   aspirin  EC 81 MG tablet Take 1 tablet (81 mg total) by mouth daily. Swallow whole.   Biotin 2500 MCG CAPS Take 2,500 mcg by mouth 2 (two) times daily.   CALCIUM  PO Take 750 mg by mouth 2 (two) times daily.   Cholecalciferol (VITAMIN D  PO) Take 500 Units by mouth 2 (two) times daily.   Cyanocobalamin  (VITAMIN B 12 PO) Take 1 tablet by mouth daily.   diazepam (VALIUM) 5 MG tablet Take 2.5 mg by mouth as needed for anxiety.   glipiZIDE  (GLUCOTROL  XL) 5 MG 24 hr tablet Take 5-10 mg by mouth See admin instructions. Take 10 mg by mouth in the morning and 5 mg in the evening   glucose blood test strip 1 each by Other route  as directed. Use as instructed   HYDROcodone-acetaminophen  (NORCO/VICODIN) 5-325 MG tablet Take 1 tablet by mouth every 6 (six) hours as needed for moderate pain (pain score 4-6).   Insulin  Aspart FlexPen (NOVOLOG ) 100 UNIT/ML 10 Units at bedtime.   insulin  glargine (LANTUS  SOLOSTAR) 100 UNIT/ML Solostar Pen Inject 35 Units into the skin daily. (Patient taking differently: Inject 40 Units into the skin daily.)   isosorbide mononitrate (IMDUR) 30 MG 24 hr tablet Take 15 mg by mouth daily.   ketoconazole  (NIZORAL ) 2 % cream Apply to skin folds once daily, twice daily with flares.   Lancet Devices (ACCU-CHEK SOFTCLIX) lancets 1 each by Other route as needed. Use as instructed   lisinopril  (ZESTRIL ) 20 MG tablet Take 20 mg by mouth daily.   magnesium  oxide (MAG-OX) 400 MG tablet Take 400 mg by mouth daily.   metFORMIN  (GLUCOPHAGE -XR)  500 MG 24 hr tablet Take 1,000 mg by mouth 2 (two) times daily.   metoprolol  tartrate (LOPRESSOR ) 25 MG tablet Take 1 tablet (25 mg total) by mouth 2 (two) times daily. (Patient taking differently: Take 12.5 mg by mouth 2 (two) times daily.)   montelukast (SINGULAIR) 10 MG tablet Take 10 mg by mouth at bedtime.   pantoprazole  (PROTONIX ) 40 MG tablet Take 40 mg by mouth 2 (two) times daily.   pyridOXINE (B-6) 50 MG tablet Take 50 mg by mouth daily.   rosuvastatin  (CRESTOR ) 40 MG tablet Take 1 tablet (40 mg total) by mouth daily.   No facility-administered encounter medications on file as of 12/05/2024.    Allergies: Allergies[1]  Family History: Family History  Problem Relation Age of Onset   Heart failure Mother    Coronary artery disease Mother    Heart attack Mother 18   Diabetes Mother    Heart failure Father    Coronary artery disease Father    Breast cancer Neg Hx     Observations/Objective:   No acute distress.  Alert and oriented.  Speech fluent and not dysarthric.  Language intact.  Eyes orthophoric on primary gaze.  Face symmetric.   Follow up Instructions:      -I discussed the assessment and treatment plan with the patient. The patient was provided an opportunity to ask questions and all were answered. The patient agreed with the plan and demonstrated an understanding of the instructions.   The patient was advised to call back or seek an in-person evaluation if the symptoms worsen or if the condition fails to improve as anticipated.   Juliene Lamar Dunnings, DO     [1]  Allergies Allergen Reactions   Alendronate Nausea Only   Codeine Nausea Only   Prevacid [Lansoprazole] Other (See Comments)    hurts stomach   Sulfonamide Derivatives     Childhood reaction (doesn't remember)   Adhesive [Tape] Rash    Blisters if on too long   "

## 2024-12-05 ENCOUNTER — Telehealth: Admitting: Neurology

## 2024-12-26 ENCOUNTER — Encounter

## 2024-12-27 ENCOUNTER — Ambulatory Visit

## 2024-12-29 ENCOUNTER — Encounter

## 2025-01-26 ENCOUNTER — Encounter

## 2025-01-27 ENCOUNTER — Ambulatory Visit

## 2025-01-30 ENCOUNTER — Encounter

## 2025-02-26 ENCOUNTER — Encounter

## 2025-02-27 ENCOUNTER — Ambulatory Visit

## 2025-03-02 ENCOUNTER — Encounter
